# Patient Record
Sex: Female | Born: 1935
Health system: Southern US, Community
[De-identification: ages and names within clinical notes are randomized; demographics above are authoritative.]

## PROBLEM LIST (undated history)

## (undated) DIAGNOSIS — I4821 Permanent atrial fibrillation: Secondary | ICD-10-CM

## (undated) DIAGNOSIS — H269 Unspecified cataract: Secondary | ICD-10-CM

## (undated) DIAGNOSIS — I4891 Unspecified atrial fibrillation: Secondary | ICD-10-CM

## (undated) DIAGNOSIS — E039 Hypothyroidism, unspecified: Secondary | ICD-10-CM

## (undated) DIAGNOSIS — M1990 Unspecified osteoarthritis, unspecified site: Secondary | ICD-10-CM

## (undated) DIAGNOSIS — E079 Disorder of thyroid, unspecified: Secondary | ICD-10-CM

## (undated) HISTORY — DX: Hypothyroidism, unspecified: E03.9

## (undated) HISTORY — DX: Unspecified atrial fibrillation: I48.91

## (undated) HISTORY — DX: Unspecified cataract: H26.9

## (undated) HISTORY — DX: Unspecified osteoarthritis, unspecified site: M19.90

## (undated) HISTORY — DX: Disorder of thyroid, unspecified: E07.9

## (undated) HISTORY — DX: Permanent atrial fibrillation: I48.21

## (undated) HISTORY — PX: JOINT REPLACEMENT: SHX530

## (undated) HISTORY — PX: OTHER SURGICAL HISTORY: SHX169

---

## 1998-06-11 ENCOUNTER — Encounter: Payer: Self-pay | Admitting: Internal Medicine

## 1999-05-27 ENCOUNTER — Encounter: Admission: RE | Admit: 1999-05-27 | Discharge: 1999-05-27 | Payer: Self-pay | Admitting: Family Medicine

## 1999-05-27 ENCOUNTER — Encounter: Payer: Self-pay | Admitting: Family Medicine

## 2000-04-04 ENCOUNTER — Encounter: Payer: Self-pay | Admitting: Orthopedic Surgery

## 2000-04-04 ENCOUNTER — Encounter: Admission: RE | Admit: 2000-04-04 | Discharge: 2000-04-04 | Payer: Self-pay | Admitting: Orthopedic Surgery

## 2000-04-06 ENCOUNTER — Ambulatory Visit (HOSPITAL_BASED_OUTPATIENT_CLINIC_OR_DEPARTMENT_OTHER): Admission: RE | Admit: 2000-04-06 | Discharge: 2000-04-06 | Payer: Self-pay | Admitting: Orthopedic Surgery

## 2000-05-22 HISTORY — PX: KNEE SURGERY: SHX244

## 2000-06-27 ENCOUNTER — Encounter: Admission: RE | Admit: 2000-06-27 | Discharge: 2000-06-27 | Payer: Self-pay | Admitting: *Deleted

## 2000-07-19 ENCOUNTER — Encounter: Payer: Self-pay | Admitting: Orthopedic Surgery

## 2000-07-20 ENCOUNTER — Encounter: Payer: Self-pay | Admitting: Orthopedic Surgery

## 2000-07-20 ENCOUNTER — Ambulatory Visit (HOSPITAL_COMMUNITY): Admission: RE | Admit: 2000-07-20 | Discharge: 2000-07-20 | Payer: Self-pay | Admitting: Orthopedic Surgery

## 2000-07-23 ENCOUNTER — Inpatient Hospital Stay (HOSPITAL_COMMUNITY): Admission: RE | Admit: 2000-07-23 | Discharge: 2000-07-27 | Payer: Self-pay | Admitting: Orthopedic Surgery

## 2001-05-22 HISTORY — PX: TOTAL SHOULDER REPLACEMENT: SUR1217

## 2001-05-22 LAB — HM COLONOSCOPY

## 2001-06-14 ENCOUNTER — Inpatient Hospital Stay (HOSPITAL_COMMUNITY): Admission: RE | Admit: 2001-06-14 | Discharge: 2001-06-17 | Payer: Self-pay | Admitting: Orthopedic Surgery

## 2001-06-14 ENCOUNTER — Encounter: Payer: Self-pay | Admitting: Orthopedic Surgery

## 2005-05-03 ENCOUNTER — Ambulatory Visit: Payer: Self-pay | Admitting: Gastroenterology

## 2005-05-10 ENCOUNTER — Encounter (INDEPENDENT_AMBULATORY_CARE_PROVIDER_SITE_OTHER): Payer: Self-pay | Admitting: Specialist

## 2005-05-10 ENCOUNTER — Ambulatory Visit: Payer: Self-pay | Admitting: Gastroenterology

## 2005-05-22 HISTORY — PX: TOTAL SHOULDER REPLACEMENT: SUR1217

## 2005-12-04 ENCOUNTER — Inpatient Hospital Stay (HOSPITAL_COMMUNITY): Admission: RE | Admit: 2005-12-04 | Discharge: 2005-12-06 | Payer: Self-pay | Admitting: Orthopedic Surgery

## 2006-05-22 HISTORY — PX: WRIST SURGERY: SHX841

## 2007-03-13 ENCOUNTER — Ambulatory Visit (HOSPITAL_BASED_OUTPATIENT_CLINIC_OR_DEPARTMENT_OTHER): Admission: RE | Admit: 2007-03-13 | Discharge: 2007-03-13 | Payer: Self-pay | Admitting: Orthopedic Surgery

## 2007-05-08 ENCOUNTER — Ambulatory Visit (HOSPITAL_BASED_OUTPATIENT_CLINIC_OR_DEPARTMENT_OTHER): Admission: RE | Admit: 2007-05-08 | Discharge: 2007-05-08 | Payer: Self-pay | Admitting: Orthopedic Surgery

## 2008-05-22 HISTORY — PX: CATARACT EXTRACTION, BILATERAL: SHX1313

## 2009-05-22 HISTORY — PX: TOTAL HIP ARTHROPLASTY: SHX124

## 2009-05-22 HISTORY — PX: OTHER SURGICAL HISTORY: SHX169

## 2009-09-29 ENCOUNTER — Inpatient Hospital Stay (HOSPITAL_COMMUNITY): Admission: RE | Admit: 2009-09-29 | Discharge: 2009-10-02 | Payer: Self-pay | Admitting: Orthopedic Surgery

## 2010-01-05 ENCOUNTER — Inpatient Hospital Stay (HOSPITAL_COMMUNITY): Admission: RE | Admit: 2010-01-05 | Discharge: 2010-01-08 | Payer: Self-pay | Admitting: Orthopedic Surgery

## 2010-08-04 LAB — CBC
HCT: 29.4 % — ABNORMAL LOW (ref 36.0–46.0)
Hemoglobin: 9.3 g/dL — ABNORMAL LOW (ref 12.0–15.0)
Hemoglobin: 9.5 g/dL — ABNORMAL LOW (ref 12.0–15.0)
MCH: 28.2 pg (ref 26.0–34.0)
MCV: 85.1 fL (ref 78.0–100.0)
MCV: 86 fL (ref 78.0–100.0)
MCV: 87.3 fL (ref 78.0–100.0)
Platelets: 267 10*3/uL (ref 150–400)
Platelets: 281 10*3/uL (ref 150–400)
RBC: 3.42 MIL/uL — ABNORMAL LOW (ref 3.87–5.11)
RDW: 14.4 % (ref 11.5–15.5)
WBC: 9.6 10*3/uL (ref 4.0–10.5)
WBC: 9.8 10*3/uL (ref 4.0–10.5)

## 2010-08-04 LAB — BASIC METABOLIC PANEL
CO2: 27 mEq/L (ref 19–32)
Calcium: 8.5 mg/dL (ref 8.4–10.5)
Calcium: 9.1 mg/dL (ref 8.4–10.5)
Creatinine, Ser: 0.87 mg/dL (ref 0.4–1.2)
GFR calc Af Amer: >60 mL/min (ref >60)
Glucose, Bld: 120 mg/dL — ABNORMAL HIGH (ref 70–99)
Glucose, Bld: 125 mg/dL — ABNORMAL HIGH (ref 70–99)
Potassium: 4.2 mEq/L (ref 3.5–5.1)
Sodium: 133 mEq/L — ABNORMAL LOW (ref 135–145)

## 2010-08-04 LAB — PROTIME-INR
INR: 1.14 (ref 0.00–1.49)
INR: 1.38 (ref 0.00–1.49)
Prothrombin Time: 20.4 seconds — ABNORMAL HIGH (ref 11.6–15.2)

## 2010-08-05 LAB — DIFFERENTIAL
Basophils Absolute: 0.1 10*3/uL (ref 0.0–0.1)
Basophils Relative: 1 % (ref 0–1)
Lymphs Abs: 1.6 10*3/uL (ref 0.7–4.0)
Monocytes Relative: 13 % — ABNORMAL HIGH (ref 3–12)
Neutro Abs: 5.1 10*3/uL (ref 1.7–7.7)

## 2010-08-05 LAB — TYPE AND SCREEN: Antibody Screen: NEGATIVE

## 2010-08-05 LAB — COMPREHENSIVE METABOLIC PANEL
AST: 18 U/L (ref 0–37)
Albumin: 3.8 g/dL (ref 3.5–5.2)
Alkaline Phosphatase: 85 U/L (ref 39–117)
BUN: 13 mg/dL (ref 6–23)
Calcium: 9.8 mg/dL (ref 8.4–10.5)
GFR calc Af Amer: >60 mL/min (ref >60)
Glucose, Bld: 95 mg/dL (ref 70–99)
Sodium: 135 mEq/L (ref 135–145)
Total Protein: 6.7 g/dL (ref 6.0–8.3)

## 2010-08-05 LAB — CBC
HCT: 42.6 % (ref 36.0–46.0)
MCH: 28.3 pg (ref 26.0–34.0)
MCHC: 32.4 g/dL (ref 30.0–36.0)

## 2010-08-05 LAB — SURGICAL PCR SCREEN: MRSA, PCR: NEGATIVE

## 2010-08-05 LAB — URINE MICROSCOPIC-ADD ON

## 2010-08-05 LAB — URINALYSIS, ROUTINE W REFLEX MICROSCOPIC

## 2010-08-05 LAB — PROTIME-INR: Prothrombin Time: 12.9 seconds (ref 11.6–15.2)

## 2010-08-09 LAB — BASIC METABOLIC PANEL
BUN: 5 mg/dL — ABNORMAL LOW (ref 6–23)
CO2: 29 mEq/L (ref 19–32)
CO2: 30 mEq/L (ref 19–32)
Calcium: 8.3 mg/dL — ABNORMAL LOW (ref 8.4–10.5)
Calcium: 8.8 mg/dL (ref 8.4–10.5)
Chloride: 100 mEq/L (ref 96–112)
Creatinine, Ser: 0.63 mg/dL (ref 0.4–1.2)
Creatinine, Ser: 0.66 mg/dL (ref 0.4–1.2)
Creatinine, Ser: 0.77 mg/dL (ref 0.4–1.2)
GFR calc Af Amer: >60 mL/min (ref >60)
GFR calc Af Amer: >60 mL/min (ref >60)
GFR calc Af Amer: >60 mL/min (ref >60)
GFR calc non Af Amer: >60 mL/min (ref >60)
GFR calc non Af Amer: >60 mL/min (ref >60)
Glucose, Bld: 134 mg/dL — ABNORMAL HIGH (ref 70–99)
Potassium: 3.8 mEq/L (ref 3.5–5.1)
Sodium: 131 mEq/L — ABNORMAL LOW (ref 135–145)
Sodium: 135 mEq/L (ref 135–145)
Sodium: 135 mEq/L (ref 135–145)

## 2010-08-09 LAB — CBC
HCT: 25.9 % — ABNORMAL LOW (ref 36.0–46.0)
HCT: 28.8 % — ABNORMAL LOW (ref 36.0–46.0)
HCT: 31.4 % — ABNORMAL LOW (ref 36.0–46.0)
HCT: 41.6 % (ref 36.0–46.0)
Hemoglobin: 10.5 g/dL — ABNORMAL LOW (ref 12.0–15.0)
Hemoglobin: 14.2 g/dL (ref 12.0–15.0)
Hemoglobin: 8.8 g/dL — ABNORMAL LOW (ref 12.0–15.0)
Hemoglobin: 9.8 g/dL — ABNORMAL LOW (ref 12.0–15.0)
MCHC: 33.6 g/dL (ref 30.0–36.0)
MCHC: 33.8 g/dL (ref 30.0–36.0)
MCHC: 34.2 g/dL (ref 30.0–36.0)
MCHC: 34.2 g/dL (ref 30.0–36.0)
MCV: 92 fL (ref 78.0–100.0)
MCV: 92.8 fL (ref 78.0–100.0)
MCV: 93 fL (ref 78.0–100.0)
MCV: 93.2 fL (ref 78.0–100.0)
Platelets: 258 10*3/uL (ref 150–400)
Platelets: 267 10*3/uL (ref 150–400)
Platelets: 300 10*3/uL (ref 150–400)
Platelets: 364 10*3/uL (ref 150–400)
RBC: 2.79 MIL/uL — ABNORMAL LOW (ref 3.87–5.11)
RBC: 3.09 MIL/uL — ABNORMAL LOW (ref 3.87–5.11)
RBC: 3.38 MIL/uL — ABNORMAL LOW (ref 3.87–5.11)
RBC: 4.52 MIL/uL (ref 3.87–5.11)
RDW: 13.3 % (ref 11.5–15.5)
RDW: 13.3 % (ref 11.5–15.5)
RDW: 13.5 % (ref 11.5–15.5)
RDW: 13.8 % (ref 11.5–15.5)
WBC: 10.7 10*3/uL — ABNORMAL HIGH (ref 4.0–10.5)
WBC: 8.3 10*3/uL (ref 4.0–10.5)
WBC: 9.2 10*3/uL (ref 4.0–10.5)
WBC: 9.4 10*3/uL (ref 4.0–10.5)

## 2010-08-09 LAB — COMPREHENSIVE METABOLIC PANEL
ALT: 12 U/L (ref 0–35)
AST: 20 U/L (ref 0–37)
Albumin: 4.1 g/dL (ref 3.5–5.2)
Alkaline Phosphatase: 76 U/L (ref 39–117)
BUN: 12 mg/dL (ref 6–23)
CO2: 27 mEq/L (ref 19–32)
Calcium: 9.9 mg/dL (ref 8.4–10.5)
Chloride: 103 mEq/L (ref 96–112)
Creatinine, Ser: 0.8 mg/dL (ref 0.4–1.2)
GFR calc Af Amer: >60 mL/min (ref >60)
GFR calc non Af Amer: >60 mL/min (ref >60)
Glucose, Bld: 87 mg/dL (ref 70–99)
Potassium: 4.4 mEq/L (ref 3.5–5.1)
Sodium: 138 mEq/L (ref 135–145)
Total Bilirubin: 0.5 mg/dL (ref 0.3–1.2)
Total Protein: 6.7 g/dL (ref 6.0–8.3)

## 2010-08-09 LAB — URINALYSIS, ROUTINE W REFLEX MICROSCOPIC
Bilirubin Urine: NEGATIVE
Glucose, UA: NEGATIVE mg/dL
Hgb urine dipstick: NEGATIVE
Ketones, ur: NEGATIVE mg/dL
Nitrite: NEGATIVE
Protein, ur: NEGATIVE mg/dL
Specific Gravity, Urine: 1.012 (ref 1.005–1.030)
Urobilinogen, UA: 0.2 mg/dL (ref 0.0–1.0)
pH: 5.5 (ref 5.0–8.0)

## 2010-08-09 LAB — DIFFERENTIAL
Basophils Absolute: 0.1 10*3/uL (ref 0.0–0.1)
Basophils Relative: 1 % (ref 0–1)
Eosinophils Absolute: 0.3 10*3/uL (ref 0.0–0.7)
Eosinophils Relative: 4 % (ref 0–5)
Lymphocytes Relative: 29 % (ref 12–46)
Lymphs Abs: 2.4 10*3/uL (ref 0.7–4.0)
Monocytes Absolute: 0.7 10*3/uL (ref 0.1–1.0)
Monocytes Relative: 9 % (ref 3–12)
Neutro Abs: 4.8 10*3/uL (ref 1.7–7.7)
Neutrophils Relative %: 58 % (ref 43–77)

## 2010-08-09 LAB — TYPE AND SCREEN
ABO/RH(D): O POS
Antibody Screen: NEGATIVE

## 2010-08-09 LAB — URINE MICROSCOPIC-ADD ON

## 2010-08-09 LAB — PROTIME-INR
INR: 1.12 (ref 0.00–1.49)
Prothrombin Time: 14.3 seconds (ref 11.6–15.2)
Prothrombin Time: 17.2 seconds — ABNORMAL HIGH (ref 11.6–15.2)

## 2010-08-09 LAB — APTT: aPTT: 32 seconds (ref 24–37)

## 2010-08-09 LAB — GLUCOSE, CAPILLARY: Glucose-Capillary: 118 mg/dL — ABNORMAL HIGH (ref 70–99)

## 2010-10-04 NOTE — Op Note (Signed)
NAME:  Katrina Velez, Katrina Velez NO.:  [id number]   MEDICAL RECORD NO.:  [id number]          PATIENT TYPE:  AMB   LOCATION:  DSC                          FACILITY:  [facility]   PHYSICIAN:  Harvie Junior, M.D.   DATE OF BIRTH:  [birth year]   DATE OF PROCEDURE:  DATE OF DISCHARGE:                               OPERATIVE REPORT   PREOPERATIVE DIAGNOSIS:  Severe bilateral carpal tunnel syndrome.   POSTOPERATIVE DIAGNOSIS:  Severe bilateral carpal tunnel syndrome.   PROCEDURE:  Right carpal tunnel release.   SURGEON:  Harvie Junior, M.D.   ASSISTANT:  Marshia Ly, P.A.   ANESTHESIA:  Forearm based IV regional.   BRIEF HISTORY:  Ms. Hensarling is a 77 year old female with a long history  of having had significant and severe bilateral carpal tunnel symptoms.  She ultimately underwent EMG which showed she had severe and profound  compression of the median nerve at the wrist.  Because of her symptoms  and positive EMG, she ultimately felt that carpal tunnel release was the  most appropriate course of action.  She is brought to the operating room  for this procedure.   PROCEDURE:  Patient was brought to the operating room after adequate  anesthesia with a forearm based IV regional.  Patient was placed supine  on the operating room table.  The right arm was prepped and draped in  the usual sterile fashion.  Following this, a small curved incision was  made just ulnar to the midline and wrist crease.  Subcutaneous tissue  was taken down to the level of the volar carpal ligament.  It was  quickly identified and divided in line with the fibers.  A Freer  elevator was placed through this small rent to make sure the nerve was  not adherent to the ligament and the ligament was released distally and  proximally.  A gloved finger could be placed in the wound proximally and  distally.  At this point, the median nerve was identified and the motor  branch of the median nerve was identified.   The wound was copiously and  thoroughly irrigated at this point and suctioned dry.  A small  tenosynovectomy was performed on the ulnar side of the median nerve just  to allow additional freedom for the nerve.  At this point, the wound was  copiously and thoroughly irrigated and suctioned dry.  The wound was  then closed with a combination of interrupted and running sutures.  A  sterile compression dressing was applied, as well as a volar plaster.  The patient was taken to recovery where she was noted to be in  satisfactory condition.  Estimated blood loss for the procedure was  none.      Harvie Junior, M.D.  Electronically Signed    JLG/MEDQ  D:  03/13/2007  T:  03/13/2007  Job:  [id number]

## 2010-10-04 NOTE — Op Note (Signed)
NAMEMAKARIA, POARCH NO.:  [id number]   MEDICAL RECORD NO.:  [id number]          PATIENT TYPE:  AMB   LOCATION:  DSC                          FACILITY:  [facility]   PHYSICIAN:  Harvie Junior, M.D.   DATE OF BIRTH:  02-10-1926   DATE OF PROCEDURE:  05/08/2007  DATE OF DISCHARGE:  05/08/2007                               OPERATIVE REPORT   PREOPERATIVE DIAGNOSIS:  Carpal tunnel syndrome, left.   POSTOPERATIVE DIAGNOSIS:  Carpal tunnel syndrome, left.   OPERATION PERFORMED:  Left carpal tunnel release.   SURGEON:  Harvie Junior, M.D.   ANESTHESIA:  Forearm based IV regional.   INDICATIONS FOR PROCEDURE:  Ms. Rotunno is a 77 year old female with a  long history of having had bilateral carpal tunnel syndrome severe.  We  had treated her conservatively for a period of time and then ultimately  did a right carpal tunnel release.  She had done well with this and  because of continued complaints of pain on the left side, she is brought  to the operating room for left carpal tunnel release.   DESCRIPTION OF PROCEDURE:  The patient was taken to the operating room.  After adequate anesthesia was obtained with forearm based IV regional,  patient placed supine on operating table.  Left arm prepped and draped  in the usual sterile fashion.  Following this, small incision made just  ulnar to the midline wrist through subcutaneous tissue, dissected down  to level of volar carpal ligament, clearly identified.  Small rent was  made.  Freer elevator was used to make sure the nerve was not adherent  on the undersurface and the ligament was divided proximally and  distally. A  gloved finger could be placed in the wound.  Proximal and  distal the nerve was then identified.  The motor branch of the median  nerve identified.  The wound was copiously and thoroughly irrigated,  suctioned dry.  The skin was closed with a combination of interrupted  and running suture 4-0 nylon.  Sterile  compressive dressing was applied  as well as a volar plaster and the patient was taken to recovery where  she was noted to be in satisfactory condition.  10 mL of 0.5% Marcaine  were instilled into the skin for postoperative anesthesia.  The  estimated blood loss for this procedure was none.   COMPLICATIONS:  None.      Harvie Junior, M.D.  Electronically Signed     JLG/MEDQ  D:  05/08/2007  T:  05/09/2007  Job:  [id number]

## 2010-10-07 NOTE — Op Note (Signed)
[facility]  Patient:    Katrina Velez, Katrina Velez Visit Number: [id number] MRN: [id number]          Service Type: SUR Location: [id number] Attending Physician:  Milly Jakob Dictated by:   Harvie Junior, M.D. Proc. Date: 06/14/01 Admit Date:  06/14/2001                             Operative Report  PREOPERATIVE DIAGNOSIS:  Degenerative joint disease of right shoulder.  POSTOPERATIVE DIAGNOSES: 1. Degenerative joint disease of right shoulder. 2. Degenerative biceps tendon.  PRINCIPAL PROCEDURE: 1. Total shoulder replacement, right with 52 three cemented peg glenoid and a    central Press-Fit peg, a size 10 Press-Fit body with a small 52 mm head. 2. Bicipital tenodesis in the bicipital groove soft tissue.  SURGEON:  Harvie Junior, M.D.  ASSISTANT:  Currie Paris. Thedore Mins.  ANESTHESIA:  General.  BRIEF HISTORY:  A 77 year old female with a long history of having degenerative joint disease of the right shoulder.  She ultimately had had pain off and on.  Injection therapy had helped initially, but ultimately because of continued complaints of pain, she was taken to the operating room for total shoulder replacement.  DESCRIPTION OF PROCEDURE:  The patient was taken to the operating room and after adequate anesthesia was obtained a general anesthetic, the patient was on the operating table.  The right shoulder was then prepped and draped in the usual sterile fashion.  She was moved in the beach chair position after being put to sleep, and then the right shoulder was prepped and draped in the usual sterile fashion.  Following this, a linear incision was made from the coracoid down to the insertion of the deltoid.  The subcutaneous tissue was dissected down to the level of the deltopectoral interval which was identified and exploited.  The cephalic vein going with the deltoid.  Underneath, the clavipectoral fascia was divided and a retractor was  put in place between the conjoined tendon and underneath the deltoid.  At this point, the subscapularis was then taken off of the lesser tuberosity for later reattachment at the humeral margin.  Following this, a cut was made in 35 degrees of retroversion of the humeral head, and once this cut was made, the trial bodies were put in place up to a size 10, and 10 reaming distally.  Once the trial bodies were put in place, attention was turned to the glenoid where the biceps tendon was identified and noted to be atretic and diseased.  The biceps tendon was then divided, and care being taken to identify the distal stalk of the biceps for later tenodesis.  At this point, a superior, anterior, and posterior labrectomy was performed, and the glenoid was identified.  The central portion of the glenoid with a 52 sizer which was the most appropriate size was used, and the central peg was drilled.  The three holes were then drilled around with a derotational plug being put in after the first hole was drilled.  Following drilling of this, the trial glenoid was put in place.  The arm was put through a range of motion.  Excellent range of motion was achieved. Attention was then turned to removal of all the components.  The glenoid was then cemented into place with the three peg and the central peg after a bone graft was put around the plastic portion of the  central peg.  During cementing, the cement did set up in the three holes very rapidly, and the component did not seat completely at that point.  The component had to be removed.  This was done before the cement set up.  The remaining cement in the holes was then overdrilled, and the real component was then put in place and seated fine.  Following this, the components were put in with a more liquid state with the cement, and once that was done, the component was able to be seated easily. Once that was achieved, the cement was allowed to harden.   Once the cement had hardened, attention was turned to the ______ size, where the 10 body was put in place.  The small head was then trialed 52 small and this gave excellent range of motion, 30-40 degrees of external rotation, and full overhead elevation could be achieved.  At that point, the subscapularis muscle was then reattached to the anterior leading edge of the humeral head.  The biceps tendon was then tenodesed into the bicipital groove with #2 Ethibond sutures to the soft tissue.  At this point, the wound was copiously irrigated and suctioned dry.  The arm was put through a range of motion, and could get easily 45 degrees of external rotation before there was tension on the subscapularis.  She could get overhead elevation to about 100 degrees.  At this point, the deltopectoral interval was allowed to pull together.  It was closed with a combination of 0 and 2-0 Vicryl, and the skin was closed with skin staples.  A medium Hemovac drain was put in place during the closure.  The estimated blood loss for the procedure was 500 cc. Dictated by:   Harvie Junior, M.D. Attending Physician:  Milly Jakob DD:  06/14/01 TD:  06/16/01 Job: [id number]

## 2010-10-07 NOTE — Op Note (Signed)
NAME:  Katrina Velez, Katrina Velez NO.:  [id number]   MEDICAL RECORD NO.:  [id number]          PATIENT TYPE:  INP   LOCATION:  5029                         FACILITY:  [facility]   PHYSICIAN:  Harvie Junior, M.D.   DATE OF BIRTH:  10-06-1925   DATE OF PROCEDURE:  12/04/2005  DATE OF DISCHARGE:                                 OPERATIVE REPORT   PREOPERATIVE DIAGNOSIS:  End stage degenerative joint disease left shoulder.   POSTOPERATIVE DIAGNOSIS:  End stage degenerative joint disease left  shoulder.   PROCEDURE:  Total shoulder replacement with a global shoulder system.  We  used a 48 mm all polyethylene pegged glenoid component.  We used a size 10  global shoulder body with a 48 mm central plus 15 head.   SURGEON:  Harvie Junior, M.D.   ASSISTANT:  Marshia Ly, P.A.   ANESTHESIA:  General.   INDICATIONS:  The patient is a 77 year old female with a long history of  having had significant bilateral shoulder problems. She ultimately had a  total shoulder done on the right side some years ago and got excellent  motion and mobility and did very well with that.  She continued to complain  of pain.  However, she presented more recently with pain in her left  shoulder.  We treated her conservatively, did a steroid injection, anti-  inflammatory medications, activity modification.  None of this was  successful.  Because of continued complaints of pain, we ultimately took her  to the operating room for hemi versus total shoulder.  She had done  wonderfully with her total shoulder on her left side and it was felt that  certainly it was reasonable to give consideration to doing total shoulder on  her left side if the glenoid component was reasonable and she was brought to  the operating room with this plan in mind.   PROCEDURE IN DETAIL:  The patient was brought to the operating room.  After  adequate anesthesia was obtained with general anesthetic, the patient was  placed on the  operating table and the left arm was prepped and draped in the  usual sterile fashion.  Following this a curved incision was made from the  coracoid down to the deltoid insertion and the subcutaneous tissue cut down  to the deltopectoral interval.  The deltopectoral interval was identified  and divided, care taken to retract the cephalic vein laterally with the  deltoid muscle.  This gave access to the clavipectoral fascia which was  divided on its lateral side and a self-retaining retractor was put in place.  The subscapularis muscle tendon unit was then identified and this was  divided 1 cm from its origin on the lesser tuberosity.  Preoperative  external rotation was 40 degrees.   At this point, the canal was identified and the arm and reamer was placed  down the central part of the arm and a cutting guide was used and 15 mm was  resected from the head with the cutting guide.  Once this had been  accomplished, the rasps were used after  the centering guide.  Once that was  used, the rasps were placed down and the file rasp was used and attention  was turned to the glenoid __________.   A circumferential capsulectomy was performed and once that was completed the  humeral head of the humerus was retracted posterior to the glenoid and the  glenoid was then sized to a 48.  Central hole was drilled.  This was reamed  and once it was completed being reamed, the central hole was drilled,  superior to inferior pegs were drilled.  Cementation was then undertaken in  those holes.  Bone chips were placed in and around the fins of the glenoid  component and the glenoid was then hammered into place and the cemented  system.  Thrombin with Gelfoam was used to keep the centering peg holes dry  prior to the cementation.   Once this was accomplished, the glenoid was hammered into place, held in  place while the cement was allowed to harden.  Once this had been  accomplished, attention was turned back  to the humeral side where the trial  body was removed.  The final 10 body was put in with some cancellous bone up  superiorly.  Excellent fit and fill was achieved.  The 48 mm +15 bowl was  then placed and this was put through a range of motion easy.  Over it  elevation was achieved.  At this point the wound was copiously irrigated and  suctioned dry.  The subscap muscle was repaired to the subscap origin.  This  was done with five figure-of-eight interrupted #1 Ethibond sutures.   At this point, the self-retaining retractor was removed.  Unfortunately, the  cephalic vein appeared to have been injured with the retraction and the  cephalic vein was tied off at this point.  The deltopectoral interval was  allowed to fall together and the subcu was closed with 0 and 2-0 Vicryl and  the skin with skin staples.  A sterile compressive dressing was applied as  well as shoulder sling.  The patient was taken to the recovery room and  noted to be in satisfactory condition.  Estimated blood loss for the  procedure was 250 cc.  Complications for the procedure were none.      Harvie Junior, M.D.  Electronically Signed     JLG/MEDQ  D:  12/04/2005  T:  12/05/2005  Job:  [id number]

## 2010-10-07 NOTE — Op Note (Signed)
. [facility]  Patient:    Katrina Velez, Katrina Velez                         MRN: [id number] Proc. Date: 04/06/00 Adm. Date:  [id number] Disc. Date: [id number] Attending:  Milly Jakob                           Operative Report  PREOPERATIVE DIAGNOSIS:  Lateral meniscal tear with degenerative changes lateral compartment.  POSTOPERATIVE DIAGNOSES: 1. Lateral meniscal tear with degenerative changes lateral compartment. 2. Anterior horn medial meniscal tear with degenerative changes medial    compartment. 3. Degenerative joint disease of the patellofemoral compartment.  PROCEDURES: 1. Debridement of lateral meniscal tear with significant debridement of    osteochondral defect and articular cartilage fraying in the lateral femoral    compartment. 2. Debridement of anterior horn medial meniscal tear with debridement of    medial femoral condyle and tibial plateau. 3. Debridement of patellofemoral joint.  SURGEON:  Harvie Junior, M.D.  Threasa HeadsOrma Flaming.  ANESTHESIA:  Local.  BRIEF HISTORY:  This is a 77 year old female with a long history of having significant joint pain. She had x-rays which showed bone on bone on the lateral side. However, she had had a change in her symptoms with more sharp intermittent type pain, and, after a long discussion about options of joint replacement versus arthroscopy, she felt that arthroscopy would be less invasive and give her possible chance at good success. She was brought to the operating room for this procedure.  DESCRIPTION OF PROCEDURE:  The patient was brought to the operating room and after adequate anesthesia was obtained with a local block the patient was placed on the operating table. The left leg was the prepped and draped in the usual sterile fashion. Following this routine examination of the knee revealed there was some grade II and III changes in the medial femoral condyle. There was an anterior horn  medial meniscal tear, which was debrided with the suction shaver, back to a stable rim.  Attention was then turned to the notch where she was noted to have an intact anterior cruciate ligament. Attention was then turned laterally where she had a large osteochondral defect on the end of the femur. This measured probably 4 cm x 3 cm. She had a large area of exposed bone on the tibial plateau, which, again measured probably 4 x 4 cm. Following debridement of the edges of these defects, attention was turned to the meniscus where there was a complex tear of the posterior horn, which flipped both medially and laterally. The popliteus tendon was exposed in the knee joint. Following this the meniscal tear was debrided. The lateral compartment was completely debrided. Attention was turned up into the patellofemoral compartment. The undersurface of the patella was debrided for grade II and III change, and some significant synovitis was removed in the area of the patellofemoral compartment. Following this the knee was copiously irrigated and suctioned dry. One final check was made for any loose or fragmenting pieces. Following this attention was turned towards putting some Steri-Strips over her wound. A sterile compressive dressing was applied and she was taken to the recovery room where she was noted to be in satisfactory condition. DD:  04/06/00 TD:  04/06/00 Job: [id number]

## 2010-10-07 NOTE — Discharge Summary (Signed)
[place]  Patient:    Katrina Velez, Katrina Velez                         MRN: [id number] Adm. Date:  [id number] Disc. Date: [id number] Attending:  Milly Jakob Dictator:   Currie Paris. Thedore Mins. CC:         Lilia Pro, M.D.   Discharge Summary  ADMISSION DIAGNOSES: 1. End-stage degenerative joint disease left knee. 2. Hypothyroidism. 3. Tobacco abuse.  DISCHARGE DIAGNOSES: 1. End-stage degenerative joint disease left knee. 2. Hypothyroidism. 3. Tobacco abuse.  PROCEDURES:  Left total knee arthroplasty, Milly Jakob, M.D., July 23, 2000.  HISTORY OF PRESENT ILLNESS:  Katrina Velez is a pleasant 77 year old white female who has a long history of left knee pain.  Standing x-rays of the left knee showed bone on bone.  She got no relief with cortisone injection, anti-inflammatories, and a knee arthroscopy with debridement of DJD.  She continued to have night pain and pain with ambulation that did not get relieved with exhaustive conservative management.  Therefore, she was admitted for a left total knee arthroplasty.  LABORATORY DATA:  CT scan of the chest showed no lung or significant bony abnormality demonstrated corresponding to the questioned abnormality seen on previous chest x-ray.  Hemoglobin on admission was 14.1, hematocrit 42.3.  On postoperative day #1 her hemoglobin was ______ , on postoperative day #2 it was 9.2.  On postoperative day #3 hemoglobin was 8.0 with hematocrit of 24.1.  Indices were within normal limits.  Positive for occult blood in the stool on July 24, 2000.  Protime on admission was 12 seconds with an INR of 0.9.  PTT was 27. On the date of discharge her protime was 20.5 seconds with an INR of 2.3. CMET on admission was within normal limits other than an elevated glucose of 136.  BMET on postoperative day #1 was within normal limits other than an elevated glucose of 154.  Urinalysis on admission was  unremarkable.  HOSPITAL COURSE:  The patient underwent left total knee arthroplasty as well described in Dr. Luiz Blare operative note.  She was put on a PCA morphine pump for pain control, put on Ancef 1 g IV q.8h. x 48 hours prophylactically.  On postoperative day #1 she had no complaints.  She had a hemoglobin of 10.8, and her INR was 1.0.  She had some nausea.  Her gastric results were positive, and Pepcid was added.  Symptoms did improve.  Nausea, vomiting, and abdominal discomfort resolved.  On postoperative day #2 her dressing was changed.  Her t-max was 101, her vital signs were stable.  Her hemoglobin was 9.2.  Her left lower extremity had normal neurovascular status.  Her dressing was changed, and her Hemovac drain was pulled.  Her INR was 1.4.  On postoperative day #3 her hemoglobin was 8.0, hematocrit 24.1.  Protime was 17.2 seconds.  She had no complaints.  Her vital signs were stable, she was afebrile.  She was ambulating well with physical therapy.  She had no shortness of breath or chest pain or any abdominal pain at this time.  She continued on Coumadin antithrombolytic therapy without any bleeding or complications noted.  On postoperative day #4 she was discharged home.  She had no complaints.  She was taking fluids and voiding without difficulty.  She was doing well with physical therapy.  Her vital signs were stable, she was afebrile.  The left knee  range of motion was 0 degrees to 90 degrees.  Her calf was soft.  NV was intact distally.  Her INR was 2.3.  She was discharged home in improved condition, was on a regular diet.  She was given a prescription for Tylox p.r.n. for pain; Coumadin 5 mg q.d. #30, 1 q.d. taken as directed unless otherwise directed.  She was instructed to ambulate weightbearing as tolerated on the left with a walker.  She will get home health physical therapy and home health protimes to monitor her Coumadin levels.  She will follow up in two weeks  with Dr. Luiz Blare. DD:  09/12/00 TD:  09/14/00 Job: [id number]

## 2010-10-07 NOTE — Op Note (Signed)
[place]. [facility]  Patient:    Katrina Velez, Katrina Velez                         MRN: [id number] Proc. Date: 07/24/00 Attending:  Harvie Junior, M.D.                           Operative Report  PREOPERATIVE DIAGNOSIS:  Degenerative joint disease, left knee.  POSTOPERATIVE DIAGNOSIS:  Degenerative joint disease, left knee.  OPERATION PERFORMED:  Left total knee replacement.  SURGEON:  Harvie Junior, M.D.  ASSISTANT: 1. Alinda Deem, M.D. 2. Currie Paris. Thedore Mins.  ANESTHESIA:  General.  INDICATIONS FOR PROCEDURE:  The patient is a 77 year old female with a long history of having severe degenerative joint disease, left knee.  She had been followed longterm with multiple steroid injections, anti-inflammatories and activity modification.  Because of failure of all conservative care and x-rays that showed bone-on-bone degenerative arthritis, we brought her to the operating room for total knee replacement.  DESCRIPTION OF PROCEDURE:  The patient was brought to the operating room and after adequate anesthesia was obtained with a general anesthetic, the patient was placed supine on the operating table, the left leg was than prepped and draped in the usual sterile fashion.  Following this, a midline incision was made and subcutaneous tissues dissected down to the level of the extensor mechanism.  The extensor mechanism was ____________ for a median parapatellar arthrotomy and the knee was then flexed.  The medial and lateral meniscus was then removed as well as the patellar fat pad.  The anterior and posterior cruciates were then removed.  Attention at this time was turned towards the long alignment where an intramedullary device was used as well as an extramedullary device and the tibia was then cut perpendicular to the long axis of the tibia.  At this point a centering hole was made into the femur and a guide was used to place the rotational alignment guide  on the femur.  This was pinned in place and the anterior and posterior cuts were made on the femur.  The lollipop guide was then used to ensure that there was 12 mm of space between the flexion gap.  At this point attention was turned towards the long alignment and the extension gap was measured and cut.  A 12.5 mm lollipop cut be fit into the leg with the knee in full extension.  At this point the chamfer guide was put in place and anterior-posterior chamfer cuts were made as well as the box cut.  The trial femur was then put in place.  The tibia was then sized at a size 3 and it was pinned and pins were placed as well as the component and the trial reduction was undertaken.  Excellent alignment was achieved with a 12.5 mm spacing device and with easy full extension, no instability.  At this point the patella was cut.  It only measured 20 mm of patella but it was feared to be cut past 12.  The patient was cut to 13 mm and then a guide was used to make three peg holes and the patella was put in place.  At this point a trial patella was put in place.  The knee was put through a range of motion and excellent stability was achieved.  At this point all components were removed.  The knee was copiously  irrigated and suctioned dry.  The three components were cemented into place and a standard femur, 12.5 mm polyethylene bridging bearing a size #3 tibial component, a 12.5 mm insert, a standard patella, these were all cemented into place.  The medium Hemovac drain was then placed.  The knee was then copiously irrigated and then suctioned dry.  The medial parapatellar arthrotomy was then closed with #1 Vicryl running suture.  The skin was closed with a combination of 2-0 and a 4-0 Monocryl suture.  Benzoin and Steri-Strips were applied.  A sterile compressive dressing was applied.  The patient was taken to the recovery room where she was noted to be in satisfactory condition.  Estimated blood loss  was none. DD:  07/23/00 TD:  07/24/00 Job: [id number]

## 2010-10-07 NOTE — Discharge Summary (Signed)
[place]. [facility]  Patient:    Katrina Velez, Katrina Velez Visit Number: [id number] MRN: [id number]          Service Type: SUR Location: [id number] Attending Physician:  Milly Jakob Dictated by:   Currie Paris Bethune, P.A.-C. Admit Date:  06/14/2001 Discharge Date: 06/17/2001   CC:         [name], M.D.  Harvie Junior, M.D.   Discharge Summary  ADMITTING DIAGNOSES: 1. Degenerative joint disease right shoulder. 2. Hypothyroidism. 3. Status post left total knee replacement.  DISCHARGE DIAGNOSES: 1. Degenerative joint disease right shoulder. 2. Hypothyroidism. 3. Status post left total knee replacement.  PROCEDURES:  Right total shoulder arthroplasty by Dr. Harvie Junior.  HISTORY OF PRESENT ILLNESS:  Katrina Velez is a 77 year old patient well known to [place].  She has a long history of right shoulder pain.  She had progressive pain in her shoulder, stiffness, and limitation of motion.  Plane x-rays of the right shoulder showed severe glenohumeral joint arthritis.  She had pain at night and pain with use of her right arm.  She got no relief with exhaustive conservative treatment and was felt to be a candidate for a right total shoulder arthroplasty based on a radiographic and clinical findings.  She is admitted for this.  LABORATORY DATA:  EKG on admission showed normal sinus rhythm, normal EKG. Preoperative chest x-ray had no active lung disease.  There was an area of increased density on the lateral view overlying the thoracic spine in the region of the pedicle.  This may be bony in origin and could represent metastatic lesion.  Suggestion was made for comparison with a prior chest x-ray or a CT scan to this region to evaluate further.  Postoperative x-rays of the right shoulder showed near anatomic alignment of her right shoulder arthroplasty.  Hemoglobin on admission was 13.6, hematocrit 40.1.  Indices were within normal limits.  Pro time was 13.2  seconds with an INR of 1.0 and a PTT of 30.  A CMET on admission was entirely within normal limits.  On postoperative day one, her hemoglobin was 10.0, hematocrit 29.2. Urinalysis on admission was unremarkable.  HOSPITAL COURSE:  The patient underwent right total shoulder arthroplasty as well described by Dr. Harvie Junior operative note on June 14, 2001.  The patient was placed in a sling and was put on Ancef 1 gm IV q.8h. x 6 doses. She was put on Morphine pump for pain control.  Physical therapy was ordered for passive range of motion including forward flexion and to the right shoulder.  On postoperative day one, the patient had good neurovascular status to the right upper extremity.  The Hemovac drain, which had been placed at the time of surgery, was in place.  PCA Morphine pump was working for pain control.  The patient did have an episode of significant vomiting.  O2 saturations were decreased to 67% on room air and nasal O2 was started.  Phenergan was ordered. Narcan was given.  On postoperative day two, the patient looked great.  She was voiding without difficulty.  She was taking p.o. analgesics for pain control.  She spiked a fever up to 101 and was then found to be afebrile.  Her right shoulder dressing was changed and her Hemovac drain was pulled.  On June 17, 2001, postoperative day three, the patient was doing well.  She was without complaints.  Her vital signs were stable.  She was afebrile.  Her lungs were clear and her right upper extremity had normal neurovascular status.  She had benign surgical wound at that time.  The patient was discharged home in improved condition.  DISCHARGE MEDICATIONS: 1. Given orders for Percocet p.r.n. pain. 2. Keflex 500 mg t.i.d. x 1 week.  DISCHARGE INSTRUCTIONS:  We encouraged shoulder range of motion mostly being forward flexion to 90 degrees as tolerated and suggested that she avoided external rotation.  She will  follow up with Dr. Harvie Junior in 10 days. Call sooner if any problems occur. Dictated by:   Currie Paris Bethune, P.A.-C. Attending Physician:  Milly Jakob DD:  07/05/01 TD:  07/05/01 Job: [id number]

## 2011-03-01 LAB — POCT HEMOGLOBIN-HEMACUE: Hemoglobin: 13.1

## 2011-05-25 ENCOUNTER — Ambulatory Visit (INDEPENDENT_AMBULATORY_CARE_PROVIDER_SITE_OTHER): Payer: Medicare Other

## 2011-05-25 DIAGNOSIS — J9801 Acute bronchospasm: Secondary | ICD-10-CM

## 2011-05-25 DIAGNOSIS — F17200 Nicotine dependence, unspecified, uncomplicated: Secondary | ICD-10-CM | POA: Diagnosis not present

## 2011-05-25 DIAGNOSIS — J111 Influenza due to unidentified influenza virus with other respiratory manifestations: Secondary | ICD-10-CM | POA: Diagnosis not present

## 2011-05-25 DIAGNOSIS — J09X2 Influenza due to identified novel influenza A virus with other respiratory manifestations: Secondary | ICD-10-CM | POA: Diagnosis not present

## 2011-05-25 DIAGNOSIS — J189 Pneumonia, unspecified organism: Secondary | ICD-10-CM | POA: Diagnosis not present

## 2011-05-25 DIAGNOSIS — J159 Unspecified bacterial pneumonia: Secondary | ICD-10-CM

## 2011-05-26 ENCOUNTER — Ambulatory Visit (INDEPENDENT_AMBULATORY_CARE_PROVIDER_SITE_OTHER): Payer: Medicare Other

## 2011-05-26 DIAGNOSIS — J159 Unspecified bacterial pneumonia: Secondary | ICD-10-CM | POA: Diagnosis not present

## 2011-05-26 DIAGNOSIS — J111 Influenza due to unidentified influenza virus with other respiratory manifestations: Secondary | ICD-10-CM | POA: Diagnosis not present

## 2011-05-26 DIAGNOSIS — J189 Pneumonia, unspecified organism: Secondary | ICD-10-CM

## 2011-05-26 DIAGNOSIS — J09X2 Influenza due to identified novel influenza A virus with other respiratory manifestations: Secondary | ICD-10-CM | POA: Diagnosis not present

## 2011-05-26 DIAGNOSIS — F17200 Nicotine dependence, unspecified, uncomplicated: Secondary | ICD-10-CM | POA: Diagnosis not present

## 2011-05-26 DIAGNOSIS — J9801 Acute bronchospasm: Secondary | ICD-10-CM | POA: Diagnosis not present

## 2011-05-28 ENCOUNTER — Ambulatory Visit (INDEPENDENT_AMBULATORY_CARE_PROVIDER_SITE_OTHER): Payer: Medicare Other

## 2011-05-28 DIAGNOSIS — F17200 Nicotine dependence, unspecified, uncomplicated: Secondary | ICD-10-CM | POA: Diagnosis not present

## 2011-05-28 DIAGNOSIS — J159 Unspecified bacterial pneumonia: Secondary | ICD-10-CM | POA: Diagnosis not present

## 2011-05-28 DIAGNOSIS — J9801 Acute bronchospasm: Secondary | ICD-10-CM | POA: Diagnosis not present

## 2011-05-28 DIAGNOSIS — J111 Influenza due to unidentified influenza virus with other respiratory manifestations: Secondary | ICD-10-CM | POA: Diagnosis not present

## 2011-05-28 DIAGNOSIS — J189 Pneumonia, unspecified organism: Secondary | ICD-10-CM | POA: Diagnosis not present

## 2011-05-28 DIAGNOSIS — J09X2 Influenza due to identified novel influenza A virus with other respiratory manifestations: Secondary | ICD-10-CM | POA: Diagnosis not present

## 2011-05-30 ENCOUNTER — Ambulatory Visit (INDEPENDENT_AMBULATORY_CARE_PROVIDER_SITE_OTHER): Payer: Medicare Other

## 2011-05-30 DIAGNOSIS — J111 Influenza due to unidentified influenza virus with other respiratory manifestations: Secondary | ICD-10-CM | POA: Diagnosis not present

## 2011-05-30 DIAGNOSIS — J189 Pneumonia, unspecified organism: Secondary | ICD-10-CM

## 2011-05-30 DIAGNOSIS — J9801 Acute bronchospasm: Secondary | ICD-10-CM

## 2011-06-06 ENCOUNTER — Ambulatory Visit (INDEPENDENT_AMBULATORY_CARE_PROVIDER_SITE_OTHER): Payer: Medicare Other

## 2011-06-06 DIAGNOSIS — J189 Pneumonia, unspecified organism: Secondary | ICD-10-CM | POA: Diagnosis not present

## 2011-06-06 DIAGNOSIS — R05 Cough: Secondary | ICD-10-CM | POA: Diagnosis not present

## 2011-07-05 DIAGNOSIS — H31009 Unspecified chorioretinal scars, unspecified eye: Secondary | ICD-10-CM | POA: Diagnosis not present

## 2011-07-05 DIAGNOSIS — Z961 Presence of intraocular lens: Secondary | ICD-10-CM | POA: Diagnosis not present

## 2011-07-05 DIAGNOSIS — H0230 Blepharochalasis unspecified eye, unspecified eyelid: Secondary | ICD-10-CM | POA: Diagnosis not present

## 2011-07-05 DIAGNOSIS — H01139 Eczematous dermatitis of unspecified eye, unspecified eyelid: Secondary | ICD-10-CM | POA: Diagnosis not present

## 2011-09-12 ENCOUNTER — Ambulatory Visit: Payer: Self-pay | Admitting: Emergency Medicine

## 2011-12-12 DIAGNOSIS — H04129 Dry eye syndrome of unspecified lacrimal gland: Secondary | ICD-10-CM | POA: Diagnosis not present

## 2011-12-12 DIAGNOSIS — Z961 Presence of intraocular lens: Secondary | ICD-10-CM | POA: Diagnosis not present

## 2011-12-12 DIAGNOSIS — H10409 Unspecified chronic conjunctivitis, unspecified eye: Secondary | ICD-10-CM | POA: Diagnosis not present

## 2011-12-12 DIAGNOSIS — H4320 Crystalline deposits in vitreous body, unspecified eye: Secondary | ICD-10-CM | POA: Diagnosis not present

## 2012-01-31 ENCOUNTER — Ambulatory Visit (INDEPENDENT_AMBULATORY_CARE_PROVIDER_SITE_OTHER): Payer: Medicare Other | Admitting: Family Medicine

## 2012-01-31 VITALS — BP 146/70 | HR 65 | Temp 97.6°F | Resp 16 | Ht 64.0 in | Wt 160.0 lb

## 2012-01-31 DIAGNOSIS — L538 Other specified erythematous conditions: Secondary | ICD-10-CM | POA: Diagnosis not present

## 2012-01-31 DIAGNOSIS — R21 Rash and other nonspecific skin eruption: Secondary | ICD-10-CM | POA: Diagnosis not present

## 2012-01-31 DIAGNOSIS — F17200 Nicotine dependence, unspecified, uncomplicated: Secondary | ICD-10-CM | POA: Insufficient documentation

## 2012-01-31 DIAGNOSIS — E039 Hypothyroidism, unspecified: Secondary | ICD-10-CM | POA: Insufficient documentation

## 2012-01-31 DIAGNOSIS — M1990 Unspecified osteoarthritis, unspecified site: Secondary | ICD-10-CM | POA: Insufficient documentation

## 2012-01-31 NOTE — Progress Notes (Signed)
[facility] [address] [place] [address] [phone]  Date:  01/31/2012   Name:  Katrina Velez   DOB:  1925-08-09   MRN:  [id number]  PCP:  No primary provider on file.    Chief Complaint: Rash   History of Present Illness:  Katrina Velez is a 78 y.o. very pleasant female patient who presents with the following:  She has noted a rash on the dorsal side of her foreams for about one month.  It has not itched or hurt.  She has not tried any medications as of yet.  Otherwise she feels fine.  She has not noted the rash anywhere else. The rash seems to be spreading so she thought she should be seen.  Otherwise she has been doing well and feeling ok recently.    There is no problem list on file for this patient.   No past medical history on file.  No past surgical history on file.  History  Substance Use Topics  . Smoking status: Former Games developer  . Smokeless tobacco: Not on file  . Alcohol Use: Not on file    No family history on file.  Allergies  Allergen Reactions  . Penicillins Hives    Medication list has been reviewed and updated.  Current Outpatient Prescriptions on File Prior to Visit  Medication Sig Dispense Refill  . levothyroxine (SYNTHROID, LEVOTHROID) 175 MCG tablet Take 175 mcg by mouth daily.        Review of Systems:  As per HPI- otherwise negative.   Physical Examination: Filed Vitals:   01/31/12 1017  BP: 146/70  Pulse: 65  Temp: 97.6 F (36.4 C)  Resp: 16   Filed Vitals:   01/31/12 1017  Height: 5\' 4"  (1.626 m)  Weight: 160 lb (72.576 kg)   Body mass index is 27.46 kg/(m^2). Ideal Body Weight: Weight in (lb) to have BMI = 25: 145.3   GEN: WDWN, NAD, Non-toxic, A & O x 3 HEENT: Atraumatic, Normocephalic. Neck supple. No masses, No LAD.  No oral lesions Ears and Nose: No external deformity. CV: RRR, No M/G/R. No JVD. No thrill. No extra heart sounds. PULM: CTA B, no wheezes, crackles, rhonchi. No retractions. No  resp. distress. No accessory muscle use. EXTR: No c/c/e NEURO Normal gait.  PSYCH: Normally interactive. Conversant. Not depressed or anxious appearing.  Calm demeanor.  On the backs of both forearms and elbow there is a nodular rash- it seems to be under the skin and not in the epidermis itself.  It is not tender or inflamed.  Discrete, round, nodular pattern.    Assessment and Plan: 1. Inflammatory arthritis    2. Skin eruption  Dermatology pathology   Skin eruption on dependent surfaces of both forearms. Took sample via punch biopsy today and will await this result.  Any changes or problems in the meantime- please call  Abbe Amsterdam, MD

## 2012-01-31 NOTE — Progress Notes (Signed)
   Patient ID: Katrina Velez MRN: [id number], DOB: [birth year], 78 y.o. Date of Encounter: 01/31/2012, 11:44 AM   PROCEDURE NOTE: Verbal consent obtained. Sterile technique employed. Numbing: Local anesthesia obtained with 1.5cc of 1% lidocaine with epinephrine.  Betadine prep per usual protocol.  3 mm punch biopsy taken from left elbow.  Biopsy placed in pathology transport medium, labeled, and form completed. Hemostasis obtained. Wound cleansed and dressed. Wound care instructions including precautions covered with patient.   Signed, Eula Listen, PA-C 01/31/2012 11:44 AM

## 2012-02-14 ENCOUNTER — Encounter: Payer: Self-pay | Admitting: Family Medicine

## 2012-03-25 ENCOUNTER — Ambulatory Visit (INDEPENDENT_AMBULATORY_CARE_PROVIDER_SITE_OTHER): Payer: Medicare Other | Admitting: Family Medicine

## 2012-03-25 VITALS — BP 132/72 | HR 68 | Temp 97.6°F | Resp 16 | Ht 64.0 in | Wt 159.0 lb

## 2012-03-25 DIAGNOSIS — E039 Hypothyroidism, unspecified: Secondary | ICD-10-CM

## 2012-03-25 DIAGNOSIS — Z23 Encounter for immunization: Secondary | ICD-10-CM | POA: Diagnosis not present

## 2012-03-25 DIAGNOSIS — R7309 Other abnormal glucose: Secondary | ICD-10-CM

## 2012-03-25 DIAGNOSIS — R5381 Other malaise: Secondary | ICD-10-CM | POA: Diagnosis not present

## 2012-03-25 DIAGNOSIS — R5383 Other fatigue: Secondary | ICD-10-CM

## 2012-03-25 DIAGNOSIS — M792 Neuralgia and neuritis, unspecified: Secondary | ICD-10-CM

## 2012-03-25 LAB — POCT CBC
Granulocyte percent: 59.5 %G (ref 37–80)
HCT, POC: 44.5 % (ref 37.7–47.9)
Hemoglobin: 13.6 g/dL (ref 12.2–16.2)
Lymph, poc: 2.5 (ref 0.6–3.4)
MCH, POC: 28 pg (ref 27–31.2)
MCHC: 30.6 g/dL — AB (ref 31.8–35.4)
MCV: 91.8 fL (ref 80–97)
MID (cbc): 0.6 (ref 0–0.9)
MPV: 9 fL (ref 0–99.8)
POC Granulocyte: 4.6 (ref 2–6.9)
POC LYMPH PERCENT: 32.4 %L (ref 10–50)
POC MID %: 8.1 %M (ref 0–12)
Platelet Count, POC: 436 10*3/uL — AB (ref 142–424)
RBC: 4.85 M/uL (ref 4.04–5.48)
RDW, POC: 14.6 %
WBC: 7.8 10*3/uL (ref 4.6–10.2)

## 2012-03-25 MED ORDER — LEVOTHYROXINE SODIUM 175 MCG PO TABS: 175.0000 ug | ORAL_TABLET | Freq: Every day | ORAL | Status: DC

## 2012-03-25 MED ORDER — AMITRIPTYLINE HCL 25 MG PO TABS: 25.0000 mg | ORAL_TABLET | Freq: Every day | ORAL | Status: DC

## 2012-03-25 NOTE — Patient Instructions (Signed)
Consider trying a melatonin at night for a supplement instead of benadryl.

## 2012-03-25 NOTE — Progress Notes (Signed)
Subjective:    Patient ID: Katrina Velez, female    DOB: 09-26-1925, 78 y.o.   MRN: [id number]  HPI  Needs synthroid refill. Has been on the same dose for many years. No trouble with compliance or side effects. No endocrine changes or complaints other than fatigue and occ trouble sleeping for which she takes prn benadryl.  Fatigue has been present for sev wks.  She wakes from sleep tired. No PND, orthopnea, or reported apnea. Lives alone so unsure if snoring but doubts. Occ trouble falling asleep, ok staying asleep. No nocturia. Occ naps during the day, is just tired all the time.  Has constant nerve pain on outer right mandible/gum area - on the inside of her chin. neuralgia was diagnosed sev yrs ago by a dentist when they did a lot of testing to find a cause of this pain and couldn't find anything - teeth and gum were normal so told it must be a chronic nerve problem - was intermittent but now getting worse and is constant since she had some dental work done sev wks ago. Daughter thinks that this is due to dental work causing flares.  Tried topical lidocaine and amnisol with partial relief.  No past medical history on file.  Review of Systems  Constitutional: Positive for fatigue. Negative for fever, chills, diaphoresis, activity change, appetite change and unexpected weight change.  HENT: Negative for trouble swallowing.   Respiratory: Negative for apnea.   Cardiovascular: Negative for palpitations.  Gastrointestinal: Negative for diarrhea and constipation.  Genitourinary: Negative for frequency and decreased urine volume.  Skin: Negative for color change, pallor and rash.  Neurological: Negative for tremors, syncope, weakness and numbness.  Psychiatric/Behavioral: Positive for sleep disturbance. Negative for decreased concentration. The patient is not nervous/anxious.       BP 132/72  Pulse 68  Temp 97.6 F (36.4 C)  Resp 16  Ht 5\' 4"  (1.626 m)  Wt 159 lb (72.122 kg)  BMI 27.29 kg/m2   SpO2 97% Objective:   Physical Exam  Constitutional: She is oriented to person, place, and time. She appears well-developed and well-nourished. No distress.  HENT:  Head: Normocephalic and atraumatic.  Right Ear: External ear normal.  Left Ear: External ear normal.  Eyes: Conjunctivae normal are normal. No scleral icterus.  Neck: Normal range of motion. Neck supple. No thyromegaly present.  Cardiovascular: Normal rate, regular rhythm, normal heart sounds and intact distal pulses.   Pulmonary/Chest: Effort normal and breath sounds normal. No respiratory distress.  Musculoskeletal: She exhibits no edema.  Lymphadenopathy:    She has no cervical adenopathy.  Neurological: She is alert and oriented to person, place, and time.  Skin: Skin is warm and dry. She is not diaphoretic. No erythema.  Psychiatric: She has a normal mood and affect. Her behavior is normal.          Results for orders placed in visit on 03/25/12  POCT CBC      Component Value Range   WBC 7.8  4.6 - 10.2 K/uL   Lymph, poc 2.5  0.6 - 3.4   POC LYMPH PERCENT 32.4  10 - 50 %L   MID (cbc) 0.6  0 - 0.9   POC MID % 8.1  0 - 12 %M   POC Granulocyte 4.6  2 - 6.9   Granulocyte percent 59.5  37 - 80 %G   RBC 4.85  4.04 - 5.48 M/uL   Hemoglobin 13.6  12.2 - 16.2 g/dL   HCT,  POC 44.5  37.7 - 47.9 %   MCV 91.8  80 - 97 fL   MCH, POC 28.0  27 - 31.2 pg   MCHC 30.6 (*) 31.8 - 35.4 g/dL   RDW, POC 14.7     Platelet Count, POC 436 (*) 142 - 424 K/uL   MPV 9.0  0 - 99.8 fL    Assessment & Plan:   1. Fatigue  POCT CBC, Comprehensive metabolic panel. Try stopping prn benadryl.   2. Hypothyroid  TSH, levothyroxine (SYNTHROID, LEVOTHROID) 175 MCG tablet. Refilled synthroid for a yr as tsh stable for a prolonged period.  3. Neuralgia in right mandibule amitriptyline (ELAVIL) 25 MG tablet. Trial of TCA to see if will help decrease nerve pain and help sleep. If doing ok, cons doubling in sev wks if needs additional pain  control  4. Immunization due  Flu vaccine greater than or equal to 3yo preservative free IM

## 2012-03-26 ENCOUNTER — Other Ambulatory Visit: Payer: Self-pay | Admitting: Family Medicine

## 2012-03-26 DIAGNOSIS — R7309 Other abnormal glucose: Secondary | ICD-10-CM | POA: Insufficient documentation

## 2012-03-26 DIAGNOSIS — E039 Hypothyroidism, unspecified: Secondary | ICD-10-CM

## 2012-03-26 LAB — COMPREHENSIVE METABOLIC PANEL
AST: 17 U/L (ref 0–37)
Albumin: 4 g/dL (ref 3.5–5.2)
Alkaline Phosphatase: 71 U/L (ref 39–117)
BUN: 19 mg/dL (ref 6–23)
Calcium: 9.9 mg/dL (ref 8.4–10.5)
Chloride: 103 mEq/L (ref 96–112)
Creat: 0.75 mg/dL (ref 0.50–1.10)
Glucose, Bld: 126 mg/dL — ABNORMAL HIGH (ref 70–99)

## 2012-03-26 MED ORDER — LEVOTHYROXINE SODIUM 150 MCG PO TABS: 150.0000 ug | ORAL_TABLET | Freq: Every day | ORAL | Status: DC

## 2012-03-27 ENCOUNTER — Telehealth: Payer: Self-pay | Admitting: Radiology

## 2012-03-27 NOTE — Telephone Encounter (Signed)
Add on lab

## 2012-03-27 NOTE — Addendum Note (Signed)
Addended byCaffie Damme on: 03/27/2012 10:27 AM   Modules accepted: Orders

## 2012-03-27 NOTE — Telephone Encounter (Signed)
Advised Dr Clelia Croft we cannot add on due to blood being thrown away. Please advise to get HGBA1C at next visit.

## 2012-03-27 NOTE — Telephone Encounter (Signed)
Message copied by Caffie Damme on Wed Mar 27, 2012 10:24 AM ------      Message from: [place], EVA      Created: Tue Mar 26, 2012  9:16 PM       Please add on hgba1c due to ICD9 abnormal glucose

## 2012-07-08 DIAGNOSIS — H01009 Unspecified blepharitis unspecified eye, unspecified eyelid: Secondary | ICD-10-CM | POA: Diagnosis not present

## 2012-07-08 DIAGNOSIS — H04129 Dry eye syndrome of unspecified lacrimal gland: Secondary | ICD-10-CM | POA: Diagnosis not present

## 2012-07-08 DIAGNOSIS — H10409 Unspecified chronic conjunctivitis, unspecified eye: Secondary | ICD-10-CM | POA: Diagnosis not present

## 2012-07-08 DIAGNOSIS — H01139 Eczematous dermatitis of unspecified eye, unspecified eyelid: Secondary | ICD-10-CM | POA: Diagnosis not present

## 2012-08-09 ENCOUNTER — Ambulatory Visit (INDEPENDENT_AMBULATORY_CARE_PROVIDER_SITE_OTHER): Payer: Medicare Other | Admitting: Family Medicine

## 2012-08-09 VITALS — BP 140/72 | HR 71 | Temp 97.9°F | Resp 16 | Ht 64.0 in | Wt 161.0 lb

## 2012-08-09 DIAGNOSIS — H01009 Unspecified blepharitis unspecified eye, unspecified eyelid: Secondary | ICD-10-CM | POA: Diagnosis not present

## 2012-08-09 DIAGNOSIS — H01003 Unspecified blepharitis right eye, unspecified eyelid: Secondary | ICD-10-CM

## 2012-08-09 DIAGNOSIS — L259 Unspecified contact dermatitis, unspecified cause: Secondary | ICD-10-CM

## 2012-08-09 DIAGNOSIS — L309 Dermatitis, unspecified: Secondary | ICD-10-CM

## 2012-08-09 MED ORDER — TOBRAMYCIN 0.3 % OP SOLN: 1.0000 [drp] | Freq: Four times a day (QID) | OPHTHALMIC | Status: DC

## 2012-08-09 MED ORDER — TRIAMCINOLONE ACETONIDE 0.1 % EX CREA: TOPICAL_CREAM | Freq: Two times a day (BID) | CUTANEOUS | Status: DC

## 2012-08-09 NOTE — Patient Instructions (Addendum)

## 2012-08-09 NOTE — Progress Notes (Signed)
79 yo woman with irritated right eye for a week.  She had similar problem 3 weeks ago treated by Dr. Dione Booze with drops successfully.  He also removed 4 eyelashes.  No change in vision  S/P cataract surgery in remote past.  Also c/o irritation in back of cervical spine, worse when clothes rub.  Cannot see rash.  Objective:  NAD Right lower lid swollen with exudate and eyelashes are turning in. 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

## 2012-10-15 DIAGNOSIS — H02039 Senile entropion of unspecified eye, unspecified eyelid: Secondary | ICD-10-CM | POA: Diagnosis not present

## 2012-10-15 DIAGNOSIS — Z961 Presence of intraocular lens: Secondary | ICD-10-CM | POA: Diagnosis not present

## 2012-10-25 DIAGNOSIS — H02039 Senile entropion of unspecified eye, unspecified eyelid: Secondary | ICD-10-CM | POA: Diagnosis not present

## 2012-12-25 ENCOUNTER — Other Ambulatory Visit: Payer: Self-pay

## 2013-01-29 DIAGNOSIS — H0019 Chalazion unspecified eye, unspecified eyelid: Secondary | ICD-10-CM | POA: Diagnosis not present

## 2013-02-22 ENCOUNTER — Inpatient Hospital Stay (HOSPITAL_COMMUNITY)
Admission: EM | Admit: 2013-02-22 | Discharge: 2013-02-25 | DRG: 536 | Disposition: A | Discharge status: Home / Self Care | Payer: Medicare Other | Attending: Internal Medicine | Admitting: Internal Medicine

## 2013-02-22 ENCOUNTER — Encounter (HOSPITAL_COMMUNITY): Payer: Self-pay | Admitting: *Deleted

## 2013-02-22 DIAGNOSIS — Z96649 Presence of unspecified artificial hip joint: Secondary | ICD-10-CM

## 2013-02-22 DIAGNOSIS — Y92009 Unspecified place in unspecified non-institutional (private) residence as the place of occurrence of the external cause: Secondary | ICD-10-CM

## 2013-02-22 DIAGNOSIS — M129 Arthropathy, unspecified: Secondary | ICD-10-CM | POA: Diagnosis present

## 2013-02-22 DIAGNOSIS — S79919A Unspecified injury of unspecified hip, initial encounter: Secondary | ICD-10-CM | POA: Diagnosis not present

## 2013-02-22 DIAGNOSIS — S72002A Fracture of unspecified part of neck of left femur, initial encounter for closed fracture: Secondary | ICD-10-CM

## 2013-02-22 DIAGNOSIS — R7309 Other abnormal glucose: Secondary | ICD-10-CM

## 2013-02-22 DIAGNOSIS — S72009A Fracture of unspecified part of neck of unspecified femur, initial encounter for closed fracture: Secondary | ICD-10-CM | POA: Diagnosis not present

## 2013-02-22 DIAGNOSIS — E039 Hypothyroidism, unspecified: Secondary | ICD-10-CM

## 2013-02-22 DIAGNOSIS — J449 Chronic obstructive pulmonary disease, unspecified: Secondary | ICD-10-CM

## 2013-02-22 DIAGNOSIS — S72109A Unspecified trochanteric fracture of unspecified femur, initial encounter for closed fracture: Principal | ICD-10-CM | POA: Diagnosis present

## 2013-02-22 DIAGNOSIS — F17200 Nicotine dependence, unspecified, uncomplicated: Secondary | ICD-10-CM | POA: Diagnosis not present

## 2013-02-22 DIAGNOSIS — Z87891 Personal history of nicotine dependence: Secondary | ICD-10-CM | POA: Diagnosis not present

## 2013-02-22 DIAGNOSIS — M25559 Pain in unspecified hip: Secondary | ICD-10-CM | POA: Diagnosis not present

## 2013-02-22 DIAGNOSIS — W010XXA Fall on same level from slipping, tripping and stumbling without subsequent striking against object, initial encounter: Secondary | ICD-10-CM | POA: Diagnosis present

## 2013-02-22 DIAGNOSIS — M1990 Unspecified osteoarthritis, unspecified site: Secondary | ICD-10-CM

## 2013-02-22 DIAGNOSIS — Z79899 Other long term (current) drug therapy: Secondary | ICD-10-CM | POA: Diagnosis not present

## 2013-02-22 MED ORDER — OXYCODONE-ACETAMINOPHEN 5-325 MG PO TABS
1.0000 | ORAL_TABLET | Freq: Once | ORAL | Status: AC
  Administered 2013-02-22: 1 via ORAL
  Filled 2013-02-22: qty 1

## 2013-02-22 NOTE — ED Notes (Addendum)
Pt states that she fell on Thursday landing on her left hip. Pt states that she was fine for yesterday and today until about 2 hours ago when she started having left hip and pain in her buttocks. Pt states that she is having difficulty walking as well due to pain. Pt uncomfortable to move in chair as well. CNS intact on left lower extremity

## 2013-02-22 NOTE — ED Notes (Signed)
MD at bedside. 

## 2013-02-23 ENCOUNTER — Emergency Department (HOSPITAL_COMMUNITY): Payer: Medicare Other

## 2013-02-23 ENCOUNTER — Encounter (HOSPITAL_COMMUNITY): Payer: Self-pay | Admitting: Emergency Medicine

## 2013-02-23 DIAGNOSIS — S72002A Fracture of unspecified part of neck of left femur, initial encounter for closed fracture: Secondary | ICD-10-CM

## 2013-02-23 DIAGNOSIS — S72009A Fracture of unspecified part of neck of unspecified femur, initial encounter for closed fracture: Secondary | ICD-10-CM

## 2013-02-23 LAB — CBC WITH DIFFERENTIAL/PLATELET
Basophils Absolute: 0 10*3/uL (ref 0.0–0.1)
Basophils Relative: 0 % (ref 0–1)
HCT: 40.2 % (ref 36.0–46.0)
Lymphocytes Relative: 17 % (ref 12–46)
MCH: 30 pg (ref 26.0–34.0)
Monocytes Absolute: 1.1 10*3/uL — ABNORMAL HIGH (ref 0.1–1.0)
Neutro Abs: 7.5 10*3/uL (ref 1.7–7.7)
Neutrophils Relative %: 70 % (ref 43–77)
Platelets: 341 10*3/uL (ref 150–400)
RDW: 13.8 % (ref 11.5–15.5)
WBC: 10.6 10*3/uL — ABNORMAL HIGH (ref 4.0–10.5)

## 2013-02-23 LAB — BASIC METABOLIC PANEL
CO2: 26 mEq/L (ref 19–32)
Chloride: 104 mEq/L (ref 96–112)
Creatinine, Ser: 0.72 mg/dL (ref 0.50–1.10)
GFR calc Af Amer: 88 mL/min — ABNORMAL LOW (ref >90)
Glucose, Bld: 109 mg/dL — ABNORMAL HIGH (ref 70–99)
Potassium: 4.4 mEq/L (ref 3.5–5.1)
Sodium: 141 mEq/L (ref 135–145)

## 2013-02-23 MED ORDER — ASPIRIN EC 325 MG PO TBEC
325.0000 mg | DELAYED_RELEASE_TABLET | Freq: Every day | ORAL | Status: DC
  Administered 2013-02-23 – 2013-02-25 (×3): 325 mg via ORAL
  Filled 2013-02-23 (×3): qty 1

## 2013-02-23 MED ORDER — MORPHINE SULFATE 2 MG/ML IJ SOLN
0.5000 mg | INTRAMUSCULAR | Status: DC | PRN
  Administered 2013-02-23 (×2): 0.5 mg via INTRAVENOUS
  Filled 2013-02-23 (×2): qty 1

## 2013-02-23 MED ORDER — INFLUENZA VAC SPLIT QUAD 0.5 ML IM SUSP
0.5000 mL | INTRAMUSCULAR | Status: AC
  Administered 2013-02-24: 0.5 mL via INTRAMUSCULAR
  Filled 2013-02-23: qty 0.5

## 2013-02-23 MED ORDER — CALCIUM CARBONATE ANTACID 500 MG PO CHEW
2.0000 | CHEWABLE_TABLET | Freq: Three times a day (TID) | ORAL | Status: DC | PRN
  Administered 2013-02-23: 400 mg via ORAL
  Filled 2013-02-23: qty 2

## 2013-02-23 MED ORDER — POLYETHYL GLYCOL-PROPYL GLYCOL 0.4-0.3 % OP SOLN: 2.0000 [drp] | Freq: Two times a day (BID) | OPHTHALMIC | Status: DC | PRN

## 2013-02-23 MED ORDER — MORPHINE SULFATE 2 MG/ML IJ SOLN
1.0000 mg | INTRAMUSCULAR | Status: DC | PRN
  Administered 2013-02-23 (×2): 1 mg via INTRAVENOUS
  Filled 2013-02-23 (×2): qty 1

## 2013-02-23 MED ORDER — MORPHINE SULFATE 4 MG/ML IJ SOLN
4.0000 mg | Freq: Once | INTRAMUSCULAR | Status: AC
  Administered 2013-02-23: 4 mg via INTRAMUSCULAR
  Filled 2013-02-23: qty 1

## 2013-02-23 MED ORDER — LEVOTHYROXINE SODIUM 175 MCG PO TABS
175.0000 ug | ORAL_TABLET | Freq: Every day | ORAL | Status: DC
Start: 2013-02-23 — End: 2013-02-25
  Administered 2013-02-23 – 2013-02-25 (×3): 175 ug via ORAL
  Filled 2013-02-23 (×4): qty 1

## 2013-02-23 MED ORDER — KETOTIFEN FUMARATE 0.025 % OP SOLN
2.0000 [drp] | Freq: Every day | OPHTHALMIC | Status: DC
  Filled 2013-02-23: qty 5

## 2013-02-23 MED ORDER — POLYVINYL ALCOHOL 1.4 % OP SOLN
2.0000 [drp] | Freq: Two times a day (BID) | OPHTHALMIC | Status: DC | PRN
  Filled 2013-02-23: qty 15

## 2013-02-23 MED ORDER — CYANOCOBALAMIN 500 MCG PO TABS
500.0000 ug | ORAL_TABLET | Freq: Every day | ORAL | Status: DC
  Administered 2013-02-25: 11:00:00 via ORAL
  Filled 2013-02-23 (×3): qty 1

## 2013-02-23 MED ORDER — HYDROCODONE-ACETAMINOPHEN 5-325 MG PO TABS
1.0000 | ORAL_TABLET | Freq: Four times a day (QID) | ORAL | Status: DC | PRN
  Administered 2013-02-23 – 2013-02-25 (×7): 2 via ORAL
  Filled 2013-02-23 (×7): qty 2

## 2013-02-23 NOTE — ED Notes (Signed)
Flow manager called and informed pt's admitting dx is wrong, admitting dx states right femur fracture, pt injured her left hip. RN taking pt on 5N informed also.

## 2013-02-23 NOTE — ED Provider Notes (Signed)
CSN: [id number]     Arrival date & time 02/22/13  2243 History   First MD Initiated Contact with Patient 02/22/13 2321     Chief Complaint  Patient presents with  . Fall   (Consider location/radiation/quality/duration/timing/severity/associated sxs/prior Treatment) HPI Patient had a mechanical trip and fall on Thursday landing on her left buttock. She is able to get up and has been ambulatory since then. She states that today she started having slowly worsening left buttock and left lateral hip pain pain got so bad this evening that she was unable to stand and walk on the left hip. She denies any weakness or numbness. She denies any head or neck trauma. Past Medical History  Diagnosis Date  . Arthritis   . Cataract   . Thyroid disease    Past Surgical History  Procedure Laterality Date  . Eye surgery    . Hernia repair    . Knee surgery    . Wrist surgery      both wrists   History reviewed. No pertinent family history. History  Substance Use Topics  . Smoking status: Former Games developer  . Smokeless tobacco: Not on file  . Alcohol Use: Yes   OB History   Grav Para Term Preterm Abortions TAB SAB Ect Mult Living                 Review of Systems  Constitutional: Negative for fever and chills.  HENT: Negative for neck pain.   Respiratory: Negative for cough and shortness of breath.   Cardiovascular: Negative for chest pain and palpitations.  Gastrointestinal: Negative for nausea, vomiting and abdominal pain.  Genitourinary: Negative for dysuria and flank pain.  Musculoskeletal: Positive for myalgias and arthralgias. Negative for back pain.  Skin: Negative for rash and wound.  Neurological: Negative for dizziness, weakness, light-headedness, numbness and headaches.  All other systems reviewed and are negative.    Allergies  Banana and Penicillins  Home Medications   Current Outpatient Rx  Name  Route  Sig  Dispense  Refill  . ascorbic acid (VITAMIN C) 500 MG tablet  Oral   Take 1,000 mg by mouth daily.         Marland Kitchen aspirin 325 MG EC tablet   Oral   Take 925 mg by mouth daily.         . cyanocobalamin 500 MCG tablet   Oral   Take 500 mcg by mouth daily.         . DiphenhydrAMINE HCl, Sleep, (ZZZQUIL) 25 MG CAPS   Oral   Take 25 mg by mouth at bedtime as needed (for sleep).         . hydrocortisone cream 1 %   Topical   Apply 1 application topically daily.         Marland Kitchen ketotifen (ALAWAY) 0.025 % ophthalmic solution   Both Eyes   Place 2 drops into both eyes daily.         Marland Kitchen levothyroxine (SYNTHROID, LEVOTHROID) 175 MCG tablet   Oral   Take 175 mcg by mouth daily before breakfast.         . Multiple Vitamin (MULTIVITAMIN) capsule   Oral   Take 1 capsule by mouth daily.         Marland Kitchen OVER THE COUNTER MEDICATION   Oral   Take 1 application by mouth daily as needed (for tooth pain). "ambesol"         . Polyethyl Glycol-Propyl Glycol (SYSTANE) 0.4-0.3 % SOLN  Ophthalmic   Apply 2 drops to eye 2 (two) times daily as needed (for dry eyes).         . triamcinolone cream (KENALOG) 0.1 %   Topical   Apply 1 application topically daily as needed (for excema).         . vitamin E 400 UNIT capsule   Oral   Take 400 Units by mouth daily.          BP 151/65  Pulse 105  Temp(Src) 97.8 F (36.6 C) (Oral)  Resp 20  SpO2 99% Physical Exam  Nursing note and vitals reviewed. Constitutional: She is oriented to person, place, and time. She appears well-developed and well-nourished. No distress.  HENT:  Head: Normocephalic and atraumatic.  Mouth/Throat: Oropharynx is clear and moist.  Eyes: EOM are normal. Pupils are equal, round, and reactive to light.  Neck: Normal range of motion. Neck supple.  Cardiovascular: Normal rate and regular rhythm.   Pulmonary/Chest: Effort normal and breath sounds normal. No respiratory distress. She has no wheezes. She has no rales. She exhibits no tenderness.  Abdominal: Soft. Bowel sounds are  normal. She exhibits no distension and no mass. There is no tenderness. There is no rebound and no guarding.  Musculoskeletal: Normal range of motion. She exhibits no edema and no tenderness.  Tenderness to palpation over her lateral hip and left buttock. No obvious hematoma. She has pain with range of motion of the L hip. No shortening or external rotation. She has 2+ dorsalis pedis pulses  Neurological: She is alert and oriented to person, place, and time.  Patient is alert and oriented x3 with clear, goal oriented speech. Patient has 5/5 motor in all extremities. Sensation is intact to light touch.   Skin: Skin is warm and dry. No rash noted. No erythema.  Psychiatric: She has a normal mood and affect. Her behavior is normal.    ED Course  Procedures (including critical care time) Labs Review Labs Reviewed - No data to display Imaging Review No results found.  MDM   Patient has not operative left proximal femur fracture. She continues to have pain beginning be unable to weight-bear. Discussed with Dr. Yisroel Ramming who suggested admitting to medicine service and he will consult on in the morning. [facility] to admit.  Loren Racer, MD 02/24/13 [phone]

## 2013-02-23 NOTE — Progress Notes (Signed)
Patient admitted early this AM by Dr. Julian Reil.  Await orthopedic eval.  NPO until see by ortho  Marlin Canary DO

## 2013-02-23 NOTE — Consult Note (Addendum)
Reason for Consult:   Left hip pain Referring Physician:    EDP and medical team  Katrina Velez is an 79 y.o. female  Who is normally a patient of Dr Luiz Blare. Had successful right THR a couple of years ago and has had some opposite left hip pain due to arthritis there as well.  Fell at home yesterday morning on buttocks and had some left hip pain but was able to get up and about.  Denies LOC and CP.  Later at dinner the pain got so bad that she could barely walk and last night was taken to ED by friend.  CT revealed nondisplaced hip fracture involving the greater trochanter.  Normally takes ASA about 325 TID for her arthritis.    Past Medical History  Diagnosis Date  . Arthritis   . Cataract   . Thyroid disease     Past Surgical History  Procedure Laterality Date  . Eye surgery    . Hernia repair    . Knee surgery    . Wrist surgery      both wrists  . Right hip replacement      History reviewed. No pertinent family history.  Social History:  reports that she has quit smoking. She does not have any smokeless tobacco history on file. She reports that  drinks alcohol. She reports that she does not use illicit drugs.  Allergies:  Allergies  Allergen Reactions  . Banana Nausea Only    All banana products also  . Penicillins Hives    Medications: I have reviewed the patient's current medications.  Results for orders placed during the hospital encounter of 02/22/13 (from the past 48 hour(s))  CBC WITH DIFFERENTIAL     Status: Abnormal   Collection Time    02/23/13  3:00 AM      Result Value Range   WBC 10.6 (*) 4.0 - 10.5 K/uL   RBC 4.54  3.87 - 5.11 MIL/uL   Hemoglobin 13.6  12.0 - 15.0 g/dL   HCT 16.1  09.6 - 04.5 %   MCV 88.5  78.0 - 100.0 fL   MCH 30.0  26.0 - 34.0 pg   MCHC 33.8  30.0 - 36.0 g/dL   RDW 40.9  81.1 - 91.4 %   Platelets 341  150 - 400 K/uL   Neutrophils Relative % 70  43 - 77 %   Neutro Abs 7.5  1.7 - 7.7 K/uL   Lymphocytes Relative 17  12 - 46 %   Lymphs Abs 1.8  0.7 - 4.0 K/uL   Monocytes Relative 10  3 - 12 %   Monocytes Absolute 1.1 (*) 0.1 - 1.0 K/uL   Eosinophils Relative 3  0 - 5 %   Eosinophils Absolute 0.3  0.0 - 0.7 K/uL   Basophils Relative 0  0 - 1 %   Basophils Absolute 0.0  0.0 - 0.1 K/uL  BASIC METABOLIC PANEL     Status: Abnormal   Collection Time    02/23/13  3:00 AM      Result Value Range   Sodium 141  135 - 145 mEq/L   Potassium 4.4  3.5 - 5.1 mEq/L   Chloride 104  96 - 112 mEq/L   CO2 26  19 - 32 mEq/L   Glucose, Bld 109 (*) 70 - 99 mg/dL   BUN 23  6 - 23 mg/dL   Creatinine, Ser 7.82  0.50 - 1.10 mg/dL   Calcium 9.2  8.4 -  10.5 mg/dL   GFR calc non Af Amer 76 (*) >90 mL/min   GFR calc Af Amer 88 (*) >90 mL/min   Comment: (NOTE)     The eGFR has been calculated using the CKD EPI equation.     This calculation has not been validated in all clinical situations.     eGFR's persistently <90 mL/min signify possible Chronic Kidney     Disease.    Dg Hip Complete Left  02/23/2013   CLINICAL DATA:  Fall, left hip pain  EXAM: LEFT HIP - COMPLETE 2+ VIEW  COMPARISON:  None.  FINDINGS: Right total hip arthroplasty noted. Calcified presumed fibroid noted over the pelvis. Lumbar spine degenerative change. Normal visualized bowel gas pattern. Mild left hip degenerative change is identified. No fracture or dislocation is identified.  IMPRESSION: No left hip fracture or dislocation.   Electronically Signed   By: Christiana Pellant M.D.   On: 02/23/2013 01:00   Ct Hip Left Wo Contrast  02/23/2013   *RADIOLOGY REPORT*  Clinical Data: Status post fall; landed on left hip.  Left hip pain.  CT OF THE LEFT HIP WITHOUT CONTRAST  Technique:  Multidetector CT imaging was performed according to the standard protocol. Multiplanar CT image reconstructions were also generated.  Comparison: Left hip radiographs performed earlier today at 12:38 a.m.  Findings: There appears to be a nearly nondisplaced fracture involving the tip of the left  greater femoral trochanter.  No additional fractures are seen.  There is significant superior narrowing of the left hip joint, with associated sclerotic change and subcortical cyst formation, particularly prominent at the femoral head.  Associated osteophytes are seen.  Degenerative change is noted along the lower lumbar spine, with associated facet disease and vacuum phenomenon.  Mild soft tissue injury is noted along the left buttock and flank. Calcified fibroids are seen within the uterus.  Scattered diverticulosis is noted along the sigmoid colon, without definite evidence of diverticulitis.  Diffuse calcification is seen along the abdominal aorta and its branches.  IMPRESSION:  1.  Nearly nondisplaced fracture involving the tip of the left femoral trochanter.  No additional fractures seen. 2.  Superior joint space narrowing at the left hip, with associated sclerotic change and subcortical cyst formation.  Degenerative change noted along the lower lumbar spine. 3.  Mild soft tissue injury along the left buttock and left flank. 4.  Calcified fibroids within the uterus. 5.  Scattered diverticulosis along the sigmoid colon, without evidence of diverticulitis. 6.  Diffuse calcification along the abdominal aorta and its branches.   Original Report Authenticated By: Tonia Ghent, M.D.    @ROS @ Blood pressure 129/67, pulse 81, temperature 97.8 F (36.6 C), temperature source Oral, resp. rate 16, height 5\' 4"  (1.626 m), weight 68.04 kg (150 lb), SpO2 98.00%.  PHYSICAL EXAM:   ABD soft Neurovascular intact Sensation intact distally Intact pulses distally Dorsiflexion/Plantar flexion intact Compartment soft  No pain on hip rotation Terrible pain to pressure over greater troch  ASSESSMENT:   Left hip greater troch fracture  PLAN:   This is a stable injury which rarely requires surgery.  Just needs pain control and gradual mobilization with PT.  Would expect she will be in hospital a couple of days.  If  struggles with mobilization might need placement temporarily but doubt that will be necessary.  Will have Dr Luiz Blare come by tomorrow as well.  Will order PT and diet. Don't think much needs to be done in terms of anticoagulation as  she normally takes about three ASA a day for her arthritis.  Would continue on that unless medical team feels strongly about a change.   Derrien Anschutz G 02/23/2013, 11:01 AM

## 2013-02-23 NOTE — H&P (Signed)
[facility] History and Physical  Phallon Haydu RUE:[id number] DOB: May 30, 1925 DOA: 02/22/2013  Referring physician: ED PCP: Lucilla Edin, MD   Chief Complaint: Fall, hip pain  HPI: Katrina Velez is a 79 y.o. female who had a trip and mechanical fall on Thursday, she landed on her L buttock.  She was able to get up, bear weight, and walk around on the L leg after the fall and continued to be able to do so today.  Unfortunately it was severely painful when doing this.  As a result she presented to the ED.  In the ED imaging studies revealed she had a nearly non-displaced greater trochanteric fracture of the L hip.  Orthopaedic surgery has been consulted.  Review of Systems: 12 systems reviewed and otherwise negative.  Past Medical History  Diagnosis Date  . Arthritis   . Cataract   . Thyroid disease    Past Surgical History  Procedure Laterality Date  . Eye surgery    . Hernia repair    . Knee surgery    . Wrist surgery      both wrists  . Right hip replacement     Social History:  reports that she has quit smoking. She does not have any smokeless tobacco history on file. She reports that  drinks alcohol. She reports that she does not use illicit drugs.   Allergies  Allergen Reactions  . Banana Nausea Only    All banana products also  . Penicillins Hives    History reviewed. No pertinent family history.  Prior to Admission medications   Medication Sig Start Date End Date Taking? Authorizing Provider  ascorbic acid (VITAMIN C) 500 MG tablet Take 1,000 mg by mouth daily.   Yes Historical Provider, MD  aspirin 325 MG EC tablet Take 925 mg by mouth daily.   Yes Historical Provider, MD  cyanocobalamin 500 MCG tablet Take 500 mcg by mouth daily.   Yes Historical Provider, MD  DiphenhydrAMINE HCl, Sleep, (ZZZQUIL) 25 MG CAPS Take 25 mg by mouth at bedtime as needed (for sleep).   Yes Historical Provider, MD  hydrocortisone cream 1 % Apply 1 application topically daily.    Yes Historical Provider, MD  ketotifen (ALAWAY) 0.025 % ophthalmic solution Place 2 drops into both eyes daily.   Yes Historical Provider, MD  levothyroxine (SYNTHROID, LEVOTHROID) 175 MCG tablet Take 175 mcg by mouth daily before breakfast.   Yes Historical Provider, MD  Multiple Vitamin (MULTIVITAMIN) capsule Take 1 capsule by mouth daily.   Yes Historical Provider, MD  OVER THE COUNTER MEDICATION Take 1 application by mouth daily as needed (for tooth pain). "ambesol"   Yes Historical Provider, MD  Polyethyl Glycol-Propyl Glycol (SYSTANE) 0.4-0.3 % SOLN Apply 2 drops to eye 2 (two) times daily as needed (for dry eyes).   Yes Historical Provider, MD  triamcinolone cream (KENALOG) 0.1 % Apply 1 application topically daily as needed (for excema).   Yes Historical Provider, MD  vitamin E 400 UNIT capsule Take 400 Units by mouth daily.   Yes Historical Provider, MD   Physical Exam: Filed Vitals:   02/23/13 0315  BP: 151/59  Pulse: 86  Temp:   Resp:     General:  NAD, resting comfortably in bed Eyes: PEERLA EOMI ENT: mucous membranes moist Neck: supple w/o JVD Cardiovascular: RRR w/o MRG Respiratory: CTA B Abdomen: soft, nt, nd, bs+ Skin: no rash nor lesion Musculoskeletal: MAE, full ROM all 4 extremities, pain with ROM of LLE Psychiatric:  normal tone and affect Neurologic: AAOx3, grossly non-focal  Labs on Admission:  Basic Metabolic Panel:  Recent Labs Lab 02/23/13 0300  NA 141  K 4.4  CL 104  CO2 26  GLUCOSE 109*  BUN 23  CREATININE 0.72  CALCIUM 9.2   Liver Function Tests: No results found for this basename: AST, ALT, ALKPHOS, BILITOT, PROT, ALBUMIN,  in the last 168 hours No results found for this basename: LIPASE, AMYLASE,  in the last 168 hours No results found for this basename: AMMONIA,  in the last 168 hours CBC:  Recent Labs Lab 02/23/13 0300  WBC 10.6*  NEUTROABS 7.5  HGB 13.6  HCT 40.2  MCV 88.5  PLT 341   Cardiac Enzymes: No results found for  this basename: CKTOTAL, CKMB, CKMBINDEX, TROPONINI,  in the last 168 hours  BNP (last 3 results) No results found for this basename: PROBNP,  in the last 8760 hours CBG: No results found for this basename: GLUCAP,  in the last 168 hours  Radiological Exams on Admission: Dg Hip Complete Left  02/23/2013   CLINICAL DATA:  Fall, left hip pain  EXAM: LEFT HIP - COMPLETE 2+ VIEW  COMPARISON:  None.  FINDINGS: Right total hip arthroplasty noted. Calcified presumed fibroid noted over the pelvis. Lumbar spine degenerative change. Normal visualized bowel gas pattern. Mild left hip degenerative change is identified. No fracture or dislocation is identified.  IMPRESSION: No left hip fracture or dislocation.   Electronically Signed   By: Christiana Pellant M.D.   On: 02/23/2013 01:00   Ct Hip Left Wo Contrast  02/23/2013   *RADIOLOGY REPORT*  Clinical Data: Status post fall; landed on left hip.  Left hip pain.  CT OF THE LEFT HIP WITHOUT CONTRAST  Technique:  Multidetector CT imaging was performed according to the standard protocol. Multiplanar CT image reconstructions were also generated.  Comparison: Left hip radiographs performed earlier today at 12:38 a.m.  Findings: There appears to be a nearly nondisplaced fracture involving the tip of the left greater femoral trochanter.  No additional fractures are seen.  There is significant superior narrowing of the left hip joint, with associated sclerotic change and subcortical cyst formation, particularly prominent at the femoral head.  Associated osteophytes are seen.  Degenerative change is noted along the lower lumbar spine, with associated facet disease and vacuum phenomenon.  Mild soft tissue injury is noted along the left buttock and flank. Calcified fibroids are seen within the uterus.  Scattered diverticulosis is noted along the sigmoid colon, without definite evidence of diverticulitis.  Diffuse calcification is seen along the abdominal aorta and its branches.   IMPRESSION:  1.  Nearly nondisplaced fracture involving the tip of the left femoral trochanter.  No additional fractures seen. 2.  Superior joint space narrowing at the left hip, with associated sclerotic change and subcortical cyst formation.  Degenerative change noted along the lower lumbar spine. 3.  Mild soft tissue injury along the left buttock and left flank. 4.  Calcified fibroids within the uterus. 5.  Scattered diverticulosis along the sigmoid colon, without evidence of diverticulitis. 6.  Diffuse calcification along the abdominal aorta and its branches.   Original Report Authenticated By: Tonia Ghent, M.D.    EKG: ordered and pending.  Assessment/Plan Principal Problem:   Closed left hip fracture   1. Closed left hip fracture of the greater trochanter - nearly non-displaced at this point, making patient bed rest strictly until ortho can eval in a couple of hours.  On hip fracture protocol, NPO in case they want to do surgery tomorrow.  EKG ordered and pending, previously patient has been in fairly good health and has undergone numerous other othropaedic procedures in the previous years (5 joint replacements, actually her L hip is the one large joint that hasnt been worked on yet).    Code Status: Full Code (must indicate code status--if unknown or must be presumed, indicate so) Family Communication: No family in room (indicate person spoken with, if applicable, with phone number if by telephone) Disposition Plan: Admit to inpatient (indicate anticipated LOS)  Time spent: 70 min  Kayhan Boardley M. [facility] Pager [phone]  If 7PM-7AM, please contact night-coverage [email or web address] Password [email or web address] 02/23/2013, 4:02 AM

## 2013-02-23 NOTE — ED Notes (Signed)
MD informed pt requesting pain medication.  

## 2013-02-23 NOTE — ED Notes (Signed)
Daughter, Lindwood Qua: [phone]; [phone]

## 2013-02-24 MED ORDER — POLYETHYLENE GLYCOL 3350 17 G PO PACK: 17.0000 g | PACK | Freq: Every day | ORAL | Status: DC | PRN

## 2013-02-24 MED ORDER — BUPIVACAINE HCL (PF) 0.5 % IJ SOLN
30.0000 mL | Freq: Once | INTRAMUSCULAR | Status: AC
  Administered 2013-02-24: 30 mL
  Filled 2013-02-24: qty 30

## 2013-02-24 MED ORDER — BUPIVACAINE HCL 0.5 % IJ SOLN
10.0000 mL | Freq: Once | INTRAMUSCULAR | Status: DC
  Filled 2013-02-24: qty 10

## 2013-02-24 MED ORDER — DOCUSATE SODIUM 100 MG PO CAPS
100.0000 mg | ORAL_CAPSULE | Freq: Two times a day (BID) | ORAL | Status: DC
  Administered 2013-02-24 – 2013-02-25 (×3): 100 mg via ORAL
  Filled 2013-02-24 (×4): qty 1

## 2013-02-24 MED ORDER — METHYLPREDNISOLONE ACETATE 40 MG/ML IJ SUSP
80.0000 mg | Freq: Once | INTRAMUSCULAR | Status: DC
  Filled 2013-02-24: qty 2

## 2013-02-24 NOTE — Progress Notes (Signed)
[facility].  Marland Kitchen She has a RW, 3:1 and tub transfer bench at home.   [facility] called with referral for HHPT.

## 2013-02-24 NOTE — Progress Notes (Signed)
[facility] PROGRESS NOTE  Katrina Velez WUJ:[id number] DOB: 02-Feb-1926 DOA: 02/22/2013 PCP: Lucilla Edin, MD  Assessment/Plan: Closed left hip fracture of the greater trochanter - nearly non-displaced at this point, ortho saw, will do hip injection this PM, PT eval- ? Home health PT- await notes, plan to d/c in AM on PO abx  Code Status: full Family Communication: patient/daughter Disposition Plan: d/c in AM   Consultants:  ortho  Procedures:    Antibiotics:    HPI/Subjective: Hip feeling better   Objective: Filed Vitals:   02/24/13 0758  BP:   Pulse:   Temp:   Resp: 16    Intake/Output Summary (Last 24 hours) at 02/24/13 1020 Last data filed at 02/24/13 0743  Gross per 24 hour  Intake    720 ml  Output    975 ml  Net   -255 ml   Filed Weights   02/23/13 0406  Weight: 68.04 kg (150 lb)    Exam:   General:  A+Ox3, NAD  Cardiovascular: rrr  Respiratory: clear anterior  Abdomen: +BS, soft  Musculoskeletal: moves all 4 ext   Data Reviewed: Basic Metabolic Panel:  Recent Labs Lab 02/23/13 0300  NA 141  K 4.4  CL 104  CO2 26  GLUCOSE 109*  BUN 23  CREATININE 0.72  CALCIUM 9.2   Liver Function Tests: No results found for this basename: AST, ALT, ALKPHOS, BILITOT, PROT, ALBUMIN,  in the last 168 hours No results found for this basename: LIPASE, AMYLASE,  in the last 168 hours No results found for this basename: AMMONIA,  in the last 168 hours CBC:  Recent Labs Lab 02/23/13 0300  WBC 10.6*  NEUTROABS 7.5  HGB 13.6  HCT 40.2  MCV 88.5  PLT 341   Cardiac Enzymes: No results found for this basename: CKTOTAL, CKMB, CKMBINDEX, TROPONINI,  in the last 168 hours BNP (last 3 results) No results found for this basename: PROBNP,  in the last 8760 hours CBG: No results found for this basename: GLUCAP,  in the last 168 hours  No results found for this or any previous visit (from the past 240 hour(s)).   Studies: Dg Hip Complete  Left  02/23/2013   CLINICAL DATA:  Fall, left hip pain  EXAM: LEFT HIP - COMPLETE 2+ VIEW  COMPARISON:  None.  FINDINGS: Right total hip arthroplasty noted. Calcified presumed fibroid noted over the pelvis. Lumbar spine degenerative change. Normal visualized bowel gas pattern. Mild left hip degenerative change is identified. No fracture or dislocation is identified.  IMPRESSION: No left hip fracture or dislocation.   Electronically Signed   By: Christiana Pellant M.D.   On: 02/23/2013 01:00   Ct Hip Left Wo Contrast  02/23/2013   *RADIOLOGY REPORT*  Clinical Data: Status post fall; landed on left hip.  Left hip pain.  CT OF THE LEFT HIP WITHOUT CONTRAST  Technique:  Multidetector CT imaging was performed according to the standard protocol. Multiplanar CT image reconstructions were also generated.  Comparison: Left hip radiographs performed earlier today at 12:38 a.m.  Findings: There appears to be a nearly nondisplaced fracture involving the tip of the left greater femoral trochanter.  No additional fractures are seen.  There is significant superior narrowing of the left hip joint, with associated sclerotic change and subcortical cyst formation, particularly prominent at the femoral head.  Associated osteophytes are seen.  Degenerative change is noted along the lower lumbar spine, with associated facet disease and vacuum phenomenon.  Mild  soft tissue injury is noted along the left buttock and flank. Calcified fibroids are seen within the uterus.  Scattered diverticulosis is noted along the sigmoid colon, without definite evidence of diverticulitis.  Diffuse calcification is seen along the abdominal aorta and its branches.  IMPRESSION:  1.  Nearly nondisplaced fracture involving the tip of the left femoral trochanter.  No additional fractures seen. 2.  Superior joint space narrowing at the left hip, with associated sclerotic change and subcortical cyst formation.  Degenerative change noted along the lower lumbar  spine. 3.  Mild soft tissue injury along the left buttock and left flank. 4.  Calcified fibroids within the uterus. 5.  Scattered diverticulosis along the sigmoid colon, without evidence of diverticulitis. 6.  Diffuse calcification along the abdominal aorta and its branches.   Original Report Authenticated By: Tonia Ghent, M.D.    Scheduled Meds: . aspirin EC  325 mg Oral Daily  . bupivacaine  30 mL Infiltration Once  . cyanocobalamin  500 mcg Oral Daily  . docusate sodium  100 mg Oral BID  . influenza vac split quadrivalent PF  0.5 mL Intramuscular Tomorrow-1000  . ketotifen  2 drop Both Eyes Daily  . levothyroxine  175 mcg Oral QAC breakfast  . methylPREDNISolone acetate  80 mg Intramuscular Once   Continuous Infusions:   Principal Problem:   Closed left hip fracture    Time spent: 35    [facility], Yocelyn Brocious  [facility] Pager [phone]. If 7PM-7AM, please contact night-coverage at [email or web address], password [facility] 02/24/2013, 10:20 AM  LOS: 2 days

## 2013-02-24 NOTE — Progress Notes (Signed)
Nutrition Brief Note  Received nutrition consult via the Hip Fracture Protocol  Pt reports no recent weight changes. Pt has good appetite and is eating most of her meals. Pt plans to d/c home tomorrow with home health.    Wt Readings from Last 15 Encounters:  02/23/13 150 lb (68.04 kg)  08/09/12 161 lb (73.029 kg)  03/25/12 159 lb (72.122 kg)  01/31/12 160 lb (72.576 kg)    Body mass index is 25.73 kg/(m^2). Patient meets criteria for Overweight based on current BMI.   Current diet order is Regular, patient is consuming approximately 75% of meals at this time. Labs and medications reviewed.   No nutrition interventions warranted at this time. If nutrition issues arise, please consult RD.   Kendell Bane RD, LDN, CNSC [phone] Pager [phone] After Hours Pager

## 2013-02-24 NOTE — Progress Notes (Signed)
Patient took Tums that family had brought in for her heartburn.  Nurse explained to patient that all home medications have to be reviewed and administered by the pharmacy, but patient was concerned that she would be "overcharged" or charged "$10 per pill" if she did this.  After more explanation, patient agreed to put the Tums away in her purse and to not take anything unless administered by the nurse.

## 2013-02-24 NOTE — Progress Notes (Signed)
Subjective: The patient complains of significant pain in the left side of the low back.  She is having difficulty with ambulation.  She is not hurting over the side dramatically.   Objective: Vital signs in last 24 hours: Temp:  [98 F (36.7 C)-98.8 F (37.1 C)] 98.6 F (37 C) (10/06 2006) Pulse Rate:  [71-78] 78 (10/06 2006) Resp:  [14-18] 18 (10/06 2006) BP: (100-138)/(48-93) 138/93 mmHg (10/06 2006) SpO2:  [97 %-99 %] 98 % (10/06 2006)  Intake/Output from previous day: 10/05 0701 - 10/06 0700 In: 120 [P.O.:120] Out: 750 [Urine:750] Intake/Output this shift:     Recent Labs  02/23/13 0300  HGB 13.6    Recent Labs  02/23/13 0300  WBC 10.6*  RBC 4.54  HCT 40.2  PLT 341    Recent Labs  02/23/13 0300  NA 141  K 4.4  CL 104  CO2 26  BUN 23  CREATININE 0.72  GLUCOSE 109*  CALCIUM 9.2   No results found for this basename: LABPT, INR,  in the last 72 hours  Neurologically intact ABD soft Neurovascular intact Sensation intact distally Intact pulses distally dramatically tender to palpation over the left side of the low back.  Mild tenderness palpation over the lateral aspect of the left hip.  Assessment/Plan: Patient with known nondisplaced greater trochanteric fracture on the left side.  Her pain seems to be greatest in the posterior aspect of the low back, left side.//After a long discussion of treatment options we ultimately elected to inject the left side of her low back with 6 cc Marcaine and 2 cc dexamethasone.The procedure was explained to the patient in detail, and all risks, benefits, and complications were reviewed with the patient.  All questions were answered.  We obtained verbal informed consent to proceed with the  injection.  The patient was positioned and draped appropriately.  Anatomical landmarks were identified.  The skin was prepped with Betadine in the usual fashion.  Using strict sterile technique 22-gauge, 1-1/2 inch needle was inserted and  steroid/ marcaine mixture injection was injected into the area without resistance.  The needle was removed, and a sterile bandage was applied.  Patient was observed in the office for 10 minutes without complications, and tolerated the procedure well.   I am hopeful this will allow the patient to ambulate more freely.  We will follow her in the hospital and ultimately as an outpatient.   Nathanel Tallman L 02/24/2013, 10:03 PM     2

## 2013-02-24 NOTE — Evaluation (Addendum)
Occupational Therapy Evaluation Patient Details Name: Katrina Velez MRN: [id number] DOB: 06/24/1925 Today's Date: 02/24/2013 Time: 4098-1191 OT Time Calculation (min): 26 min  OT Assessment / Plan / Recommendation History of present illness Admitted post fall resulting in greater trochanter fx, left side;   Clinical Impression   Pt admitted with above. Pt currently with functional limitations due to the deficits listed below (see OT Problem List). Pt independent with ADLs, PTA. Pt will benefit from skilled OT to increase their independence and safety to allow discharge to the venue listed below.      OT Assessment  Patient needs continued OT Services    Follow Up Recommendations  Home health OT;Supervision/Assistance - 24 hour    Barriers to Discharge      Equipment Recommendations  None recommended by OT    Recommendations for Other Services    Frequency  Min 2X/week    Precautions / Restrictions Precautions Precautions: Other (comment);Fall Precaution Comments: Pt instructed to avoid active hip abduction Restrictions Weight Bearing Restrictions: Yes LLE Weight Bearing: Weight bearing as tolerated (per nurse)   Pertinent Vitals/Pain Pain in left hip, but not rated. Repositioned.     ADL  Eating/Feeding: Independent Where Assessed - Eating/Feeding: Chair Grooming: Set up Where Assessed - Grooming: Supported sitting Upper Body Bathing: Set up Where Assessed - Upper Body Bathing: Supported sitting Lower Body Bathing: Minimal assistance Where Assessed - Lower Body Bathing: Supported sit to stand Upper Body Dressing: Set up Where Assessed - Upper Body Dressing: Supported sitting Lower Body Dressing: Moderate assistance Where Assessed - Lower Body Dressing: Supported sit to Pharmacist, hospital: Minimal Dentist Method: Sit to Barista: Other (comment) Nurse, children's chair) Toileting - Clothing Manipulation and Hygiene: Min  guard Where Assessed - Toileting Clothing Manipulation and Hygiene: Other (comment) (sit to stand from recliner chair) Tub/Shower Transfer Method: Not assessed Equipment Used: Rolling walker Transfers/Ambulation Related to ADLs: Assist to advance RLE when ambulating-only took a few steps. Min A for transfers. ADL Comments: Pt overall Min/Mod A for LB bathing/dressing without AE, however states she has reacher and sockaid at home and knows how to use them.  Pt limited by fatigue today. OT educated on tub transfer technique and demonstrated. Pt attempted to ambulate towards tub to practice, but was too fatigued. OT educated on safe shoes, use of bag on walker, to have someone pick up her rugs in her house for safety.  Gave pt theraband to increase UE strength.  Educated on dressing technique. Educated pt on chair push ups and pt unable to lift self up.    OT Diagnosis: Acute pain;Generalized weakness  OT Problem List: Decreased strength;Decreased range of motion;Decreased activity tolerance;Impaired balance (sitting and/or standing);Decreased knowledge of use of DME or AE;Decreased knowledge of precautions;Pain;Decreased safety awareness OT Treatment Interventions: Self-care/ADL training;Therapeutic exercise;DME and/or AE instruction;Therapeutic activities;Patient/family education;Balance training   OT Goals(Current goals can be found in the care plan section) Acute Rehab OT Goals Patient Stated Goal: to be able to walk better OT Goal Formulation: With patient Time For Goal Achievement: 03/03/13 Potential to Achieve Goals: Good ADL Goals Pt Will Perform Grooming: with modified independence;standing Pt Will Perform Lower Body Bathing: with set-up;with adaptive equipment;sit to/from stand Pt Will Perform Lower Body Dressing: with set-up;with adaptive equipment;sit to/from stand Pt Will Transfer to Toilet: with modified independence;ambulating (3 in 1 over commode) Pt Will Perform Toileting -  Clothing Manipulation and hygiene: with modified independence;sit to/from stand Pt Will Perform Tub/Shower Transfer: Tub transfer;with  supervision;ambulating;shower seat;rolling walker Additional ADL Goal #1: Pt will independently perform HEP of both UE's to increase strength.   Visit Information  Last OT Received On: 02/24/13 Assistance Needed: +1 History of Present Illness: Admitted post fall resulting in greater trochanter fx, left side;       Prior Functioning     Home Living Family/patient expects to be discharged to:: Private residence Living Arrangements: Alone Available Help at Discharge: Family;Available PRN/intermittently Type of Home: House Home Access: Stairs to enter Entrance Stairs-Number of Steps: 4 Entrance Stairs-Rails: Right;Left;Can reach both (no back steps; rail on R at front door) Home Layout: Two level;Able to live on main level with bedroom/bathroom Home Equipment: Dan Humphreys - 2 wheels;Bedside commode;Cane - single point;Shower seat;Grab bars - tub/shower;Other (comment);Adaptive equipment (son is checking to see if they have a tub transfer bench) Adaptive Equipment: Reacher;Sock aid Prior Function Level of Independence: Independent Communication Communication: No difficulties         Vision/Perception     Cognition  Cognition Arousal/Alertness: Awake/alert Behavior During Therapy: WFL for tasks assessed/performed Overall Cognitive Status: Within Functional Limits for tasks assessed    Extremity/Trunk Assessment Upper Extremity Assessment Upper Extremity Assessment: LUE deficits/detail;RUE deficits/detail RUE Deficits / Details: LUE weaker than RUE-previous shoulder surgeries on both; when testing strength pt stating that it was hurting her left leg?  Unable to perform chair push ups LUE Deficits / Details: LUE weaker than RUE-previous shoulder surgeries on both; when testing strength pt stating that it was hurting her left leg?  Unable to perform  chair push ups Lower Extremity Assessment Lower Extremity Assessment: Defer to PT evaluation     Mobility Bed Mobility Bed Mobility: Not assessed Transfers Transfers: Sit to Stand;Stand to Sit Sit to Stand: 4: Min assist;With upper extremity assist;From chair/3-in-1 Stand to Sit: 4: Min assist;With upper extremity assist;To chair/3-in-1 Details for Transfer Assistance: Min A to rise and sit. cues to control descent to chair. Cues for technique.     Exercise     Balance     End of Session OT - End of Session Equipment Utilized During Treatment: Gait belt;Rolling walker Activity Tolerance: Patient limited by fatigue Patient left: with call bell/phone within reach;in chair  GO     Earlie Raveling OTR/L [phone] 02/24/2013, 3:53 PM

## 2013-02-24 NOTE — Evaluation (Addendum)
Physical Therapy Evaluation Patient Details Name: Katrina Velez MRN: [id number] DOB: 09/08/1925 Today's Date: 02/24/2013 Time: 4132-4401 PT Time Calculation (min): 48 min  PT Assessment / Plan / Recommendation History of Present Illness  Admitted post fall resulting in greater trochanter fx, left side;  Clinical Impression  Pt admitted with above. Pt currently with functional limitations due to the deficits listed below (see PT Problem List).  Pt will benefit from skilled PT to increase their independence and safety with mobility to allow discharge to the venue listed below.       PT Assessment  Patient needs continued PT services    Follow Up Recommendations  Home health PT;Supervision - Intermittent    Does the patient have the potential to tolerate intense rehabilitation      Barriers to Discharge        Equipment Recommendations  Rolling walker with 5" wheels;Other (comment) (daughter checking; she may have one)    Recommendations for Other Services OT consult   Frequency Min 6X/week    Precautions / Restrictions Precautions Precautions: Other (comment) Precaution Comments: Pt instructed to avoid active hip abduction Restrictions Weight Bearing Restrictions: Yes LLE Weight Bearing: (not specifically stated in orders; will need clarification; discussed with RN)   Pertinent Vitals/Pain 6/10 L hip with amb; patient repositioned for comfort       Mobility  Bed Mobility Bed Mobility: Supine to Sit;Sitting - Scoot to Edge of Bed Supine to Sit: 4: Min guard;With rails Sitting - Scoot to Edge of Bed: 4: Min guard;With rail Details for Bed Mobility Assistance: Cues to keep feet and knees together to avoid L hip abduction; Overall smooth motion Transfers Transfers: Sit to Stand;Stand to Sit Sit to Stand: 4: Min guard;From bed;With upper extremity assist Stand to Sit: 4: Min guard;To chair/3-in-1;With upper extremity assist Details for Transfer Assistance: Cues for  safety, hand placement, technique, and to minimizeWBing LLE; very good rise from bed and good control of descent to chair Ambulation/Gait Ambulation/Gait Assistance: 4: Min guard Ambulation Distance (Feet): 20 Feet Assistive device: Rolling walker Ambulation/Gait Assistance Details: Cues for gait sequence, and to unweigh painful LLE by bearing weight into RW through arms; Very slow, with short steps, but quite steady Gait Pattern: Step-to pattern Stairs:  (Showed daughter technique and sequence for steps)    Exercises     PT Diagnosis: Difficulty walking;Acute pain  PT Problem List: Decreased strength;Decreased range of motion;Decreased activity tolerance;Decreased knowledge of use of DME;Pain PT Treatment Interventions: DME instruction;Gait training;Stair training;Functional mobility training;Therapeutic activities;Therapeutic exercise;Patient/family education     PT Goals(Current goals can be found in the care plan section) Acute Rehab PT Goals Patient Stated Goal: home PT Goal Formulation: With patient Time For Goal Achievement: 03/03/13 Potential to Achieve Goals: Good  Visit Information  Last PT Received On: 02/24/13 Assistance Needed: +1 History of Present Illness: Admitted post fall resulting in greater trochanter fx, left side;       Prior Functioning  Home Living Family/patient expects to be discharged to:: Private residence Living Arrangements: Alone Available Help at Discharge: Family;Available PRN/intermittently Type of Home: House Home Access: Stairs to enter Entrance Stairs-Number of Steps: 4 Entrance Stairs-Rails: Right;Left;Can reach both (on back steps; rail on R at front door) Home Layout: Two level;Able to live on main level with bedroom/bathroom Home Equipment: Dan Humphreys - 2 wheels;Bedside commode;Cane - single point;Shower seat;Grab bars - tub/shower;Other (comment) (son is checking to se if they have a tub transfer bench) Prior Function Level of  Independence: Independent Communication  Communication: No difficulties (slightly HOH)    Cognition  Cognition Arousal/Alertness: Awake/alert Behavior During Therapy: WFL for tasks assessed/performed Overall Cognitive Status: Within Functional Limits for tasks assessed    Extremity/Trunk Assessment Upper Extremity Assessment Upper Extremity Assessment: Overall WFL for tasks assessed Lower Extremity Assessment Lower Extremity Assessment: LLE deficits/detail LLE Deficits / Details: decr AROM, limited by pain   Balance    End of Session PT - End of Session Activity Tolerance: Patient tolerated treatment well Patient left: in chair;with call bell/phone within reach;with family/visitor present Nurse Communication: Mobility status;Other (comment) (need to clarify WBing status)  GP     Van Clines [facility] [place], [place] [phone]  02/24/2013, 10:39 AM

## 2013-02-25 DIAGNOSIS — F17200 Nicotine dependence, unspecified, uncomplicated: Secondary | ICD-10-CM

## 2013-02-25 DIAGNOSIS — E039 Hypothyroidism, unspecified: Secondary | ICD-10-CM

## 2013-02-25 MED ORDER — DSS 100 MG PO CAPS: 100.0000 mg | ORAL_CAPSULE | Freq: Two times a day (BID) | ORAL | Status: DC

## 2013-02-25 MED ORDER — HYDROCODONE-ACETAMINOPHEN 5-325 MG PO TABS: 1.0000 | ORAL_TABLET | Freq: Four times a day (QID) | ORAL | Status: DC | PRN

## 2013-02-25 NOTE — Progress Notes (Signed)
Physical Therapy Treatment Patient Details Name: Katrina Velez MRN: [id number] DOB: Aug 07, 1925 Today's Date: 02/25/2013 Time: 6213-0865 PT Time Calculation (min): 23 min  PT Assessment / Plan / Recommendation  History of Present Illness Admitted post fall resulting in greater trochanter fx, left side;   PT Comments   Pt expresses concern about returning home today.  States she does not feel ready to go.  Overall pt steady on feet but is having significant difficulty WBing through LLE to allow sufficient step with RLE.  Pt ambulated ~50' today & she states she feels like that is adequate distance to ambulate around her house from room to room.    Follow Up Recommendations  Home health PT;Supervision - Intermittent     Does the patient have the potential to tolerate intense rehabilitation     Barriers to Discharge        Equipment Recommendations  Rolling walker with 5" wheels;Other (comment)    Recommendations for Other Services OT consult  Frequency Min 6X/week   Progress towards PT Goals Progress towards PT goals: Progressing toward goals  Plan Current plan remains appropriate    Precautions / Restrictions Precautions Precautions: Fall Restrictions LLE Weight Bearing: Weight bearing as tolerated (per RN)   Pertinent Vitals/Pain C/o Lt hip pain.  Did not rate but she did state it feels better today than it did yesterday.      Mobility  Bed Mobility Bed Mobility: Not assessed Transfers Transfers: Sit to Stand;Stand to Sit Sit to Stand: 4: Min guard;With upper extremity assist;From bed Stand to Sit: 4: Min guard;With upper extremity assist;With armrests;To chair/3-in-1 Details for Transfer Assistance: Pt able to demonstrate safe hand placement Ambulation/Gait Ambulation/Gait Assistance: 4: Min guard Ambulation Distance (Feet): 50 Feet Assistive device: Rolling walker Ambulation/Gait Assistance Details: Pt with difficulty tolerating weight shifting to Lt sufficiently enough  to allow floor clearance with Rt foot.   Gait Pattern: Step-to pattern;Decreased weight shift to left;Decreased hip/knee flexion - right;Antalgic Gait velocity: decreased General Gait Details: difficulty clearing floor with Rt foot but overall steady Stairs: Yes Stairs Assistance: 4: Min assist Stairs Assistance Details (indicate cue type and reason): (A) to step up to next step due to pt with difficulty tolerating weight through LLE.  Stair Management Technique: One rail Right;Step to pattern;Forwards Number of Stairs: 3 Wheelchair Mobility Wheelchair Mobility: No        PT Goals (current goals can now be found in the care plan section) Acute Rehab PT Goals PT Goal Formulation: With patient Time For Goal Achievement: 03/03/13 Potential to Achieve Goals: Good  Visit Information  Last PT Received On: 02/25/13 Assistance Needed: +1 History of Present Illness: Admitted post fall resulting in greater trochanter fx, left side;    Subjective Data      Cognition  Cognition Arousal/Alertness: Awake/alert Behavior During Therapy: WFL for tasks assessed/performed;Agitated Overall Cognitive Status: Within Functional Limits for tasks assessed    Balance     End of Session PT - End of Session Equipment Utilized During Treatment: Gait belt Activity Tolerance: Patient tolerated treatment well;Patient limited by pain Patient left: in chair;with call bell/phone within reach Nurse Communication: Mobility status   GP     Lara Mulch 02/25/2013, 1:06 PM   Verdell Face, PTA [phone] 02/25/2013

## 2013-02-25 NOTE — Progress Notes (Signed)
Occupational Therapy Treatment Patient Details Name: Katrina Velez MRN: [id number] DOB: [birth year] Today's Date: 02/25/2013 Time: 9528-4132 OT Time Calculation (min): 20 min  OT Assessment / Plan / Recommendation       OT comments  Pt. Awake upon arrival.  Agreeable to oob for better positioning for breakfast.  Bed mobility s, sim. toileting with min guard.  Pt. Doing better with sit/stand than previously doc. States she is still nervous about ambulation and being able to make it the distances required to get around in her house.    Follow Up Recommendations  Home health OT;Supervision/Assistance - 24 hour                      Frequency Min 2X/week   Progress towards OT Goals Progress towards OT goals: Progressing toward goals  Plan Discharge plan remains appropriate    Precautions / Restrictions Precautions Precautions: Fall Restrictions LLE Weight Bearing: Weight bearing as tolerated   Pertinent Vitals/Pain 7/10, repositioned and encouraged her to discuss with r.n. For pain meds    ADL  Lower Body Bathing: Other (comment) (declined practice with a/e has from previous sx ) Lower Body Dressing: Other (comment) (declined review of a/e states has from previous sx) Toilet Transfer: Min Musician Method: Stand pivot Transfers/Ambulation Related to ADLs: cues for hand placement on bed before trying to transition from sit/stand, did better with sit/stand today then in previous doc. ADL Comments: declined review of a/e stating she has kit from previous sx. and knows how to use.  min guard for sim. bsc transfer, reports that dtr. lives across the st. but works so could provide int. assistance but not 24/7       OT Goals(current goals can now be found in the care plan section)    Visit Information  Last OT Received On: 02/25/13    Subjective Data   "im so tired of everyone coming in and asking me the same questions over and over gee wiz", explained pt. To  be pt. With the process as she is agitated but then also verbalizes that she is nervous about going home too soon.          Cognition  Cognition Arousal/Alertness: Awake/alert Behavior During Therapy: WFL for tasks assessed/performed;Agitated Overall Cognitive Status: Within Functional Limits for tasks assessed    Mobility  Bed Mobility Details for Bed Mobility Assistance: hob flat, no rails, pt. able to transition from supine to sit and bring hips to eob with s Transfers Transfers: Sit to Stand;Stand to Sit Sit to Stand: 4: Min guard;From bed Stand to Sit: 4: Min guard;With armrests;To chair/3-in-1 Details for Transfer Assistance: cues for hand placment during sit/stand and sliding LLE forward prior to sitting down with controlled descent to chair.                 End of Session OT - End of Session Equipment Utilized During Treatment: Rolling walker Activity Tolerance: Patient tolerated treatment well Patient left: in chair;with call bell/phone within reach       Robet Leu, COTA/L 02/25/2013, 7:47 AM

## 2013-02-25 NOTE — Discharge Summary (Signed)
Physician Discharge Summary  Katrina Velez JXB:[id number] DOB: 1925/06/30 DOA: 02/22/2013  PCP: Lucilla Edin, MD  Admit date: 02/22/2013 Discharge date: 02/25/2013  Time spent: 35 minutes  Recommendations for Outpatient Follow-up:  1. Follow up with ortho as needed  Discharge Diagnoses:  Principal Problem:   Closed left hip fracture   Discharge Condition: improved  Diet recommendation: cardiac  Filed Weights   02/23/13 0406  Weight: 68.04 kg (150 lb)    History of present illness:  Katrina Velez is a 79 y.o. female who had a trip and mechanical fall on Thursday, she landed on her L buttock. She was able to get up, bear weight, and walk around on the L leg after the fall and continued to be able to do so today. Unfortunately it was severely painful when doing this. As a result she presented to the ED.  In the ED imaging studies revealed she had a nearly non-displaced greater trochanteric fracture of the L hip. Orthopaedic surgery has been consulted.   Hospital Course:  Closed left hip fracture of the greater trochanter - nearly non-displaced at this point, ortho saw and did hip injection, PT eval: Home health PT- taking only PO pain meds  Procedures:  Hip injection  Consultations:  ortho  Discharge Exam: Filed Vitals:   02/25/13 0449  BP: 138/50  Pulse:   Temp: 98.2 F (36.8 C)  Resp: 18    General: A+OX3, NAD Cardiovascular: rrr Respiratory: clear anterior  Discharge Instructions      Discharge Orders   Future Orders Complete By Expires   Diet general  As directed    Discharge instructions  As directed    Comments:     Home health PT -intermittant supervision   Increase activity slowly  As directed        Medication List         ALAWAY 0.025 % ophthalmic solution  Generic drug:  ketotifen  Place 2 drops into both eyes daily.     ascorbic acid 500 MG tablet  Commonly known as:  VITAMIN C  Take 1,000 mg by mouth daily.     aspirin 325 MG EC  tablet  Take 925 mg by mouth daily.     cyanocobalamin 500 MCG tablet  Take 500 mcg by mouth daily.     DSS 100 MG Caps  Take 100 mg by mouth 2 (two) times daily.     HYDROcodone-acetaminophen 5-325 MG per tablet  Commonly known as:  NORCO/VICODIN  Take 1-2 tablets by mouth every 6 (six) hours as needed.     hydrocortisone cream 1 %  Apply 1 application topically daily.     levothyroxine 175 MCG tablet  Commonly known as:  SYNTHROID, LEVOTHROID  Take 175 mcg by mouth daily before breakfast.     multivitamin capsule  Take 1 capsule by mouth daily.     OVER THE COUNTER MEDICATION  Take 1 application by mouth daily as needed (for tooth pain). "ambesol"     SYSTANE 0.4-0.3 % Soln  Generic drug:  Polyethyl Glycol-Propyl Glycol  Apply 2 drops to eye 2 (two) times daily as needed (for dry eyes).     triamcinolone cream 0.1 %  Commonly known as:  KENALOG  Apply 1 application topically daily as needed (for excema).     vitamin E 400 UNIT capsule  Take 400 Units by mouth daily.     ZZZQUIL 25 MG Caps  Generic drug:  DiphenhydrAMINE HCl (Sleep)  Take 25  mg by mouth at bedtime as needed (for sleep).       Allergies  Allergen Reactions  . Banana Nausea Only    All banana products also  . Penicillins Hives   Follow-up Information   Follow up with GRAVES,JOHN L, MD. Schedule an appointment as soon as possible for a visit in 2 weeks.   Specialty:  Orthopedic Surgery   Contact information:   [address] [place] [address] [phone]       Follow up with DAUB, STEVE A, MD In 1 week.   Specialty:  Family Medicine   Contact information:   [address] [place] [address] [phone]        The results of significant diagnostics from this hospitalization (including imaging, microbiology, ancillary and laboratory) are listed below for reference.    Significant Diagnostic Studies: Dg Hip Complete Left  02/23/2013   CLINICAL DATA:  Fall, left hip pain   EXAM: LEFT HIP - COMPLETE 2+ VIEW  COMPARISON:  None.  FINDINGS: Right total hip arthroplasty noted. Calcified presumed fibroid noted over the pelvis. Lumbar spine degenerative change. Normal visualized bowel gas pattern. Mild left hip degenerative change is identified. No fracture or dislocation is identified.  IMPRESSION: No left hip fracture or dislocation.   Electronically Signed   By: Christiana Pellant M.D.   On: 02/23/2013 01:00   Ct Hip Left Wo Contrast  02/23/2013   *RADIOLOGY REPORT*  Clinical Data: Status post fall; landed on left hip.  Left hip pain.  CT OF THE LEFT HIP WITHOUT CONTRAST  Technique:  Multidetector CT imaging was performed according to the standard protocol. Multiplanar CT image reconstructions were also generated.  Comparison: Left hip radiographs performed earlier today at 12:38 a.m.  Findings: There appears to be a nearly nondisplaced fracture involving the tip of the left greater femoral trochanter.  No additional fractures are seen.  There is significant superior narrowing of the left hip joint, with associated sclerotic change and subcortical cyst formation, particularly prominent at the femoral head.  Associated osteophytes are seen.  Degenerative change is noted along the lower lumbar spine, with associated facet disease and vacuum phenomenon.  Mild soft tissue injury is noted along the left buttock and flank. Calcified fibroids are seen within the uterus.  Scattered diverticulosis is noted along the sigmoid colon, without definite evidence of diverticulitis.  Diffuse calcification is seen along the abdominal aorta and its branches.  IMPRESSION:  1.  Nearly nondisplaced fracture involving the tip of the left femoral trochanter.  No additional fractures seen. 2.  Superior joint space narrowing at the left hip, with associated sclerotic change and subcortical cyst formation.  Degenerative change noted along the lower lumbar spine. 3.  Mild soft tissue injury along the left buttock  and left flank. 4.  Calcified fibroids within the uterus. 5.  Scattered diverticulosis along the sigmoid colon, without evidence of diverticulitis. 6.  Diffuse calcification along the abdominal aorta and its branches.   Original Report Authenticated By: Tonia Ghent, M.D.    Microbiology: No results found for this or any previous visit (from the past 240 hour(s)).   Labs: Basic Metabolic Panel:  Recent Labs Lab 02/23/13 0300  NA 141  K 4.4  CL 104  CO2 26  GLUCOSE 109*  BUN 23  CREATININE 0.72  CALCIUM 9.2   Liver Function Tests: No results found for this basename: AST, ALT, ALKPHOS, BILITOT, PROT, ALBUMIN,  in the last 168 hours No results found for  this basename: LIPASE, AMYLASE,  in the last 168 hours No results found for this basename: AMMONIA,  in the last 168 hours CBC:  Recent Labs Lab 02/23/13 0300  WBC 10.6*  NEUTROABS 7.5  HGB 13.6  HCT 40.2  MCV 88.5  PLT 341   Cardiac Enzymes: No results found for this basename: CKTOTAL, CKMB, CKMBINDEX, TROPONINI,  in the last 168 hours BNP: BNP (last 3 results) No results found for this basename: PROBNP,  in the last 8760 hours CBG: No results found for this basename: GLUCAP,  in the last 168 hours     Signed:  Marlin Canary  [facility] 02/25/2013, 2:14 PM

## 2013-02-26 DIAGNOSIS — Z602 Problems related to living alone: Secondary | ICD-10-CM | POA: Diagnosis not present

## 2013-02-26 DIAGNOSIS — S72009D Fracture of unspecified part of neck of unspecified femur, subsequent encounter for closed fracture with routine healing: Secondary | ICD-10-CM | POA: Diagnosis not present

## 2013-02-26 DIAGNOSIS — M25559 Pain in unspecified hip: Secondary | ICD-10-CM | POA: Diagnosis not present

## 2013-02-26 DIAGNOSIS — M1990 Unspecified osteoarthritis, unspecified site: Secondary | ICD-10-CM | POA: Diagnosis not present

## 2013-02-26 DIAGNOSIS — IMO0001 Reserved for inherently not codable concepts without codable children: Secondary | ICD-10-CM | POA: Diagnosis not present

## 2013-02-26 DIAGNOSIS — Z9181 History of falling: Secondary | ICD-10-CM | POA: Diagnosis not present

## 2013-03-04 DIAGNOSIS — S72009D Fracture of unspecified part of neck of unspecified femur, subsequent encounter for closed fracture with routine healing: Secondary | ICD-10-CM | POA: Diagnosis not present

## 2013-03-04 DIAGNOSIS — M1990 Unspecified osteoarthritis, unspecified site: Secondary | ICD-10-CM | POA: Diagnosis not present

## 2013-03-04 DIAGNOSIS — IMO0001 Reserved for inherently not codable concepts without codable children: Secondary | ICD-10-CM | POA: Diagnosis not present

## 2013-03-04 DIAGNOSIS — Z9181 History of falling: Secondary | ICD-10-CM | POA: Diagnosis not present

## 2013-03-04 DIAGNOSIS — Z602 Problems related to living alone: Secondary | ICD-10-CM | POA: Diagnosis not present

## 2013-03-04 DIAGNOSIS — M25559 Pain in unspecified hip: Secondary | ICD-10-CM | POA: Diagnosis not present

## 2013-03-06 DIAGNOSIS — Z9181 History of falling: Secondary | ICD-10-CM | POA: Diagnosis not present

## 2013-03-06 DIAGNOSIS — IMO0001 Reserved for inherently not codable concepts without codable children: Secondary | ICD-10-CM | POA: Diagnosis not present

## 2013-03-06 DIAGNOSIS — M25559 Pain in unspecified hip: Secondary | ICD-10-CM | POA: Diagnosis not present

## 2013-03-06 DIAGNOSIS — Z602 Problems related to living alone: Secondary | ICD-10-CM | POA: Diagnosis not present

## 2013-03-06 DIAGNOSIS — S72009D Fracture of unspecified part of neck of unspecified femur, subsequent encounter for closed fracture with routine healing: Secondary | ICD-10-CM | POA: Diagnosis not present

## 2013-03-06 DIAGNOSIS — M1990 Unspecified osteoarthritis, unspecified site: Secondary | ICD-10-CM | POA: Diagnosis not present

## 2013-03-10 DIAGNOSIS — Z602 Problems related to living alone: Secondary | ICD-10-CM | POA: Diagnosis not present

## 2013-03-10 DIAGNOSIS — IMO0001 Reserved for inherently not codable concepts without codable children: Secondary | ICD-10-CM | POA: Diagnosis not present

## 2013-03-10 DIAGNOSIS — Z9181 History of falling: Secondary | ICD-10-CM | POA: Diagnosis not present

## 2013-03-10 DIAGNOSIS — M25559 Pain in unspecified hip: Secondary | ICD-10-CM | POA: Diagnosis not present

## 2013-03-10 DIAGNOSIS — M1990 Unspecified osteoarthritis, unspecified site: Secondary | ICD-10-CM | POA: Diagnosis not present

## 2013-03-10 DIAGNOSIS — S72009D Fracture of unspecified part of neck of unspecified femur, subsequent encounter for closed fracture with routine healing: Secondary | ICD-10-CM | POA: Diagnosis not present

## 2013-03-11 DIAGNOSIS — M25559 Pain in unspecified hip: Secondary | ICD-10-CM | POA: Diagnosis not present

## 2013-03-12 DIAGNOSIS — Z9181 History of falling: Secondary | ICD-10-CM | POA: Diagnosis not present

## 2013-03-12 DIAGNOSIS — M25559 Pain in unspecified hip: Secondary | ICD-10-CM | POA: Diagnosis not present

## 2013-03-12 DIAGNOSIS — Z602 Problems related to living alone: Secondary | ICD-10-CM | POA: Diagnosis not present

## 2013-03-12 DIAGNOSIS — IMO0001 Reserved for inherently not codable concepts without codable children: Secondary | ICD-10-CM | POA: Diagnosis not present

## 2013-03-12 DIAGNOSIS — S72009D Fracture of unspecified part of neck of unspecified femur, subsequent encounter for closed fracture with routine healing: Secondary | ICD-10-CM | POA: Diagnosis not present

## 2013-03-12 DIAGNOSIS — M1990 Unspecified osteoarthritis, unspecified site: Secondary | ICD-10-CM | POA: Diagnosis not present

## 2013-03-18 DIAGNOSIS — IMO0001 Reserved for inherently not codable concepts without codable children: Secondary | ICD-10-CM | POA: Diagnosis not present

## 2013-03-18 DIAGNOSIS — Z9181 History of falling: Secondary | ICD-10-CM | POA: Diagnosis not present

## 2013-03-18 DIAGNOSIS — S72009D Fracture of unspecified part of neck of unspecified femur, subsequent encounter for closed fracture with routine healing: Secondary | ICD-10-CM | POA: Diagnosis not present

## 2013-03-18 DIAGNOSIS — Z602 Problems related to living alone: Secondary | ICD-10-CM | POA: Diagnosis not present

## 2013-03-18 DIAGNOSIS — M1990 Unspecified osteoarthritis, unspecified site: Secondary | ICD-10-CM | POA: Diagnosis not present

## 2013-03-18 DIAGNOSIS — M25559 Pain in unspecified hip: Secondary | ICD-10-CM | POA: Diagnosis not present

## 2013-03-20 DIAGNOSIS — M1990 Unspecified osteoarthritis, unspecified site: Secondary | ICD-10-CM | POA: Diagnosis not present

## 2013-03-20 DIAGNOSIS — M25559 Pain in unspecified hip: Secondary | ICD-10-CM | POA: Diagnosis not present

## 2013-03-20 DIAGNOSIS — S72009D Fracture of unspecified part of neck of unspecified femur, subsequent encounter for closed fracture with routine healing: Secondary | ICD-10-CM | POA: Diagnosis not present

## 2013-03-20 DIAGNOSIS — IMO0001 Reserved for inherently not codable concepts without codable children: Secondary | ICD-10-CM | POA: Diagnosis not present

## 2013-03-20 DIAGNOSIS — Z9181 History of falling: Secondary | ICD-10-CM | POA: Diagnosis not present

## 2013-03-20 DIAGNOSIS — Z602 Problems related to living alone: Secondary | ICD-10-CM | POA: Diagnosis not present

## 2013-03-25 DIAGNOSIS — M25559 Pain in unspecified hip: Secondary | ICD-10-CM | POA: Diagnosis not present

## 2013-03-25 DIAGNOSIS — S72009D Fracture of unspecified part of neck of unspecified femur, subsequent encounter for closed fracture with routine healing: Secondary | ICD-10-CM | POA: Diagnosis not present

## 2013-03-25 DIAGNOSIS — Z602 Problems related to living alone: Secondary | ICD-10-CM | POA: Diagnosis not present

## 2013-03-25 DIAGNOSIS — M1990 Unspecified osteoarthritis, unspecified site: Secondary | ICD-10-CM | POA: Diagnosis not present

## 2013-03-25 DIAGNOSIS — Z9181 History of falling: Secondary | ICD-10-CM | POA: Diagnosis not present

## 2013-03-25 DIAGNOSIS — IMO0001 Reserved for inherently not codable concepts without codable children: Secondary | ICD-10-CM | POA: Diagnosis not present

## 2013-03-28 ENCOUNTER — Ambulatory Visit (INDEPENDENT_AMBULATORY_CARE_PROVIDER_SITE_OTHER): Payer: Medicare Other | Admitting: Emergency Medicine

## 2013-03-28 ENCOUNTER — Telehealth: Payer: Self-pay

## 2013-03-28 VITALS — BP 124/76 | HR 111 | Temp 97.5°F | Resp 16 | Ht 63.25 in | Wt 152.0 lb

## 2013-03-28 DIAGNOSIS — IMO0001 Reserved for inherently not codable concepts without codable children: Secondary | ICD-10-CM | POA: Diagnosis not present

## 2013-03-28 DIAGNOSIS — M1990 Unspecified osteoarthritis, unspecified site: Secondary | ICD-10-CM | POA: Diagnosis not present

## 2013-03-28 DIAGNOSIS — M25559 Pain in unspecified hip: Secondary | ICD-10-CM | POA: Diagnosis not present

## 2013-03-28 DIAGNOSIS — Z9181 History of falling: Secondary | ICD-10-CM | POA: Diagnosis not present

## 2013-03-28 DIAGNOSIS — S72009D Fracture of unspecified part of neck of unspecified femur, subsequent encounter for closed fracture with routine healing: Secondary | ICD-10-CM | POA: Diagnosis not present

## 2013-03-28 DIAGNOSIS — I499 Cardiac arrhythmia, unspecified: Secondary | ICD-10-CM

## 2013-03-28 DIAGNOSIS — Z602 Problems related to living alone: Secondary | ICD-10-CM | POA: Diagnosis not present

## 2013-03-28 NOTE — Telephone Encounter (Signed)
Francisco Capuchin is calling bc she works with [facility] with [facility] and she wanted to report that Katrina Velez had a irregular heart rate today and its not normal for her to have one.  If anyone has questions they can call her at [phone]

## 2013-03-28 NOTE — Telephone Encounter (Signed)
Can she come in today? Called her, and she is on her way.

## 2013-03-28 NOTE — Patient Instructions (Signed)
Palpitations   A palpitation is the feeling that your heartbeat is irregular or is faster than normal. It may feel like your heart is fluttering or skipping a beat. Palpitations are usually not a serious problem. However, in some cases, you may need further medical evaluation.  CAUSES   Palpitations can be caused by:   Smoking.   Caffeine or other stimulants, such as diet pills or energy drinks.   Alcohol.   Stress and anxiety.   Strenuous physical activity.   Fatigue.   Certain medicines.   Heart disease, especially if you have a history of arrhythmias. This includes atrial fibrillation, atrial flutter, or supraventricular tachycardia.   An improperly working pacemaker or defibrillator.  DIAGNOSIS   To find the cause of your palpitations, your caregiver will take your history and perform a physical exam. Tests may also be done, including:   Electrocardiography (ECG). This test records the heart's electrical activity.   Cardiac monitoring. This allows your caregiver to monitor your heart rate and rhythm in real time.   Holter monitor. This is a portable device that records your heartbeat and can help diagnose heart arrhythmias. It allows your caregiver to track your heart activity for several days, if needed.   Stress tests by exercise or by giving medicine that makes the heart beat faster.  TREATMENT   Treatment of palpitations depends on the cause of your symptoms and can vary greatly. Most cases of palpitations do not require any treatment other than time, relaxation, and monitoring your symptoms. Other causes, such as atrial fibrillation, atrial flutter, or supraventricular tachycardia, usually require further treatment.  HOME CARE INSTRUCTIONS    Avoid:   Caffeinated coffee, tea, soft drinks, diet pills, and energy drinks.   Chocolate.   Alcohol.   Stop smoking if you smoke.   Reduce your stress and anxiety. Things that can help you relax include:   A method that measures bodily functions so  you can learn to control them (biofeedback).   Yoga.   Meditation.   Physical activity such as swimming, jogging, or walking.   Get plenty of rest and sleep.  SEEK MEDICAL CARE IF:    You continue to have a fast or irregular heartbeat beyond 24 hours.   Your palpitations occur more often.  SEEK IMMEDIATE MEDICAL CARE IF:   You develop chest pain or shortness of breath.   You have a severe headache.   You feel dizzy, or you faint.  MAKE SURE YOU:   Understand these instructions.   Will watch your condition.   Will get help right away if you are not doing well or get worse.  Document Released: 05/05/2000 Document Revised: 09/02/2012 Document Reviewed: 07/07/2011  ExitCare Patient Information 2014 [employer].

## 2013-03-28 NOTE — Progress Notes (Addendum)
Subjective:    Patient ID: Katrina Velez, female    DOB: 06/03/1925, 80 y.o.   MRN: [id number]  HPI This chart was scribed for Lesle Chris, MD by Ladona Ridgel Konya Fauble, Scribe. This patient was seen in room 9 and the patient's care was started at 4:22 PM.  HPI Comments: Katrina Velez is a 79 y.o. female who presents to the [facility] complaining of irregular heart rhythm per home nurse who has been checking on her since she is recovering from a femoral fx (no surgical intervention). She denies any associated palpitations, SOB, CP, dizziness and no weakness. She states that she feels fine. She has no hear hx and no hx of CVA.   She reports recovering well with her physical therapy and using a cane for assistance with ambulation.  Past Medical History  Diagnosis Date  . Arthritis   . Cataract   . Thyroid disease     Past Surgical History  Procedure Laterality Date  . Eye surgery    . Hernia repair    . Knee surgery    . Wrist surgery      both wrists  . Right hip replacement      History reviewed. No pertinent family history.  History   Social History  . Marital Status: Widowed    Spouse Name: N/A    Number of Children: N/A  . Years of Education: N/A   Occupational History  . Not on file.   Social History Main Topics  . Smoking status: Former Games developer  . Smokeless tobacco: Not on file  . Alcohol Use: Yes  . Drug Use: No  . Sexual Activity: Not on file   Other Topics Concern  . Not on file   Social History Narrative  . No narrative on file    Allergies  Allergen Reactions  . Banana Nausea Only    All banana products also  . Penicillins Hives    Patient Active Problem List   Diagnosis Date Noted  . Closed left hip fracture 02/23/2013  . Elevated glucose 03/26/2012  . Hypothyroidism 01/31/2012  . COPD (chronic obstructive pulmonary disease) 01/31/2012  . Nicotine addiction 01/31/2012  . Inflammatory arthritis 01/31/2012    Results for orders  placed during the hospital encounter of 02/22/13  CBC WITH DIFFERENTIAL      Result Value Range   WBC 10.6 (*) 4.0 - 10.5 K/uL   RBC 4.54  3.87 - 5.11 MIL/uL   Hemoglobin 13.6  12.0 - 15.0 g/dL   HCT 29.5  28.4 - 13.2 %   MCV 88.5  78.0 - 100.0 fL   MCH 30.0  26.0 - 34.0 pg   MCHC 33.8  30.0 - 36.0 g/dL   RDW 44.0  10.2 - 72.5 %   Platelets 341  150 - 400 K/uL   Neutrophils Relative % 70  43 - 77 %   Neutro Abs 7.5  1.7 - 7.7 K/uL   Lymphocytes Relative 17  12 - 46 %   Lymphs Abs 1.8  0.7 - 4.0 K/uL   Monocytes Relative 10  3 - 12 %   Monocytes Absolute 1.1 (*) 0.1 - 1.0 K/uL   Eosinophils Relative 3  0 - 5 %   Eosinophils Absolute 0.3  0.0 - 0.7 K/uL   Basophils Relative 0  0 - 1 %   Basophils Absolute 0.0  0.0 - 0.1 K/uL  BASIC METABOLIC PANEL      Result Value Range  Sodium 141  135 - 145 mEq/L   Potassium 4.4  3.5 - 5.1 mEq/L   Chloride 104  96 - 112 mEq/L   CO2 26  19 - 32 mEq/L   Glucose, Bld 109 (*) 70 - 99 mg/dL   BUN 23  6 - 23 mg/dL   Creatinine, Ser 1.91  0.50 - 1.10 mg/dL   Calcium 9.2  8.4 - 47.8 mg/dL   GFR calc non Af Amer 76 (*) >90 mL/min   GFR calc Af Amer 88 (*) >90 mL/min    No diagnosis found.  No orders of the defined types were placed in this encounter.     Review of Systems  Constitutional: Negative for fever.  Respiratory: Negative for shortness of breath.   Cardiovascular: Negative for chest pain.  Neurological: Negative for dizziness.   Triage Vitals: BP 124/76  Pulse 111  Temp(Src) 97.5 F (36.4 C) (Oral)  Resp 16  Ht 5' 3.25" (1.607 m)  Wt 152 lb (68.947 kg)  BMI 26.70 kg/m2  SpO2 98%     Objective:   Physical Exam Physical Exam  Nursing note and vitals reviewed. Constitutional: Patient is oriented to person, place, and time. Patient appears well-developed and well-nourished. No distress.  HENT:  Head: Normocephalic and atraumatic.  Neck: Neck supple. No tracheal deviation present.  Cardiovascular: Normal rate, regular  rhythm and normal heart sounds.   No murmur heard. Pulmonary/Chest: Effort normal and breath sounds normal. No respiratory distress. Patient has no wheezes. Patient has no rales.  Musculoskeletal: Normal range of motion.  Neurological: Patient is alert and oriented to person, place, and time.  Skin: Skin is warm and dry.  Psychiatric: Patient has a normal mood and affect. Patient's behavior is normal.   EKG no acute changes one PAC noted to have a rapid rate of 96       Assessment & Plan:  No treatment necessary she had one PAC noted on her EKG.

## 2013-04-02 DIAGNOSIS — M25559 Pain in unspecified hip: Secondary | ICD-10-CM | POA: Diagnosis not present

## 2013-04-02 DIAGNOSIS — Z602 Problems related to living alone: Secondary | ICD-10-CM | POA: Diagnosis not present

## 2013-04-02 DIAGNOSIS — IMO0001 Reserved for inherently not codable concepts without codable children: Secondary | ICD-10-CM | POA: Diagnosis not present

## 2013-04-02 DIAGNOSIS — Z9181 History of falling: Secondary | ICD-10-CM | POA: Diagnosis not present

## 2013-04-02 DIAGNOSIS — S72009D Fracture of unspecified part of neck of unspecified femur, subsequent encounter for closed fracture with routine healing: Secondary | ICD-10-CM | POA: Diagnosis not present

## 2013-04-02 DIAGNOSIS — M1990 Unspecified osteoarthritis, unspecified site: Secondary | ICD-10-CM | POA: Diagnosis not present

## 2013-04-04 DIAGNOSIS — M25559 Pain in unspecified hip: Secondary | ICD-10-CM | POA: Diagnosis not present

## 2013-04-04 DIAGNOSIS — IMO0001 Reserved for inherently not codable concepts without codable children: Secondary | ICD-10-CM | POA: Diagnosis not present

## 2013-04-04 DIAGNOSIS — S72009D Fracture of unspecified part of neck of unspecified femur, subsequent encounter for closed fracture with routine healing: Secondary | ICD-10-CM | POA: Diagnosis not present

## 2013-04-04 DIAGNOSIS — Z9181 History of falling: Secondary | ICD-10-CM | POA: Diagnosis not present

## 2013-04-04 DIAGNOSIS — M1990 Unspecified osteoarthritis, unspecified site: Secondary | ICD-10-CM | POA: Diagnosis not present

## 2013-04-04 DIAGNOSIS — Z602 Problems related to living alone: Secondary | ICD-10-CM | POA: Diagnosis not present

## 2013-04-15 DIAGNOSIS — M25559 Pain in unspecified hip: Secondary | ICD-10-CM | POA: Diagnosis not present

## 2013-05-03 ENCOUNTER — Telehealth: Payer: Self-pay

## 2013-05-03 DIAGNOSIS — E039 Hypothyroidism, unspecified: Secondary | ICD-10-CM

## 2013-05-03 NOTE — Telephone Encounter (Signed)
Pt would to have a refill on Synthroid. Best# [phone]

## 2013-05-04 ENCOUNTER — Ambulatory Visit (INDEPENDENT_AMBULATORY_CARE_PROVIDER_SITE_OTHER): Payer: Medicare Other | Admitting: Emergency Medicine

## 2013-05-04 VITALS — BP 120/66 | HR 72 | Temp 97.2°F | Resp 16 | Ht 64.0 in | Wt 151.0 lb

## 2013-05-04 DIAGNOSIS — E039 Hypothyroidism, unspecified: Secondary | ICD-10-CM

## 2013-05-04 DIAGNOSIS — I491 Atrial premature depolarization: Secondary | ICD-10-CM

## 2013-05-04 LAB — POCT CBC
Granulocyte percent: 70.2 %G (ref 37–80)
HCT, POC: 45.2 % (ref 37.7–47.9)
MCHC: 31.2 g/dL — AB (ref 31.8–35.4)
MCV: 93.8 fL (ref 80–97)
POC LYMPH PERCENT: 22.5 %L (ref 10–50)
RBC: 4.82 M/uL (ref 4.04–5.48)
RDW, POC: 14.4 %

## 2013-05-04 LAB — COMPREHENSIVE METABOLIC PANEL
Albumin: 4 g/dL (ref 3.5–5.2)
Alkaline Phosphatase: 69 U/L (ref 39–117)
BUN: 13 mg/dL (ref 6–23)
Calcium: 9.6 mg/dL (ref 8.4–10.5)
Chloride: 104 mEq/L (ref 96–112)
Glucose, Bld: 96 mg/dL (ref 70–99)
Potassium: 4.4 mEq/L (ref 3.5–5.3)

## 2013-05-04 LAB — T4, FREE: Free T4: 1.51 ng/dL (ref 0.80–1.80)

## 2013-05-04 MED ORDER — LEVOTHYROXINE SODIUM 175 MCG PO TABS: 175.0000 ug | ORAL_TABLET | Freq: Every day | ORAL | Status: DC

## 2013-05-04 NOTE — Progress Notes (Addendum)
Subjective:   This chart was scribed for Lesle Chris, MD at [facility] by Yevette Edwards, Scribe. This pt's care was started at 9:03 AM.   Patient ID: Katrina Velez, female    DOB: 14-Mar-1926, 79 y.o.   MRN: [id number]  Chief Complaint  Patient presents with  . medication refills    Synthroid      HPI  Katrina Velez is a 79 y.o. female, with a h/o hypothyroidism, who presents to [facility] for a refill of Synthroid medication.   She reports that, overall, she is feeling "good." She reports her left leg is slightly weak, but that it is otherwise fine.  The pt had an influenza vaccination this year. She also had a pneumonia vaccination. However, she reports an episode of pnenumonia two years ago.   The pt has not smoked tobacco for two years; she currently smokes e-cigarettes.   Past Medical History  Diagnosis Date  . Arthritis   . Cataract   . Thyroid disease    Current Outpatient Prescriptions on File Prior to Visit  Medication Sig Dispense Refill  . ascorbic acid (VITAMIN C) 500 MG tablet Take 1,000 mg by mouth daily.      Marland Kitchen aspirin 325 MG EC tablet Take 925 mg by mouth daily.      . DiphenhydrAMINE HCl, Sleep, (ZZZQUIL) 25 MG CAPS Take 25 mg by mouth at bedtime as needed (for sleep).      . hydrocortisone cream 1 % Apply 1 application topically daily.      Marland Kitchen ketotifen (ALAWAY) 0.025 % ophthalmic solution Place 2 drops into both eyes daily.      Marland Kitchen levothyroxine (SYNTHROID, LEVOTHROID) 175 MCG tablet Take 175 mcg by mouth daily before breakfast.      . Multiple Vitamin (MULTIVITAMIN) capsule Take 1 capsule by mouth daily.      Bertram Gala Glycol-Propyl Glycol (SYSTANE) 0.4-0.3 % SOLN Apply 2 drops to eye 2 (two) times daily as needed (for dry eyes).      . triamcinolone cream (KENALOG) 0.1 % Apply 1 application topically daily as needed (for excema).      . vitamin E 400 UNIT capsule Take 400 Units by mouth daily.      . cyanocobalamin 500 MCG tablet Take 500 mcg  by mouth daily.      Marland Kitchen docusate sodium 100 MG CAPS Take 100 mg by mouth 2 (two) times daily.  10 capsule  0  . HYDROcodone-acetaminophen (NORCO/VICODIN) 5-325 MG per tablet Take 1-2 tablets by mouth every 6 (six) hours as needed.  30 tablet  0  . OVER THE COUNTER MEDICATION Take 1 application by mouth daily as needed (for tooth pain). "ambesol"       No current facility-administered medications on file prior to visit.   Review of Systems  Cardiovascular:       The pt was seen here several months ago after having an irregular heart rhythm, but EKG at that time showed only one PAC.   Neurological: Positive for weakness (Left leg).       Objective:   Physical Exam   Physical Exam  Nursing note and vitals reviewed. Constitutional: She is oriented to person, place, and time. She appears well-developed and well-nourished. No distress.  HENT:  Head: Normocephalic and atraumatic.  Eyes: EOM are normal.  Neck: Neck supple. No tracheal deviation present.  Cardiovascular: Normal rate.   Pulmonary/Chest: Effort normal. No respiratory distress.  Musculoskeletal: Normal range of motion.  Neurological:  She is alert and oriented to person, place, and time.  Skin: Skin is warm and dry.  Psychiatric: She has a normal mood and affect. Her behavior is normal.   BP 120/66  Pulse 72  Temp(Src) 97.2 F (36.2 C) (Oral)  Resp 16  Ht 5\' 4"  (1.626 m)  Wt 151 lb (68.493 kg)  BMI 25.91 kg/m2  SpO2 97% Results for orders placed in visit on 05/04/13  POCT CBC      Result Value Range   WBC 6.9  4.6 - 10.2 K/uL   Lymph, poc 1.6  0.6 - 3.4   POC LYMPH PERCENT 22.5  10 - 50 %L   MID (cbc) 0.5  0 - 0.9   POC MID % 7.3  0 - 12 %M   POC Granulocyte 4.8  2 - 6.9   Granulocyte percent 70.2  37 - 80 %G   RBC 4.82  4.04 - 5.48 M/uL   Hemoglobin 14.1  12.2 - 16.2 g/dL   HCT, POC 78.4  69.6 - 47.9 %   MCV 93.8  80 - 97 fL   MCH, POC 29.3  27 - 31.2 pg   MCHC 31.2 (*) 31.8 - 35.4 g/dL   RDW, POC 29.5      Platelet Count, POC 379  142 - 424 K/uL   MPV 8.9  0 - 99.8 fL      Assessment & Plan:  Meds were refilled. Thyroid studies were done . She is currently not smoking. She is up-to-date on her colonoscopy. She would not let me order a mammogram. 9:10 AM- Discussed treatment plan with patient, and the patient agreed to the plan.

## 2013-05-05 NOTE — Telephone Encounter (Signed)
Patient was seen on 05-04-2013 after this phone call for her refill.

## 2013-05-06 MED ORDER — LEVOTHYROXINE SODIUM 175 MCG PO TABS: 175.0000 ug | ORAL_TABLET | Freq: Every day | ORAL | Status: DC

## 2013-05-06 NOTE — Telephone Encounter (Signed)
Rx changed for her. Called to advise.

## 2013-05-06 NOTE — Telephone Encounter (Signed)
Spoke with patient about labs today.  States her synthroid was for 30 days and she needs a 90 day instead.

## 2013-07-29 ENCOUNTER — Telehealth: Payer: Self-pay | Admitting: Radiology

## 2013-07-29 NOTE — Telephone Encounter (Signed)
Letter sent per patient request to [facility] in regards to her Synthroid.

## 2013-07-30 NOTE — Telephone Encounter (Signed)
Dr Cleta Albertsaub and Amy, I received denial of lower tier co-pay for pt's Synthroid. Letter stated that pt is paying lowest co-pay allowed for pt's plan and does not have anything to do with medical necessity. I have put letter in Dr Ellis Parentsaub's box for review.

## 2013-07-30 NOTE — Telephone Encounter (Signed)
Noted, thanks!

## 2014-01-13 DIAGNOSIS — H4320 Crystalline deposits in vitreous body, unspecified eye: Secondary | ICD-10-CM | POA: Diagnosis not present

## 2014-01-13 DIAGNOSIS — H02839 Dermatochalasis of unspecified eye, unspecified eyelid: Secondary | ICD-10-CM | POA: Diagnosis not present

## 2014-01-13 DIAGNOSIS — H04129 Dry eye syndrome of unspecified lacrimal gland: Secondary | ICD-10-CM | POA: Diagnosis not present

## 2014-01-13 DIAGNOSIS — H1045 Other chronic allergic conjunctivitis: Secondary | ICD-10-CM | POA: Diagnosis not present

## 2014-01-13 DIAGNOSIS — H43819 Vitreous degeneration, unspecified eye: Secondary | ICD-10-CM | POA: Diagnosis not present

## 2014-01-13 DIAGNOSIS — Z961 Presence of intraocular lens: Secondary | ICD-10-CM | POA: Diagnosis not present

## 2014-05-01 DIAGNOSIS — Z23 Encounter for immunization: Secondary | ICD-10-CM | POA: Diagnosis not present

## 2014-05-27 ENCOUNTER — Ambulatory Visit (INDEPENDENT_AMBULATORY_CARE_PROVIDER_SITE_OTHER): Payer: Medicare Other

## 2014-05-27 ENCOUNTER — Ambulatory Visit (INDEPENDENT_AMBULATORY_CARE_PROVIDER_SITE_OTHER): Payer: Medicare Other | Admitting: Emergency Medicine

## 2014-05-27 VITALS — BP 128/80 | HR 75 | Temp 98.0°F | Resp 17 | Ht 64.5 in | Wt 155.0 lb

## 2014-05-27 DIAGNOSIS — J441 Chronic obstructive pulmonary disease with (acute) exacerbation: Secondary | ICD-10-CM

## 2014-05-27 DIAGNOSIS — E038 Other specified hypothyroidism: Secondary | ICD-10-CM

## 2014-05-27 DIAGNOSIS — Z23 Encounter for immunization: Secondary | ICD-10-CM

## 2014-05-27 DIAGNOSIS — E039 Hypothyroidism, unspecified: Secondary | ICD-10-CM | POA: Diagnosis not present

## 2014-05-27 DIAGNOSIS — Z720 Tobacco use: Secondary | ICD-10-CM

## 2014-05-27 DIAGNOSIS — R7309 Other abnormal glucose: Secondary | ICD-10-CM

## 2014-05-27 DIAGNOSIS — F17200 Nicotine dependence, unspecified, uncomplicated: Secondary | ICD-10-CM

## 2014-05-27 DIAGNOSIS — R9431 Abnormal electrocardiogram [ECG] [EKG]: Secondary | ICD-10-CM | POA: Diagnosis not present

## 2014-05-27 DIAGNOSIS — Z13220 Encounter for screening for lipoid disorders: Secondary | ICD-10-CM

## 2014-05-27 LAB — POCT CBC
GRANULOCYTE PERCENT: 69.3 % (ref 37–80)
HCT, POC: 41.1 % (ref 37.7–47.9)
Hemoglobin: 13.4 g/dL (ref 12.2–16.2)
Lymph, poc: 1.7 (ref 0.6–3.4)
MCH: 29.4 pg (ref 27–31.2)
MCHC: 32.6 g/dL (ref 31.8–35.4)
MCV: 90.3 fL (ref 80–97)
MID (cbc): 0.7 (ref 0–0.9)
MPV: 7.5 fL (ref 0–99.8)
PLATELET COUNT, POC: 355 10*3/uL (ref 142–424)
POC Granulocyte: 5.5 (ref 2–6.9)
POC LYMPH %: 21.3 % (ref 10–50)
POC MID %: 9.4 % (ref 0–12)
RBC: 4.55 M/uL (ref 4.04–5.48)
RDW, POC: 14.2 %
WBC: 7.9 10*3/uL (ref 4.6–10.2)

## 2014-05-27 LAB — COMPREHENSIVE METABOLIC PANEL
ALK PHOS: 81 U/L (ref 39–117)
ALT: 9 U/L (ref 0–35)
AST: 15 U/L (ref 0–37)
Albumin: 3.9 g/dL (ref 3.5–5.2)
BILIRUBIN TOTAL: 0.4 mg/dL (ref 0.2–1.2)
BUN: 12 mg/dL (ref 6–23)
CALCIUM: 10.1 mg/dL (ref 8.4–10.5)
CO2: 27 meq/L (ref 19–32)
Chloride: 104 mEq/L (ref 96–112)
Creat: 0.64 mg/dL (ref 0.50–1.10)
GLUCOSE: 93 mg/dL (ref 70–99)
POTASSIUM: 5 meq/L (ref 3.5–5.3)
Sodium: 140 mEq/L (ref 135–145)
Total Protein: 6.5 g/dL (ref 6.0–8.3)

## 2014-05-27 LAB — POCT UA - MICROSCOPIC ONLY
Bacteria, U Microscopic: NEGATIVE
CASTS, UR, LPF, POC: NEGATIVE
CRYSTALS, UR, HPF, POC: NEGATIVE
Mucus, UA: NEGATIVE
Yeast, UA: NEGATIVE

## 2014-05-27 LAB — POCT URINALYSIS DIPSTICK
BILIRUBIN UA: NEGATIVE
Blood, UA: NEGATIVE
Glucose, UA: NEGATIVE
Ketones, UA: NEGATIVE
NITRITE UA: NEGATIVE
PH UA: 6.5
Protein, UA: NEGATIVE
Spec Grav, UA: 1.01
UROBILINOGEN UA: 0.2

## 2014-05-27 LAB — LIPID PANEL
CHOL/HDL RATIO: 2.9 ratio
CHOLESTEROL: 193 mg/dL (ref 0–200)
HDL: 66 mg/dL (ref >39)
LDL CALC: 106 mg/dL — AB (ref 0–99)
Triglycerides: 107 mg/dL (ref <150)
VLDL: 21 mg/dL (ref 0–40)

## 2014-05-27 LAB — TSH: TSH: 0.014 u[IU]/mL — ABNORMAL LOW (ref 0.350–4.500)

## 2014-05-27 LAB — T4, FREE: FREE T4: 1.48 ng/dL (ref 0.80–1.80)

## 2014-05-27 MED ORDER — LEVOTHYROXINE SODIUM 175 MCG PO TABS: 175.0000 ug | ORAL_TABLET | Freq: Every day | ORAL | Status: DC

## 2014-05-27 MED ORDER — GABAPENTIN 100 MG PO CAPS: 100.0000 mg | ORAL_CAPSULE | Freq: Three times a day (TID) | ORAL | Status: DC

## 2014-05-27 NOTE — Progress Notes (Addendum)
This chart was scribed for Collene GobbleSteven A Daub, MD by Tonye RoyaltyJoshua Chen, ED Scribe. This patient was seen in room 2 and the patient's care was started at 9:54 AM.   Subjective:    Patient ID: Katrina Velez, female    DOB: Feb 26, 1926, 81 y.o.   MRN: [id number]  HPI  HPI Comments: Katrina Velez is a 81 y.o. female with history of hypothyroidism and smoking who presents to the Emergency Department for Synthroid refill. She also complains of constant pain to her jaw with onset 3-4 years ago. She states she initially woke with a sharp pain and then was evaluated by the dentist who told her she has a damaged nerve. She states pain was initially intermittent and sharp, but is now aching and nearly constant. She states she has stopped smoking cigarettes, but uses e-cigarettes. She declines mammograms and states her GI told her she does not need to return for further screening. She states she has had shingles and pneumonia vaccines.  Review of Systems  Constitutional: Negative for fever.  HENT: Negative for dental problem.        Jaw pain       Objective:   Physical Exam  CONSTITUTIONAL: Well developed/well nourished HEAD: Normocephalic/atraumatic EYES: EOMI/PERRL ENMT: Mucous membranes moist NECK: supple no meningeal signs SPINE/BACK:entire spine nontender CV: S1/S2 noted, no murmurs/rubs/gallops noted LUNGS: Lungs are clear to auscultation bilaterally, no apparent distress there is an increased AP diameter with decreased breath sounds in the bases. ABDOMEN: soft, nontender, no rebound or guarding, bowel sounds noted throughout abdomen GU:no cva tenderness NEURO: Pt is awake/alert/appropriate, moves all extremitiesx4.  No facial droop.   EXTREMITIES: pulses normal/equal, full ROM SKIN: warm, color normal PSYCH: no abnormalities of mood noted, alert and oriented to situation  EKG there are some nonspecific ST-T changes inferior leads.  Results for orders placed or performed in visit on 05/27/14    POCT CBC  Result Value Ref Range   WBC 7.9 4.6 - 10.2 K/uL   Lymph, poc 1.7 0.6 - 3.4   POC LYMPH PERCENT 21.3 10 - 50 %L   MID (cbc) 0.7 0 - 0.9   POC MID % 9.4 0 - 12 %M   POC Granulocyte 5.5 2 - 6.9   Granulocyte percent 69.3 37 - 80 %G   RBC 4.55 4.04 - 5.48 M/uL   Hemoglobin 13.4 12.2 - 16.2 g/dL   HCT, POC 40.941.1 81.137.7 - 47.9 %   MCV 90.3 80 - 97 fL   MCH, POC 29.4 27 - 31.2 pg   MCHC 32.6 31.8 - 35.4 g/dL   RDW, POC 91.414.2 %   Platelet Count, POC 355 142 - 424 K/uL   MPV 7.5 0 - 99.8 fL   [facility] reading (PRIMARY) by  Dr. Cleta Albertsaub she has had bilateral shoulder replacements. There is sclerosis around the aorta. There is a patchy area of increased density right base please comment. There are no definite nodular lesions. Results for orders placed or performed in visit on 05/27/14  POCT CBC  Result Value Ref Range   WBC 7.9 4.6 - 10.2 K/uL   Lymph, poc 1.7 0.6 - 3.4   POC LYMPH PERCENT 21.3 10 - 50 %L   MID (cbc) 0.7 0 - 0.9   POC MID % 9.4 0 - 12 %M   POC Granulocyte 5.5 2 - 6.9   Granulocyte percent 69.3 37 - 80 %G   RBC 4.55 4.04 - 5.48 M/uL   Hemoglobin 13.4 12.2 -  16.2 g/dL   HCT, POC 16.1 09.6 - 47.9 %   MCV 90.3 80 - 97 fL   MCH, POC 29.4 27 - 31.2 pg   MCHC 32.6 31.8 - 35.4 g/dL   RDW, POC 04.5 %   Platelet Count, POC 355 142 - 424 K/uL   MPV 7.5 0 - 99.8 fL  POCT UA - Microscopic Only  Result Value Ref Range   WBC, Ur, HPF, POC 3-7    RBC, urine, microscopic 0-1    Bacteria, U Microscopic neg    Mucus, UA neg    Epithelial cells, urine per micros 2-6    Crystals, Ur, HPF, POC neg    Casts, Ur, LPF, POC neg    Yeast, UA neg   POCT urinalysis dipstick  Result Value Ref Range   Color, UA yellow    Clarity, UA clear    Glucose, UA neg    Bilirubin, UA neg    Ketones, UA neg    Spec Grav, UA 1.010    Blood, UA neg    pH, UA 6.5    Protein, UA neg    Urobilinogen, UA 0.2    Nitrite, UA neg    Leukocytes, UA small (1+)        Assessment & Plan:   She  will be given a Prevnar vaccine. Her thyroid medications were refilled. She stopped smoking 3 years ago. Referral made to cardiology for evaluation of her abnormal EKG. Patient also has a lot of jaw pain which has been felt to be secondary to nerve injury following a dental procedure. We'll give her a trial of Neurontin 100 mg 3 times a dayI personally performed the services described in this documentation, which was scribed in my presence. The recorded information has been reviewed and is accurate.

## 2014-06-01 ENCOUNTER — Telehealth: Payer: Self-pay

## 2014-06-01 NOTE — Telephone Encounter (Signed)
Pt would like to know if she can increase her NEURONTIN 100mg s and take more a day for her pain Please call [phone]

## 2014-06-02 NOTE — Telephone Encounter (Signed)
Pt has been advised.  She will let us know if she needs a refill.

## 2014-06-02 NOTE — Telephone Encounter (Signed)
ES she can increase her Neurontin and take 2 capsules 3 times a day and see if it helps

## 2014-06-05 NOTE — Telephone Encounter (Signed)
Pt states that she has been having strange unusual dreams and upset stomach.  She states that she is still having pain- they are not relieving pain even since increase dose. Pt advised to stop taking medication due to SI she has been experiencing.

## 2014-06-05 NOTE — Telephone Encounter (Signed)
Pt called. States neurontin is not agreeing with her and wants to stop taking it. Please advise. CB # [phone]

## 2014-06-05 NOTE — Telephone Encounter (Signed)
It sounds like she is not going to tolerate the medication. She just needs to stop it

## 2014-06-06 NOTE — Telephone Encounter (Signed)
LMOM to stop medication.

## 2014-06-21 NOTE — Progress Notes (Signed)
Patient ID: Katrina Velez, female   DOB: Nov 28, 1925, 81 y.o.   MRN: [id number]    Cardiology Office Note   Date:  06/21/2014   ID:  Katrina Velez, DOB Nov 28, 1925, MRN [id number]  PCP:  Lucilla EdinAUB, STEVE A, MD  Cardiologist:   Charlton HawsPeter India Jolin, MD   No chief complaint on file.     History of Present Illness: Katrina Velez is a 81 y.o. female who presents for evaluation of abnormal ECG  Seen by Dr Cleta Albertsaub 05/27/14 for jaw pain.  Chronic from previous dental procedure  Started on neurontin.  Previous smoker quit 3 years ago  CXR 05/27/14 NAD Labs reviewed and TSH suppressed .014 with normal range freee T4  LDL 103  Synthroid dose not decreased.      Past Medical History  Diagnosis Date  . Arthritis   . Cataract   . Thyroid disease     Past Surgical History  Procedure Laterality Date  . Eye surgery    . Hernia repair    . Knee surgery    . Wrist surgery      both wrists  . Right hip replacement       Current Outpatient Prescriptions  Medication Sig Dispense Refill  . ascorbic acid (VITAMIN C) 500 MG tablet Take 1,000 mg by mouth daily.    Marland Kitchen. aspirin 325 MG EC tablet Take 925 mg by mouth daily.    . cyanocobalamin 500 MCG tablet Take 500 mcg by mouth daily.    . DiphenhydrAMINE HCl, Sleep, (ZZZQUIL) 25 MG CAPS Take 25 mg by mouth at bedtime as needed (for sleep).    . docusate sodium 100 MG CAPS Take 100 mg by mouth 2 (two) times daily. (Patient not taking: Reported on 05/27/2014) 10 capsule 0  . gabapentin (NEURONTIN) 100 MG capsule Take 1 capsule (100 mg total) by mouth 3 (three) times daily. 90 capsule 11  . HYDROcodone-acetaminophen (NORCO/VICODIN) 5-325 MG per tablet Take 1-2 tablets by mouth every 6 (six) hours as needed. (Patient not taking: Reported on 05/27/2014) 30 tablet 0  . hydrocortisone cream 1 % Apply 1 application topically daily.    Marland Kitchen. ketotifen (ALAWAY) 0.025 % ophthalmic solution Place 2 drops into both eyes daily.    Marland Kitchen. levothyroxine (SYNTHROID, LEVOTHROID) 175 MCG tablet Take 1  tablet (175 mcg total) by mouth daily before breakfast. 90 tablet 3  . Multiple Vitamin (MULTIVITAMIN) capsule Take 1 capsule by mouth daily.    Marland Kitchen. OVER THE COUNTER MEDICATION Take 1 application by mouth daily as needed (for tooth pain). "ambesol"    . Polyethyl Glycol-Propyl Glycol (SYSTANE) 0.4-0.3 % SOLN Apply 2 drops to eye 2 (two) times daily as needed (for dry eyes).    . triamcinolone cream (KENALOG) 0.1 % Apply 1 application topically daily as needed (for excema).    . vitamin E 400 UNIT capsule Take 400 Units by mouth daily.     No current facility-administered medications for this visit.    Allergies:   Banana and Penicillins    Social History:  The patient  reports that she quit smoking about 3 years ago. Her smoking use included E-cigarettes. She does not have any smokeless tobacco history on file. She reports that she drinks alcohol. She reports that she does not use illicit drugs.   Family History:  The patient's family history is not on file.   She indicates no premature CAD    ROS:  Please see the history of present illness.   Otherwise, review  of systems are positive for none.   All other systems are reviewed and negative.    PHYSICAL EXAM: VS:  There were no vitals taken for this visit. , BMI There is no weight on file to calculate BMI. GEN: Well nourished, well developed, in no acute distress HEENT: normal Neck: no JVD, carotid bruits, or masses Cardiac: RRR; no murmurs, rubs, or gallops,no edema  Respiratory:  clear to auscultation bilaterally, normal work of breathing GI: soft, nontender, nondistended, + BS MS: no deformity or atrophy Skin: warm and dry, no rash Neuro:  Strength and sensation are intact Psych: euthymic mood, full affect Multiple previous orhto surgeries both sholders/ right hip and both knees    EKG:  05/27/14  SB 59 nonspecific ST/T wave changes   03/28/13  SR rate 96 normal  02/23/13  SR rate 76 nonspecific ST changes similar to  05/27/14   Recent Labs: 05/27/2014: ALT 9; BUN 12; Creatinine 0.64; Hemoglobin 13.4; Potassium 5.0; Sodium 140; TSH 0.014*    Lipid Panel    Component Value Date/Time   CHOL 193 05/27/2014 1043   TRIG 107 05/27/2014 1043   HDL 66 05/27/2014 1043   CHOLHDL 2.9 05/27/2014 1043   VLDL 21 05/27/2014 1043   LDLCALC 106* 05/27/2014 1043      Wt Readings from Last 3 Encounters:  05/27/14 70.308 kg (155 lb)  05/04/13 68.493 kg (151 lb)  03/28/13 68.947 kg (152 lb)      Other studies Reviewed: Additional studies/ records that were reviewed today include:  EPIC notes from primary .    ASSESSMENT AND PLAN:  1.  Abnormal ECG:  Nonspecific ST changes No real difference from 2014  She is asymptomatic with normal cardiac exam and no documented history of cardiac issues No further w/u is indicated  2. Jaw Pain: major quality of life issue  Gave her Denese Killings name as 2nd opinion on RX  She stopped neurontin on her own as it did not help pain and only made her dizzy 3. Thyroid  Consider decreasing dose with surpressed TSH  F/u Dr Cleta Alberts   Current medicines are reviewed at length with the patient today.  The patient does not have concerns regarding medicines.  The following changes have been made:  no change  Labs/ tests ordered today include: None   No orders of the defined types were placed in this encounter.     Disposition:   FU with me PRN     Signed, Charlton Haws, MD  06/21/2014 3:58 PM    [facility] [address] [place], [place], [place]  [address] Phone: [phone]; Fax: [phone]

## 2014-06-22 ENCOUNTER — Ambulatory Visit (INDEPENDENT_AMBULATORY_CARE_PROVIDER_SITE_OTHER): Payer: Medicare Other | Admitting: Cardiovascular Disease

## 2014-06-22 ENCOUNTER — Encounter: Payer: Self-pay | Admitting: Cardiovascular Disease

## 2014-06-22 VITALS — BP 138/78 | HR 67 | Ht 64.5 in | Wt 158.8 lb

## 2014-06-22 DIAGNOSIS — R9431 Abnormal electrocardiogram [ECG] [EKG]: Secondary | ICD-10-CM

## 2014-06-22 NOTE — Patient Instructions (Signed)
Your physician recommends that you schedule a follow-up appointment in: AS NEEDED  Your physician recommends that you continue on your current medications as directed. Please refer to the Current Medication list given to you today.  

## 2015-02-23 ENCOUNTER — Encounter: Payer: Self-pay | Admitting: Emergency Medicine

## 2015-03-11 ENCOUNTER — Telehealth: Payer: Self-pay | Admitting: Family Medicine

## 2015-03-11 NOTE — Telephone Encounter (Signed)
PATIENT RETURNED CALL AND SHE REFUSES TO COME IN UNTIL HER PRESCRIPTIONS RUN OUT.  SHE STATES THAT SHE ONLY COMES IN ONCE A YEAR.;

## 2015-03-11 NOTE — Telephone Encounter (Signed)
LEFT A MESSAGE FOR PATIENT TO CALL AND SET UP APPOINTMENT FALLS, TOBACCO USE, AND SCREENING FOR CLINICAL DEPRESSION AND FOLLOW UP PL[facility]

## 2015-03-15 ENCOUNTER — Ambulatory Visit (INDEPENDENT_AMBULATORY_CARE_PROVIDER_SITE_OTHER): Payer: Medicare Other | Admitting: Family Medicine

## 2015-03-15 VITALS — BP 128/76 | HR 74 | Temp 98.4°F | Resp 16 | Ht 63.0 in | Wt 157.0 lb

## 2015-03-15 DIAGNOSIS — Z23 Encounter for immunization: Secondary | ICD-10-CM

## 2015-03-15 DIAGNOSIS — J209 Acute bronchitis, unspecified: Secondary | ICD-10-CM | POA: Diagnosis not present

## 2015-03-15 MED ORDER — HYDROCODONE-HOMATROPINE 5-1.5 MG/5ML PO SYRP: 5.0000 mL | ORAL_SOLUTION | Freq: Three times a day (TID) | ORAL | Status: DC | PRN

## 2015-03-15 MED ORDER — LIDOCAINE VISCOUS 2 % MT SOLN: 20.0000 mL | OROMUCOSAL | Status: DC | PRN

## 2015-03-15 NOTE — Progress Notes (Signed)
° °  Subjective:    Patient ID: Katrina Velez, female    DOB: [birth year], 81 y.o.   MRN: [id number] This chart was scribed for Elvina SidleKurt Lauenstein, MD by Littie Deedsichard Sun, Medical Scribe. This patient was seen in Room 4 and the patient's care was started at 3:54 PM.   HPI HPI Comments: Katrina Peachlice Elison is a 81 y.o. female who presents to the [facility] complaining of gradual onset, dry cough that started a few weeks ago. She had some tea with honey and lemon last night. Patient also reports having back pain, fatigue, SOB, and chest congestion. She notes having some SOB at home, but not when she is out of the house. Patient denies fever. She would also like a flu shot.  [place] She has been in [place] for the past 32 years.  Review of Systems  Constitutional: Positive for fatigue. Negative for fever.  HENT: Positive for congestion.   Respiratory: Positive for cough and shortness of breath.   Musculoskeletal: Positive for back pain.       Objective:   Physical Exam CONSTITUTIONAL: Well developed/well nourished HEAD: Normocephalic/atraumatic EYES: EOM/PERRL ENMT: Mucous membranes moist NECK: supple no meningeal signs SPINE: entire spine nontender CV: 1-2 systolic ejection murmur LUNGS: Few rales in the anterior chest bilaterally with deep breathing ABDOMEN: soft, nontender, no rebound or guarding GU: no cva tenderness NEURO: Pt is awake/alert, moves all extremitiesx4 EXTREMITIES: pulses normal, full ROM SKIN: warm, color normal PSYCH: no abnormalities of mood noted     Assessment & Plan:   By signing my name below, I, Littie Deedsichard Sun, attest that this documentation has been prepared under the direction and in the presence of Elvina SidleKurt Lauenstein, MD.  Electronically Signed: Littie Deedsichard Sun, Medical Scribe. 03/15/2015. 3:54 PM. This chart was scribed in my presence and reviewed by me personally.    ICD-9-CM ICD-10-CM   1. Acute bronchitis, unspecified organism 466.0  J20.9 HYDROcodone-homatropine (HYCODAN) 5-1.5 MG/5ML syrup  2. Need for prophylactic vaccination and inoculation against influenza V04.81 Z23 Flu Vaccine QUAD 36+ mos IM     Signed, Elvina SidleKurt Lauenstein, MD

## 2015-03-15 NOTE — Patient Instructions (Signed)

## 2015-04-08 DIAGNOSIS — Z961 Presence of intraocular lens: Secondary | ICD-10-CM | POA: Diagnosis not present

## 2015-04-08 DIAGNOSIS — H02831 Dermatochalasis of right upper eyelid: Secondary | ICD-10-CM | POA: Diagnosis not present

## 2015-04-08 DIAGNOSIS — H10413 Chronic giant papillary conjunctivitis, bilateral: Secondary | ICD-10-CM | POA: Diagnosis not present

## 2015-04-08 DIAGNOSIS — H04123 Dry eye syndrome of bilateral lacrimal glands: Secondary | ICD-10-CM | POA: Diagnosis not present

## 2015-04-08 DIAGNOSIS — H02834 Dermatochalasis of left upper eyelid: Secondary | ICD-10-CM | POA: Diagnosis not present

## 2015-05-06 ENCOUNTER — Other Ambulatory Visit: Payer: Self-pay | Admitting: Emergency Medicine

## 2015-05-10 ENCOUNTER — Telehealth: Payer: Self-pay | Admitting: Family Medicine

## 2015-05-10 NOTE — Telephone Encounter (Signed)
Spoke with patient and she has not fallen in the last year

## 2015-05-23 DIAGNOSIS — I4891 Unspecified atrial fibrillation: Secondary | ICD-10-CM

## 2015-05-23 HISTORY — DX: Unspecified atrial fibrillation: I48.91

## 2015-06-05 ENCOUNTER — Ambulatory Visit (INDEPENDENT_AMBULATORY_CARE_PROVIDER_SITE_OTHER): Payer: Medicare Other | Admitting: Physician Assistant

## 2015-06-05 ENCOUNTER — Emergency Department (HOSPITAL_COMMUNITY): Payer: Medicare Other

## 2015-06-05 ENCOUNTER — Inpatient Hospital Stay (HOSPITAL_COMMUNITY)
Admission: EM | Admit: 2015-06-05 | Discharge: 2015-06-08 | DRG: 308 | Disposition: A | Discharge status: Home / Self Care | Payer: Medicare Other | Attending: Internal Medicine | Admitting: Internal Medicine

## 2015-06-05 ENCOUNTER — Encounter (HOSPITAL_COMMUNITY): Payer: Self-pay | Admitting: Emergency Medicine

## 2015-06-05 VITALS — BP 120/82 | HR 151 | Temp 97.4°F | Resp 18 | Ht 63.5 in | Wt 161.2 lb

## 2015-06-05 DIAGNOSIS — E039 Hypothyroidism, unspecified: Secondary | ICD-10-CM | POA: Diagnosis present

## 2015-06-05 DIAGNOSIS — R000 Tachycardia, unspecified: Secondary | ICD-10-CM | POA: Diagnosis not present

## 2015-06-05 DIAGNOSIS — Z87891 Personal history of nicotine dependence: Secondary | ICD-10-CM | POA: Diagnosis not present

## 2015-06-05 DIAGNOSIS — Z91018 Allergy to other foods: Secondary | ICD-10-CM

## 2015-06-05 DIAGNOSIS — R002 Palpitations: Secondary | ICD-10-CM | POA: Diagnosis not present

## 2015-06-05 DIAGNOSIS — I4891 Unspecified atrial fibrillation: Secondary | ICD-10-CM | POA: Diagnosis present

## 2015-06-05 DIAGNOSIS — J449 Chronic obstructive pulmonary disease, unspecified: Secondary | ICD-10-CM | POA: Diagnosis not present

## 2015-06-05 DIAGNOSIS — I509 Heart failure, unspecified: Secondary | ICD-10-CM | POA: Diagnosis not present

## 2015-06-05 DIAGNOSIS — I110 Hypertensive heart disease with heart failure: Secondary | ICD-10-CM | POA: Diagnosis not present

## 2015-06-05 DIAGNOSIS — J810 Acute pulmonary edema: Secondary | ICD-10-CM | POA: Diagnosis not present

## 2015-06-05 DIAGNOSIS — I5041 Acute combined systolic (congestive) and diastolic (congestive) heart failure: Secondary | ICD-10-CM | POA: Diagnosis not present

## 2015-06-05 DIAGNOSIS — R05 Cough: Secondary | ICD-10-CM

## 2015-06-05 DIAGNOSIS — R059 Cough, unspecified: Secondary | ICD-10-CM

## 2015-06-05 DIAGNOSIS — Z7982 Long term (current) use of aspirin: Secondary | ICD-10-CM

## 2015-06-05 DIAGNOSIS — E079 Disorder of thyroid, unspecified: Secondary | ICD-10-CM | POA: Diagnosis present

## 2015-06-05 DIAGNOSIS — I480 Paroxysmal atrial fibrillation: Principal | ICD-10-CM | POA: Diagnosis present

## 2015-06-05 DIAGNOSIS — Z79899 Other long term (current) drug therapy: Secondary | ICD-10-CM

## 2015-06-05 DIAGNOSIS — M1990 Unspecified osteoarthritis, unspecified site: Secondary | ICD-10-CM | POA: Diagnosis present

## 2015-06-05 DIAGNOSIS — Z880 Allergy status to penicillin: Secondary | ICD-10-CM

## 2015-06-05 DIAGNOSIS — Z96641 Presence of right artificial hip joint: Secondary | ICD-10-CM | POA: Diagnosis present

## 2015-06-05 LAB — I-STAT TROPONIN, ED: TROPONIN I, POC: 0.01 ng/mL (ref 0.00–0.08)

## 2015-06-05 LAB — BASIC METABOLIC PANEL
ANION GAP: 8 (ref 5–15)
BUN: 13 mg/dL (ref 6–20)
CALCIUM: 9.4 mg/dL (ref 8.9–10.3)
CO2: 25 mmol/L (ref 22–32)
Chloride: 107 mmol/L (ref 101–111)
Creatinine, Ser: 0.78 mg/dL (ref 0.44–1.00)
GFR calc Af Amer: >60 mL/min (ref >60)
Glucose, Bld: 103 mg/dL — ABNORMAL HIGH (ref 65–99)
POTASSIUM: 4.7 mmol/L (ref 3.5–5.1)
SODIUM: 140 mmol/L (ref 135–145)

## 2015-06-05 LAB — MAGNESIUM: Magnesium: 2.1 mg/dL (ref 1.7–2.4)

## 2015-06-05 LAB — CBC WITH DIFFERENTIAL/PLATELET
BASOS ABS: 0 10*3/uL (ref 0.0–0.1)
BASOS PCT: 1 %
EOS PCT: 4 %
Eosinophils Absolute: 0.3 10*3/uL (ref 0.0–0.7)
HCT: 39.9 % (ref 36.0–46.0)
Hemoglobin: 12.8 g/dL (ref 12.0–15.0)
LYMPHS PCT: 24 %
Lymphs Abs: 1.8 10*3/uL (ref 0.7–4.0)
MCH: 29.7 pg (ref 26.0–34.0)
MCHC: 32.1 g/dL (ref 30.0–36.0)
MCV: 92.6 fL (ref 78.0–100.0)
Monocytes Absolute: 0.8 10*3/uL (ref 0.1–1.0)
Monocytes Relative: 11 %
Neutro Abs: 4.6 10*3/uL (ref 1.7–7.7)
Neutrophils Relative %: 60 %
PLATELETS: 334 10*3/uL (ref 150–400)
RBC: 4.31 MIL/uL (ref 3.87–5.11)
RDW: 14.2 % (ref 11.5–15.5)
WBC: 7.6 10*3/uL (ref 4.0–10.5)

## 2015-06-05 LAB — APTT: APTT: 32 s (ref 24–37)

## 2015-06-05 LAB — PROTIME-INR
INR: 1.14 (ref 0.00–1.49)
PROTHROMBIN TIME: 14.8 s (ref 11.6–15.2)

## 2015-06-05 LAB — BRAIN NATRIURETIC PEPTIDE: B NATRIURETIC PEPTIDE 5: 195.1 pg/mL — AB (ref 0.0–100.0)

## 2015-06-05 LAB — TSH: TSH: 0.016 u[IU]/mL — ABNORMAL LOW (ref 0.350–4.500)

## 2015-06-05 MED ORDER — LEVOTHYROXINE SODIUM 100 MCG PO TABS
175.0000 ug | ORAL_TABLET | Freq: Every day | ORAL | Status: DC
  Administered 2015-06-06 – 2015-06-08 (×3): 175 ug via ORAL
  Filled 2015-06-05 (×6): qty 1

## 2015-06-05 MED ORDER — ONDANSETRON HCL 4 MG/2ML IJ SOLN: 4.0000 mg | Freq: Four times a day (QID) | INTRAMUSCULAR | Status: DC | PRN

## 2015-06-05 MED ORDER — HYDROCODONE-ACETAMINOPHEN 5-325 MG PO TABS
1.0000 | ORAL_TABLET | ORAL | Status: DC | PRN
Start: 2015-06-05 — End: 2015-06-08

## 2015-06-05 MED ORDER — ONDANSETRON HCL 4 MG PO TABS: 4.0000 mg | ORAL_TABLET | Freq: Four times a day (QID) | ORAL | Status: DC | PRN

## 2015-06-05 MED ORDER — HEPARIN BOLUS VIA INFUSION
4000.0000 [IU] | Freq: Once | INTRAVENOUS | Status: AC
  Administered 2015-06-05: 4000 [IU] via INTRAVENOUS
  Filled 2015-06-05: qty 4000

## 2015-06-05 MED ORDER — DILTIAZEM HCL 100 MG IV SOLR
5.0000 mg/h | INTRAVENOUS | Status: DC
  Administered 2015-06-05: 5 mg/h via INTRAVENOUS
  Administered 2015-06-06 (×2): 12.5 mg/h via INTRAVENOUS
  Filled 2015-06-05 (×4): qty 100

## 2015-06-05 MED ORDER — DILTIAZEM LOAD VIA INFUSION
20.0000 mg | Freq: Once | INTRAVENOUS | Status: AC
  Administered 2015-06-05: 20 mg via INTRAVENOUS
  Filled 2015-06-05: qty 20

## 2015-06-05 MED ORDER — HEPARIN (PORCINE) IN NACL 100-0.45 UNIT/ML-% IJ SOLN
1000.0000 [IU]/h | INTRAMUSCULAR | Status: DC
  Administered 2015-06-05: 1000 [IU]/h via INTRAVENOUS
  Filled 2015-06-05 (×2): qty 250

## 2015-06-05 MED ORDER — POLYETHYLENE GLYCOL 3350 17 G PO PACK
17.0000 g | PACK | Freq: Every day | ORAL | Status: DC | PRN
  Filled 2015-06-05: qty 1

## 2015-06-05 NOTE — ED Notes (Signed)
Per EMS- pt went to UC thinking she had bronchitis. Pt noted to be in new onset afib with RVR. Pt given 10mg  Cardizem by EMS. Hr now betweens 90-140s.

## 2015-06-05 NOTE — Progress Notes (Signed)
Patient ID: Katrina Velez, female    DOB: 08-Apr-1926, 82 y.o.   MRN: [id number]  PCP: Lucilla EdinAUB, STEVE A, MD  Subjective:   Chief Complaint  Patient presents with  . Shortness of Breath    HPI Presents for evaluation of SOB and a ongoing dry cough with a hx of COPD and Hypothyroidism. She is accompanied today by her daughter.   She was seen on 03/15/15, with a gradual onset of a dry cough for several weeks. She was dx with Acute Bronchitis and was given Hycodan. She reports the Hycodan did not make her feel well so she stopped taking it. Her current cough is non-productive and she feels like she needs to get something up but never can. She notes relief with an OTC cough syrup. She cannot nothing that aggrevates her cough. Her cough only occurs on occasion and lasts for less than 5 seconds.   She feels labored for breathing when gets up in the morning after going to the bathroom and returning to her bed. She says as the day goes on it becomes less evident. She denies DOE, orthopnea.   Wake up with a dry mouth. During one of her operations she was given albuterol, which is now expired. She wants to know if a new rx could help.   She is wearing a brace on her right hand for carpal tunnel.   Fell on her back 1 week ago. She feels like it is resolving  Lines on nails, keep breaking...    Review of Systems Constitutional: Positive for fatigue. Negative for fever.  HENT: Positive for rhinorrhea, sneezing and tinnitus (chronic. Managed with hearing aids). Negative for congestion.  Eyes: Negative for pain.  Respiratory: Positive for cough, chest tightness, shortness of breath and wheezing (whistling when breathing). Negative for choking.  Cardiovascular: Negative for palpitations and leg swelling.  Gastrointestinal: Negative for abdominal pain.  Musculoskeletal: Back pain: fell on week prior and resolving.  Hematological: Does not bruise/bleed easily.     Patient Active Problem List   Diagnosis Date Noted  . Nonspecific abnormal electrocardiogram (ECG) (EKG) 05/27/2014  . Closed left hip fracture (HCC) 02/23/2013  . Elevated glucose 03/26/2012  . Hypothyroidism 01/31/2012  . COPD (chronic obstructive pulmonary disease) (HCC) 01/31/2012  . Nicotine addiction 01/31/2012  . Inflammatory arthritis (HCC) 01/31/2012     Prior to Admission medications   Medication Sig Start Date End Date Taking? Authorizing Provider  ascorbic acid (VITAMIN C) 500 MG tablet Take 1,000 mg by mouth daily.   Yes Historical Provider, MD  aspirin 325 MG EC tablet Take 925 mg by mouth daily.   Yes Historical Provider, MD  levothyroxine (SYNTHROID) 175 MCG tablet TAKE 1 TABLET (175 MCG TOTAL) BY MOUTH DAILY  BEFORE BREAKFAST.  "OFFICE VISIT NEEDED FOR REFILLS" 05/07/15  Yes Collene GobbleSteven A Daub, MD  lidocaine (XYLOCAINE) 2 % solution Use as directed 20 mLs in the mouth or throat as needed for mouth pain. 03/15/15  Yes Elvina SidleKurt Lauenstein, MD  Multiple Vitamin (MULTIVITAMIN) capsule Take 1 capsule by mouth daily.   Yes Historical Provider, MD  vitamin E 400 UNIT capsule Take 400 Units by mouth daily.   Yes Historical Provider, MD  HYDROcodone-homatropine (HYCODAN) 5-1.5 MG/5ML syrup Take 5 mLs by mouth every 8 (eight) hours as needed for cough. Patient not taking: Reported on 06/05/2015 03/15/15   Elvina SidleKurt Lauenstein, MD     Allergies  Allergen Reactions  . Banana Nausea Only    All banana products also  .  Penicillins Hives       Objective:  Physical Exam  Constitutional: She is oriented to person, place, and time. Vital signs are normal. She appears well-developed and well-nourished. She is active and cooperative. No distress.  BP 120/82 mmHg  Pulse 151  Temp(Src) 97.4 F (36.3 C) (Oral)  Resp 18  Ht 5' 3.5" (1.613 m)  Wt 161 lb 3.2 oz (73.12 kg)  BMI 28.10 kg/m2  SpO2 98%  HENT:  Head: Normocephalic and atraumatic.  Right Ear: Hearing normal.  Left Ear: Hearing normal.  Eyes: Conjunctivae are  normal. No scleral icterus.  Neck: Normal range of motion. Neck supple. No thyromegaly present.  Cardiovascular: Normal heart sounds and intact distal pulses.  An irregular rhythm present.  No extrasystoles are present. Tachycardia present.   Pulses:      Radial pulses are 2+ on the right side, and 2+ on the left side.  Pulmonary/Chest: Effort normal and breath sounds normal.  Lymphadenopathy:       Head (right side): No tonsillar, no preauricular, no posterior auricular and no occipital adenopathy present.       Head (left side): No tonsillar, no preauricular, no posterior auricular and no occipital adenopathy present.    She has no cervical adenopathy.       Right: No supraclavicular adenopathy present.       Left: No supraclavicular adenopathy present.  Neurological: She is alert and oriented to person, place, and time. No sensory deficit.  Skin: Skin is warm, dry and intact. No rash noted. No cyanosis or erythema. Nails show no clubbing.  Psychiatric: She has a normal mood and affect. Her speech is normal and behavior is normal.   EKG reviewed with Dr. Clelia Croft. Irregularly irregular rhythm with rate in the 160's.        Assessment & Plan:   1. Cough Unclear etiology. Will need additional evaluation.  2. Tachycardia - EKG 12-Lead  3. Atrial fibrillation, unspecified type (HCC) New diagnosis. IV started KVO. Transport to the ED by EMS. Press photographer at [employer].   Fernande Bras, PA-C Physician Assistant-Certified [facility]

## 2015-06-05 NOTE — H&P (Signed)
CARDIOLOGY INPATIENT HISTORY AND PHYSICAL EXAMINATION NOTE  Patient ID: Katrina Velez MRN: [id number], DOB/AGE: Jan 01, 1926   Admit date: 06/05/2015   Primary Physician: Lucilla Edin, MD Primary Cardiologist: Charlton Haws MD  Reason for admission: Atrial fibrillation with RVR  HPI: This is a 82 y.o.white female with no history of CAD and but has history of carpel tunnel syndrome, OA, HTN, COPD.  Patient has been having the feeling that she has a cough but she could not cough. She was given a cough syrup in 02/2015.  This continued on and she continued to have tired and fatigued. She went to the urgent care and was found to be tachycardiac. She has been taking delsyn recently. She has been feeling SOB as well with these symptoms. She denied PND, orthopnea or leg swelling. She was sent from urgent care. EMS gave 10 mg of IV cardiazem. CXR showed that there might be some intersitital edema and cardiomegaly.    Problem List: Past Medical History  Diagnosis Date  . Arthritis   . Cataract   . Thyroid disease   . Murmur   . COPD (chronic obstructive pulmonary disease) [facility])     Past Surgical History  Procedure Laterality Date  . Eye surgery    . Hernia repair    . Knee surgery    . Wrist surgery      both wrists  . Right hip replacement       Allergies:  Allergies  Allergen Reactions  . Banana Nausea Only    All banana products also  . Penicillins Hives    Has patient had a PCN reaction causing immediate rash, facial/tongue/throat swelling, SOB or lightheadedness with hypotension: No Has patient had a PCN reaction causing severe rash involving mucus membranes or skin necrosis: No Has patient had a PCN reaction that required hospitalization No Has patient had a PCN reaction occurring within the last 10 years: No If all of the above answers are "NO", then may proceed with Cephalosporin use.      Home Medications Current Facility-Administered Medications  Medication Dose  Route Frequency Provider Last Rate Last Dose  . diltiazem (CARDIZEM) 100 mg in dextrose 5 % 100 mL (1 mg/mL) infusion  5-15 mg/hr Intravenous Continuous Linwood Dibbles, MD 5 mL/hr at 06/05/15 1908 5 mg/hr at 06/05/15 1908   Current Outpatient Prescriptions  Medication Sig Dispense Refill  . ascorbic acid (VITAMIN C) 500 MG tablet Take 1,000 mg by mouth daily.    Marland Kitchen aspirin 325 MG EC tablet Take 925 mg by mouth 2 (two) times daily as needed for pain.     . Diphenhydramine-PE-APAP (DELSYM COUGH/COLD NIGHT TIME PO) Take 10 mLs by mouth 2 (two) times daily as needed (for cough).    Marland Kitchen levothyroxine (SYNTHROID) 175 MCG tablet TAKE 1 TABLET (175 MCG TOTAL) BY MOUTH DAILY  BEFORE BREAKFAST.  "OFFICE VISIT NEEDED FOR REFILLS" 30 tablet 0  . lidocaine (XYLOCAINE) 2 % solution Use as directed 20 mLs in the mouth or throat as needed for mouth pain. (Patient taking differently: Use as directed 20 mLs in the mouth or throat as needed (for hand pain). ) 100 mL 0  . Multiple Vitamin (MULTIVITAMIN) capsule Take 1 capsule by mouth daily.    . vitamin E 400 UNIT capsule Take 400 Units by mouth daily.    Marland Kitchen HYDROcodone-homatropine (HYCODAN) 5-1.5 MG/5ML syrup Take 5 mLs by mouth every 8 (eight) hours as needed for cough. (Patient not taking: Reported on 06/05/2015) 120 mL  0     No family history on file.   Social History   Social History  . Marital Status: Widowed    Spouse Name: N/A  . Number of Children: N/A  . Years of Education: N/A   Occupational History  . Not on file.   Social History Main Topics  . Smoking status: Former Smoker    Types: E-cigarettes    Quit date: 05/28/2011  . Smokeless tobacco: Never Used  . Alcohol Use: 0.0 oz/week    0 Standard drinks or equivalent per week  . Drug Use: No  . Sexual Activity: Not on file   Other Topics Concern  . Not on file   Social History Narrative     Review of Systems: General: negative for chills, fever, night sweats or weight changes.    Cardiovascular: chest pain, dyspnea negative for dyspnea on exertion, edema, orthopnea, palpitations, paroxysmal nocturnal dyspnea  Dermatological: negative for rash Respiratory: positive for cough or wheezing Urologic: negative for hematuria Abdominal: negative for nausea, vomiting, diarrhea, bright red blood per rectum, melena, or hematemesis Neurologic: negative for visual changes, syncope, or dizziness Endocrine: no diabetes, no hypothyroidism Immunological: no lymph adenopathy Psych: non homicidal/suicidal  Physical Exam: Vitals: BP 140/90 mmHg  Pulse 148  Temp(Src) 98 F (36.7 C)  Resp 28  SpO2 96% General: not in acute distress Neck: JVP flat, neck supple Heart: irregular rate and rhythm, S1, S2, no murmurs  Lungs: wheezing present, no crackles GI: non tender, non distended, bowel sounds present Extremities: no edema Neuro: AAO x 3  Psych: normal affect, no anxiety   Labs:   Results for orders placed or performed during the hospital encounter of 06/05/15 (from the past 24 hour(s))  Basic metabolic panel     Status: Abnormal   Collection Time: 06/05/15  6:00 PM  Result Value Ref Range   Sodium 140 135 - 145 mmol/L   Potassium 4.7 3.5 - 5.1 mmol/L   Chloride 107 101 - 111 mmol/L   CO2 25 22 - 32 mmol/L   Glucose, Bld 103 (H) 65 - 99 mg/dL   BUN 13 6 - 20 mg/dL   Creatinine, Ser 4.090.78 0.44 - 1.00 mg/dL   Calcium 9.4 8.9 - 81.110.3 mg/dL   GFR calc non Af Amer >60 >60 mL/min   GFR calc Af Amer >60 >60 mL/min   Anion gap 8 5 - 15  CBC with Differential     Status: None   Collection Time: 06/05/15  6:00 PM  Result Value Ref Range   WBC 7.6 4.0 - 10.5 K/uL   RBC 4.31 3.87 - 5.11 MIL/uL   Hemoglobin 12.8 12.0 - 15.0 g/dL   HCT 91.439.9 78.236.0 - 95.646.0 %   MCV 92.6 78.0 - 100.0 fL   MCH 29.7 26.0 - 34.0 pg   MCHC 32.1 30.0 - 36.0 g/dL   RDW 21.314.2 08.611.5 - 57.815.5 %   Platelets 334 150 - 400 K/uL   Neutrophils Relative % 60 %   Neutro Abs 4.6 1.7 - 7.7 K/uL   Lymphocytes  Relative 24 %   Lymphs Abs 1.8 0.7 - 4.0 K/uL   Monocytes Relative 11 %   Monocytes Absolute 0.8 0.1 - 1.0 K/uL   Eosinophils Relative 4 %   Eosinophils Absolute 0.3 0.0 - 0.7 K/uL   Basophils Relative 1 %   Basophils Absolute 0.0 0.0 - 0.1 K/uL  Protime-INR (if pt is taking coumadin)     Status: None  Collection Time: 06/05/15  6:00 PM  Result Value Ref Range   Prothrombin Time 14.8 11.6 - 15.2 seconds   INR 1.14 0.00 - 1.49  APTT  (IF patient is taking Pradaxa)     Status: None   Collection Time: 06/05/15  6:00 PM  Result Value Ref Range   aPTT 32 24 - 37 seconds  Magnesium     Status: None   Collection Time: 06/05/15  6:00 PM  Result Value Ref Range   Magnesium 2.1 1.7 - 2.4 mg/dL  Brain natriuretic peptide     Status: Abnormal   Collection Time: 06/05/15  6:00 PM  Result Value Ref Range   B Natriuretic Peptide 195.1 (H) 0.0 - 100.0 pg/mL  I-stat troponin, ED (not at [facility])     Status: None   Collection Time: 06/05/15  6:08 PM  Result Value Ref Range   Troponin i, poc 0.01 0.00 - 0.08 ng/mL   Comment 3             Radiology/Studies: Dg Chest Port 1 View  06/05/2015  CLINICAL DATA:  Atrial fibrillation.  COPD.  Former smoker. EXAM: PORTABLE CHEST 1 VIEW COMPARISON:  05/27/2014 FINDINGS: Mild cardiomegaly noted. Increased diffuse interstitial infiltrates are suspicious for mild interstitial edema. No evidence of pulmonary consolidation or pleural effusion. Bilateral shoulder prostheses again noted. IMPRESSION: Mild cardiomegaly and increased diffuse interstitial infiltrates, suspicious for mild edema. Electronically Signed   By: Myles Rosenthal M.D.   On: 06/05/2015 18:00    EKG: atrial fibrillation with RVR  Echo: pending  Cardiac cath: none  Medical decision making:  Discussed care with the patient, daughter and family  Discussed care with the ED physician on the phone Reviewed labs and imaging personally Reviewed prior records  ASSESSMENT AND PLAN:  This is a 82  y.o. female with white female with no history of CAD and but has history of carpel tunnel syndrome, OA, HTN, COPD who presented with fatigue and SOB secondary to new onset atrial fibrillation with RVR and mild CHF. Ongoing symptoms since 02/2015.    Principal Problem:   Paroxysmal atrial fibrillation with RVR (HCC) Active Problems:   Hypothyroidism   COPD (chronic obstructive pulmonary disease) (HCC)   Acute CHF (congestive heart failure) (HCC)  Acute on chronic CHF - NYHA class III Secondary atrial fibrillation Pulmonary edema on CXR, elevated BNP Diltiazem drip to control rate Consider rhythm control as outpatient TSH ordered Consider decreasing dose of levothyroxine Keep potassium >4, calcium >9, magnesium >2 Echocardiogram ordered Strict I/Os, daily weights, low sodium diet  Paroxysmal new onset atrial fibrillation with RVR, likely non valvular atrial fibrillation, rate controlled, not rhythm controlled likely trigger is mild heart failure and levothyroxine No Prior history of CHF, hypertension, age >25, and vascular disease (CHADS2VASC = 4, CHADS2 = 3) Maintained on IV heparin for anticoagulation, LA size is not available Rate control started by using diltiazem drip for now  Checking TSH, electrolytes Keep potassium >4, calcium >9, magnesium >2 Echocardiogram to evaluate for LVEF/tachycardia induced cardiomyoapthy Discussed with her NOAC vs. Warfarin. She will prefer warfarin but is still deciding about it.   Mild wheezing/COPD Nebs prn   Hypothyroidsm Checking TSH if levothyroxine needed to be changed  Acute pulmonary edema IV diuresis, echo in AM  Signed, Joellyn Rued, MD MS 06/05/2015, 7:15 PM

## 2015-06-05 NOTE — Progress Notes (Signed)
Subjective:    Patient ID: Katrina Velez, female    DOB: Jun 03, 1925, 82 y.o.   MRN: [id number]  Chief Complaint  Patient presents with  . Shortness of Breath   HPI Patient presents today with SOB and a ongoing dry cough with a hx of COPD and Hypothyroidism. She was seen on 03/15/15, with a gradual onset of a dry cough for several weeks. She was dx with Acute Bronchitis and was given Hycodan. She reports the Hycodan did not make her feel well so she stopped taking it. Her current cough is non-productive and she feels like she needs to get something up but never can. She notes relief with an OTC cough syrup. She cannot nothing that aggrevates her cough. Her cough only occurs on occasion and lasts for less than 5 seconds. She admits to rhinorrhea, sneezing, chest tightness, wheezing and SOB.  Her SOB is most noticeable when gets up in the morning after going to the bathroom and returning to her bed. She says as the day goes on it becomes less evident. She admits to fatigue. She denies DOE, orthopnea.   Patient reports she fell last week on her back. Is starting to feel better but back is still sore.   Review of Systems  Constitutional: Positive for fatigue. Negative for fever.  HENT: Positive for rhinorrhea, sneezing and tinnitus (chronic. Managed with hearing aids). Negative for congestion.   Eyes: Negative for pain.  Respiratory: Positive for cough, chest tightness, shortness of breath and wheezing (whistling when breathing). Negative for choking.   Cardiovascular: Negative for palpitations and leg swelling.  Gastrointestinal: Negative for abdominal pain.  Musculoskeletal: Back pain: fell on week prior and resolving.  Hematological: Does not bruise/bleed easily.   Allergies  Allergen Reactions  . Banana Nausea Only    All banana products also  . Penicillins Hives   Prior to Admission medications   Medication Sig Start Date End Date Taking? Authorizing Provider  ascorbic acid (VITAMIN  C) 500 MG tablet Take 1,000 mg by mouth daily.   Yes Historical Provider, MD  aspirin 325 MG EC tablet Take 925 mg by mouth daily.   Yes Historical Provider, MD  levothyroxine (SYNTHROID) 175 MCG tablet TAKE 1 TABLET (175 MCG TOTAL) BY MOUTH DAILY  BEFORE BREAKFAST.  "OFFICE VISIT NEEDED FOR REFILLS" 05/07/15  Yes Collene GobbleSteven A Daub, MD  lidocaine (XYLOCAINE) 2 % solution Use as directed 20 mLs in the mouth or throat as needed for mouth pain. 03/15/15  Yes Elvina SidleKurt Lauenstein, MD  Multiple Vitamin (MULTIVITAMIN) capsule Take 1 capsule by mouth daily.   Yes Historical Provider, MD  vitamin E 400 UNIT capsule Take 400 Units by mouth daily.   Yes Historical Provider, MD  HYDROcodone-homatropine (HYCODAN) 5-1.5 MG/5ML syrup Take 5 mLs by mouth every 8 (eight) hours as needed for cough. Patient not taking: Reported on 06/05/2015 03/15/15   Elvina SidleKurt Lauenstein, MD   Patient Active Problem List   Diagnosis Date Noted  . Nonspecific abnormal electrocardiogram (ECG) (EKG) 05/27/2014  . Closed left hip fracture (HCC) 02/23/2013  . Elevated glucose 03/26/2012  . Hypothyroidism 01/31/2012  . COPD (chronic obstructive pulmonary disease) (HCC) 01/31/2012  . Nicotine addiction 01/31/2012  . Inflammatory arthritis (HCC) 01/31/2012       Objective:   Physical Exam  Constitutional: Vital signs are normal. She appears well-developed and well-nourished. No distress.  BP 120/82 mmHg  Pulse 151  Temp(Src) 97.4 F (36.3 C) (Oral)  Resp 18  Ht 5' 3.5" (1.613  m)  Wt 161 lb 3.2 oz (73.12 kg)  BMI 28.10 kg/m2  SpO2 98%  HENT:  Head: Normocephalic and atraumatic.  Right Ear: External ear normal.  Left Ear: Hearing and external ear normal.  Mouth/Throat: Uvula is midline, oropharynx is clear and moist and mucous membranes are normal.  Eyes: Conjunctivae are normal. Pupils are equal, round, and reactive to light.  Neck: Trachea normal. Neck supple.  Cardiovascular: Tachycardia present.  Exam reveals no gallop and no  friction rub.   No murmur heard. Pulmonary/Chest: Effort normal and breath sounds normal. No respiratory distress. She exhibits no tenderness.  Lymphadenopathy:       Head (right side): No submental, no submandibular, no tonsillar, no preauricular, no posterior auricular and no occipital adenopathy present.       Head (left side): No submental, no submandibular, no tonsillar, no preauricular, no posterior auricular and no occipital adenopathy present.    She has no cervical adenopathy.  Neurological: She is alert.  Skin: Skin is warm and dry. No rash noted.  Psychiatric: She has a normal mood and affect. Her behavior is normal.    EKG reviewed with Dr. Clelia Croft.  Irregularly irregular rhythm with rate in the 160's.     Assessment & Plan:  1. Cough 2. Tachycardia - EKG 12-Lead 3. Atrial fibrillation, unspecified type (HCC) -Transport to [facility] ED via EMS.

## 2015-06-05 NOTE — ED Provider Notes (Signed)
CSN: [id number]     Arrival date & time 06/05/15  1715 History   First MD Initiated Contact with Patient 06/05/15 1730     Chief Complaint  Patient presents with  . Atrial Fibrillation    HPI Patient presents to the emergency room for evaluation of atrial fibrillation. She has been having trouble with cough and congestion for the last few weeks. Patient has felt intermittently short of breath. She has not noticed any issues with her heart racing and has not had any trouble with chest pain. She has tried some over-the-counter medications but they have not helped. She went into an urgent care today to be evaluated for this congestion. They noted at that facility that she had a very rapid irregular heart rhythm. Patient was then sent to the emergency room for further evaluation. This noted that she was in A. fib and they gave her a dose of Cardizem during transport. Patient continues to not have any difficulty with any chest pain. She's not having any difficulty with her breathing. She never has had atrial fibrillation in the past. She has no known heart disease. She denies any issues with stroke, or bleeding issues in the past. Past Medical History  Diagnosis Date  . Arthritis   . Cataract   . Thyroid disease   . Murmur   . COPD (chronic obstructive pulmonary disease) [facility](HCC)    Past Surgical History  Procedure Laterality Date  . Eye surgery    . Hernia repair    . Knee surgery    . Wrist surgery      both wrists  . Right hip replacement     No family history on file. Social History  Substance Use Topics  . Smoking status: Former Smoker    Types: E-cigarettes    Quit date: 05/28/2011  . Smokeless tobacco: Never Used  . Alcohol Use: 0.0 oz/week    0 Standard drinks or equivalent per week   OB History    No data available     Review of Systems  All other systems reviewed and are negative.     Allergies  Banana and Penicillins  Home Medications   Prior to Admission  medications   Medication Sig Start Date End Date Taking? Authorizing Provider  ascorbic acid (VITAMIN C) 500 MG tablet Take 1,000 mg by mouth daily.   Yes Historical Provider, MD  aspirin 325 MG EC tablet Take 925 mg by mouth 2 (two) times daily as needed for pain.    Yes Historical Provider, MD  Diphenhydramine-PE-APAP (DELSYM COUGH/COLD NIGHT TIME PO) Take 10 mLs by mouth 2 (two) times daily as needed (for cough).   Yes Historical Provider, MD  levothyroxine (SYNTHROID) 175 MCG tablet TAKE 1 TABLET (175 MCG TOTAL) BY MOUTH DAILY  BEFORE BREAKFAST.  "OFFICE VISIT NEEDED FOR REFILLS" 05/07/15  Yes Collene GobbleSteven A Daub, MD  lidocaine (XYLOCAINE) 2 % solution Use as directed 20 mLs in the mouth or throat as needed for mouth pain. Patient taking differently: Use as directed 20 mLs in the mouth or throat as needed (for hand pain).  03/15/15  Yes Elvina SidleKurt Lauenstein, MD  Multiple Vitamin (MULTIVITAMIN) capsule Take 1 capsule by mouth daily.   Yes Historical Provider, MD  vitamin E 400 UNIT capsule Take 400 Units by mouth daily.   Yes Historical Provider, MD  HYDROcodone-homatropine (HYCODAN) 5-1.5 MG/5ML syrup Take 5 mLs by mouth every 8 (eight) hours as needed for cough. Patient not taking: Reported on 06/05/2015 03/15/15  Elvina Sidle, MD   BP 140/90 mmHg  Pulse 148  Temp(Src) 98 F (36.7 C)  Resp 28  SpO2 96% Physical Exam  Constitutional: She appears well-developed and well-nourished. No distress.  HENT:  Head: Normocephalic and atraumatic.  Right Ear: External ear normal.  Left Ear: External ear normal.  Eyes: Conjunctivae are normal. Right eye exhibits no discharge. Left eye exhibits no discharge. No scleral icterus.  Neck: Neck supple. No tracheal deviation present.  Cardiovascular: Intact distal pulses.  An irregularly irregular rhythm present. Tachycardia present.   Pulmonary/Chest: Effort normal and breath sounds normal. No stridor. No respiratory distress. She has no wheezes. She has no  rales.  Abdominal: Soft. Bowel sounds are normal. She exhibits no distension. There is no tenderness. There is no rebound and no guarding.  Musculoskeletal: She exhibits no edema or tenderness.  Neurological: She is alert. She has normal strength. No cranial nerve deficit (no facial droop, extraocular movements intact, no slurred speech) or sensory deficit. She exhibits normal muscle tone. She displays no seizure activity. Coordination normal.  Skin: Skin is warm and dry. No rash noted.  Psychiatric: She has a normal mood and affect.  Nursing note and vitals reviewed.   ED Course  Procedures (including critical care time) Labs Review Labs Reviewed  BASIC METABOLIC PANEL - Abnormal; Notable for the following:    Glucose, Bld 103 (*)    All other components within normal limits  BRAIN NATRIURETIC PEPTIDE - Abnormal; Notable for the following:    B Natriuretic Peptide 195.1 (*)    All other components within normal limits  CBC WITH DIFFERENTIAL/PLATELET  PROTIME-INR  APTT  MAGNESIUM  I-STAT TROPOININ, ED    Imaging Review Dg Chest Port 1 View  06/05/2015  CLINICAL DATA:  Atrial fibrillation.  COPD.  Former smoker. EXAM: PORTABLE CHEST 1 VIEW COMPARISON:  05/27/2014 FINDINGS: Mild cardiomegaly noted. Increased diffuse interstitial infiltrates are suspicious for mild interstitial edema. No evidence of pulmonary consolidation or pleural effusion. Bilateral shoulder prostheses again noted. IMPRESSION: Mild cardiomegaly and increased diffuse interstitial infiltrates, suspicious for mild edema. Electronically Signed   By: Myles Rosenthal M.D.   On: 06/05/2015 18:00   I have personally reviewed and evaluated these images and lab results as part of my medical decision-making.   EKG Interpretation   Date/Time:  Saturday June 05 2015 17:27:17 EST Ventricular Rate:  154 PR Interval:    QRS Duration: 84 QT Interval:  317 QTC Calculation: 507 R Axis:   67 Text Interpretation:  Atrial  fibrillation with rapid V-rate Low voltage,  extremity and precordial leads Confirmed by Mairim Bade  MD-J, Talullah Abate ([id number]) on  06/05/2015 5:53:45 PM     Medications  diltiazem (CARDIZEM) 1 mg/mL load via infusion 20 mg (not administered)    And  diltiazem (CARDIZEM) 100 mg in dextrose 5 % 100 mL (1 mg/mL) infusion (not administered)    MDM   Final diagnoses:  Atrial fibrillation with RVR (HCC)    Pt presents with new onset a fib.  When this actually started is unknown.  Pt has been asymptomatic with the exception of coughing and some dyspnea that she attributed to URI sx. Chads2score is 1.  Cha2ds2vasc score is 3.  Cardizem infusion was ordered after initial evaluation.  Pt has remained in a fib.  Will consult with cardiology    Linwood Dibbles, MD 06/05/15 [phone]

## 2015-06-05 NOTE — Progress Notes (Signed)
ANTICOAGULATION CONSULT NOTE - Initial Consult  Pharmacy Consult for Heparin Indication: atrial fibrillation  Patient Measurements:   Heparin Dosing Weight: 69 kg  Vital Signs: Temp: 98 F (36.7 C) (01/14 1729) Temp Source: Oral (01/14 1454) BP: 123/84 mmHg (01/14 2015) Pulse Rate: 102 (01/14 1945)  Labs:  Recent Labs  06/05/15 1800  HGB 12.8  HCT 39.9  PLT 334  APTT 32  LABPROT 14.8  INR 1.14  CREATININE 0.78    Estimated Creatinine Clearance: 48 mL/min (by C-G formula based on Cr of 0.78).   Medical History: Past Medical History  Diagnosis Date  . Arthritis   . Cataract   . Thyroid disease   . Murmur   . COPD (chronic obstructive pulmonary disease) (HCC)     Medications:   (Not in a hospital admission) Scheduled:  . [START ON 06/06/2015] levothyroxine  175 mcg Oral QAC breakfast   Infusions:  . diltiazem (CARDIZEM) infusion 10 mg/hr (06/05/15 2000)    Assessment: 82yo female with history of COPD and thyroid dz presents with atrial fibrillation. Pharmacy is consulted to dose heparin for Afib. CBC wnl, Trop neg, sCr 0.78.  Goal of Therapy:  Heparin level 0.3-0.7 units/ml Monitor platelets by anticoagulation protocol: Yes   Plan:  Give 4000 units bolus x 1 Start heparin infusion at 1000 units/hr Check anti-Xa level in 8 hours and daily while on heparin Continue to monitor H&H and platelets  Arlean Hoppingorey M. Newman PiesBall, PharmD, BCPS Clinical Pharmacist Pager [phone] 06/05/2015,8:36 PM

## 2015-06-05 NOTE — ED Notes (Signed)
Lab aware of BNP add on. Have not received tubes from the tube station yet.

## 2015-06-06 ENCOUNTER — Inpatient Hospital Stay (HOSPITAL_COMMUNITY): Payer: Medicare Other

## 2015-06-06 ENCOUNTER — Encounter (HOSPITAL_COMMUNITY): Payer: Self-pay | Admitting: *Deleted

## 2015-06-06 DIAGNOSIS — I509 Heart failure, unspecified: Secondary | ICD-10-CM | POA: Diagnosis not present

## 2015-06-06 DIAGNOSIS — I4891 Unspecified atrial fibrillation: Secondary | ICD-10-CM

## 2015-06-06 DIAGNOSIS — I480 Paroxysmal atrial fibrillation: Secondary | ICD-10-CM | POA: Diagnosis not present

## 2015-06-06 DIAGNOSIS — J438 Other emphysema: Secondary | ICD-10-CM | POA: Diagnosis not present

## 2015-06-06 DIAGNOSIS — I110 Hypertensive heart disease with heart failure: Secondary | ICD-10-CM | POA: Diagnosis present

## 2015-06-06 DIAGNOSIS — I340 Nonrheumatic mitral (valve) insufficiency: Secondary | ICD-10-CM | POA: Diagnosis not present

## 2015-06-06 DIAGNOSIS — Z880 Allergy status to penicillin: Secondary | ICD-10-CM | POA: Diagnosis not present

## 2015-06-06 DIAGNOSIS — Z87891 Personal history of nicotine dependence: Secondary | ICD-10-CM | POA: Diagnosis not present

## 2015-06-06 DIAGNOSIS — Z91018 Allergy to other foods: Secondary | ICD-10-CM | POA: Diagnosis not present

## 2015-06-06 DIAGNOSIS — I5041 Acute combined systolic (congestive) and diastolic (congestive) heart failure: Secondary | ICD-10-CM | POA: Diagnosis not present

## 2015-06-06 DIAGNOSIS — M1990 Unspecified osteoarthritis, unspecified site: Secondary | ICD-10-CM | POA: Diagnosis present

## 2015-06-06 DIAGNOSIS — Z96641 Presence of right artificial hip joint: Secondary | ICD-10-CM | POA: Diagnosis present

## 2015-06-06 DIAGNOSIS — Z7982 Long term (current) use of aspirin: Secondary | ICD-10-CM | POA: Diagnosis not present

## 2015-06-06 DIAGNOSIS — Z79899 Other long term (current) drug therapy: Secondary | ICD-10-CM | POA: Diagnosis not present

## 2015-06-06 DIAGNOSIS — J810 Acute pulmonary edema: Secondary | ICD-10-CM | POA: Diagnosis not present

## 2015-06-06 DIAGNOSIS — E039 Hypothyroidism, unspecified: Secondary | ICD-10-CM | POA: Diagnosis present

## 2015-06-06 DIAGNOSIS — J449 Chronic obstructive pulmonary disease, unspecified: Secondary | ICD-10-CM | POA: Diagnosis present

## 2015-06-06 DIAGNOSIS — E079 Disorder of thyroid, unspecified: Secondary | ICD-10-CM | POA: Diagnosis present

## 2015-06-06 LAB — CBC WITH DIFFERENTIAL/PLATELET
BASOS PCT: 1 %
Basophils Absolute: 0.1 10*3/uL (ref 0.0–0.1)
Eosinophils Absolute: 0.4 10*3/uL (ref 0.0–0.7)
Eosinophils Relative: 5 %
HEMATOCRIT: 35.2 % — AB (ref 36.0–46.0)
HEMOGLOBIN: 11.4 g/dL — AB (ref 12.0–15.0)
LYMPHS PCT: 22 %
Lymphs Abs: 1.7 10*3/uL (ref 0.7–4.0)
MCH: 29.8 pg (ref 26.0–34.0)
MCHC: 32.4 g/dL (ref 30.0–36.0)
MCV: 92.1 fL (ref 78.0–100.0)
MONO ABS: 0.9 10*3/uL (ref 0.1–1.0)
Monocytes Relative: 12 %
NEUTROS ABS: 4.7 10*3/uL (ref 1.7–7.7)
Neutrophils Relative %: 60 %
Platelets: 300 10*3/uL (ref 150–400)
RBC: 3.82 MIL/uL — AB (ref 3.87–5.11)
RDW: 14.2 % (ref 11.5–15.5)
WBC: 7.8 10*3/uL (ref 4.0–10.5)

## 2015-06-06 LAB — BASIC METABOLIC PANEL
ANION GAP: 9 (ref 5–15)
BUN: 15 mg/dL (ref 6–20)
CALCIUM: 9 mg/dL (ref 8.9–10.3)
CO2: 24 mmol/L (ref 22–32)
Chloride: 107 mmol/L (ref 101–111)
Creatinine, Ser: 0.8 mg/dL (ref 0.44–1.00)
GLUCOSE: 103 mg/dL — AB (ref 65–99)
POTASSIUM: 4 mmol/L (ref 3.5–5.1)
SODIUM: 140 mmol/L (ref 135–145)

## 2015-06-06 LAB — HEPARIN LEVEL (UNFRACTIONATED): HEPARIN UNFRACTIONATED: 0.53 [IU]/mL (ref 0.30–0.70)

## 2015-06-06 MED ORDER — DILTIAZEM HCL ER COATED BEADS 120 MG PO TB24
120.0000 mg | ORAL_TABLET | Freq: Every day | ORAL | Status: DC
  Administered 2015-06-06: 120 mg via ORAL
  Filled 2015-06-06 (×3): qty 1

## 2015-06-06 MED ORDER — FUROSEMIDE 10 MG/ML IJ SOLN
40.0000 mg | Freq: Four times a day (QID) | INTRAMUSCULAR | Status: AC
  Administered 2015-06-06 (×2): 40 mg via INTRAVENOUS
  Filled 2015-06-06 (×2): qty 4

## 2015-06-06 MED ORDER — APIXABAN 5 MG PO TABS
5.0000 mg | ORAL_TABLET | Freq: Two times a day (BID) | ORAL | Status: DC
  Administered 2015-06-06 – 2015-06-08 (×5): 5 mg via ORAL
  Filled 2015-06-06 (×6): qty 1

## 2015-06-06 MED ORDER — DILTIAZEM HCL 100 MG IV SOLR: 5.0000 mg/h | INTRAVENOUS | Status: DC

## 2015-06-06 NOTE — Progress Notes (Signed)
ANTICOAGULATION CONSULT NOTE - Follow Up Consult  Pharmacy Consult for heparin Indication: atrial fibrillation   Labs:  Recent Labs  06/05/15 1800 06/06/15 0347 06/06/15 0348  HGB 12.8 11.4*  --   HCT 39.9 35.2*  --   PLT 334 300  --   APTT 32  --   --   LABPROT 14.8  --   --   INR 1.14  --   --   HEPARINUNFRC  --   --  0.53  CREATININE 0.78  --   --      Assessment/Plan:  82yo female therapeutic on heparin with initial dosing for Afib. Will continue gtt at current rate and confirm stable with additional level.   Vernard GamblesVeronda Elford Evilsizer, PharmD, BCPS  06/06/2015,5:34 AM

## 2015-06-06 NOTE — Progress Notes (Signed)
  Echocardiogram 2D Echocardiogram has been performed.  Katrina Velez, Katrina Velez 06/06/2015, 5:46 PM

## 2015-06-06 NOTE — Progress Notes (Addendum)
DAILY PROGRESS NOTE  Subjective:  No events overnight. Feels better today. HR is controlled on diltiazem gtts. She is on IV heparin. BNP mildly elevated at 195.  Objective:  Temp:  [97.4 F (36.3 C)-98 F (36.7 C)] 97.9 F (36.6 C) (01/15 1001) Pulse Rate:  [55-157] 72 (01/15 1001) Resp:  [16-28] 20 (01/15 1001) BP: (93-143)/(41-98) 124/84 mmHg (01/15 1001) SpO2:  [91 %-98 %] 96 % (01/15 1001) Weight:  [159 lb 11.2 oz (72.439 kg)-161 lb 3.2 oz (73.12 kg)] 159 lb 11.2 oz (72.439 kg) (01/15 1001) Weight change:   Intake/Output from previous day:    Intake/Output from this shift:    Medications: Current Facility-Administered Medications  Medication Dose Route Frequency Provider Last Rate Last Dose  . diltiazem (CARDIZEM) 100 mg in dextrose 5 % 100 mL (1 mg/mL) infusion  5-20 mg/hr Intravenous Titrated Flossie Dibble, MD   0 mg/hr at 06/06/15 1007  . furosemide (LASIX) injection 40 mg  40 mg Intravenous Q6H Flossie Dibble, MD      . heparin ADULT infusion 100 units/mL (25000 units/250 mL)  1,000 Units/hr Intravenous Continuous Rebecka Apley, RPH 10 mL/hr at 06/05/15 2121 1,000 Units/hr at 06/05/15 2121  . HYDROcodone-acetaminophen (NORCO/VICODIN) 5-325 MG per tablet 1-2 tablet  1-2 tablet Oral Q4H PRN Flossie Dibble, MD      . levothyroxine (SYNTHROID, LEVOTHROID) tablet 175 mcg  175 mcg Oral QAC breakfast Flossie Dibble, MD   175 mcg at 06/06/15 0843  . ondansetron (ZOFRAN) tablet 4 mg  4 mg Oral Q6H PRN Flossie Dibble, MD       Or  . ondansetron (ZOFRAN) injection 4 mg  4 mg Intravenous Q6H PRN Flossie Dibble, MD      . polyethylene glycol (MIRALAX / GLYCOLAX) packet 17 g  17 g Oral Daily PRN Flossie Dibble, MD        Physical Exam: General appearance: alert and no distress Neck: JVD - 2 cm above sternal notch and no carotid bruit Lungs: rales bibasilar Heart: irregularly irregular rhythm Abdomen: soft, non-tender; bowel sounds normal; no masses,  no  organomegaly Extremities: extremities normal, atraumatic, no cyanosis or edema Pulses: 2+ and symmetric Skin: Skin color, texture, turgor normal. No rashes or lesions Neurologic: Grossly normal Psych: Pleasant  Lab Results: Results for orders placed or performed during the hospital encounter of 06/05/15 (from the past 48 hour(s))  Basic metabolic panel     Status: Abnormal   Collection Time: 06/05/15  6:00 PM  Result Value Ref Range   Sodium 140 135 - 145 mmol/L   Potassium 4.7 3.5 - 5.1 mmol/L   Chloride 107 101 - 111 mmol/L   CO2 25 22 - 32 mmol/L   Glucose, Bld 103 (H) 65 - 99 mg/dL   BUN 13 6 - 20 mg/dL   Creatinine, Ser 0.78 0.44 - 1.00 mg/dL   Calcium 9.4 8.9 - 10.3 mg/dL   GFR calc non Af Amer >60 >60 mL/min   GFR calc Af Amer >60 >60 mL/min    Comment: (NOTE) The eGFR has been calculated using the CKD EPI equation. This calculation has not been validated in all clinical situations. eGFR's persistently <60 mL/min signify possible Chronic Kidney Disease.    Anion gap 8 5 - 15  CBC with Differential     Status: None   Collection Time: 06/05/15  6:00 PM  Result Value Ref Range   WBC 7.6 4.0 - 10.5 K/uL  RBC 4.31 3.87 - 5.11 MIL/uL   Hemoglobin 12.8 12.0 - 15.0 g/dL   HCT 39.9 36.0 - 46.0 %   MCV 92.6 78.0 - 100.0 fL   MCH 29.7 26.0 - 34.0 pg   MCHC 32.1 30.0 - 36.0 g/dL   RDW 14.2 11.5 - 15.5 %   Platelets 334 150 - 400 K/uL   Neutrophils Relative % 60 %   Neutro Abs 4.6 1.7 - 7.7 K/uL   Lymphocytes Relative 24 %   Lymphs Abs 1.8 0.7 - 4.0 K/uL   Monocytes Relative 11 %   Monocytes Absolute 0.8 0.1 - 1.0 K/uL   Eosinophils Relative 4 %   Eosinophils Absolute 0.3 0.0 - 0.7 K/uL   Basophils Relative 1 %   Basophils Absolute 0.0 0.0 - 0.1 K/uL  Protime-INR (if pt is taking coumadin)     Status: None   Collection Time: 06/05/15  6:00 PM  Result Value Ref Range   Prothrombin Time 14.8 11.6 - 15.2 seconds   INR 1.14 0.00 - 1.49  APTT  (IF patient is taking  Pradaxa)     Status: None   Collection Time: 06/05/15  6:00 PM  Result Value Ref Range   aPTT 32 24 - 37 seconds  Magnesium     Status: None   Collection Time: 06/05/15  6:00 PM  Result Value Ref Range   Magnesium 2.1 1.7 - 2.4 mg/dL  Brain natriuretic peptide     Status: Abnormal   Collection Time: 06/05/15  6:00 PM  Result Value Ref Range   B Natriuretic Peptide 195.1 (H) 0.0 - 100.0 pg/mL  I-stat troponin, ED (not at [facility], [facility])     Status: None   Collection Time: 06/05/15  6:08 PM  Result Value Ref Range   Troponin i, poc 0.01 0.00 - 0.08 ng/mL   Comment 3            Comment: Due to the release kinetics of cTnI, a negative result within the first hours of the onset of symptoms does not rule out myocardial infarction with certainty. If myocardial infarction is still suspected, repeat the test at appropriate intervals.   TSH     Status: Abnormal   Collection Time: 06/05/15  7:46 PM  Result Value Ref Range   TSH 0.016 (L) 0.350 - 4.500 uIU/mL  Basic metabolic panel     Status: Abnormal   Collection Time: 06/06/15  3:47 AM  Result Value Ref Range   Sodium 140 135 - 145 mmol/L   Potassium 4.0 3.5 - 5.1 mmol/L   Chloride 107 101 - 111 mmol/L   CO2 24 22 - 32 mmol/L   Glucose, Bld 103 (H) 65 - 99 mg/dL   BUN 15 6 - 20 mg/dL   Creatinine, Ser 0.80 0.44 - 1.00 mg/dL   Calcium 9.0 8.9 - 10.3 mg/dL   GFR calc non Af Amer >60 >60 mL/min   GFR calc Af Amer >60 >60 mL/min    Comment: (NOTE) The eGFR has been calculated using the CKD EPI equation. This calculation has not been validated in all clinical situations. eGFR's persistently <60 mL/min signify possible Chronic Kidney Disease.    Anion gap 9 5 - 15  CBC WITH DIFFERENTIAL     Status: Abnormal   Collection Time: 06/06/15  3:47 AM  Result Value Ref Range   WBC 7.8 4.0 - 10.5 K/uL   RBC 3.82 (L) 3.87 - 5.11 MIL/uL   Hemoglobin 11.4 (L) 12.0 -  15.0 g/dL   HCT 35.2 (L) 36.0 - 46.0 %   MCV 92.1 78.0 - 100.0 fL   MCH 29.8  26.0 - 34.0 pg   MCHC 32.4 30.0 - 36.0 g/dL   RDW 14.2 11.5 - 15.5 %   Platelets 300 150 - 400 K/uL   Neutrophils Relative % 60 %   Neutro Abs 4.7 1.7 - 7.7 K/uL   Lymphocytes Relative 22 %   Lymphs Abs 1.7 0.7 - 4.0 K/uL   Monocytes Relative 12 %   Monocytes Absolute 0.9 0.1 - 1.0 K/uL   Eosinophils Relative 5 %   Eosinophils Absolute 0.4 0.0 - 0.7 K/uL   Basophils Relative 1 %   Basophils Absolute 0.1 0.0 - 0.1 K/uL  Heparin level (unfractionated)     Status: None   Collection Time: 06/06/15  3:48 AM  Result Value Ref Range   Heparin Unfractionated 0.53 0.30 - 0.70 IU/mL    Comment:        IF HEPARIN RESULTS ARE BELOW EXPECTED VALUES, AND PATIENT DOSAGE HAS BEEN CONFIRMED, SUGGEST FOLLOW UP TESTING OF ANTITHROMBIN III LEVELS.      Imaging: Dg Chest Port 1 View  06/05/2015  CLINICAL DATA:  Atrial fibrillation.  COPD.  Former smoker. EXAM: PORTABLE CHEST 1 VIEW COMPARISON:  05/27/2014 FINDINGS: Mild cardiomegaly noted. Increased diffuse interstitial infiltrates are suspicious for mild interstitial edema. No evidence of pulmonary consolidation or pleural effusion. Bilateral shoulder prostheses again noted. IMPRESSION: Mild cardiomegaly and increased diffuse interstitial infiltrates, suspicious for mild edema. Electronically Signed   By: Earle Gell M.D.   On: 06/05/2015 18:00    Assessment:  1. Principal Problem: 2.   Paroxysmal atrial fibrillation with RVR ([place]) 3. Active Problems: 4.   Hypothyroidism 5.   COPD (chronic obstructive pulmonary disease) ([place]) 6.   Acute CHF (congestive heart failure) ([place]) 7.   Acute pulmonary edema ([facility]) 8.   Plan:  1. Remains in a-fib - exam demonstrates mild CHF. Lasix not yet administered. Now rate-controlled. Discussed anticoagulation with her given CHADSVASC score of 4 - I'm recommending Eliquis. D/c heparin and start Eliquis. D/c cardizem 1 hour after starting oral cardizem. 2D echo pending today. Given more than 3 weeks of symptoms,  likely to recommend TEE/Cardioversion on Tuesday after she reaches steady-state on Eliquis and is better diuresed.  Time Spent Directly with Patient:  15 minutes  Length of Stay:    Pixie Casino, MD, [facility] Attending Cardiologist [place] 06/06/2015, 12:13 PM

## 2015-06-06 NOTE — Discharge Instructions (Addendum)
Information on my medicine - ELIQUIS (apixaban)  This medication education was reviewed with me or my healthcare representative as part of my discharge preparation.  The pharmacist that spoke with me during my hospital stay was:  Benny Lennert, [facility]  Why was Eliquis prescribed for you? Eliquis was prescribed for you to reduce the risk of a blood clot forming that can cause a stroke if you have a medical condition called atrial fibrillation (a type of irregular heartbeat).  What do You need to know about Eliquis ? Take your Eliquis TWICE DAILY - one tablet in the morning and one tablet in the evening with or without food. If you have difficulty swallowing the tablet whole please discuss with your pharmacist how to take the medication safely.  Take Eliquis exactly as prescribed by your doctor and DO NOT stop taking Eliquis without talking to the doctor who prescribed the medication.  Stopping may increase your risk of developing a stroke.  Refill your prescription before you run out.  After discharge, you should have regular check-up appointments with your healthcare provider that is prescribing your Eliquis.  In the future your dose may need to be changed if your kidney function or weight changes by a significant amount or as you get older.  What do you do if you miss a dose? If you miss a dose, take it as soon as you remember on the same day and resume taking twice daily.  Do not take more than one dose of ELIQUIS at the same time to make up a missed dose.  Important Safety Information A possible side effect of Eliquis is bleeding. You should call your healthcare provider right away if you experience any of the following: ? Bleeding from an injury or your nose that does not stop. ? Unusual colored urine (red or dark brown) or unusual colored stools (red or black). ? Unusual bruising for unknown reasons. ? A serious fall or if you hit your head (even if there is no  bleeding).  Some medicines may interact with Eliquis and might increase your risk of bleeding or clotting while on Eliquis. To help avoid this, consult your healthcare provider or pharmacist prior to using any new prescription or non-prescription medications, including herbals, vitamins, non-steroidal anti-inflammatory drugs (NSAIDs) and supplements.  This website has more information on Eliquis (apixaban): http://www.eliquis.com/eliquis/home  Atrial Fibrillation Atrial fibrillation is a type of irregular or rapid heartbeat (arrhythmia). In atrial fibrillation, the heart quivers continuously in a chaotic pattern. This occurs when parts of the heart receive disorganized signals that make the heart unable to pump blood normally. This can increase the risk for stroke, heart failure, and other heart-related conditions. There are different types of atrial fibrillation, including:  Paroxysmal atrial fibrillation. This type starts suddenly, and it usually stops on its own shortly after it starts.  Persistent atrial fibrillation. This type often lasts longer than a week. It may stop on its own or with treatment.  Long-lasting persistent atrial fibrillation. This type lasts longer than 12 months.  Permanent atrial fibrillation. This type does not go away. Talk with your health care provider to learn about the type of atrial fibrillation that you have. CAUSES This condition is caused by some heart-related conditions or procedures, including:  A heart attack.  Coronary artery disease.  Heart failure.  Heart valve conditions.  High blood pressure.  Inflammation of the sac that surrounds the heart (pericarditis).  Heart surgery.  Certain heart rhythm disorders, such as Wolf-Parkinson-White  syndrome. Other causes include:  Pneumonia.  Obstructive sleep apnea.  Blockage of an artery in the lungs (pulmonary embolism, or PE).  Lung cancer.  Chronic lung disease.  Thyroid problems,  especially if the thyroid is overactive (hyperthyroidism).  Caffeine.  Excessive alcohol use or illegal drug use.  Use of some medicines, including certain decongestants and diet pills. Sometimes, the cause cannot be found. RISK FACTORS This condition is more likely to develop in:  People who are older in age.  People who smoke.  People who have diabetes mellitus.  People who are overweight (obese).  Athletes who exercise vigorously. SYMPTOMS Symptoms of this condition include:  A feeling that your heart is beating rapidly or irregularly.  A feeling of discomfort or pain in your chest.  Shortness of breath.  Sudden light-headedness or weakness.  Getting tired easily during exercise. In some cases, there are no symptoms. DIAGNOSIS Your health care provider may be able to detect atrial fibrillation when taking your pulse. If detected, this condition may be diagnosed with:  An electrocardiogram (ECG).  A Holter monitor test that records your heartbeat patterns over a 24-hour period.  Transthoracic echocardiogram (TTE) to evaluate how blood flows through your heart.  Transesophageal echocardiogram (TEE) to view more detailed images of your heart.  A stress test.  Imaging tests, such as a CT scan or chest X-ray.  Blood tests. TREATMENT The main goals of treatment are to prevent blood clots from forming and to keep your heart beating at a normal rate and rhythm. The type of treatment that you receive depends on many factors, such as your underlying medical conditions and how you feel when you are experiencing atrial fibrillation. This condition may be treated with:  Medicine to slow down the heart rate, bring the heart's rhythm back to normal, or prevent clots from forming.  Electrical cardioversion. This is a procedure that resets your heart's rhythm by delivering a controlled, low-energy shock to the heart through your skin.  Different types of ablation, such as  catheter ablation, catheter ablation with pacemaker, or surgical ablation. These procedures destroy the heart tissues that send abnormal signals. When the pacemaker is used, it is placed under your skin to help your heart beat in a regular rhythm. HOME CARE INSTRUCTIONS  Take over-the counter and prescription medicines only as told by your health care provider.  If your health care provider prescribed a blood-thinning medicine (anticoagulant), take it exactly as told. Taking too much blood-thinning medicine can cause bleeding. If you do not take enough blood-thinning medicine, you will not have the protection that you need against stroke and other problems.  Do not use tobacco products, including cigarettes, chewing tobacco, and e-cigarettes. If you need help quitting, ask your health care provider.  If you have obstructive sleep apnea, manage your condition as told by your health care provider.  Do not drink alcohol.  Do not drink beverages that contain caffeine, such as coffee, soda, and tea.  Maintain a healthy weight. Do not use diet pills unless your health care provider approves. Diet pills may make heart problems worse.  Follow diet instructions as told by your health care provider.  Exercise regularly as told by your health care provider.  Keep all follow-up visits as told by your health care provider. This is important. PREVENTION  Avoid drinking beverages that contain caffeine or alcohol.  Avoid certain medicines, especially medicines that are used for breathing problems.  Avoid certain herbs and herbal medicines, such as those  that contain ephedra or ginseng.  Do not use illegal drugs, such as cocaine and amphetamines.  Do not smoke.  Manage your high blood pressure. SEEK MEDICAL CARE IF:  You notice a change in the rate, rhythm, or strength of your heartbeat.  You are taking an anticoagulant and you notice increased bruising.  You tire more easily when you  exercise or exert yourself. SEEK IMMEDIATE MEDICAL CARE IF:  You have chest pain, abdominal pain, sweating, or weakness.  You feel nauseous.  You notice blood in your vomit, bowel movement, or urine.  You have shortness of breath.  You suddenly have swollen feet and ankles.  You feel dizzy.  You have sudden weakness or numbness of the face, arm, or leg, especially on one side of the body.  You have trouble speaking, trouble understanding, or both (aphasia).  Your face or your eyelid droops on one side. These symptoms may represent a serious problem that is an emergency. Do not wait to see if the symptoms will go away. Get medical help right away. Call your local emergency services (911 in the [place] Do not drive yourself to the hospital.   This information is not intended to replace advice given to you by your health care provider. Make sure you discuss any questions you have with your health care provider.   Document Released: 05/08/2005 Document Revised: 01/27/2015 Document Reviewed: 09/02/2014 Elsevier Interactive Patient Education [employer]

## 2015-06-06 NOTE — ED Notes (Signed)
Lab tech at the bedside at this time.

## 2015-06-07 DIAGNOSIS — I5041 Acute combined systolic (congestive) and diastolic (congestive) heart failure: Secondary | ICD-10-CM

## 2015-06-07 LAB — CBC WITH DIFFERENTIAL/PLATELET
BASOS ABS: 0.1 10*3/uL (ref 0.0–0.1)
BASOS PCT: 1 %
Eosinophils Absolute: 0.3 10*3/uL (ref 0.0–0.7)
Eosinophils Relative: 5 %
HEMATOCRIT: 37.1 % (ref 36.0–46.0)
Hemoglobin: 12.2 g/dL (ref 12.0–15.0)
Lymphocytes Relative: 20 %
Lymphs Abs: 1.1 10*3/uL (ref 0.7–4.0)
MCH: 29.8 pg (ref 26.0–34.0)
MCHC: 32.9 g/dL (ref 30.0–36.0)
MCV: 90.5 fL (ref 78.0–100.0)
MONO ABS: 0.9 10*3/uL (ref 0.1–1.0)
Monocytes Relative: 16 %
NEUTROS ABS: 3.4 10*3/uL (ref 1.7–7.7)
NEUTROS PCT: 58 %
Platelets: 304 10*3/uL (ref 150–400)
RBC: 4.1 MIL/uL (ref 3.87–5.11)
RDW: 13.8 % (ref 11.5–15.5)
WBC: 5.7 10*3/uL (ref 4.0–10.5)

## 2015-06-07 LAB — BASIC METABOLIC PANEL
ANION GAP: 8 (ref 5–15)
BUN: 12 mg/dL (ref 6–20)
CALCIUM: 9 mg/dL (ref 8.9–10.3)
CO2: 26 mmol/L (ref 22–32)
Chloride: 105 mmol/L (ref 101–111)
Creatinine, Ser: 0.83 mg/dL (ref 0.44–1.00)
GLUCOSE: 105 mg/dL — AB (ref 65–99)
Potassium: 3.7 mmol/L (ref 3.5–5.1)
SODIUM: 139 mmol/L (ref 135–145)

## 2015-06-07 MED ORDER — SODIUM CHLORIDE 0.9 % IJ SOLN
3.0000 mL | INTRAMUSCULAR | Status: DC | PRN
Start: 2015-06-07 — End: 2015-06-08

## 2015-06-07 MED ORDER — METOPROLOL SUCCINATE ER 25 MG PO TB24
12.5000 mg | ORAL_TABLET | Freq: Every day | ORAL | Status: DC
  Administered 2015-06-07 – 2015-06-08 (×2): 12.5 mg via ORAL
  Filled 2015-06-07 (×2): qty 1

## 2015-06-07 MED ORDER — SODIUM CHLORIDE 0.9 % IJ SOLN
3.0000 mL | Freq: Two times a day (BID) | INTRAMUSCULAR | Status: DC
  Administered 2015-06-08: 3 mL via INTRAVENOUS

## 2015-06-07 MED ORDER — OFF THE BEAT BOOK
Freq: Once | Status: AC
  Administered 2015-06-07: 10:00:00
  Filled 2015-06-07: qty 1

## 2015-06-07 MED ORDER — DILTIAZEM HCL ER COATED BEADS 120 MG PO TB24
240.0000 mg | ORAL_TABLET | Freq: Every day | ORAL | Status: DC
  Administered 2015-06-07 – 2015-06-08 (×2): 240 mg via ORAL
  Filled 2015-06-07 (×5): qty 2

## 2015-06-07 NOTE — Progress Notes (Signed)
  DAILY PROGRESS NOTE  Subjective:  HR elevated overnight- diltiazem increased to 240 mg daily. Remains in a-fib. Echo yesterday shows EF 50-55%, there is a trivial pericardial effusion. I's/O's not recorded overnight. Labs normal today. Creatinine is stable.   Objective:  Temp:  [97.8 F (36.6 C)] 97.8 F (36.6 C) (01/16 0503) Pulse Rate:  [95-109] 109 (01/16 0503) Resp:  [18-20] 18 (01/16 0503) BP: (103-129)/(62-86) 129/86 mmHg (01/16 0503) SpO2:  [96 %-97 %] 96 % (01/16 0503) Weight:  [164 lb 7.4 oz (74.6 kg)] 164 lb 7.4 oz (74.6 kg) (01/16 0503) Weight change:   Intake/Output from previous day:    Intake/Output from this shift: Total I/O In: 476 [P.O.:476] Out: -   Medications: Current Facility-Administered Medications  Medication Dose Route Frequency Provider Last Rate Last Dose  . apixaban (ELIQUIS) tablet 5 mg  5 mg Oral BID Rani Idler C Kearsten Ginther, MD   5 mg at 06/07/15 1033  . diltiazem (CARDIZEM LA) 24 hr tablet 240 mg  240 mg Oral Daily Hao Meng, PA   240 mg at 06/07/15 0718  . HYDROcodone-acetaminophen (NORCO/VICODIN) 5-325 MG per tablet 1-2 tablet  1-2 tablet Oral Q4H PRN Waqas T Qureshi, MD      . levothyroxine (SYNTHROID, LEVOTHROID) tablet 175 mcg  175 mcg Oral QAC breakfast Waqas T Qureshi, MD   175 mcg at 06/07/15 0549  . ondansetron (ZOFRAN) tablet 4 mg  4 mg Oral Q6H PRN Waqas T Qureshi, MD       Or  . ondansetron (ZOFRAN) injection 4 mg  4 mg Intravenous Q6H PRN Waqas T Qureshi, MD      . polyethylene glycol (MIRALAX / GLYCOLAX) packet 17 g  17 g Oral Daily PRN Waqas T Qureshi, MD        Physical Exam: General appearance: alert and no distress Neck: no carotid bruit and no JVD Lungs: clear to auscultation bilaterally Heart: irregularly irregular rhythm Abdomen: soft, non-tender; bowel sounds normal; no masses,  no organomegaly Extremities: extremities normal, atraumatic, no cyanosis or edema Pulses: 2+ and symmetric Skin: Skin color, texture, turgor  normal. No rashes or lesions Neurologic: Grossly normal Psych: Pleasant  Lab Results: Results for orders placed or performed during the hospital encounter of 06/05/15 (from the past 48 hour(s))  Basic metabolic panel     Status: Abnormal   Collection Time: 06/05/15  6:00 PM  Result Value Ref Range   Sodium 140 135 - 145 mmol/L   Potassium 4.7 3.5 - 5.1 mmol/L   Chloride 107 101 - 111 mmol/L   CO2 25 22 - 32 mmol/L   Glucose, Bld 103 (H) 65 - 99 mg/dL   BUN 13 6 - 20 mg/dL   Creatinine, Ser 0.78 0.44 - 1.00 mg/dL   Calcium 9.4 8.9 - 10.3 mg/dL   GFR calc non Af Amer >60 >60 mL/min   GFR calc Af Amer >60 >60 mL/min    Comment: (NOTE) The eGFR has been calculated using the CKD EPI equation. This calculation has not been validated in all clinical situations. eGFR's persistently <60 mL/min signify possible Chronic Kidney Disease.    Anion gap 8 5 - 15  CBC with Differential     Status: None   Collection Time: 06/05/15  6:00 PM  Result Value Ref Range   WBC 7.6 4.0 - 10.5 K/uL   RBC 4.31 3.87 - 5.11 MIL/uL   Hemoglobin 12.8 12.0 - 15.0 g/dL   HCT 39.9 36.0 - 46.0 %     MCV 92.6 78.0 - 100.0 fL   MCH 29.7 26.0 - 34.0 pg   MCHC 32.1 30.0 - 36.0 g/dL   RDW 14.2 11.5 - 15.5 %   Platelets 334 150 - 400 K/uL   Neutrophils Relative % 60 %   Neutro Abs 4.6 1.7 - 7.7 K/uL   Lymphocytes Relative 24 %   Lymphs Abs 1.8 0.7 - 4.0 K/uL   Monocytes Relative 11 %   Monocytes Absolute 0.8 0.1 - 1.0 K/uL   Eosinophils Relative 4 %   Eosinophils Absolute 0.3 0.0 - 0.7 K/uL   Basophils Relative 1 %   Basophils Absolute 0.0 0.0 - 0.1 K/uL  Protime-INR (if pt is taking coumadin)     Status: None   Collection Time: 06/05/15  6:00 PM  Result Value Ref Range   Prothrombin Time 14.8 11.6 - 15.2 seconds   INR 1.14 0.00 - 1.49  APTT  (IF patient is taking Pradaxa)     Status: None   Collection Time: 06/05/15  6:00 PM  Result Value Ref Range   aPTT 32 24 - 37 seconds  Magnesium     Status: None     Collection Time: 06/05/15  6:00 PM  Result Value Ref Range   Magnesium 2.1 1.7 - 2.4 mg/dL  Brain natriuretic peptide     Status: Abnormal   Collection Time: 06/05/15  6:00 PM  Result Value Ref Range   B Natriuretic Peptide 195.1 (H) 0.0 - 100.0 pg/mL  I-stat troponin, ED (not at [facility], [facility])     Status: None   Collection Time: 06/05/15  6:08 PM  Result Value Ref Range   Troponin i, poc 0.01 0.00 - 0.08 ng/mL   Comment 3            Comment: Due to the release kinetics of cTnI, a negative result within the first hours of the onset of symptoms does not rule out myocardial infarction with certainty. If myocardial infarction is still suspected, repeat the test at appropriate intervals.   TSH     Status: Abnormal   Collection Time: 06/05/15  7:46 PM  Result Value Ref Range   TSH 0.016 (L) 0.350 - 4.500 uIU/mL  Basic metabolic panel     Status: Abnormal   Collection Time: 06/06/15  3:47 AM  Result Value Ref Range   Sodium 140 135 - 145 mmol/L   Potassium 4.0 3.5 - 5.1 mmol/L   Chloride 107 101 - 111 mmol/L   CO2 24 22 - 32 mmol/L   Glucose, Bld 103 (H) 65 - 99 mg/dL   BUN 15 6 - 20 mg/dL   Creatinine, Ser 0.80 0.44 - 1.00 mg/dL   Calcium 9.0 8.9 - 10.3 mg/dL   GFR calc non Af Amer >60 >60 mL/min   GFR calc Af Amer >60 >60 mL/min    Comment: (NOTE) The eGFR has been calculated using the CKD EPI equation. This calculation has not been validated in all clinical situations. eGFR's persistently <60 mL/min signify possible Chronic Kidney Disease.    Anion gap 9 5 - 15  CBC WITH DIFFERENTIAL     Status: Abnormal   Collection Time: 06/06/15  3:47 AM  Result Value Ref Range   WBC 7.8 4.0 - 10.5 K/uL   RBC 3.82 (L) 3.87 - 5.11 MIL/uL   Hemoglobin 11.4 (L) 12.0 - 15.0 g/dL   HCT 35.2 (L) 36.0 - 46.0 %   MCV 92.1 78.0 - 100.0 fL   MCH   29.8 26.0 - 34.0 pg   MCHC 32.4 30.0 - 36.0 g/dL   RDW 14.2 11.5 - 15.5 %   Platelets 300 150 - 400 K/uL   Neutrophils Relative % 60 %   Neutro  Abs 4.7 1.7 - 7.7 K/uL   Lymphocytes Relative 22 %   Lymphs Abs 1.7 0.7 - 4.0 K/uL   Monocytes Relative 12 %   Monocytes Absolute 0.9 0.1 - 1.0 K/uL   Eosinophils Relative 5 %   Eosinophils Absolute 0.4 0.0 - 0.7 K/uL   Basophils Relative 1 %   Basophils Absolute 0.1 0.0 - 0.1 K/uL  Heparin level (unfractionated)     Status: None   Collection Time: 06/06/15  3:48 AM  Result Value Ref Range   Heparin Unfractionated 0.53 0.30 - 0.70 IU/mL    Comment:        IF HEPARIN RESULTS ARE BELOW EXPECTED VALUES, AND PATIENT DOSAGE HAS BEEN CONFIRMED, SUGGEST FOLLOW UP TESTING OF ANTITHROMBIN III LEVELS.   Basic metabolic panel     Status: Abnormal   Collection Time: 06/07/15  5:26 AM  Result Value Ref Range   Sodium 139 135 - 145 mmol/L   Potassium 3.7 3.5 - 5.1 mmol/L   Chloride 105 101 - 111 mmol/L   CO2 26 22 - 32 mmol/L   Glucose, Bld 105 (H) 65 - 99 mg/dL   BUN 12 6 - 20 mg/dL   Creatinine, Ser 0.83 0.44 - 1.00 mg/dL   Calcium 9.0 8.9 - 10.3 mg/dL   GFR calc non Af Amer >60 >60 mL/min   GFR calc Af Amer >60 >60 mL/min    Comment: (NOTE) The eGFR has been calculated using the CKD EPI equation. This calculation has not been validated in all clinical situations. eGFR's persistently <60 mL/min signify possible Chronic Kidney Disease.    Anion gap 8 5 - 15  CBC WITH DIFFERENTIAL     Status: None   Collection Time: 06/07/15  5:26 AM  Result Value Ref Range   WBC 5.7 4.0 - 10.5 K/uL   RBC 4.10 3.87 - 5.11 MIL/uL   Hemoglobin 12.2 12.0 - 15.0 g/dL   HCT 37.1 36.0 - 46.0 %   MCV 90.5 78.0 - 100.0 fL   MCH 29.8 26.0 - 34.0 pg   MCHC 32.9 30.0 - 36.0 g/dL   RDW 13.8 11.5 - 15.5 %   Platelets 304 150 - 400 K/uL   Neutrophils Relative % 58 %   Neutro Abs 3.4 1.7 - 7.7 K/uL   Lymphocytes Relative 20 %   Lymphs Abs 1.1 0.7 - 4.0 K/uL   Monocytes Relative 16 %   Monocytes Absolute 0.9 0.1 - 1.0 K/uL   Eosinophils Relative 5 %   Eosinophils Absolute 0.3 0.0 - 0.7 K/uL   Basophils  Relative 1 %   Basophils Absolute 0.1 0.0 - 0.1 K/uL     Imaging: Dg Chest Port 1 View  06/05/2015  CLINICAL DATA:  Atrial fibrillation.  COPD.  Former smoker. EXAM: PORTABLE CHEST 1 VIEW COMPARISON:  05/27/2014 FINDINGS: Mild cardiomegaly noted. Increased diffuse interstitial infiltrates are suspicious for mild interstitial edema. No evidence of pulmonary consolidation or pleural effusion. Bilateral shoulder prostheses again noted. IMPRESSION: Mild cardiomegaly and increased diffuse interstitial infiltrates, suspicious for mild edema. Electronically Signed   By: John  Stahl M.D.   On: 06/05/2015 18:00    Assessment:  Principal Problem:   Paroxysmal atrial fibrillation with RVR (HCC) Active Problems:   Hypothyroidism     COPD (chronic obstructive pulmonary disease) (HCC)   Acute CHF (congestive heart failure) (HCC)   Acute pulmonary edema (HCC)   Plan:  1. Feels much better. Echo shows low normal EF. She is euvolemic on exam. Rate still going up with exertion, despite increase in cardizem dose. Add low dose b-blocker for additional rate control. Plan for TEE/Cardioversion tomorrow at 2pm.  Time Spent Directly with Patient:  15 minutes  Length of Stay:  LOS: 1 day   Anelise Staron C. Shima Compere, MD, FACC Attending Cardiologist [facility]  Shun Pletz C Rosha Cocker 06/07/2015, 12:07 PM     

## 2015-06-07 NOTE — Progress Notes (Addendum)
HR uncontrolled with HR 130s with Diltiazem CD 120mg , SBP 120s, will order diltiazem CD 240mg  given now.  Ramond DialSigned, Camari Wisham PA Pager: [phone]

## 2015-06-07 NOTE — Clinical Documentation Improvement (Signed)
Cardiology  Can the diagnosis of Acute CHF be further specified by type if appropriate? Thank you    Diastoloic  Systolic  Other  Clinically Undetermined     Please exercise your independent, professional judgment when responding. A specific answer is not anticipated or expected.   Thank You,  Lavonda JumboLawanda J Jayln [facility] [employer] [facility] [place] [phone]

## 2015-06-07 NOTE — Progress Notes (Addendum)
With ambulation pt HR reaches 160s-170s pt states that she feels tired. RN instructed pt to sit and rest. RN will reassess after pt has rested to ensure HR will come down. Throughout the evening HR still afib in the 110s-120s. Lisabeth Devoid, PA notified.    Will continue to monitor.   Edgardo Roys

## 2015-06-08 ENCOUNTER — Encounter (HOSPITAL_COMMUNITY): Payer: Self-pay

## 2015-06-08 ENCOUNTER — Encounter (HOSPITAL_COMMUNITY)
Admission: EM | Disposition: A | Discharge status: Home / Self Care | Payer: Self-pay | Source: Home / Self Care | Attending: Internal Medicine

## 2015-06-08 ENCOUNTER — Inpatient Hospital Stay (HOSPITAL_COMMUNITY): Payer: Medicare Other | Admitting: Anesthesiology

## 2015-06-08 ENCOUNTER — Inpatient Hospital Stay (HOSPITAL_COMMUNITY): Payer: Medicare Other

## 2015-06-08 DIAGNOSIS — I340 Nonrheumatic mitral (valve) insufficiency: Secondary | ICD-10-CM

## 2015-06-08 DIAGNOSIS — I4891 Unspecified atrial fibrillation: Secondary | ICD-10-CM | POA: Insufficient documentation

## 2015-06-08 DIAGNOSIS — I5041 Acute combined systolic (congestive) and diastolic (congestive) heart failure: Secondary | ICD-10-CM | POA: Insufficient documentation

## 2015-06-08 HISTORY — PX: TEE WITHOUT CARDIOVERSION: SHX5443

## 2015-06-08 HISTORY — PX: CARDIOVERSION: SHX1299

## 2015-06-08 LAB — BASIC METABOLIC PANEL
ANION GAP: 8 (ref 5–15)
BUN: 11 mg/dL (ref 6–20)
CALCIUM: 9.5 mg/dL (ref 8.9–10.3)
CHLORIDE: 103 mmol/L (ref 101–111)
CO2: 30 mmol/L (ref 22–32)
CREATININE: 0.78 mg/dL (ref 0.44–1.00)
GFR calc Af Amer: >60 mL/min (ref >60)
GFR calc non Af Amer: >60 mL/min (ref >60)
GLUCOSE: 96 mg/dL (ref 65–99)
Potassium: 3.9 mmol/L (ref 3.5–5.1)
Sodium: 141 mmol/L (ref 135–145)

## 2015-06-08 LAB — CBC WITH DIFFERENTIAL/PLATELET
BASOS PCT: 0 %
Basophils Absolute: 0 10*3/uL (ref 0.0–0.1)
EOS ABS: 0.4 10*3/uL (ref 0.0–0.7)
EOS PCT: 5 %
HEMATOCRIT: 37.4 % (ref 36.0–46.0)
HEMOGLOBIN: 12.1 g/dL (ref 12.0–15.0)
LYMPHS PCT: 22 %
Lymphs Abs: 1.6 10*3/uL (ref 0.7–4.0)
MCH: 29.6 pg (ref 26.0–34.0)
MCHC: 32.4 g/dL (ref 30.0–36.0)
MCV: 91.4 fL (ref 78.0–100.0)
Monocytes Absolute: 0.9 10*3/uL (ref 0.1–1.0)
Monocytes Relative: 13 %
NEUTROS ABS: 4.4 10*3/uL (ref 1.7–7.7)
NEUTROS PCT: 60 %
Platelets: 329 10*3/uL (ref 150–400)
RBC: 4.09 MIL/uL (ref 3.87–5.11)
RDW: 13.8 % (ref 11.5–15.5)
WBC: 7.3 10*3/uL (ref 4.0–10.5)

## 2015-06-08 SURGERY — ECHOCARDIOGRAM, TRANSESOPHAGEAL
Anesthesia: General

## 2015-06-08 MED ORDER — BUTAMBEN-TETRACAINE-BENZOCAINE 2-2-14 % EX AERO
INHALATION_SPRAY | CUTANEOUS | Status: DC | PRN
  Administered 2015-06-08: 2 via TOPICAL

## 2015-06-08 MED ORDER — PROPOFOL 500 MG/50ML IV EMUL
INTRAVENOUS | Status: DC | PRN
  Administered 2015-06-08: 100 ug/kg/min via INTRAVENOUS

## 2015-06-08 MED ORDER — LEVOTHYROXINE SODIUM 150 MCG PO TABS: ORAL_TABLET | ORAL | Status: DC

## 2015-06-08 MED ORDER — SODIUM CHLORIDE 0.9 % IV SOLN
INTRAVENOUS | Status: DC | PRN
  Administered 2015-06-08: 14:00:00 via INTRAVENOUS

## 2015-06-08 MED ORDER — LEVOTHYROXINE SODIUM 150 MCG PO TABS: 150.0000 ug | ORAL_TABLET | Freq: Every day | ORAL | Status: DC

## 2015-06-08 MED ORDER — DILTIAZEM HCL ER COATED BEADS 120 MG PO TB24: 240.0000 mg | ORAL_TABLET | Freq: Every day | ORAL | Status: DC

## 2015-06-08 MED ORDER — APIXABAN 5 MG PO TABS: 5.0000 mg | ORAL_TABLET | Freq: Two times a day (BID) | ORAL | Status: DC

## 2015-06-08 MED ORDER — SODIUM CHLORIDE 0.9 % IV SOLN: 250.0000 mL | INTRAVENOUS | Status: DC | PRN

## 2015-06-08 MED ORDER — METOPROLOL SUCCINATE ER 25 MG PO TB24: 12.5000 mg | ORAL_TABLET | Freq: Every day | ORAL | Status: DC

## 2015-06-08 MED ORDER — LIDOCAINE HCL (CARDIAC) 20 MG/ML IV SOLN
INTRAVENOUS | Status: DC | PRN
  Administered 2015-06-08: 80 mg via INTRAVENOUS

## 2015-06-08 MED ORDER — ONDANSETRON HCL 4 MG/2ML IJ SOLN: 4.0000 mg | Freq: Once | INTRAMUSCULAR | Status: DC | PRN

## 2015-06-08 MED ORDER — PROPOFOL 10 MG/ML IV BOLUS
INTRAVENOUS | Status: DC | PRN
  Administered 2015-06-08: 20 mg via INTRAVENOUS
  Administered 2015-06-08: 10 mg via INTRAVENOUS

## 2015-06-08 NOTE — Discharge Summary (Signed)
Discharge Summary    Patient ID: Katrina Velez,  MRN: [id number], DOB/AGE: 1926/04/14 82 y.o.  Admit date: 06/05/2015 Discharge date: 06/08/2015  Primary Care Provider: Earl Lites A Primary Cardiologist: Charlton Haws MD  Discharge Diagnoses    Principal Problem:   Paroxysmal atrial fibrillation with RVR (HCC) Active Problems:   Hypothyroidism   COPD (chronic obstructive pulmonary disease) (HCC)   Acute CHF (congestive heart failure) (HCC)   Acute pulmonary edema (HCC)   Atrial fibrillation with RVR (HCC)   Acute combined systolic and diastolic congestive heart failure, NYHA class 2 (HCC)   Allergies Banana and its products - neusea Penicillins - Hives   Diagnostic Studies/Procedures    Transthoracic Echocardiography 06/06/2015 LV EF: 50% -  55%  ------------------------------------------------------------------- Indications:   Atrial fibrillation - 427.31.  ------------------------------------------------------------------- History:  PMH:  Congestive heart failure. Chronic obstructive pulmonary disease. Risk factors: Pulmonary edema.  ------------------------------------------------------------------- Study Conclusions  - Left ventricle: The cavity size was normal. Wall thickness was normal. Systolic function was normal. The estimated ejection fraction was in the range of 50% to 55%. Wall motion was normal; there were no regional wall motion abnormalities. - Aortic valve: There was trivial regurgitation. - Mitral valve: Calcified annulus. There was mild regurgitation. - Left atrium: The atrium was mildly dilated. - Right atrium: The atrium was mildly dilated. - Tricuspid valve: There was moderate regurgitation. - Pericardium, extracardiac: A trivial pericardial effusion was identified.  Transesophageal Echocardiography 06/08/15  LV EF: 55% -  60%  ------------------------------------------------------------------- Indications:   Atrial  fibrillation - 427.31.  ------------------------------------------------------------------- Study Conclusions  - Left ventricle: The cavity size was normal. Wall thickness was normal. Systolic function was normal. The estimated ejection fraction was in the range of 55% to 60%. - Aortic valve: There was trivial regurgitation. - Mitral valve: There was mild regurgitation. - Left atrium: No evidence of thrombus in the atrial cavity or appendage. No evidence of thrombus in the appendage. - Right atrium: No evidence of thrombus in the atrial cavity or appendage. - Tricuspid valve: There was moderate regurgitation.  Electrical Cardioversion 06/08/15  The patient was NPO after midnight. Anesthesia was administered at the beside by Dr. Jean Rosenthal with propofol. Cardioversion was performed with synchronized biphasic defibrillation via AP pads with 120 joules. 1 attempt(s) were performed. The patient converted to normal sinus rhythm. The patient tolerated the procedure well   IMPRESSION:  Successful cardioversion of atrial fibrillation.  TEE with normal EF. No thrombus.  History of Present Illness     This is a 82 y.o.white female with no history of CAD and but has history of carpel tunnel syndrome, OA, HTN, COPD.  Patient has been having the feeling that she has a cough but she could not cough. She was given a cough syrup in 02/2015. This continued on and she continued to have tired and fatigued. She went to the urgent care and was found to be afib RVR c and sent to ED 06/05/15 for further evaluation. She has been taking delsyn recently. She has been feeling SOB as well with these symptoms. She denied PND, orthopnea or leg swelling  EMS gave 10 mg of IV cardiazem. CXR showed that there might be some intersitital edema and cardiomegaly.   Hospital Course     Consultants: None  The patient started on IV heparin initially and then transferred to Eliquis. CHADSVASC score of 4. Rate  was controlled on cardizem gtt, however was difficult to control after starting po cardizem CD. Echo  yesterday shows EF 50-55%, no WM abnormality, trivial pericardial effusion, mild dilated LA and RA.  Rate was still going up with exertion despite increased cardizem dose. Added BB, however no improvement of rate. TEE with normal EF, no thrombus. S/p successful DCCV at 120J x 1. Maintained sinus rhythm.   BNP mildly elevated at 195. With pulmonary edema given IV lasix  X 2 with significant improvement.   The patient has been seen by Dr.Hilty  today and deemed ready for discharge home. All follow-up appointments have been scheduled. Discharge medications are listed below.   The patient has hypothyrodism and takes synthroid . TSH level was low at 0.016. Pharmacist Silvana Newness) recommended reducing dose. Will decrease dose to 150 mvq and f/u with PCP in 4-6 weeks.   LDL of 106 - 05/27/2014. Will need repeat lipid panel during post hospital appointment and possible statin initiation .   Discharge Vitals Blood pressure 92/58, pulse 64, temperature 98.2 F (36.8 C), temperature source Oral, resp. rate 20, height  (1.6 m), weight 156 lb 1.4 oz (70.8 kg), SpO2 95 %.  Filed Weights   06/06/15 1001 06/07/15 0503 06/08/15 0351  Weight: 159 lb 11.2 oz (72.439 kg) 164 lb 7.4 oz (74.6 kg) 156 lb 1.4 oz (70.8 kg)    Labs & Radiologic Studies     CBC  Recent Labs  06/07/15 0526 06/08/15 0317  WBC 5.7 7.3  NEUTROABS 3.4 4.4  HGB 12.2 12.1  HCT 37.1 37.4  MCV 90.5 91.4  PLT 304 329   Basic Metabolic Panel  Recent Labs  06/05/15 1800  06/07/15 0526 06/08/15 0317  NA 140  < > 139 141  K 4.7  < > 3.7 3.9  CL 107  < > 105 103  CO2 25  < > 26 30  GLUCOSE 103*  < > 105* 96  BUN 13  < > 12 11  CREATININE 0.78  < > 0.83 0.78  CALCIUM 9.4  < > 9.0 9.5  MG 2.1  --   --   --   < > = values in this interval not displayed.   Recent Labs  06/05/15 1946  TSH 0.016*    Dg  Chest Port 1 View  06/05/2015  CLINICAL DATA:  Atrial fibrillation.  COPD.  Former smoker. EXAM: PORTABLE CHEST 1 VIEW COMPARISON:  05/27/2014 FINDINGS: Mild cardiomegaly noted. Increased diffuse interstitial infiltrates are suspicious for mild interstitial edema. No evidence of pulmonary consolidation or pleural effusion. Bilateral shoulder prostheses again noted. IMPRESSION: Mild cardiomegaly and increased diffuse interstitial infiltrates, suspicious for mild edema. Electronically Signed   By: Myles Rosenthal M.D.   On: 06/05/2015 18:00    Disposition   Pt is being discharged home today in good condition.  Follow-up Plans & Appointments    Follow-up Information    Follow up with Jacolyn Reedy, PA-C. Go on 06/15/2015.   Specialty:  Cardiology   Why:  :45 for post hospital    Contact information:   [address] [place] [address] [phone]       Schedule an appointment as soon as possible for a visit with DAUB, Stan Head, MD.   Specialty:  Family Medicine   Why:  4 weeks for hypothyroidism f/u. TSH was low on admission - adjused medication on discharge per pharmacy recommendation.    Contact information:   [address] [place] [address] [phone]      Discharge Instructions    Amb  referral to [facility]    Complete by:  As directed      Diet - low sodium heart healthy    Complete by:  As directed      Discharge instructions    Complete by:  As directed   Follow up with PCP in 4 weeks for hypothyroidism.     Increase activity slowly    Complete by:  As directed            Discharge Medications   Current Discharge Medication List    START taking these medications   Details  apixaban (ELIQUIS) 5 MG TABS tablet Take 1 tablet (5 mg total) by mouth 2 (two) times daily. Qty: 60 tablet, Refills: 6    diltiazem (CARDIZEM LA) 120 MG 24 hr tablet Take 2 tablets (240 mg total) by mouth daily. Qty: 30 tablet, Refills: 6    metoprolol succinate  (TOPROL-XL) 25 MG 24 hr tablet Take 0.5 tablets (12.5 mg total) by mouth daily. Qty: 30 tablet, Refills: 3      CONTINUE these medications which have CHANGED   Details  levothyroxine (SYNTHROID, LEVOTHROID) 150 MCG tablet Take 1 tablet (150 mcg total) by mouth daily before breakfast. "OFFICE VISIT NEEDED FOR REFILLS" Qty: 30 tablet, Refills: 1      CONTINUE these medications which have NOT CHANGED   Details  ascorbic acid (VITAMIN C) 500 MG tablet Take 1,000 mg by mouth daily.    Diphenhydramine-PE-APAP (DELSYM COUGH/COLD NIGHT TIME PO) Take 10 mLs by mouth 2 (two) times daily as needed (for cough).    lidocaine (XYLOCAINE) 2 % solution Use as directed 20 mLs in the mouth or throat as needed for mouth pain. Qty: 100 mL, Refills: 0    Multiple Vitamin (MULTIVITAMIN) capsule Take 1 capsule by mouth daily.    vitamin E 400 UNIT capsule Take 400 Units by mouth daily.    HYDROcodone-homatropine (HYCODAN) 5-1.5 MG/5ML syrup Take 5 mLs by mouth every 8 (eight) hours as needed for cough. Qty: 120 mL, Refills: 0   Associated Diagnoses: Acute bronchitis, unspecified organism      STOP taking these medications     aspirin 325 MG EC tablet            Outstanding Labs/Studies  None  Duration of Discharge Encounter   Greater than 30 minutes including physician time.  Signed, Xianna Siverling PA-C 06/08/2015, 4:46 PM

## 2015-06-08 NOTE — Progress Notes (Signed)
Echocardiogram Echocardiogram Transesophageal has been performed.  Katrina Velez 06/08/2015, 3:01 PM

## 2015-06-08 NOTE — H&P (View-Only) (Signed)
DAILY PROGRESS NOTE  Subjective:  HR elevated overnight- diltiazem increased to 240 mg daily. Remains in a-fib. Echo yesterday shows EF 50-55%, there is a trivial pericardial effusion. I's/O's not recorded overnight. Labs normal today. Creatinine is stable.   Objective:  Temp:  [97.8 F (36.6 C)] 97.8 F (36.6 C) (01/16 0503) Pulse Rate:  [95-109] 109 (01/16 0503) Resp:  [18-20] 18 (01/16 0503) BP: (103-129)/(62-86) 129/86 mmHg (01/16 0503) SpO2:  [96 %-97 %] 96 % (01/16 0503) Weight:  [164 lb 7.4 oz (74.6 kg)] 164 lb 7.4 oz (74.6 kg) (01/16 0503) Weight change:   Intake/Output from previous day:    Intake/Output from this shift: Total I/O In: 476 [P.O.:476] Out: -   Medications: Current Facility-Administered Medications  Medication Dose Route Frequency Provider Last Rate Last Dose  . apixaban (ELIQUIS) tablet 5 mg  5 mg Oral BID Pixie Casino, MD   5 mg at 06/07/15 1033  . diltiazem (CARDIZEM LA) 24 hr tablet 240 mg  240 mg Oral Daily Almyra Deforest, [place]   240 mg at 06/07/15 0718  . HYDROcodone-acetaminophen (NORCO/VICODIN) 5-325 MG per tablet 1-2 tablet  1-2 tablet Oral Q4H PRN Flossie Dibble, MD      . levothyroxine (SYNTHROID, LEVOTHROID) tablet 175 mcg  175 mcg Oral QAC breakfast Flossie Dibble, MD   175 mcg at 06/07/15 0549  . ondansetron (ZOFRAN) tablet 4 mg  4 mg Oral Q6H PRN Flossie Dibble, MD       Or  . ondansetron (ZOFRAN) injection 4 mg  4 mg Intravenous Q6H PRN Flossie Dibble, MD      . polyethylene glycol (MIRALAX / GLYCOLAX) packet 17 g  17 g Oral Daily PRN Flossie Dibble, MD        Physical Exam: General appearance: alert and no distress Neck: no carotid bruit and no JVD Lungs: clear to auscultation bilaterally Heart: irregularly irregular rhythm Abdomen: soft, non-tender; bowel sounds normal; no masses,  no organomegaly Extremities: extremities normal, atraumatic, no cyanosis or edema Pulses: 2+ and symmetric Skin: Skin color, texture, turgor  normal. No rashes or lesions Neurologic: Grossly normal Psych: Pleasant  Lab Results: Results for orders placed or performed during the hospital encounter of 06/05/15 (from the past 48 hour(s))  Basic metabolic panel     Status: Abnormal   Collection Time: 06/05/15  6:00 PM  Result Value Ref Range   Sodium 140 135 - 145 mmol/L   Potassium 4.7 3.5 - 5.1 mmol/L   Chloride 107 101 - 111 mmol/L   CO2 25 22 - 32 mmol/L   Glucose, Bld 103 (H) 65 - 99 mg/dL   BUN 13 6 - 20 mg/dL   Creatinine, Ser 0.78 0.44 - 1.00 mg/dL   Calcium 9.4 8.9 - 10.3 mg/dL   GFR calc non Af Amer >60 >60 mL/min   GFR calc Af Amer >60 >60 mL/min    Comment: (NOTE) The eGFR has been calculated using the CKD EPI equation. This calculation has not been validated in all clinical situations. eGFR's persistently <60 mL/min signify possible Chronic Kidney Disease.    Anion gap 8 5 - 15  CBC with Differential     Status: None   Collection Time: 06/05/15  6:00 PM  Result Value Ref Range   WBC 7.6 4.0 - 10.5 K/uL   RBC 4.31 3.87 - 5.11 MIL/uL   Hemoglobin 12.8 12.0 - 15.0 g/dL   HCT 39.9 36.0 - 46.0 %  MCV 92.6 78.0 - 100.0 fL   MCH 29.7 26.0 - 34.0 pg   MCHC 32.1 30.0 - 36.0 g/dL   RDW 14.2 11.5 - 15.5 %   Platelets 334 150 - 400 K/uL   Neutrophils Relative % 60 %   Neutro Abs 4.6 1.7 - 7.7 K/uL   Lymphocytes Relative 24 %   Lymphs Abs 1.8 0.7 - 4.0 K/uL   Monocytes Relative 11 %   Monocytes Absolute 0.8 0.1 - 1.0 K/uL   Eosinophils Relative 4 %   Eosinophils Absolute 0.3 0.0 - 0.7 K/uL   Basophils Relative 1 %   Basophils Absolute 0.0 0.0 - 0.1 K/uL  Protime-INR (if pt is taking coumadin)     Status: None   Collection Time: 06/05/15  6:00 PM  Result Value Ref Range   Prothrombin Time 14.8 11.6 - 15.2 seconds   INR 1.14 0.00 - 1.49  APTT  (IF patient is taking Pradaxa)     Status: None   Collection Time: 06/05/15  6:00 PM  Result Value Ref Range   aPTT 32 24 - 37 seconds  Magnesium     Status: None     Collection Time: 06/05/15  6:00 PM  Result Value Ref Range   Magnesium 2.1 1.7 - 2.4 mg/dL  Brain natriuretic peptide     Status: Abnormal   Collection Time: 06/05/15  6:00 PM  Result Value Ref Range   B Natriuretic Peptide 195.1 (H) 0.0 - 100.0 pg/mL  I-stat troponin, ED (not at [facility], [facility])     Status: None   Collection Time: 06/05/15  6:08 PM  Result Value Ref Range   Troponin i, poc 0.01 0.00 - 0.08 ng/mL   Comment 3            Comment: Due to the release kinetics of cTnI, a negative result within the first hours of the onset of symptoms does not rule out myocardial infarction with certainty. If myocardial infarction is still suspected, repeat the test at appropriate intervals.   TSH     Status: Abnormal   Collection Time: 06/05/15  7:46 PM  Result Value Ref Range   TSH 0.016 (L) 0.350 - 4.500 uIU/mL  Basic metabolic panel     Status: Abnormal   Collection Time: 06/06/15  3:47 AM  Result Value Ref Range   Sodium 140 135 - 145 mmol/L   Potassium 4.0 3.5 - 5.1 mmol/L   Chloride 107 101 - 111 mmol/L   CO2 24 22 - 32 mmol/L   Glucose, Bld 103 (H) 65 - 99 mg/dL   BUN 15 6 - 20 mg/dL   Creatinine, Ser 0.80 0.44 - 1.00 mg/dL   Calcium 9.0 8.9 - 10.3 mg/dL   GFR calc non Af Amer >60 >60 mL/min   GFR calc Af Amer >60 >60 mL/min    Comment: (NOTE) The eGFR has been calculated using the CKD EPI equation. This calculation has not been validated in all clinical situations. eGFR's persistently <60 mL/min signify possible Chronic Kidney Disease.    Anion gap 9 5 - 15  CBC WITH DIFFERENTIAL     Status: Abnormal   Collection Time: 06/06/15  3:47 AM  Result Value Ref Range   WBC 7.8 4.0 - 10.5 K/uL   RBC 3.82 (L) 3.87 - 5.11 MIL/uL   Hemoglobin 11.4 (L) 12.0 - 15.0 g/dL   HCT 35.2 (L) 36.0 - 46.0 %   MCV 92.1 78.0 - 100.0 fL   MCH  29.8 26.0 - 34.0 pg   MCHC 32.4 30.0 - 36.0 g/dL   RDW 14.2 11.5 - 15.5 %   Platelets 300 150 - 400 K/uL   Neutrophils Relative % 60 %   Neutro  Abs 4.7 1.7 - 7.7 K/uL   Lymphocytes Relative 22 %   Lymphs Abs 1.7 0.7 - 4.0 K/uL   Monocytes Relative 12 %   Monocytes Absolute 0.9 0.1 - 1.0 K/uL   Eosinophils Relative 5 %   Eosinophils Absolute 0.4 0.0 - 0.7 K/uL   Basophils Relative 1 %   Basophils Absolute 0.1 0.0 - 0.1 K/uL  Heparin level (unfractionated)     Status: None   Collection Time: 06/06/15  3:48 AM  Result Value Ref Range   Heparin Unfractionated 0.53 0.30 - 0.70 IU/mL    Comment:        IF HEPARIN RESULTS ARE BELOW EXPECTED VALUES, AND PATIENT DOSAGE HAS BEEN CONFIRMED, SUGGEST FOLLOW UP TESTING OF ANTITHROMBIN III LEVELS.   Basic metabolic panel     Status: Abnormal   Collection Time: 06/07/15  5:26 AM  Result Value Ref Range   Sodium 139 135 - 145 mmol/L   Potassium 3.7 3.5 - 5.1 mmol/L   Chloride 105 101 - 111 mmol/L   CO2 26 22 - 32 mmol/L   Glucose, Bld 105 (H) 65 - 99 mg/dL   BUN 12 6 - 20 mg/dL   Creatinine, Ser 0.83 0.44 - 1.00 mg/dL   Calcium 9.0 8.9 - 10.3 mg/dL   GFR calc non Af Amer >60 >60 mL/min   GFR calc Af Amer >60 >60 mL/min    Comment: (NOTE) The eGFR has been calculated using the CKD EPI equation. This calculation has not been validated in all clinical situations. eGFR's persistently <60 mL/min signify possible Chronic Kidney Disease.    Anion gap 8 5 - 15  CBC WITH DIFFERENTIAL     Status: None   Collection Time: 06/07/15  5:26 AM  Result Value Ref Range   WBC 5.7 4.0 - 10.5 K/uL   RBC 4.10 3.87 - 5.11 MIL/uL   Hemoglobin 12.2 12.0 - 15.0 g/dL   HCT 37.1 36.0 - 46.0 %   MCV 90.5 78.0 - 100.0 fL   MCH 29.8 26.0 - 34.0 pg   MCHC 32.9 30.0 - 36.0 g/dL   RDW 13.8 11.5 - 15.5 %   Platelets 304 150 - 400 K/uL   Neutrophils Relative % 58 %   Neutro Abs 3.4 1.7 - 7.7 K/uL   Lymphocytes Relative 20 %   Lymphs Abs 1.1 0.7 - 4.0 K/uL   Monocytes Relative 16 %   Monocytes Absolute 0.9 0.1 - 1.0 K/uL   Eosinophils Relative 5 %   Eosinophils Absolute 0.3 0.0 - 0.7 K/uL   Basophils  Relative 1 %   Basophils Absolute 0.1 0.0 - 0.1 K/uL     Imaging: Dg Chest Port 1 View  06/05/2015  CLINICAL DATA:  Atrial fibrillation.  COPD.  Former smoker. EXAM: PORTABLE CHEST 1 VIEW COMPARISON:  05/27/2014 FINDINGS: Mild cardiomegaly noted. Increased diffuse interstitial infiltrates are suspicious for mild interstitial edema. No evidence of pulmonary consolidation or pleural effusion. Bilateral shoulder prostheses again noted. IMPRESSION: Mild cardiomegaly and increased diffuse interstitial infiltrates, suspicious for mild edema. Electronically Signed   By: Earle Gell M.D.   On: 06/05/2015 18:00    Assessment:  Principal Problem:   Paroxysmal atrial fibrillation with RVR (HCC) Active Problems:   Hypothyroidism  COPD (chronic obstructive pulmonary disease) (HCC)   Acute CHF (congestive heart failure) (HCC)   Acute pulmonary edema (HCC)   Plan:  1. Feels much better. Echo shows low normal EF. She is euvolemic on exam. Rate still going up with exertion, despite increase in cardizem dose. Add low dose b-blocker for additional rate control. Plan for TEE/Cardioversion tomorrow at 2pm.  Time Spent Directly with Patient:  15 minutes  Length of Stay:  LOS: 1 day   Pixie Casino, MD, [facility] Attending Cardiologist [place] 06/07/2015, 12:07 PM

## 2015-06-08 NOTE — Interval H&P Note (Signed)
History and Physical Interval Note:  06/08/2015 2:03 PM  Katrina Velez  has presented today for surgery, with the diagnosis of AFIB  The various methods of treatment have been discussed with the patient and family. After consideration of risks, benefits and other options for treatment, the patient has consented to  Procedure(s): TRANSESOPHAGEAL ECHOCARDIOGRAM (TEE) (N/A) CARDIOVERSION (N/A) as a surgical intervention .  The patient's history has been reviewed, patient examined, no change in status, stable for surgery.  I have reviewed the patient's chart and labs.  Questions were answered to the patient's satisfaction.     Eithel Ryall

## 2015-06-08 NOTE — Progress Notes (Signed)
Patient Name: Katrina Velez Date of Encounter: 06/08/2015   SUBJECTIVE  Rate is elevated to 130-150s. Asymptomatic. No chest pain, sob or palpations.   CURRENT MEDS . apixaban  5 mg Oral BID  . diltiazem  240 mg Oral Daily  . levothyroxine  175 mcg Oral QAC breakfast  . metoprolol succinate  12.5 mg Oral Daily  . sodium chloride  3 mL Intravenous Q12H    OBJECTIVE  Filed Vitals:   06/07/15 2032 06/08/15 0351 06/08/15 0544 06/08/15 0938  BP: 119/62  111/79 130/85  Pulse: 79  140 72  Temp: 98 F (36.7 C)  97.9 F (36.6 C)   TempSrc: Oral  Oral   Resp: 18  18   Height:      Weight:  156 lb 1.4 oz (70.8 kg)    SpO2: 96%  98%     Intake/Output Summary (Last 24 hours) at 06/08/15 1048 Last data filed at 06/07/15 2223  Gross per 24 hour  Intake    480 ml  Output      0 ml  Net    480 ml   Filed Weights   06/06/15 1001 06/07/15 0503 06/08/15 0351  Weight: 159 lb 11.2 oz (72.439 kg) 164 lb 7.4 oz (74.6 kg) 156 lb 1.4 oz (70.8 kg)    PHYSICAL EXAM  General: Pleasant, NAD. Neuro: Alert and oriented X 3. Moves all extremities spontaneously. Psych: Normal affect. HEENT:  Normal  Neck: Supple without bruits or JVD. Lungs:  Resp regular and unlabored, CTA. Heart: Ir Ir with tachycardia no s3, s4, or murmurs. Abdomen: Soft, non-tender, non-distended, BS + x 4.  Extremities: No clubbing, cyanosis or edema. DP/PT/Radials 2+ and equal bilaterally.  Accessory Clinical Findings  CBC  Recent Labs  06/07/15 0526 06/08/15 0317  WBC 5.7 7.3  NEUTROABS 3.4 4.4  HGB 12.2 12.1  HCT 37.1 37.4  MCV 90.5 91.4  PLT 304 329   Basic Metabolic Panel  Recent Labs  06/05/15 1800  06/07/15 0526 06/08/15 0317  NA 140  < > 139 141  K 4.7  < > 3.7 3.9  CL 107  < > 105 103  CO2 25  < > 26 30  GLUCOSE 103*  < > 105* 96  BUN 13  < > 12 11  CREATININE 0.78  < > 0.83 0.78  CALCIUM 9.4  < > 9.0 9.5  MG 2.1  --   --   --   < > = values in this interval not displayed. Thyroid  Function Tests  Recent Labs  06/05/15 1946  TSH 0.016*    TELE  afib at rate of 130-150s  Radiology/Studies  Dg Chest [facility]  06/05/2015  CLINICAL DATA:  Atrial fibrillation.  COPD.  Former smoker. EXAM: PORTABLE CHEST 1 VIEW COMPARISON:  05/27/2014 FINDINGS: Mild cardiomegaly noted. Increased diffuse interstitial infiltrates are suspicious for mild interstitial edema. No evidence of pulmonary consolidation or pleural effusion. Bilateral shoulder prostheses again noted. IMPRESSION: Mild cardiomegaly and increased diffuse interstitial infiltrates, suspicious for mild edema. Electronically Signed   By: Myles Rosenthal M.D.   On: 06/05/2015 18:00    ASSESSMENT AND PLAN   1. Paroxysmal atrial fibrillation with RVR (HCC) - Rate elevated to 130-150s. Continue cardizem CD  and Toprol XL 12.5mg . BP has been stable. Consider one dose of IV metoprolol. For TEE/DCCV today at 2pm. Eliquis  BID for anticoagulation.    2.  Acute CHF (congestive heart failure) (HCC), unspecified - Likely  related to afib. CXR on admisson showed that there might be some intersitital edema and cardiomegaly s/p Iv lasix  x 2.  - Echo 06/06/15 showed LV LF of 50-55%, no WM abnormality, trivial pericardial effusion, mild dilated LA and RA.   3. Hypothyroidism - TSH now. Continue supplement. F/u with PCP.     4. COPD (chronic obstructive pulmonary disease) (HCC) - Not wheezing   5. Acute pulmonary edema (HCC) - Likely due to afib with RVR. Lungs clear.   Lorelei Pont PA-C Pager [phone]

## 2015-06-08 NOTE — Anesthesia Preprocedure Evaluation (Addendum)
Anesthesia Evaluation  Patient identified by MRN, date of birth, ID band Patient awake    Reviewed: Allergy & Precautions, H&P , NPO status , Patient's Chart, lab work & pertinent test results  History of Anesthesia Complications Negative for: history of anesthetic complications  Airway Mallampati: II  TM Distance: >3 FB Neck ROM: full    Dental  (+) Missing, Poor Dentition   Pulmonary COPD, former smoker,    + rhonchi        Cardiovascular +CHF  + dysrhythmias Atrial Fibrillation  Rhythm:Irregular Rate:Tachycardia  Echo 06/06/15 showed LV LF of 50-55%, no WM abnormality, trivial pericardial effusion, mild dilated LA and RA.     Neuro/Psych negative neurological ROS     GI/Hepatic negative GI ROS, Neg liver ROS,   Endo/Other  Hypothyroidism   Renal/GU negative Renal ROS     Musculoskeletal  (+) Arthritis ,   Abdominal   Peds  Hematology negative hematology ROS (+)   Anesthesia Other Findings   Reproductive/Obstetrics negative OB ROS                           Anesthesia Physical Anesthesia Plan  ASA: IV  Anesthesia Plan: MAC   Post-op Pain Management:    Induction: Intravenous  Airway Management Planned: Nasal Cannula  Additional Equipment:   Intra-op Plan:   Post-operative Plan:   Informed Consent: I have reviewed the patients History and Physical, chart, labs and discussed the procedure including the risks, benefits and alternatives for the proposed anesthesia with the patient or authorized representative who has indicated his/her understanding and acceptance.   Dental Advisory Given  Plan Discussed with: Anesthesiologist and CRNA  Anesthesia Plan Comments: (Patient currently in afib with RVR, recent heart failure due to decompensated arrythmia, needing TEE and cardioversion)      Anesthesia Quick Evaluation

## 2015-06-08 NOTE — Transfer of Care (Signed)
Immediate Anesthesia Transfer of Care Note  Patient: Katrina Velez  Procedure(s) Performed: Procedure(s): TRANSESOPHAGEAL ECHOCARDIOGRAM (TEE) (N/A) CARDIOVERSION (N/A)  Patient Location: Endoscopy Unit  Anesthesia Type:MAC  Level of Consciousness: awake, alert , oriented and patient cooperative  Airway & Oxygen Therapy: Patient Spontanous Breathing and Patient connected to nasal cannula oxygen  Post-op Assessment: Report given to RN, Post -op Vital signs reviewed and stable and Patient moving all extremities  Post vital signs: Reviewed and stable  Last Vitals:  Filed Vitals:   06/08/15 1250 06/08/15 1321  BP: 115/75 114/94  Pulse: 83 117  Temp: 36.4 C 36.8 C  Resp: 20 18    Complications: No apparent anesthesia complications

## 2015-06-08 NOTE — Anesthesia Postprocedure Evaluation (Signed)
Anesthesia Post Note  Patient: Katrina Velez  Procedure(s) Performed: Procedure(s) (LRB): TRANSESOPHAGEAL ECHOCARDIOGRAM (TEE) (N/A) CARDIOVERSION (N/A)  Patient location during evaluation: Endoscopy Anesthesia Type: MAC Level of consciousness: awake, awake and alert and patient cooperative Pain management: pain level controlled Vital Signs Assessment: post-procedure vital signs reviewed and stable Respiratory status: spontaneous breathing and patient connected to nasal cannula oxygen Cardiovascular status: stable Anesthetic complications: no    Last Vitals:  Filed Vitals:   06/08/15 1250 06/08/15 1321  BP: 115/75 114/94  Pulse: 83 117  Temp: 36.4 C 36.8 C  Resp: 20 18    Last Pain: There were no vitals filed for this visit.               Lunden Mcleish S.

## 2015-06-08 NOTE — CV Procedure (Signed)
    Electrical Cardioversion Procedure Note Katrina Velez [id number] Dec 31, 1925  Procedure: Electrical Cardioversion Indications:  Atrial Fibrillation  Time Out: Verified patient identification, verified procedure,medications/allergies/relevent history reviewed, required imaging and test results available.  Performed  Procedure Details  The patient was NPO after midnight. Anesthesia was administered at the beside  by Dr. Jean Rosenthal with propofol.  Cardioversion was performed with synchronized biphasic defibrillation via AP pads with 120 joules.  1 attempt(s) were performed.  The patient converted to normal sinus rhythm. The patient tolerated the procedure well   IMPRESSION:  Successful cardioversion of atrial fibrillation.  TEE with normal EF. No thrombus.    Katrina Velez 06/08/2015, 2:39 PM

## 2015-06-09 ENCOUNTER — Encounter (HOSPITAL_COMMUNITY): Payer: Self-pay | Admitting: Cardiology

## 2015-06-09 ENCOUNTER — Telehealth: Payer: Self-pay | Admitting: Internal Medicine

## 2015-06-09 NOTE — Telephone Encounter (Signed)
Patient contacted regarding discharge from [facility] on 06/08/2015.  Patient understands to follow up with provider Rudi Coco at the [facility] on 06/15/15 at 11:30am at [facility]. Patient understands discharge instructions? yes Patient understands medications and regiment? yes Patient understands to bring all medications to this visit? yes  Patient states she also has an appt 06/15/15 at 12:45pm at the [facility] location.  She was not aware of the appt at the [facility].  Gave phone # [phone] for the exact clinic location and to find out if she needs both appts same day.  She is not happy with seeing two providers with the same group same day.

## 2015-06-09 NOTE — Telephone Encounter (Signed)
D/C phone call . appt is on 06/15/15 at 11:30am w/ Rudi Coco at the [facility]..    Thanks

## 2015-06-15 ENCOUNTER — Encounter: Payer: Medicare Other | Admitting: Physician Assistant

## 2015-06-15 ENCOUNTER — Ambulatory Visit (HOSPITAL_COMMUNITY)
Admit: 2015-06-15 | Discharge: 2015-06-15 | Disposition: A | Discharge status: Home / Self Care | Payer: Medicare Other | Source: Ambulatory Visit | Attending: Nurse Practitioner | Admitting: Nurse Practitioner

## 2015-06-15 VITALS — BP 136/88 | HR 137 | Ht 63.0 in | Wt 157.8 lb

## 2015-06-15 DIAGNOSIS — Z7902 Long term (current) use of antithrombotics/antiplatelets: Secondary | ICD-10-CM | POA: Diagnosis not present

## 2015-06-15 DIAGNOSIS — M1990 Unspecified osteoarthritis, unspecified site: Secondary | ICD-10-CM | POA: Diagnosis not present

## 2015-06-15 DIAGNOSIS — J449 Chronic obstructive pulmonary disease, unspecified: Secondary | ICD-10-CM | POA: Diagnosis not present

## 2015-06-15 DIAGNOSIS — Z880 Allergy status to penicillin: Secondary | ICD-10-CM | POA: Diagnosis not present

## 2015-06-15 DIAGNOSIS — Z79899 Other long term (current) drug therapy: Secondary | ICD-10-CM | POA: Insufficient documentation

## 2015-06-15 DIAGNOSIS — I10 Essential (primary) hypertension: Secondary | ICD-10-CM | POA: Insufficient documentation

## 2015-06-15 DIAGNOSIS — I4891 Unspecified atrial fibrillation: Secondary | ICD-10-CM | POA: Diagnosis not present

## 2015-06-15 DIAGNOSIS — Z87891 Personal history of nicotine dependence: Secondary | ICD-10-CM | POA: Diagnosis not present

## 2015-06-15 DIAGNOSIS — E079 Disorder of thyroid, unspecified: Secondary | ICD-10-CM | POA: Insufficient documentation

## 2015-06-15 MED ORDER — METOPROLOL SUCCINATE ER 25 MG PO TB24: ORAL_TABLET | ORAL | Status: DC

## 2015-06-15 NOTE — Patient Instructions (Signed)
Your physician has recommended you make the following change in your medication:  1)Metoprolol -- take 1 tablet ( ) in the morning and a 1/2 tablet (12.5mg ) in the evening

## 2015-06-16 ENCOUNTER — Encounter (HOSPITAL_COMMUNITY): Payer: Self-pay | Admitting: Nurse Practitioner

## 2015-06-16 NOTE — Progress Notes (Signed)
Patient ID: Katrina Velez, female   DOB: [birth year], 82 y.o.   MRN: [id number]     Primary Care Physician: Lucilla Edin, MD Referring Physician: [facility] f/u   Katrina Velez is a 82 y.o. female with  no history of CAD and but has history of carpel tunnel syndrome, OA, HTN, COPD. Patient has been having the feeling that she has a cough but she could not cough. She was given a cough syrup in 02/2015. This continued on and she continued to have tired and fatigued. She went to the urgent care and was found to be afib RVR c and sent to ED 06/05/15 for further evaluation. She has been taking delsyn recently. She has been feeling SOB as well with these symptoms. She denied PND, orthopnea or leg swelling EMS gave 10 mg of IV cardiazem. CXR showed that there might be some intersitital edema and cardiomegaly.   The patient started on IV heparin initially and then transferred to Eliquis. CHADSVASC score of 4. Rate was controlled on cardizem gtt, however was difficult to control after starting po cardizem CD. Echo yesterday shows EF 50-55%, no WM abnormality, trivial pericardial effusion, mild dilated LA and RA. Rate was still going up with exertion despite increased cardizem dose. Added BB, however no improvement of rate. TEE with normal EF, no thrombus. S/p successful DCCV at 120J x 1. Maintained sinus rhythm. The patient has hypothyrodism and takes synthroid . TSH level was low at 0.016. Pharmacist Silvana Newness) recommended reducing dose. Will decrease dose to 150 mvq and f/u with PCP in 4-6 weeks.   She returns today in afib with rvr. She was unaware, possibly more short winded over last few days. Despite being in afib, she feels much improved than prior to hospitalization. No issues with blood thinner. Does not snore, minimal caffeine, no alcohol use. Has used a lot of asa, for arthralgias, prior to starting blood thinner and was told that that would be contraindicated with DOAC use. She uses a cane but is  steady on her feet.  Today, she denies symptoms of palpitations, chest pain, shortness of breath, orthopnea, PND, lower extremity edema, dizziness, presyncope, syncope, or neurologic sequela. The patient is tolerating medications without difficulties and is otherwise without complaint today.   Past Medical History  Diagnosis Date  . Arthritis   . Cataract   . Thyroid disease   . Murmur   . COPD (chronic obstructive pulmonary disease) [facility])    Past Surgical History  Procedure Laterality Date  . Eye surgery    . Knee surgery    . Wrist surgery      both wrists  . Right hip replacement    . Joint replacement    . Tee without cardioversion N/A 06/08/2015    Procedure: TRANSESOPHAGEAL ECHOCARDIOGRAM (TEE);  Surgeon: Jake Bathe, MD;  Location: [facility] ENDOSCOPY;  Service: Cardiovascular;  Laterality: N/A;  . Cardioversion N/A 06/08/2015    Procedure: CARDIOVERSION;  Surgeon: Jake Bathe, MD;  Location: [facility] ENDOSCOPY;  Service: Cardiovascular;  Laterality: N/A;    Current Outpatient Prescriptions  Medication Sig Dispense Refill  . apixaban (ELIQUIS) 5 MG TABS tablet Take 1 tablet (5 mg total) by mouth 2 (two) times daily. 60 tablet 6  . ascorbic acid (VITAMIN C) 500 MG tablet Take 1,000 mg by mouth daily.    Marland Kitchen diltiazem (CARDIZEM LA) 120 MG 24 hr tablet Take 2 tablets (240 mg total) by mouth daily. 30 tablet 6  . Diphenhydramine-PE-APAP (DELSYM  COUGH/COLD NIGHT TIME PO) Take 10 mLs by mouth 2 (two) times daily as needed (for cough).    Marland Kitchen HYDROcodone-homatropine (HYCODAN) 5-1.5 MG/5ML syrup Take 5 mLs by mouth every 8 (eight) hours as needed for cough. 120 mL 0  . lidocaine (XYLOCAINE) 2 % solution Use as directed 20 mLs in the mouth or throat as needed for mouth pain. (Patient taking differently: Use as directed 20 mLs in the mouth or throat as needed (for hand pain). ) 100 mL 0  . metoprolol succinate (TOPROL-XL) 25 MG 24 hr tablet Take 1 tablet by mouth ( ) in the morning and take 1/2  tablet (12.5mg ) in the evening 30 tablet 3  . Multiple Vitamin (MULTIVITAMIN) capsule Take 1 capsule by mouth daily.    . vitamin E 400 UNIT capsule Take 400 Units by mouth daily.    Marland Kitchen levothyroxine (SYNTHROID, LEVOTHROID) 150 MCG tablet Take 1 tablet (150 mcg total) by mouth daily before breakfast. "OFFICE VISIT NEEDED FOR REFILLS" (Patient not taking: Reported on 06/15/2015) 30 tablet 1   No current facility-administered medications for this encounter.    Allergies  Allergen Reactions  . Banana Nausea Only    All banana products also  . Penicillins Hives    Social History   Social History  . Marital Status: Widowed    Spouse Name: N/A  . Number of Children: N/A  . Years of Education: N/A   Occupational History  . Not on file.   Social History Main Topics  . Smoking status: Former Smoker    Types: E-cigarettes    Quit date: 05/28/2011  . Smokeless tobacco: Never Used  . Alcohol Use: 0.0 oz/week    0 Standard drinks or equivalent per week  . Drug Use: No  . Sexual Activity: Not on file   Other Topics Concern  . Not on file   Social History Narrative    No family history on file.  ROS- All systems are reviewed and negative except as per the HPI above  Physical Exam: Filed Vitals:   06/15/15 1141  BP: 136/88  Pulse: 137  Height:  (1.6 m)  Weight: 157 lb 12.8 oz (71.578 kg)    GEN- The patient is well appearing, alert and oriented x 3 today.   Head- normocephalic, atraumatic Eyes-  Sclera clear, conjunctiva pink Ears- hearing intact Oropharynx- clear Neck- supple, no JVP Lymph- no cervical lymphadenopathy Lungs- Clear to ausculation bilaterally, normal work of breathing Heart- Rapid,irregular rate and rhythm, no murmurs, rubs or gallops, PMI not laterally displaced GI- soft, NT, ND, + BS Extremities- no clubbing, cyanosis, or edema MS- no significant deformity or atrophy Skin- no rash or lesion Psych- euthymic mood, full affect Neuro- strength and  sensation are intact  EKG- Afib with rvr at 137 bpm, qrs int 74 ms, qtc 449 ms  Epic records reviewed  Echo- Left ventricle: The cavity size was normal. Wall thickness was normal. Systolic function was normal. The estimated ejection fraction was in the range of 50% to 55%. Wall motion was normal; there were no regional wall motion abnormalities. - Aortic valve: There was trivial regurgitation. - Mitral valve: Calcified annulus. There was mild regurgitation. - Left atrium: The atrium was mildly dilated. - Right atrium: The atrium was mildly dilated. - Tricuspid valve: There was moderate regurgitation. - Pericardium, extracardiac: A trivial pericardial effusion was identified.  Labs reviewed: CBC wnl, bmet unremarkable with creatinine at 0.78  Assessment and Plan:  1. New onset afib with  rvr with ERAF after cardioversion Pt is minimally symptomatic and did not feel significantly better in SR Discussed rate vrs rhyme control   Because of lack of symptoms and advanced age, will attempt rate control She also is not in favor of drugs with potential side effects to return SR Will increase metoprolol to 25 mg am and 1/2 tab pm Continue cardizem at 240 mg a day Continue eliquis, bleeding precautions discussed  F/u Friday for the results of increase in metoprolol  Lupita Leash C. [facility] [address] [place], [place] [address] [phone]

## 2015-06-18 ENCOUNTER — Encounter (HOSPITAL_COMMUNITY): Payer: Self-pay | Admitting: Nurse Practitioner

## 2015-06-18 ENCOUNTER — Ambulatory Visit (HOSPITAL_COMMUNITY)
Admission: RE | Admit: 2015-06-18 | Discharge: 2015-06-18 | Disposition: A | Discharge status: Home / Self Care | Payer: Medicare Other | Source: Ambulatory Visit | Attending: Nurse Practitioner | Admitting: Nurse Practitioner

## 2015-06-18 VITALS — BP 130/72 | HR 124 | Ht 63.0 in | Wt 160.0 lb

## 2015-06-18 DIAGNOSIS — Z7902 Long term (current) use of antithrombotics/antiplatelets: Secondary | ICD-10-CM | POA: Insufficient documentation

## 2015-06-18 DIAGNOSIS — Z880 Allergy status to penicillin: Secondary | ICD-10-CM | POA: Insufficient documentation

## 2015-06-18 DIAGNOSIS — E079 Disorder of thyroid, unspecified: Secondary | ICD-10-CM | POA: Insufficient documentation

## 2015-06-18 DIAGNOSIS — J449 Chronic obstructive pulmonary disease, unspecified: Secondary | ICD-10-CM | POA: Diagnosis not present

## 2015-06-18 DIAGNOSIS — I10 Essential (primary) hypertension: Secondary | ICD-10-CM | POA: Diagnosis not present

## 2015-06-18 DIAGNOSIS — Z79899 Other long term (current) drug therapy: Secondary | ICD-10-CM | POA: Diagnosis not present

## 2015-06-18 DIAGNOSIS — R5383 Other fatigue: Secondary | ICD-10-CM | POA: Diagnosis not present

## 2015-06-18 DIAGNOSIS — I4891 Unspecified atrial fibrillation: Secondary | ICD-10-CM | POA: Diagnosis not present

## 2015-06-18 DIAGNOSIS — Z6828 Body mass index (BMI) 28.0-28.9, adult: Secondary | ICD-10-CM | POA: Insufficient documentation

## 2015-06-18 DIAGNOSIS — Z87891 Personal history of nicotine dependence: Secondary | ICD-10-CM | POA: Insufficient documentation

## 2015-06-18 DIAGNOSIS — R635 Abnormal weight gain: Secondary | ICD-10-CM | POA: Diagnosis not present

## 2015-06-18 DIAGNOSIS — M1990 Unspecified osteoarthritis, unspecified site: Secondary | ICD-10-CM | POA: Diagnosis not present

## 2015-06-18 MED ORDER — FUROSEMIDE 20 MG PO TABS: ORAL_TABLET | ORAL | Status: DC

## 2015-06-18 MED ORDER — DILTIAZEM HCL ER COATED BEADS 120 MG PO TB24: ORAL_TABLET | ORAL | Status: DC

## 2015-06-18 MED ORDER — METOPROLOL SUCCINATE ER 25 MG PO TB24: ORAL_TABLET | ORAL | Status: DC

## 2015-06-18 NOTE — Progress Notes (Signed)
Patient ID: Katrina Velez, female   DOB: 1925-07-12, 82 y.o.   MRN: [id number]         Primary Care Physician: Katrina Edin, MD Referring Physician: [facility] f/u   [name] is a 82 y.o. female with  no history of CAD and but has history of carpel tunnel syndrome, OA, HTN, COPD. Patient has been having the feeling that she has a cough but she could not cough. She was given a cough syrup in 02/2015. This continued on and she continued to have tired and fatigued. She went to the urgent care and was found to be afib RVR c and sent to ED 06/05/15 for further evaluation. She has been taking delsyn recently. She has been feeling SOB as well with these symptoms. She denied PND, orthopnea or leg swelling EMS gave 10 mg of IV cardiazem. CXR showed that there might be some intersitital edema and cardiomegaly.   The patient started on IV heparin initially and then transferred to Eliquis. CHADSVASC score of 4. Rate was controlled on cardizem gtt, however was difficult to control after starting po cardizem CD. Echo yesterday shows EF 50-55%, no WM abnormality, trivial pericardial effusion, mild dilated LA and RA. Rate was still going up with exertion despite increased cardizem dose. Added BB, however no improvement of rate. TEE with normal EF, no thrombus. S/p successful DCCV at 120J x 1. Maintained sinus rhythm. The patient has hypothyrodism and takes synthroid . TSH level was low at 0.016. Pharmacist Silvana Newness) recommended reducing dose. Will decrease dose to 150 mvq and f/u with PCP in 4-6 weeks.   She presented to the afib 1/24  in afib with rvr. She was unaware, possibly more short winded over last few days. Despite being in afib, she feels much improved than prior to hospitalization. No issues with blood thinner. Does not snore, minimal caffeine, no alcohol use. Has used a lot of asa, for arthralgias, prior to starting blood thinner and was told that that would be contraindicated with DOAC use.  She uses a cane but is steady on her feet.  She returns to the afib clinic today after rate control med adjustment for RVR . Her heart rate is improved but still too fast. She continues to c/o fatigue. Her weight is up 3 lbs form first of the week. She did show some fluid overload on recent hospitalization and was diuresed.  Today, she denies symptoms of palpitations, chest pain, shortness of breath, orthopnea, PND, lower extremity edema, dizziness, presyncope, syncope, or neurologic sequela. The patient is tolerating medications without difficulties and is otherwise without complaint today.   Past Medical History  Diagnosis Date  . Arthritis   . Cataract   . Thyroid disease   . Murmur   . COPD (chronic obstructive pulmonary disease) [facility])    Past Surgical History  Procedure Laterality Date  . Eye surgery    . Knee surgery    . Wrist surgery      both wrists  . Right hip replacement    . Joint replacement    . Tee without cardioversion N/A 06/08/2015    Procedure: TRANSESOPHAGEAL ECHOCARDIOGRAM (TEE);  Surgeon: Jake Bathe, MD;  Location: [facility] ENDOSCOPY;  Service: Cardiovascular;  Laterality: N/A;  . Cardioversion N/A 06/08/2015    Procedure: CARDIOVERSION;  Surgeon: Jake Bathe, MD;  Location: [facility] ENDOSCOPY;  Service: Cardiovascular;  Laterality: N/A;    Current Outpatient Prescriptions  Medication Sig Dispense Refill  . apixaban (ELIQUIS) 5  MG TABS tablet Take 1 tablet (5 mg total) by mouth 2 (two) times daily. 60 tablet 6  . ascorbic acid (VITAMIN C) 500 MG tablet Take 1,000 mg by mouth daily.    Marland Kitchen diltiazem (CARDIZEM LA) 120 MG 24 hr tablet Take 2 tablets (240 mg total) by mouth daily. 30 tablet 6  . Diphenhydramine-PE-APAP (DELSYM COUGH/COLD NIGHT TIME PO) Take 10 mLs by mouth 2 (two) times daily as needed (for cough).    Marland Kitchen HYDROcodone-homatropine (HYCODAN) 5-1.5 MG/5ML syrup Take 5 mLs by mouth every 8 (eight) hours as needed for cough. 120 mL 0  . lidocaine (XYLOCAINE) 2 %  solution Use as directed 20 mLs in the mouth or throat as needed for mouth pain. (Patient taking differently: Use as directed 20 mLs in the mouth or throat as needed (for hand pain). ) 100 mL 0  . metoprolol succinate (TOPROL-XL) 25 MG 24 hr tablet Take 1 tablet by mouth ( ) in the morning and take 1/2 tablet (12.5mg ) in the evening 30 tablet 3  . Multiple Vitamin (MULTIVITAMIN) capsule Take 1 capsule by mouth daily.    . vitamin E 400 UNIT capsule Take 400 Units by mouth daily.    Marland Kitchen levothyroxine (SYNTHROID, LEVOTHROID) 150 MCG tablet Take 1 tablet (150 mcg total) by mouth daily before breakfast. "OFFICE VISIT NEEDED FOR REFILLS" (Patient not taking: Reported on 06/15/2015) 30 tablet 1   No current facility-administered medications for this encounter.    Allergies  Allergen Reactions  . Banana Nausea Only    All banana products also  . Penicillins Hives    Social History   Social History  . Marital Status: Widowed    Spouse Name: N/A  . Number of Children: N/A  . Years of Education: N/A   Occupational History  . Not on file.   Social History Main Topics  . Smoking status: Former Smoker    Types: E-cigarettes    Quit date: 05/28/2011  . Smokeless tobacco: Never Used  . Alcohol Use: 0.0 oz/week    0 Standard drinks or equivalent per week  . Drug Use: No  . Sexual Activity: Not on file   Other Topics Concern  . Not on file   Social History Narrative    No family history on file.  ROS- All systems are reviewed and negative except as per the HPI above  Physical Exam: Filed Vitals:   06/15/15 1141  BP: 136/88  Pulse: 137  Height:  (1.6 m)  Weight: 157 lb 12.8 oz (71.578 kg)    GEN- The patient is well appearing, alert and oriented x 3 today.   Head- normocephalic, atraumatic Eyes-  Sclera clear, conjunctiva pink Ears- hearing intact Oropharynx- clear Neck- supple, no JVP Lymph- no cervical lymphadenopathy Lungs- Clear to ausculation bilaterally, normal  work of breathing Heart- Rapid,irregular rate and rhythm, no murmurs, rubs or gallops, PMI not laterally displaced GI- soft, NT, ND, + BS Extremities- no clubbing, cyanosis, or edema MS- no significant deformity or atrophy Skin- no rash or lesion Psych- euthymic mood, full affect Neuro- strength and sensation are intact  EKG- Afib with rvr at 124 bpm, qrs int 76 ms, qtc 448 ms  Epic records reviewed  Echo- Left ventricle: The cavity size was normal. Wall thickness was normal. Systolic function was normal. The estimated ejection fraction was in the range of 50% to 55%. Wall motion was normal; there were no regional wall motion abnormalities. - Aortic valve: There was trivial regurgitation. -  Mitral valve: Calcified annulus. There was mild regurgitation. - Left atrium: The atrium was mildly dilated. - Right atrium: The atrium was mildly dilated. - Tricuspid valve: There was moderate regurgitation. - Pericardium, extracardiac: A trivial pericardial effusion was identified.  Labs reviewed: CBC wnl, bmet unremarkable with creatinine at 0.78  Assessment and Plan:  1. New onset afib with rvr with ERAF after cardioversion Pt is  symptomatic with fatigue and did not feel significantly better in SR, but for in for a short period of time Discussed rate vrs rhythm control   Because of advanced age, will attempt rate control for now, however may need rhythm control if I can not improve symptoms with getting her heart rate controlled  She also is not in favor of drugs with potential side effects to return SR Continue metoprolol at 25 mg am and 12.5 mg pm Increase cardizem to 240 mg am, add 120 mg pm  2. Weight gain of 3 lbs since Monday Will order lasix 20 mg to use one tablet sat/sun am and after that one tab x 2-3 days for weight gain as needed for increase of  3-5 lbs in a few days. She will start extra dose of cardizem on Monday pm after she takes diuretic this week end. Afraid  to do both as same time as I might cause hypotension. Continue eliquis, bleeding precautions discussed  F/u middle of next week for above adjustment of meds  Lupita Leash C. [facility] [address] [place], [place] [address] [phone]

## 2015-06-18 NOTE — Patient Instructions (Signed)
Your physician has recommended you make the following change in your medication:  1)Lasix  -- take 1 tablet Saturday and Sunday morning then as needed for more than 3lbs weight gain. 2)Increase cardizem on Monday to  in the morning and  in the evening.

## 2015-06-24 ENCOUNTER — Ambulatory Visit (HOSPITAL_COMMUNITY)
Admission: RE | Admit: 2015-06-24 | Discharge: 2015-06-24 | Disposition: A | Discharge status: Home / Self Care | Payer: Medicare Other | Source: Ambulatory Visit | Attending: Nurse Practitioner | Admitting: Nurse Practitioner

## 2015-06-24 ENCOUNTER — Encounter (HOSPITAL_COMMUNITY): Payer: Self-pay | Admitting: Nurse Practitioner

## 2015-06-24 VITALS — BP 120/68 | HR 101 | Ht 63.0 in | Wt 158.8 lb

## 2015-06-24 DIAGNOSIS — E079 Disorder of thyroid, unspecified: Secondary | ICD-10-CM | POA: Diagnosis not present

## 2015-06-24 DIAGNOSIS — I4891 Unspecified atrial fibrillation: Secondary | ICD-10-CM | POA: Insufficient documentation

## 2015-06-24 DIAGNOSIS — Z7902 Long term (current) use of antithrombotics/antiplatelets: Secondary | ICD-10-CM | POA: Diagnosis not present

## 2015-06-24 DIAGNOSIS — R635 Abnormal weight gain: Secondary | ICD-10-CM | POA: Diagnosis not present

## 2015-06-24 DIAGNOSIS — J449 Chronic obstructive pulmonary disease, unspecified: Secondary | ICD-10-CM | POA: Diagnosis not present

## 2015-06-24 DIAGNOSIS — Z87891 Personal history of nicotine dependence: Secondary | ICD-10-CM | POA: Diagnosis not present

## 2015-06-24 DIAGNOSIS — R5383 Other fatigue: Secondary | ICD-10-CM | POA: Insufficient documentation

## 2015-06-24 DIAGNOSIS — Z79899 Other long term (current) drug therapy: Secondary | ICD-10-CM | POA: Diagnosis not present

## 2015-06-24 DIAGNOSIS — Z880 Allergy status to penicillin: Secondary | ICD-10-CM | POA: Insufficient documentation

## 2015-06-24 DIAGNOSIS — Z6828 Body mass index (BMI) 28.0-28.9, adult: Secondary | ICD-10-CM | POA: Insufficient documentation

## 2015-06-24 NOTE — Progress Notes (Signed)
Patient ID: Katrina Velez, female   DOB: [birth year], 82 y.o.   MRN: [id number]         Primary Care Physician: Lucilla Edin, MD Referring Physician: [facility] f/u   Katrina Velez is a 82 y.o. female with  no history of CAD and but has history of carpel tunnel syndrome, OA, HTN, COPD. Patient has been having the feeling that she has a cough but she could not cough. She was given a cough syrup in 02/2015. This continued on and she continued to be tired and fatigued. She went to the urgent care and was found to be afib RVR c and sent to ED 06/05/15 for further evaluation. She has been taking delsyn recently. She has been feeling SOB as well with these symptoms. She denied PND, orthopnea or leg swelling EMS gave 10 mg of IV cardiazem. CXR showed that there might be some intersitital edema and cardiomegaly.   The patient started on IV heparin initially and then transferred to Eliquis. CHADSVASC score of 4. Rate was controlled on cardizem gtt, however was difficult to control after starting po cardizem CD. Echo yesterday shows EF 50-55%, no WM abnormality, trivial pericardial effusion, mild dilated LA and RA. Rate was still going up with exertion despite increased cardizem dose. Added BB, however no improvement of rate. TEE with normal EF, no thrombus. S/p successful DCCV at 120J x 1. Maintained sinus rhythm. The patient has hypothyrodism and takes synthroid . TSH level was low at 0.016. Pharmacist Silvana Newness) recommended reducing dose. Will decrease dose to 150 mvq and f/u with PCP in 4-6 weeks.   She presented to the afib 1/24  in afib with rvr. She was unaware, possibly more short winded over last few days. Despite being in afib, she feels much improved than prior to hospitalization. No issues with blood thinner. Does not snore, minimal caffeine, no alcohol use. Has used a lot of asa, for arthralgias, prior to starting blood thinner and was told that that would be contraindicated with DOAC use. She  uses a cane but is steady on her feet.  She returned to the afib clinic 1/27,  after rate control med adjustment for RVR . Her heart rate is improved but still too fast. She continues to c/o fatigue. Her weight is up 3 lbs from first of the week. She did show some fluid overload on recent hospitalization and was given lasix to use as needed.  She returns today after increase in cardizem dose, heart rate is better controlled around 100. She has used two lasix tablets and weight is down two lbs. She feels ok but does not feel great. Still with fatigue. Long discussion if she wanted to try rhythm control, I think amiodarone would be best option. She wants to think about this for awhile.  Today, she denies symptoms of palpitations, chest pain, shortness of breath, orthopnea, PND, lower extremity edema, dizziness, presyncope, syncope, or neurologic sequela. The patient is tolerating medications without difficulties and is otherwise without complaint today.   Past Medical History  Diagnosis Date  . Arthritis   . Cataract   . Thyroid disease   . Murmur   . COPD (chronic obstructive pulmonary disease) [facility])    Past Surgical History  Procedure Laterality Date  . Eye surgery    . Knee surgery    . Wrist surgery      both wrists  . Right hip replacement    . Joint replacement    . Rhae Hammock  without cardioversion N/A 06/08/2015    Procedure: TRANSESOPHAGEAL ECHOCARDIOGRAM (TEE);  Surgeon: Jake Bathe, MD;  Location: [facility] ENDOSCOPY;  Service: Cardiovascular;  Laterality: N/A;  . Cardioversion N/A 06/08/2015    Procedure: CARDIOVERSION;  Surgeon: Jake Bathe, MD;  Location: [facility] ENDOSCOPY;  Service: Cardiovascular;  Laterality: N/A;    Current Outpatient Prescriptions  Medication Sig Dispense Refill  . apixaban (ELIQUIS) 5 MG TABS tablet Take 1 tablet (5 mg total) by mouth 2 (two) times daily. 60 tablet 6  . ascorbic acid (VITAMIN C) 500 MG tablet Take 1,000 mg by mouth daily.    Marland Kitchen diltiazem (CARDIZEM  LA) 120 MG 24 hr tablet Take 2 tablets (240 mg total) by mouth daily. 30 tablet 6  . Diphenhydramine-PE-APAP (DELSYM COUGH/COLD NIGHT TIME PO) Take 10 mLs by mouth 2 (two) times daily as needed (for cough).    Marland Kitchen HYDROcodone-homatropine (HYCODAN) 5-1.5 MG/5ML syrup Take 5 mLs by mouth every 8 (eight) hours as needed for cough. 120 mL 0  . lidocaine (XYLOCAINE) 2 % solution Use as directed 20 mLs in the mouth or throat as needed for mouth pain. (Patient taking differently: Use as directed 20 mLs in the mouth or throat as needed (for hand pain). ) 100 mL 0  . metoprolol succinate (TOPROL-XL) 25 MG 24 hr tablet Take 1 tablet by mouth ( ) in the morning and take 1/2 tablet (12.5mg ) in the evening 30 tablet 3  . Multiple Vitamin (MULTIVITAMIN) capsule Take 1 capsule by mouth daily.    . vitamin E 400 UNIT capsule Take 400 Units by mouth daily.    Marland Kitchen levothyroxine (SYNTHROID, LEVOTHROID) 150 MCG tablet Take 1 tablet (150 mcg total) by mouth daily before breakfast. "OFFICE VISIT NEEDED FOR REFILLS" (Patient not taking: Reported on 06/15/2015) 30 tablet 1   No current facility-administered medications for this encounter.    Allergies  Allergen Reactions  . Banana Nausea Only    All banana products also  . Penicillins Hives    Social History   Social History  . Marital Status: Widowed    Spouse Name: N/A  . Number of Children: N/A  . Years of Education: N/A   Occupational History  . Not on file.   Social History Main Topics  . Smoking status: Former Smoker    Types: E-cigarettes    Quit date: 05/28/2011  . Smokeless tobacco: Never Used  . Alcohol Use: 0.0 oz/week    0 Standard drinks or equivalent per week  . Drug Use: No  . Sexual Activity: Not on file   Other Topics Concern  . Not on file   Social History Narrative    No family history on file.  ROS- All systems are reviewed and negative except as per the HPI above  Physical Exam: Filed Vitals:   06/15/15 1141  BP:  136/88  Pulse: 137  Height:  (1.6 m)  Weight: 157 lb 12.8 oz (71.578 kg)    GEN- The patient is well appearing, alert and oriented x 3 today.   Head- normocephalic, atraumatic Eyes-  Sclera clear, conjunctiva pink Ears- hearing intact Oropharynx- clear Neck- supple, no JVP Lymph- no cervical lymphadenopathy Lungs- Clear to ausculation bilaterally, normal work of breathing Heart- Rapid,irregular rate and rhythm, no murmurs, rubs or gallops, PMI not laterally displaced GI- soft, NT, ND, + BS Extremities- no clubbing, cyanosis, or edema MS- no significant deformity or atrophy Skin- no rash or lesion Psych- euthymic mood, full affect Neuro- strength and sensation  are intact  EKG- Afib with rvr at 124 bpm, qrs int 76 ms, qtc 448 ms  Epic records reviewed  Echo- Left ventricle: The cavity size was normal. Wall thickness was normal. Systolic function was normal. The estimated ejection fraction was in the range of 50% to 55%. Wall motion was normal; there were no regional wall motion abnormalities. - Aortic valve: There was trivial regurgitation. - Mitral valve: Calcified annulus. There was mild regurgitation. - Left atrium: The atrium was mildly dilated. - Right atrium: The atrium was mildly dilated. - Tricuspid valve: There was moderate regurgitation. - Pericardium, extracardiac: A trivial pericardial effusion was identified.  Labs reviewed: CBC wnl, bmet unremarkable with creatinine at 0.78  Assessment and Plan:  1. New onset afib with rvr with ERAF after cardioversion Pt is  symptomatic with fatigue and did not feel significantly better in SR, but for in for a short period of time Discussed rate vrs rhythm control   Because of advanced age, will attempt rate control for now, however may need rhythm control if I can not improve symptoms with getting her heart rate controlled  She also is not in favor of drugs with potential side effects to return SR Discussed at  length today amiodarone use, she wants to think about it. Continue metoprolol at 25 mg am and 12.5 mg pm Continue cardizem  240 mg am,  120 mg pm Continue apixaban  2. Weight gain  Weight down 2 lbs with lasix 20 mg x 2 pills Continue to watch weight and lasix 20 mg as needed Continue eliquis, bleeding precautions discussed  F/u  two weeks to further discuss rate vrs rhythm control Sooner if needed  Lupita Leash C. [facility] [address] [place], [place] [address] [phone]

## 2015-07-09 ENCOUNTER — Encounter (HOSPITAL_COMMUNITY): Payer: Self-pay | Admitting: Nurse Practitioner

## 2015-07-09 ENCOUNTER — Ambulatory Visit (HOSPITAL_COMMUNITY)
Admission: RE | Admit: 2015-07-09 | Discharge: 2015-07-09 | Disposition: A | Discharge status: Home / Self Care | Payer: Medicare Other | Source: Ambulatory Visit | Attending: Nurse Practitioner | Admitting: Nurse Practitioner

## 2015-07-09 VITALS — BP 132/86 | HR 85 | Ht 63.0 in | Wt 157.4 lb

## 2015-07-09 DIAGNOSIS — Z96641 Presence of right artificial hip joint: Secondary | ICD-10-CM | POA: Insufficient documentation

## 2015-07-09 DIAGNOSIS — I4891 Unspecified atrial fibrillation: Secondary | ICD-10-CM | POA: Insufficient documentation

## 2015-07-09 DIAGNOSIS — Z880 Allergy status to penicillin: Secondary | ICD-10-CM | POA: Diagnosis not present

## 2015-07-09 DIAGNOSIS — I4819 Other persistent atrial fibrillation: Secondary | ICD-10-CM

## 2015-07-09 DIAGNOSIS — I481 Persistent atrial fibrillation: Secondary | ICD-10-CM | POA: Diagnosis not present

## 2015-07-09 DIAGNOSIS — Z6827 Body mass index (BMI) 27.0-27.9, adult: Secondary | ICD-10-CM | POA: Insufficient documentation

## 2015-07-09 DIAGNOSIS — Z79899 Other long term (current) drug therapy: Secondary | ICD-10-CM | POA: Diagnosis not present

## 2015-07-09 DIAGNOSIS — I10 Essential (primary) hypertension: Secondary | ICD-10-CM | POA: Diagnosis not present

## 2015-07-09 DIAGNOSIS — M1990 Unspecified osteoarthritis, unspecified site: Secondary | ICD-10-CM | POA: Diagnosis not present

## 2015-07-09 DIAGNOSIS — R5383 Other fatigue: Secondary | ICD-10-CM | POA: Diagnosis not present

## 2015-07-09 DIAGNOSIS — J449 Chronic obstructive pulmonary disease, unspecified: Secondary | ICD-10-CM | POA: Diagnosis not present

## 2015-07-09 DIAGNOSIS — Z87891 Personal history of nicotine dependence: Secondary | ICD-10-CM | POA: Diagnosis not present

## 2015-07-09 DIAGNOSIS — Z7902 Long term (current) use of antithrombotics/antiplatelets: Secondary | ICD-10-CM | POA: Diagnosis not present

## 2015-07-09 DIAGNOSIS — E079 Disorder of thyroid, unspecified: Secondary | ICD-10-CM | POA: Insufficient documentation

## 2015-07-09 DIAGNOSIS — R635 Abnormal weight gain: Secondary | ICD-10-CM | POA: Diagnosis not present

## 2015-07-09 NOTE — Progress Notes (Signed)
Patient ID: Katrina Velez, female   DOB: 09-25-1925, 82 y.o.   MRN: [id number]         Primary Care Physician: Lucilla Edin, MD Referring Physician: [facility] f/u   [name] is a 82 y.o. female with  no history of CAD and but has history of carpel tunnel syndrome, OA, HTN, COPD. Patient has been having the feeling that she has a cough but she could not cough. She was given a cough syrup in 02/2015. This continued on and she continued to be tired and fatigued. She went to the urgent care and was found to be afib RVR c and sent to ED 06/05/15 for further evaluation. She has been taking delsyn recently. She has been feeling SOB as well with these symptoms. She denied PND, orthopnea or leg swelling EMS gave 10 mg of IV cardiazem. CXR showed that there might be some intersitital edema and cardiomegaly.   The patient started on IV heparin initially and then transferred to Eliquis. CHADSVASC score of 4. Rate was controlled on cardizem gtt, however was difficult to control after starting po cardizem CD. Echo yesterday shows EF 50-55%, no WM abnormality, trivial pericardial effusion, mild dilated LA and RA. Rate was still going up with exertion despite increased cardizem dose. Added BB, however no improvement of rate. TEE with normal EF, no thrombus. S/p successful DCCV at 120J x 1. Maintained sinus rhythm. The patient has hypothyrodism and takes synthroid . TSH level was low at 0.016. Pharmacist Silvana Newness) recommended reducing dose. Will decrease dose to 150 mvq and f/u with PCP in 4-6 weeks.   She presented to the afib 1/24  in afib with rvr. She was unaware, possibly more short winded over last few days. Despite being in afib, she feels much improved than prior to hospitalization. No issues with blood thinner. Does not snore, minimal caffeine, no alcohol use. Has used a lot of asa, for arthralgias, prior to starting blood thinner and was told that that would be contraindicated with DOAC use. She  uses a cane but is steady on her feet.  She returned to the afib clinic 1/27,  after rate control med adjustment for RVR . Her heart rate is improved but still too fast. She continues to c/o fatigue. Her weight is up 3 lbs from first of the week. She did show some fluid overload on recent hospitalization and was given lasix to use as needed.  She returned 2/2 after increase in cardizem dose, heart rate is better controlled around 100. She has used two lasix tablets and weight is down two lbs. She feels ok but does not feel great. Still with fatigue. Long discussion if she wanted to try rhythm control, I think amiodarone would be best option. She wants to think about this for awhile.  Returns today, 2/17. Heart rate is better controlled in the 80's now. Fluid status is stable, but still feels tired. Discussed with pt, daughter and son, options, staying  with rate contol or moving on to  rhythm control. Pt still is undecided if she wants to take additional meds with risks to stay in rhythm.  Today, she denies symptoms of palpitations, chest pain, shortness of breath, orthopnea, PND, lower extremity edema, dizziness, presyncope, syncope, or neurologic sequela. The patient is tolerating medications without difficulties and is otherwise without complaint today.   Past Medical History  Diagnosis Date  . Arthritis   . Cataract   . Thyroid disease   . Murmur   .  COPD (chronic obstructive pulmonary disease) [facility])    Past Surgical History  Procedure Laterality Date  . Eye surgery    . Knee surgery    . Wrist surgery      both wrists  . Right hip replacement    . Joint replacement    . Tee without cardioversion N/A 06/08/2015    Procedure: TRANSESOPHAGEAL ECHOCARDIOGRAM (TEE);  Surgeon: Jake Bathe, MD;  Location: [facility] ENDOSCOPY;  Service: Cardiovascular;  Laterality: N/A;  . Cardioversion N/A 06/08/2015    Procedure: CARDIOVERSION;  Surgeon: Jake Bathe, MD;  Location: [facility] ENDOSCOPY;  Service:  Cardiovascular;  Laterality: N/A;    Current Outpatient Prescriptions  Medication Sig Dispense Refill  . apixaban (ELIQUIS) 5 MG TABS tablet Take 1 tablet (5 mg total) by mouth 2 (two) times daily. 60 tablet 6  . ascorbic acid (VITAMIN C) 500 MG tablet Take 1,000 mg by mouth daily.    Marland Kitchen diltiazem (CARDIZEM LA) 120 MG 24 hr tablet Take 2 tablets (240 mg total) by mouth daily. 30 tablet 6  . Diphenhydramine-PE-APAP (DELSYM COUGH/COLD NIGHT TIME PO) Take 10 mLs by mouth 2 (two) times daily as needed (for cough).    Marland Kitchen HYDROcodone-homatropine (HYCODAN) 5-1.5 MG/5ML syrup Take 5 mLs by mouth every 8 (eight) hours as needed for cough. 120 mL 0  . lidocaine (XYLOCAINE) 2 % solution Use as directed 20 mLs in the mouth or throat as needed for mouth pain. (Patient taking differently: Use as directed 20 mLs in the mouth or throat as needed (for hand pain). ) 100 mL 0  . metoprolol succinate (TOPROL-XL) 25 MG 24 hr tablet Take 1 tablet by mouth ( ) in the morning and take 1/2 tablet (12.5mg ) in the evening 30 tablet 3  . Multiple Vitamin (MULTIVITAMIN) capsule Take 1 capsule by mouth daily.    . vitamin E 400 UNIT capsule Take 400 Units by mouth daily.    Marland Kitchen levothyroxine (SYNTHROID, LEVOTHROID) 150 MCG tablet Take 1 tablet (150 mcg total) by mouth daily before breakfast. "OFFICE VISIT NEEDED FOR REFILLS" (Patient not taking: Reported on 06/15/2015) 30 tablet 1   No current facility-administered medications for this encounter.    Allergies  Allergen Reactions  . Banana Nausea Only    All banana products also  . Penicillins Hives    Social History   Social History  . Marital Status: Widowed    Spouse Name: N/A  . Number of Children: N/A  . Years of Education: N/A   Occupational History  . Not on file.   Social History Main Topics  . Smoking status: Former Smoker    Types: E-cigarettes    Quit date: 05/28/2011  . Smokeless tobacco: Never Used  . Alcohol Use: 0.0 oz/week    0 Standard  drinks or equivalent per week  . Drug Use: No  . Sexual Activity: Not on file   Other Topics Concern  . Not on file   Social History Narrative    No family history on file.  ROS- All systems are reviewed and negative except as per the HPI above  Physical Exam: Filed Vitals:   06/15/15 1141  BP: 136/88  Pulse: 137  Height:  (1.6 m)  Weight: 157 lb 12.8 oz (71.578 kg)    GEN- The patient is well appearing, alert and oriented x 3 today.   Head- normocephalic, atraumatic Eyes-  Sclera clear, conjunctiva pink Ears- hearing intact Oropharynx- clear Neck- supple, no JVP Lymph- no cervical lymphadenopathy  Lungs- Clear to ausculation bilaterally, normal work of breathing Heart -irregular rate and rhythm, no murmurs, rubs or gallops, PMI not laterally displaced GI- soft, NT, ND, + BS Extremities- no clubbing, cyanosis, or edema MS- no significant deformity or atrophy Skin- no rash or lesion Psych- euthymic mood, full affect Neuro- strength and sensation are intact  EKG- Afib with rvr at 124 bpm, qrs int 76 ms, qtc 448 ms  Epic records reviewed  Echo- Left ventricle: The cavity size was normal. Wall thickness was normal. Systolic function was normal. The estimated ejection fraction was in the range of 50% to 55%. Wall motion was normal; there were no regional wall motion abnormalities. - Aortic valve: There was trivial regurgitation. - Mitral valve: Calcified annulus. There was mild regurgitation. - Left atrium: The atrium was mildly dilated. - Right atrium: The atrium was mildly dilated. - Tricuspid valve: There was moderate regurgitation. - Pericardium, extracardiac: A trivial pericardial effusion was identified.  Labs reviewed: CBC wnl, bmet unremarkable with creatinine at 0.78  Assessment and Plan:  1. New onset afib with rvr with ERAF after cardioversion Now with good rate control Pt is symptomatic with fatigue and did not feel significantly better  in SR, but was in for  a short period of time Discussed rate vrs rhythm control   She also is not in favor of drugs with potential side effects to return SR Discussed at length today amiodarone use, or thiksoyn use, she wants to think about it. Continue metoprolol at 25 mg am and 12.5 mg pm Continue cardizem  240 mg am,  120 mg pm Continue apixaban  2. Weight gain  Stable Has prn lasix if needed for 3-5 lb weight gain  Will f/u as to pt's decision to use rhythm control/continue rate control  Senai Kingsley C. [facility] [address] [place], [place] [address] [phone]

## 2015-07-15 ENCOUNTER — Telehealth (HOSPITAL_COMMUNITY): Payer: Self-pay | Admitting: Nurse Practitioner

## 2015-07-29 ENCOUNTER — Other Ambulatory Visit: Payer: Self-pay

## 2015-07-29 MED ORDER — LEVOTHYROXINE SODIUM 150 MCG PO TABS: 150.0000 ug | ORAL_TABLET | Freq: Every day | ORAL | Status: DC

## 2015-07-30 ENCOUNTER — Other Ambulatory Visit (HOSPITAL_COMMUNITY): Payer: Self-pay | Admitting: *Deleted

## 2015-07-30 MED ORDER — AMIODARONE HCL 200 MG PO TABS
200.0000 mg | ORAL_TABLET | Freq: Two times a day (BID) | ORAL | Status: DC
Start: 2015-07-30 — End: 2015-08-30

## 2015-07-30 MED ORDER — METOPROLOL SUCCINATE ER 25 MG PO TB24: 12.5000 mg | ORAL_TABLET | Freq: Two times a day (BID) | ORAL | Status: DC

## 2015-07-30 NOTE — Telephone Encounter (Signed)
I have been discussing with pt starting amiodarone to try to get her back in rhythm, and I have been staff messaging Dr. Cleta Albertsaub as to his thoughts re her thyroid disease and amiodarone dose. He feels that this will be ok. He says that pt has been on a large dose of synthroid over the years and she has been reluctant to change dose. He said if pt was willing to reduce thyroid dose, he thought amiodarone was reasonable. The pt states that she is  willing to do this. She has been instructed to make an appointment next week with him for reduction in dose.  I will call in amiodarone 200 mg bid and after one month  will then attempt cardioversion. She is to start drug on Monday and I will see her Friday at 10 am. She will reduce metoprolol to 1/2 tab am and pm with the start of amiodarone.

## 2015-07-31 ENCOUNTER — Other Ambulatory Visit (HOSPITAL_COMMUNITY): Payer: Self-pay | Admitting: Nurse Practitioner

## 2015-08-03 ENCOUNTER — Ambulatory Visit (INDEPENDENT_AMBULATORY_CARE_PROVIDER_SITE_OTHER): Payer: Medicare Other | Admitting: Emergency Medicine

## 2015-08-03 VITALS — BP 134/79 | HR 93 | Temp 97.4°F | Resp 16 | Ht 63.0 in | Wt 153.0 lb

## 2015-08-03 DIAGNOSIS — I4891 Unspecified atrial fibrillation: Secondary | ICD-10-CM

## 2015-08-03 DIAGNOSIS — E039 Hypothyroidism, unspecified: Secondary | ICD-10-CM

## 2015-08-03 LAB — THYROID PANEL WITH TSH
Free Thyroxine Index: 3.2 (ref 1.4–3.8)
T3 Uptake: 34 % (ref 22–35)
T4 TOTAL: 9.4 ug/dL (ref 4.5–12.0)
TSH: 0.03 m[IU]/L — AB

## 2015-08-03 NOTE — Progress Notes (Signed)
Patient ID: Katrina Velez, female   DOB: 1926-03-08, 82 y.o.   MRN: [id number]    By signing my name below, I, [name], attest that this documentation has been prepared under the direction and in the presence of Collene Gobble, MD Electronically Signed: Charline Bills, ED Scribe 08/20/2015 at 9:26 AM.  Chief Complaint:  Chief Complaint  Patient presents with  . Follow-up  . Tachycardia   HPI: Katrina Velez is a 82 y.o. female, with a h/o hypothyroidism and a-fib, who reports to [facility] today for a follow-up. Pt reports recent fatigue, SOB on exertion, difficulty sleeping. She is currently taking 175 mg Synthroid and started amiodarone yesterday for a-fib. Pt has seen an endocrinologist a while ago but states that she has not seen one since.   Past Medical History  Diagnosis Date  . Arthritis   . Cataract   . Thyroid disease   . Murmur   . COPD (chronic obstructive pulmonary disease) [facility])    Past Surgical History  Procedure Laterality Date  . Eye surgery    . Knee surgery    . Wrist surgery      both wrists  . Right hip replacement    . Joint replacement    . Tee without cardioversion N/A 06/08/2015    Procedure: TRANSESOPHAGEAL ECHOCARDIOGRAM (TEE);  Surgeon: Jake Bathe, MD;  Location: [facility] ENDOSCOPY;  Service: Cardiovascular;  Laterality: N/A;  . Cardioversion N/A 06/08/2015    Procedure: CARDIOVERSION;  Surgeon: Jake Bathe, MD;  Location: [facility] ENDOSCOPY;  Service: Cardiovascular;  Laterality: N/A;   Social History   Social History  . Marital Status: Widowed    Spouse Name: N/A  . Number of Children: N/A  . Years of Education: N/A   Social History Main Topics  . Smoking status: Former Smoker    Types: E-cigarettes    Quit date: 05/28/2011  . Smokeless tobacco: Never Used  . Alcohol Use: 0.0 oz/week    0 Standard drinks or equivalent per week  . Drug Use: No  . Sexual Activity: Not Asked   Other Topics Concern  . None   Social History Narrative   No family  history on file. Allergies  Allergen Reactions  . Banana Nausea Only    All banana products also  . Penicillins Hives   Prior to Admission medications   Medication Sig Start Date End Date Taking? Authorizing Provider  amiodarone (PACERONE) 200 MG tablet Take 1 tablet (200 mg total) by mouth 2 (two) times daily. 07/30/15  Yes Newman Nip, NP  apixaban (ELIQUIS) 5 MG TABS tablet Take 1 tablet (5 mg total) by mouth 2 (two) times daily. 06/08/15  Yes Bhavinkumar Bhagat, PA  ascorbic acid (VITAMIN C) 500 MG tablet Take 1,000 mg by mouth daily.   Yes Historical Provider, MD  diltiazem (CARDIZEM LA) 120 MG 24 hr tablet Take  by mouth in the morning and  in the evening. 06/18/15  Yes Newman Nip, NP  furosemide (LASIX) 20 MG tablet TAKE 1 TABLET DAILY AS NEEDED FOR WEIGHT GAIN >3LBS. 08/02/15  Yes Newman Nip, NP  levothyroxine (SYNTHROID, LEVOTHROID) 150 MCG tablet Take 1 tablet (150 mcg total) by mouth daily before breakfast. 07/29/15  Yes Collene Gobble, MD  metoprolol succinate (TOPROL-XL) 25 MG 24 hr tablet Take 0.5 tablets (12.5 mg total) by mouth 2 (two) times daily. 07/30/15  Yes Newman Nip, NP  Multiple Vitamin (MULTIVITAMIN) capsule Take 1 capsule by mouth daily.  Yes Historical Provider, MD  vitamin E 400 UNIT capsule Take 400 Units by mouth daily.   Yes Historical Provider, MD   ROS: The patient denies fevers, chills, night sweats, unintentional weight loss, chest pain, palpitations, wheezing, nausea, vomiting, abdominal pain, dysuria, hematuria, melena, numbness, weakness, or tingling. +fatigue, +SOB  All other systems have been reviewed and were otherwise negative with the exception of those mentioned in the HPI and as above.    PHYSICAL EXAM: Filed Vitals:   08/03/15 0912 08/03/15 0913  BP:    Pulse: 91 93  Temp:    Resp:     Body mass index is 27.11 kg/(m^2).  General: Alert, no acute distress HEENT:  Normocephalic, atraumatic, oropharynx patent. [facility] Cardiovascular: Irregular rhythm consistent with a-fib. Respiratory: Decreased breath sounds in the bases consistent with diagnosis of COPD.  Abdominal: No organomegaly, abdomen is soft and non-tender, positive bowel sounds. No masses. Musculoskeletal: Gait intact. No edema, tenderness Skin: No rashes. Neurologic: Facial musculature symmetric. Psychiatric: Patient acts appropriately throughout our interaction. Lymphatic: No cervical or submandibular lymphadenopathy  LABS:  EKG/XRAY:   [facility]  ASSESSMENT/PLAN: This is a complicated situation. Patient has atrial fibrillation and has been on a high dose of Synthroid. There are concerns about making this worse with the addition of amiodarone. Thyroid studies were done today and referral made to Dr. Elvera LennoxGherghe  for her input.I personally performed the services described in this documentation, which was scribed in my presence. The recorded information has been reviewed and is accurate.    Gross sideeffects, risk and benefits, and alternatives of medications d/w patient. Patient is aware that all medications have potential sideeffects and we are unable to predict every sideeffect or drug-drug interaction that may occur.  Lesle ChrisSteven Daleyssa Loiselle MD 08/20/2015 8:08 AM

## 2015-08-03 NOTE — Patient Instructions (Signed)
     IF you received an x-ray today, you will receive an invoice from [facility]. Please contact [facility] at [phone] with questions or concerns regarding your invoice.   IF you received labwork today, you will receive an invoice from [facility]. Please contact [facility] at [phone] with questions or concerns regarding your invoice.   Our billing staff will not be able to assist you with questions regarding bills from these companies.  You will be contacted with the lab results as soon as they are available. The fastest way to get your results is to activate your My Chart account. Instructions are located on the last page of this paperwork. If you have not heard from us regarding the results in 2 weeks, please contact this office.      

## 2015-08-04 ENCOUNTER — Other Ambulatory Visit: Payer: Self-pay | Admitting: Emergency Medicine

## 2015-08-04 DIAGNOSIS — E038 Other specified hypothyroidism: Secondary | ICD-10-CM

## 2015-08-04 MED ORDER — LEVOTHYROXINE SODIUM 125 MCG PO TABS: 125.0000 ug | ORAL_TABLET | Freq: Every day | ORAL | Status: DC

## 2015-08-06 ENCOUNTER — Encounter (HOSPITAL_COMMUNITY): Payer: Self-pay | Admitting: Nurse Practitioner

## 2015-08-06 ENCOUNTER — Ambulatory Visit (HOSPITAL_COMMUNITY)
Admission: RE | Admit: 2015-08-06 | Discharge: 2015-08-06 | Disposition: A | Discharge status: Home / Self Care | Payer: Medicare Other | Source: Ambulatory Visit | Attending: Nurse Practitioner | Admitting: Nurse Practitioner

## 2015-08-06 VITALS — BP 116/78 | HR 104 | Ht 63.0 in | Wt 155.6 lb

## 2015-08-06 DIAGNOSIS — Z7901 Long term (current) use of anticoagulants: Secondary | ICD-10-CM | POA: Insufficient documentation

## 2015-08-06 DIAGNOSIS — I481 Persistent atrial fibrillation: Secondary | ICD-10-CM | POA: Insufficient documentation

## 2015-08-06 DIAGNOSIS — E079 Disorder of thyroid, unspecified: Secondary | ICD-10-CM | POA: Diagnosis not present

## 2015-08-06 DIAGNOSIS — Z87891 Personal history of nicotine dependence: Secondary | ICD-10-CM | POA: Diagnosis not present

## 2015-08-06 DIAGNOSIS — Z880 Allergy status to penicillin: Secondary | ICD-10-CM | POA: Diagnosis not present

## 2015-08-06 DIAGNOSIS — I4891 Unspecified atrial fibrillation: Secondary | ICD-10-CM | POA: Diagnosis present

## 2015-08-06 DIAGNOSIS — I4819 Other persistent atrial fibrillation: Secondary | ICD-10-CM

## 2015-08-06 DIAGNOSIS — J449 Chronic obstructive pulmonary disease, unspecified: Secondary | ICD-10-CM | POA: Insufficient documentation

## 2015-08-06 DIAGNOSIS — Z79899 Other long term (current) drug therapy: Secondary | ICD-10-CM | POA: Insufficient documentation

## 2015-08-06 NOTE — Progress Notes (Signed)
Patient ID: Katrina Velez, female   DOB: 12/13/1925, 82 y.o.   MRN: [id number]     Primary Care Physician: Lucilla Edin, MD Referring Physician: [facility] f/u   Katrina Velez is a 82 y.o. female with a h/o persistent afib. On last visit, we discussed trying to restore SR. She was interested in using amiodarone but she was on high replacement synthroid. I discussed with her PCP, which pt saw on f/u this week and her thyroid dose was reduced and she was referred to endocrinology since her pcp is retiring. She has been on amiodarone load x 5 days now and she is tolerating. Plan is to have cardioverted in one month after sufficient load. She is taking DOAC without missed doses and was reminded not to miss any doses.  Today, she denies symptoms of palpitations, chest pain, shortness of breath, orthopnea, PND, lower extremity edema, dizziness, presyncope, syncope, or neurologic sequela. The patient is tolerating medications without difficulties and is otherwise without complaint today.   Past Medical History  Diagnosis Date  . Arthritis   . Cataract   . Thyroid disease   . Murmur   . COPD (chronic obstructive pulmonary disease) [facility])    Past Surgical History  Procedure Laterality Date  . Eye surgery    . Knee surgery    . Wrist surgery      both wrists  . Right hip replacement    . Joint replacement    . Tee without cardioversion N/A 06/08/2015    Procedure: TRANSESOPHAGEAL ECHOCARDIOGRAM (TEE);  Surgeon: Jake Bathe, MD;  Location: [facility] ENDOSCOPY;  Service: Cardiovascular;  Laterality: N/A;  . Cardioversion N/A 06/08/2015    Procedure: CARDIOVERSION;  Surgeon: Jake Bathe, MD;  Location: [facility] ENDOSCOPY;  Service: Cardiovascular;  Laterality: N/A;    Current Outpatient Prescriptions  Medication Sig Dispense Refill  . amiodarone (PACERONE) 200 MG tablet Take 1 tablet (200 mg total) by mouth 2 (two) times daily. 60 tablet 0  . apixaban (ELIQUIS) 5 MG TABS tablet Take 1 tablet (5 mg total) by mouth  2 (two) times daily. 60 tablet 6  . ascorbic acid (VITAMIN C) 500 MG tablet Take 1,000 mg by mouth daily.    Marland Kitchen diltiazem (CARDIZEM LA) 120 MG 24 hr tablet Take  by mouth in the morning and  in the evening. 90 tablet 6  . furosemide (LASIX) 20 MG tablet TAKE 1 TABLET DAILY AS NEEDED FOR WEIGHT GAIN >3LBS. 30 tablet 3  . levothyroxine (SYNTHROID, LEVOTHROID) 125 MCG tablet Take 1 tablet (125 mcg total) by mouth daily. 30 tablet 5  . metoprolol succinate (TOPROL-XL) 25 MG 24 hr tablet Take 0.5 tablets (12.5 mg total) by mouth 2 (two) times daily. 45 tablet 3  . Multiple Vitamin (MULTIVITAMIN) capsule Take 1 capsule by mouth daily.    . vitamin E 400 UNIT capsule Take 400 Units by mouth daily.     No current facility-administered medications for this encounter.    Allergies  Allergen Reactions  . Banana Nausea Only    All banana products also  . Penicillins Hives    Social History   Social History  . Marital Status: Widowed    Spouse Name: N/A  . Number of Children: N/A  . Years of Education: N/A   Occupational History  . Not on file.   Social History Main Topics  . Smoking status: Former Smoker    Types: E-cigarettes    Quit date: 05/28/2011  . Smokeless tobacco: Never  Used  . Alcohol Use: 0.0 oz/week    0 Standard drinks or equivalent per week  . Drug Use: No  . Sexual Activity: Not on file   Other Topics Concern  . Not on file   Social History Narrative    No family history on file.  ROS- All systems are reviewed and negative except as per the HPI above  Physical Exam: Filed Vitals:   08/06/15 1016  BP: 116/78  Pulse: 104  Height: 5\' 3"  (1.6 m)  Weight: 155 lb 9.6 oz (70.58 kg)    GEN- The patient is well appearing, alert and oriented x 3 today.   Head- normocephalic, atraumatic Eyes-  Sclera clear, conjunctiva pink Ears- hearing intact Oropharynx- clear Neck- supple, no JVP Lymph- no cervical lymphadenopathy Lungs- Clear to ausculation  bilaterally, normal work of breathing Heart- irregular rate and rhythm, no murmurs, rubs or gallops, PMI not laterally displaced GI- soft, NT, ND, + BS Extremities- no clubbing, cyanosis, or edema MS- no significant deformity or atrophy Skin- no rash or lesion Psych- euthymic mood, full affect Neuro- strength and sensation are intact  EKG- Afib with rvr at 104 bpm, qrs int 84 ms, qtc 441 ms Epic records reviewed  Assessment and Plan: 1. Persistent afib Started on amiodarone 200 mg bid load Plan on DCCV after one month Metoprolol reduced at initiation of amiodarone 25 mg 1/2 tab bid Continue cardizem Continue DOAC  reminded not to  miss any doses   F/u in 2 1/2 weeks  Lupita LeashDonna C. Matthew Folksarroll, ANP-C Afib Clinic [facility] [place] [address] [place], [place] [address] [phone]

## 2015-08-17 ENCOUNTER — Telehealth: Payer: Self-pay | Admitting: Internal Medicine

## 2015-08-24 NOTE — Telephone Encounter (Signed)
error 

## 2015-08-25 ENCOUNTER — Encounter (HOSPITAL_COMMUNITY): Payer: Self-pay | Admitting: Nurse Practitioner

## 2015-08-25 ENCOUNTER — Ambulatory Visit (HOSPITAL_COMMUNITY)
Admission: RE | Admit: 2015-08-25 | Discharge: 2015-08-25 | Disposition: A | Discharge status: Home / Self Care | Payer: Medicare Other | Source: Ambulatory Visit | Attending: Nurse Practitioner | Admitting: Nurse Practitioner

## 2015-08-25 VITALS — BP 148/84 | HR 105 | Ht 63.0 in | Wt 153.2 lb

## 2015-08-25 DIAGNOSIS — J449 Chronic obstructive pulmonary disease, unspecified: Secondary | ICD-10-CM | POA: Insufficient documentation

## 2015-08-25 DIAGNOSIS — I4819 Other persistent atrial fibrillation: Secondary | ICD-10-CM

## 2015-08-25 DIAGNOSIS — I481 Persistent atrial fibrillation: Secondary | ICD-10-CM | POA: Diagnosis not present

## 2015-08-25 DIAGNOSIS — Z880 Allergy status to penicillin: Secondary | ICD-10-CM | POA: Insufficient documentation

## 2015-08-25 DIAGNOSIS — Z79899 Other long term (current) drug therapy: Secondary | ICD-10-CM | POA: Insufficient documentation

## 2015-08-25 DIAGNOSIS — Z7901 Long term (current) use of anticoagulants: Secondary | ICD-10-CM | POA: Insufficient documentation

## 2015-08-25 DIAGNOSIS — Z87891 Personal history of nicotine dependence: Secondary | ICD-10-CM | POA: Diagnosis not present

## 2015-08-25 DIAGNOSIS — E079 Disorder of thyroid, unspecified: Secondary | ICD-10-CM | POA: Insufficient documentation

## 2015-08-25 LAB — BASIC METABOLIC PANEL
ANION GAP: 9 (ref 5–15)
BUN: 14 mg/dL (ref 6–20)
CALCIUM: 9.7 mg/dL (ref 8.9–10.3)
CO2: 29 mmol/L (ref 22–32)
CREATININE: 0.99 mg/dL (ref 0.44–1.00)
Chloride: 102 mmol/L (ref 101–111)
GFR calc Af Amer: 57 mL/min — ABNORMAL LOW (ref >60)
GFR, EST NON AFRICAN AMERICAN: 49 mL/min — AB (ref >60)
GLUCOSE: 97 mg/dL (ref 65–99)
Potassium: 4.2 mmol/L (ref 3.5–5.1)
Sodium: 140 mmol/L (ref 135–145)

## 2015-08-25 LAB — CBC
HCT: 44.8 % (ref 36.0–46.0)
Hemoglobin: 14.5 g/dL (ref 12.0–15.0)
MCH: 28.8 pg (ref 26.0–34.0)
MCHC: 32.4 g/dL (ref 30.0–36.0)
MCV: 89.1 fL (ref 78.0–100.0)
PLATELETS: 353 10*3/uL (ref 150–400)
RBC: 5.03 MIL/uL (ref 3.87–5.11)
RDW: 14.8 % (ref 11.5–15.5)
WBC: 6.3 10*3/uL (ref 4.0–10.5)

## 2015-08-25 NOTE — Patient Instructions (Signed)
Cardioversion scheduled for Thursday, April 13th  - Arrive at the [facility]trance and go to admitting at 12PM  -Do not eat or drink anything after midnight the night prior to your procedure.  - Take all your medication with a sip of water prior to arrival.  - You will not be able to drive home after your procedure.  Decrease Amiodarone to 200mg  once a day after the cardioversion.

## 2015-08-25 NOTE — Progress Notes (Signed)
Patient ID: Katrina Velez, female   DOB: 08/10/1925, 82 y.o.   MRN: [id number]     Primary Care Physician: DAUB, STEVE A, MD Referring Physician: [facility]    Katrina Velez is a 82 y.o. female with a h/o persistent afib that has undergone amiodarone loading x the last 3 weeks in preparation for cardioversion. She reports that she feels unchanged on amiodarone, tolerating well. Will get set up for cardioversion. Has not missed any doses of eliquis.  Today, she denies symptoms of palpitations, chest pain, shortness of breath, orthopnea, PND, mild swelling of rt lower extremity edema, dizziness, presyncope, syncope, or neurologic sequela. The patient is tolerating medications without difficulties and is otherwise without complaint today.   Past Medical History  Diagnosis Date  . Arthritis   . Cataract   . Thyroid disease   . Murmur   . COPD (chronic obstructive pulmonary disease) (HCC)    Past Surgical History  Procedure Laterality Date  . Eye surgery    . Knee surgery    . Wrist surgery      both wrists  . Right hip replacement    . Joint replacement    . Tee without cardioversion N/A 06/08/2015    Procedure: TRANSESOPHAGEAL ECHOCARDIOGRAM (TEE);  Surgeon: Mark C Skains, MD;  Location: [facility] ENDOSCOPY;  Service: Cardiovascular;  Laterality: N/A;  . Cardioversion N/A 06/08/2015    Procedure: CARDIOVERSION;  Surgeon: Mark C Skains, MD;  Location: [facility] ENDOSCOPY;  Service: Cardiovascular;  Laterality: N/A;    Current Outpatient Prescriptions  Medication Sig Dispense Refill  . amiodarone (PACERONE) 200 MG tablet Take 1 tablet (200 mg total) by mouth 2 (two) times daily. 60 tablet 0  . apixaban (ELIQUIS) 5 MG TABS tablet Take 1 tablet (5 mg total) by mouth 2 (two) times daily. 60 tablet 6  . ascorbic acid (VITAMIN C) 500 MG tablet Take 1,000 mg by mouth daily.    . diltiazem (CARDIZEM LA) 120 MG 24 hr tablet Take 240mg by mouth in the morning and 120mg in the evening. 90 tablet 6  . furosemide  (LASIX) 20 MG tablet TAKE 1 TABLET DAILY AS NEEDED FOR WEIGHT GAIN >3LBS. 30 tablet 3  . levothyroxine (SYNTHROID, LEVOTHROID) 125 MCG tablet Take 1 tablet (125 mcg total) by mouth daily. 30 tablet 5  . metoprolol succinate (TOPROL-XL) 25 MG 24 hr tablet Take 0.5 tablets (12.5 mg total) by mouth 2 (two) times daily. 45 tablet 3  . Multiple Vitamin (MULTIVITAMIN) capsule Take 1 capsule by mouth daily.    . vitamin E 400 UNIT capsule Take 400 Units by mouth daily.     No current facility-administered medications for this encounter.    Allergies  Allergen Reactions  . Banana Nausea Only    All banana products also  . Penicillins Hives    Social History   Social History  . Marital Status: Widowed    Spouse Name: N/A  . Number of Children: N/A  . Years of Education: N/A   Occupational History  . Not on file.   Social History Main Topics  . Smoking status: Former Smoker    Types: E-cigarettes    Quit date: 05/28/2011  . Smokeless tobacco: Never Used  . Alcohol Use: 0.0 oz/week    0 Standard drinks or equivalent per week  . Drug Use: No  . Sexual Activity: Not on file   Other Topics Concern  . Not on file   Social History Narrative    No family history   on file.  ROS- All systems are reviewed and negative except as per the HPI above  Physical Exam: Filed Vitals:   08/25/15 1055  BP: 148/84  Pulse: 105  Height: 5' 3" (1.6 m)  Weight: 153 lb 3.2 oz (69.491 kg)    GEN- The patient is well appearing, alert and oriented x 3 today.   Head- normocephalic, atraumatic Eyes-  Sclera clear, conjunctiva pink Ears- hearing intact Oropharynx- clear Neck- supple, no JVP Lymph- no cervical lymphadenopathy Lungs- Clear to ausculation bilaterally, normal work of breathing Heart- Irregular rate and rhythm, no murmurs, rubs or gallops, PMI not laterally displaced GI- soft, NT, ND, + BS Extremities- no clubbing, cyanosis, or edema MS- no significant deformity or atrophy Skin-  no rash or lesion Psych- euthymic mood, full affect Neuro- strength and sensation are intact  EKG- afib with v rate of 105 bpm, qrs int 86, qtc 393 ms Epic records reviewed  Assessment and Plan: 1.Persistent afib Has been loading on amiodarone x 3 weeks and will set up for next week to complete one month of loading.  After cardioversion will decrease amiodarone to 200 mg a day. Continue metoprolol and cardizem but after DCCV, depending on heart rate, may need adjustment of meds if bradycardic. States no missed doses of apixaban, stressed not to miss any  doses.   F/u one week after cardioversion  Katrina Velez C. Katrina Velez, ANP-C Afib Clinic [facility] [address] [place], [place] [address] [phone]  

## 2015-08-30 ENCOUNTER — Other Ambulatory Visit (HOSPITAL_COMMUNITY): Payer: Self-pay | Admitting: Nurse Practitioner

## 2015-09-01 NOTE — Anesthesia Preprocedure Evaluation (Addendum)
Anesthesia Evaluation  Patient identified by MRN, date of birth, ID band Patient awake    Reviewed: Allergy & Precautions, H&P , NPO status , Patient's Chart, lab work & pertinent test results  History of Anesthesia Complications Negative for: history of anesthetic complications  Airway Mallampati: II  TM Distance: >3 FB Neck ROM: full    Dental  (+) Missing, Edentulous Upper, Edentulous Lower   Pulmonary COPD, former smoker (quit 2013),    breath sounds clear to auscultation+ rhonchi        Cardiovascular +CHF  + dysrhythmias Atrial Fibrillation  Rhythm:Irregular Rate:Tachycardia  Echo 06/06/15 showed LV LF of 50-55%, no WM abnormality, trivial pericardial effusion, mild dilated LA and RA.     Neuro/Psych negative neurological ROS     GI/Hepatic negative GI ROS, Neg liver ROS,   Endo/Other  Hypothyroidism   Renal/GU negative Renal ROS     Musculoskeletal  (+) Arthritis ,   Abdominal   Peds  Hematology negative hematology ROS (+)   Anesthesia Other Findings   Reproductive/Obstetrics negative OB ROS                            Anesthesia Physical Anesthesia Plan  ASA: III  Anesthesia Plan: MAC   Post-op Pain Management:    Induction: Intravenous  Airway Management Planned: Mask and Nasal Cannula  Additional Equipment:   Intra-op Plan:   Post-operative Plan:   Informed Consent: I have reviewed the patients History and Physical, chart, labs and discussed the procedure including the risks, benefits and alternatives for the proposed anesthesia with the patient or authorized representative who has indicated his/her understanding and acceptance.     Plan Discussed with:   Anesthesia Plan Comments:         Anesthesia Quick Evaluation

## 2015-09-02 ENCOUNTER — Ambulatory Visit (HOSPITAL_COMMUNITY)
Admission: RE | Admit: 2015-09-02 | Discharge: 2015-09-02 | Disposition: A | Discharge status: Home / Self Care | Payer: Medicare Other | Source: Ambulatory Visit | Attending: Cardiovascular Disease | Admitting: Cardiovascular Disease

## 2015-09-02 ENCOUNTER — Ambulatory Visit (HOSPITAL_COMMUNITY): Payer: Medicare Other | Admitting: Anesthesiology

## 2015-09-02 ENCOUNTER — Encounter (HOSPITAL_COMMUNITY): Payer: Self-pay

## 2015-09-02 ENCOUNTER — Encounter (HOSPITAL_COMMUNITY)
Admission: RE | Disposition: A | Discharge status: Home / Self Care | Payer: Self-pay | Source: Ambulatory Visit | Attending: Cardiovascular Disease

## 2015-09-02 DIAGNOSIS — E039 Hypothyroidism, unspecified: Secondary | ICD-10-CM | POA: Insufficient documentation

## 2015-09-02 DIAGNOSIS — I509 Heart failure, unspecified: Secondary | ICD-10-CM | POA: Insufficient documentation

## 2015-09-02 DIAGNOSIS — I4891 Unspecified atrial fibrillation: Secondary | ICD-10-CM | POA: Diagnosis present

## 2015-09-02 DIAGNOSIS — Z96641 Presence of right artificial hip joint: Secondary | ICD-10-CM | POA: Insufficient documentation

## 2015-09-02 DIAGNOSIS — I481 Persistent atrial fibrillation: Secondary | ICD-10-CM | POA: Diagnosis not present

## 2015-09-02 DIAGNOSIS — Z7901 Long term (current) use of anticoagulants: Secondary | ICD-10-CM | POA: Diagnosis not present

## 2015-09-02 DIAGNOSIS — Z87891 Personal history of nicotine dependence: Secondary | ICD-10-CM | POA: Diagnosis not present

## 2015-09-02 DIAGNOSIS — J449 Chronic obstructive pulmonary disease, unspecified: Secondary | ICD-10-CM | POA: Diagnosis not present

## 2015-09-02 DIAGNOSIS — Z79899 Other long term (current) drug therapy: Secondary | ICD-10-CM | POA: Insufficient documentation

## 2015-09-02 HISTORY — PX: CARDIOVERSION: SHX1299

## 2015-09-02 SURGERY — CARDIOVERSION
Anesthesia: Monitor Anesthesia Care

## 2015-09-02 MED ORDER — SODIUM CHLORIDE 0.9 % IV SOLN
INTRAVENOUS | Status: DC
  Administered 2015-09-02: 13:00:00 via INTRAVENOUS

## 2015-09-02 MED ORDER — PROPOFOL 10 MG/ML IV BOLUS
INTRAVENOUS | Status: DC | PRN
  Administered 2015-09-02: 40 mg via INTRAVENOUS
  Administered 2015-09-02: 25 mg via INTRAVENOUS

## 2015-09-02 MED ORDER — LIDOCAINE HCL (CARDIAC) 20 MG/ML IV SOLN
INTRAVENOUS | Status: DC | PRN
  Administered 2015-09-02: 40 mg via INTRATRACHEAL

## 2015-09-02 MED ORDER — DILTIAZEM HCL ER COATED BEADS 120 MG PO TB24: ORAL_TABLET | ORAL | Status: DC

## 2015-09-02 MED ORDER — MEPERIDINE HCL 100 MG/ML IJ SOLN: 6.2500 mg | INTRAMUSCULAR | Status: DC | PRN

## 2015-09-02 NOTE — Interval H&P Note (Signed)
History and Physical Interval Note: No interval changes. Will proceed with DCCV as planned.  09/02/2015 10:37 AM  Katrina Velez  has presented today for surgery, with the diagnosis of AFIB  The various methods of treatment have been discussed with the patient and family. After consideration of risks, benefits and other options for treatment, the patient has consented to  Procedure(s): CARDIOVERSION (N/A) as a surgical intervention .  The patient's history has been reviewed, patient examined, no change in status, stable for surgery.  I have reviewed the patient's chart and labs.  Questions were answered to the patient's satisfaction.     Katrina Velez, [name]

## 2015-09-02 NOTE — Anesthesia Postprocedure Evaluation (Signed)
Anesthesia Post Note  Patient: Glorious PeachAlice Rahimi  Procedure(s) Performed: Procedure(s) (LRB): CARDIOVERSION (N/A)  Patient location during evaluation: Endoscopy Anesthesia Type: MAC Level of consciousness: awake and alert Pain management: pain level controlled Vital Signs Assessment: post-procedure vital signs reviewed and stable Respiratory status: spontaneous breathing, nonlabored ventilation, respiratory function stable and patient connected to nasal cannula oxygen Cardiovascular status: stable and blood pressure returned to baseline Anesthetic complications: no    Last Vitals:  Filed Vitals:   09/02/15 1320 09/02/15 1330  BP: 127/58 119/62  Pulse: 58 61  Resp: 16 17    Last Pain: There were no vitals filed for this visit.               Sebastian Acheheodore Leandro Berkowitz

## 2015-09-02 NOTE — Procedures (Signed)
Elective direct current cardioversion  Indication: Persistent atrial fibrillation  Description of procedure: After informed consent was obtained and preprocedure evaluation by Anesthesia, patient was taken to the procedure room. Timeout performed. Sedation was managed completely by the Anesthesia service, please refer to their documentation for details. Anterior and posterior chest pads were placed and connected to a biphasic defibrillator. A single synchronized shock of 120J was delivered resulting in restoration of sinus rhythm with PAC's. The patient remained hemodynamically stable throughout, and there were no immediate complications noted. Follow-up ECG obtained.  Prentice DockerSuresh Koneswaran, M.D., F.A.C.C.

## 2015-09-02 NOTE — H&P (View-Only) (Signed)
Patient ID: Katrina Velez, female   DOB: 08-17-1925, 82 y.o.   MRN: [id number]     Primary Care Physician: Lucilla Edin, MD Referring Physician: [facility] Lacina is a 82 y.o. female with a h/o persistent afib that has undergone amiodarone loading x the last 3 weeks in preparation for cardioversion. She reports that she feels unchanged on amiodarone, tolerating well. Will get set up for cardioversion. Has not missed any doses of eliquis.  Today, she denies symptoms of palpitations, chest pain, shortness of breath, orthopnea, PND, mild swelling of rt lower extremity edema, dizziness, presyncope, syncope, or neurologic sequela. The patient is tolerating medications without difficulties and is otherwise without complaint today.   Past Medical History  Diagnosis Date  . Arthritis   . Cataract   . Thyroid disease   . Murmur   . COPD (chronic obstructive pulmonary disease) [facility])    Past Surgical History  Procedure Laterality Date  . Eye surgery    . Knee surgery    . Wrist surgery      both wrists  . Right hip replacement    . Joint replacement    . Tee without cardioversion N/A 06/08/2015    Procedure: TRANSESOPHAGEAL ECHOCARDIOGRAM (TEE);  Surgeon: Jake Bathe, MD;  Location: [facility] ENDOSCOPY;  Service: Cardiovascular;  Laterality: N/A;  . Cardioversion N/A 06/08/2015    Procedure: CARDIOVERSION;  Surgeon: Jake Bathe, MD;  Location: [facility] ENDOSCOPY;  Service: Cardiovascular;  Laterality: N/A;    Current Outpatient Prescriptions  Medication Sig Dispense Refill  . amiodarone (PACERONE) 200 MG tablet Take 1 tablet (200 mg total) by mouth 2 (two) times daily. 60 tablet 0  . apixaban (ELIQUIS) 5 MG TABS tablet Take 1 tablet (5 mg total) by mouth 2 (two) times daily. 60 tablet 6  . ascorbic acid (VITAMIN C) 500 MG tablet Take 1,000 mg by mouth daily.    Marland Kitchen diltiazem (CARDIZEM LA) 120 MG 24 hr tablet Take  by mouth in the morning and  in the evening. 90 tablet 6  . furosemide  (LASIX) 20 MG tablet TAKE 1 TABLET DAILY AS NEEDED FOR WEIGHT GAIN >3LBS. 30 tablet 3  . levothyroxine (SYNTHROID, LEVOTHROID) 125 MCG tablet Take 1 tablet (125 mcg total) by mouth daily. 30 tablet 5  . metoprolol succinate (TOPROL-XL) 25 MG 24 hr tablet Take 0.5 tablets (12.5 mg total) by mouth 2 (two) times daily. 45 tablet 3  . Multiple Vitamin (MULTIVITAMIN) capsule Take 1 capsule by mouth daily.    . vitamin E 400 UNIT capsule Take 400 Units by mouth daily.     No current facility-administered medications for this encounter.    Allergies  Allergen Reactions  . Banana Nausea Only    All banana products also  . Penicillins Hives    Social History   Social History  . Marital Status: Widowed    Spouse Name: N/A  . Number of Children: N/A  . Years of Education: N/A   Occupational History  . Not on file.   Social History Main Topics  . Smoking status: Former Smoker    Types: E-cigarettes    Quit date: 05/28/2011  . Smokeless tobacco: Never Used  . Alcohol Use: 0.0 oz/week    0 Standard drinks or equivalent per week  . Drug Use: No  . Sexual Activity: Not on file   Other Topics Concern  . Not on file   Social History Narrative    No family history  on file.  ROS- All systems are reviewed and negative except as per the HPI above  Physical Exam: Filed Vitals:   08/25/15 1055  BP: 148/84  Pulse: 105  Height: 5\' 3"  (1.6 m)  Weight: 153 lb 3.2 oz (69.491 kg)    GEN- The patient is well appearing, alert and oriented x 3 today.   Head- normocephalic, atraumatic Eyes-  Sclera clear, conjunctiva pink Ears- hearing intact Oropharynx- clear Neck- supple, no JVP Lymph- no cervical lymphadenopathy Lungs- Clear to ausculation bilaterally, normal work of breathing Heart- Irregular rate and rhythm, no murmurs, rubs or gallops, PMI not laterally displaced GI- soft, NT, ND, + BS Extremities- no clubbing, cyanosis, or edema MS- no significant deformity or atrophy Skin-  no rash or lesion Psych- euthymic mood, full affect Neuro- strength and sensation are intact  EKG- afib with v rate of 105 bpm, qrs int 86, qtc 393 ms Epic records reviewed  Assessment and Plan: 1.Persistent afib Has been loading on amiodarone x 3 weeks and will set up for next week to complete one month of loading.  After cardioversion will decrease amiodarone to 200 mg a day. Continue metoprolol and cardizem but after DCCV, depending on heart rate, may need adjustment of meds if bradycardic. States no missed doses of apixaban, stressed not to miss any  doses.   F/u one week after cardioversion  Lupita LeashDonna C. Matthew Folksarroll, ANP-C Afib Clinic [facility] [place] [place] [address] [place], [place] [address] [phone]

## 2015-09-02 NOTE — Discharge Instructions (Signed)

## 2015-09-02 NOTE — Transfer of Care (Signed)
Immediate Anesthesia Transfer of Care Note  Patient: Katrina Velez  Procedure(s) Performed: Procedure(s): CARDIOVERSION (N/A)  Patient Location: Endoscopy Unit  Anesthesia Type:General  Level of Consciousness: awake, alert , oriented and patient cooperative  Airway & Oxygen Therapy: Patient Spontanous Breathing and Patient connected to nasal cannula oxygen  Post-op Assessment: Report given to RN and Post -op Vital signs reviewed and stable  Post vital signs: Reviewed and stable  Last Vitals:  Filed Vitals:   09/02/15 1219 09/02/15 1303  BP: 125/80 128/56  Pulse: 87 58  Resp: 17 16    Complications: No apparent anesthesia complications

## 2015-09-06 ENCOUNTER — Encounter (HOSPITAL_COMMUNITY): Payer: Self-pay | Admitting: Cardiovascular Disease

## 2015-09-09 ENCOUNTER — Encounter (HOSPITAL_COMMUNITY): Payer: Self-pay | Admitting: Nurse Practitioner

## 2015-09-09 ENCOUNTER — Ambulatory Visit (HOSPITAL_COMMUNITY)
Admission: RE | Admit: 2015-09-09 | Discharge: 2015-09-09 | Disposition: A | Discharge status: Home / Self Care | Payer: Medicare Other | Source: Ambulatory Visit | Attending: Nurse Practitioner | Admitting: Nurse Practitioner

## 2015-09-09 VITALS — BP 148/82 | HR 132 | Ht 63.0 in | Wt 154.4 lb

## 2015-09-09 DIAGNOSIS — M1990 Unspecified osteoarthritis, unspecified site: Secondary | ICD-10-CM | POA: Insufficient documentation

## 2015-09-09 DIAGNOSIS — Z79899 Other long term (current) drug therapy: Secondary | ICD-10-CM | POA: Insufficient documentation

## 2015-09-09 DIAGNOSIS — E079 Disorder of thyroid, unspecified: Secondary | ICD-10-CM | POA: Insufficient documentation

## 2015-09-09 DIAGNOSIS — Z880 Allergy status to penicillin: Secondary | ICD-10-CM | POA: Insufficient documentation

## 2015-09-09 DIAGNOSIS — J449 Chronic obstructive pulmonary disease, unspecified: Secondary | ICD-10-CM | POA: Insufficient documentation

## 2015-09-09 DIAGNOSIS — I481 Persistent atrial fibrillation: Secondary | ICD-10-CM | POA: Insufficient documentation

## 2015-09-09 DIAGNOSIS — I4891 Unspecified atrial fibrillation: Secondary | ICD-10-CM | POA: Diagnosis present

## 2015-09-09 DIAGNOSIS — Z7901 Long term (current) use of anticoagulants: Secondary | ICD-10-CM | POA: Insufficient documentation

## 2015-09-09 DIAGNOSIS — I4819 Other persistent atrial fibrillation: Secondary | ICD-10-CM

## 2015-09-09 DIAGNOSIS — Z87891 Personal history of nicotine dependence: Secondary | ICD-10-CM | POA: Insufficient documentation

## 2015-09-09 MED ORDER — AMIODARONE HCL 200 MG PO TABS: 200.0000 mg | ORAL_TABLET | Freq: Every day | ORAL | Status: DC

## 2015-09-09 MED ORDER — METOPROLOL SUCCINATE ER 25 MG PO TB24: ORAL_TABLET | ORAL | Status: DC

## 2015-09-09 NOTE — Progress Notes (Signed)
Patient ID: Katrina Velez, female   DOB: 13-Sep-1925, 82 y.o.   MRN: [id number]      Primary Care Physician: Lucilla Edin, MD Referring Physician: [facility] f/u   Katrina Velez is a 82 y.o. female with a h/o persistent afib, that had  failed one cardioversion prior to being seen initially in afib clinic.. On last visit, we discussed trying to restore SR. She was interested in using amiodarone but she was on high replacement synthroid. I discussed with her PCP, which pt saw on f/u this week and her thyroid dose was reduced and she was referred to endocrinology since her pcp is retiring. She was on amiodarone load  200 mg bid x one month and then cardioverted, 4/5. BB's were stopped at time of cardioversion due to SR with HR of 58. However on return to afib clinic, she has returned to afib and now has RVR since BB was stopped. She feels well however. She will be left in afib with rate control strategy , since she has failed cardioversion x2 and amiodarone as well. She is taking DOAC without missed doses and was reminded not to miss any doses.  Today, she denies symptoms of palpitations, chest pain, shortness of breath, orthopnea, PND, lower extremity edema, dizziness, presyncope, syncope, or neurologic sequela. The patient is tolerating medications without difficulties and is otherwise without complaint today.   Past Medical History  Diagnosis Date  . Arthritis   . Cataract   . Thyroid disease   . Murmur   . COPD (chronic obstructive pulmonary disease) [facility])    Past Surgical History  Procedure Laterality Date  . Eye surgery    . Knee surgery    . Wrist surgery      both wrists  . Right hip replacement    . Joint replacement    . Tee without cardioversion N/A 06/08/2015    Procedure: TRANSESOPHAGEAL ECHOCARDIOGRAM (TEE);  Surgeon: Jake Bathe, MD;  Location: [facility] ENDOSCOPY;  Service: Cardiovascular;  Laterality: N/A;  . Cardioversion N/A 06/08/2015    Procedure: CARDIOVERSION;  Surgeon: Jake Bathe, MD;  Location: [facility] ENDOSCOPY;  Service: Cardiovascular;  Laterality: N/A;    Current Outpatient Prescriptions  Medication Sig Dispense Refill  . amiodarone (PACERONE) 200 MG tablet Take 1 tablet (200 mg total) by mouth 2 (two) times daily. 60 tablet 0  . apixaban (ELIQUIS) 5 MG TABS tablet Take 1 tablet (5 mg total) by mouth 2 (two) times daily. 60 tablet 6  . ascorbic acid (VITAMIN C) 500 MG tablet Take 1,000 mg by mouth daily.    Marland Kitchen diltiazem (CARDIZEM LA) 120 MG 24 hr tablet Take  by mouth in the morning and  in the evening. 90 tablet 6  . furosemide (LASIX) 20 MG tablet TAKE 1 TABLET DAILY AS NEEDED FOR WEIGHT GAIN >3LBS. 30 tablet 3  . levothyroxine (SYNTHROID, LEVOTHROID) 125 MCG tablet Take 1 tablet (125 mcg total) by mouth daily. 30 tablet 5  . metoprolol succinate (TOPROL-XL) 25 MG 24 hr tablet Take 0.5 tablets (12.5 mg total) by mouth 2 (two) times daily. 45 tablet 3  . Multiple Vitamin (MULTIVITAMIN) capsule Take 1 capsule by mouth daily.    . vitamin E 400 UNIT capsule Take 400 Units by mouth daily.     No current facility-administered medications for this encounter.    Allergies  Allergen Reactions  . Banana Nausea Only    All banana products also  . Penicillins Hives    Social History  Social History  . Marital Status: Widowed    Spouse Name: N/A  . Number of Children: N/A  . Years of Education: N/A   Occupational History  . Not on file.   Social History Main Topics  . Smoking status: Former Smoker    Types: E-cigarettes    Quit date: 05/28/2011  . Smokeless tobacco: Never Used  . Alcohol Use: 0.0 oz/week    0 Standard drinks or equivalent per week  . Drug Use: No  . Sexual Activity: Not on file   Other Topics Concern  . Not on file   Social History Narrative    No family history on file.  ROS- All systems are reviewed and negative except as per the HPI above  Physical Exam: Filed Vitals:   08/06/15 1016  BP: 116/78  Pulse:  104  Height: 5\' 3"  (1.6 m)  Weight: 155 lb 9.6 oz (70.58 kg)    GEN- The patient is well appearing, alert and oriented x 3 today.   Head- normocephalic, atraumatic Eyes-  Sclera clear, conjunctiva pink Ears- hearing intact Oropharynx- clear Neck- supple, no JVP Lymph- no cervical lymphadenopathy Lungs- Clear to ausculation bilaterally, normal work of breathing Heart-rapid, irregular rate and rhythm, no murmurs, rubs or gallops, PMI not laterally displaced GI- soft, NT, ND, + BS Extremities- no clubbing, cyanosis, or edema MS- no significant deformity or atrophy Skin- no rash or lesion Psych- euthymic mood, full affect Neuro- strength and sensation are intact  EKG- Afib with rvr at 132 bpm, qrs 84 ms, Qtc 423 ms Epic records reviewed  Assessment and Plan: 1. Persistent afib Returned to SR with dccv, but early return of afib Will plan for rate control strategy Restart toprol 25 mg am and 1/2 tab pm Continue amiodarone for now, until rate controlled but will plan to wean off Continue cardizem Continue DOAC  reminded not to  miss any doses   F/u in 1 week for ekg  Elzora Cullins C. Matthew Folksarroll, ANP-C Afib Clinic [facility] [place] [address] [place], [place] [address] [phone]

## 2015-09-09 NOTE — Patient Instructions (Signed)
Your physician has recommended you make the following change in your medication:  1)Toprol 25mg  -- take 1 tablet in the morning and 1/2 tablet in the evening

## 2015-09-16 ENCOUNTER — Ambulatory Visit (HOSPITAL_COMMUNITY)
Admission: RE | Admit: 2015-09-16 | Discharge: 2015-09-16 | Disposition: A | Discharge status: Home / Self Care | Payer: Medicare Other | Source: Ambulatory Visit | Attending: Nurse Practitioner | Admitting: Nurse Practitioner

## 2015-09-16 DIAGNOSIS — I4892 Unspecified atrial flutter: Secondary | ICD-10-CM | POA: Insufficient documentation

## 2015-09-16 DIAGNOSIS — I4891 Unspecified atrial fibrillation: Secondary | ICD-10-CM | POA: Insufficient documentation

## 2015-09-16 DIAGNOSIS — I480 Paroxysmal atrial fibrillation: Secondary | ICD-10-CM | POA: Diagnosis not present

## 2015-09-16 MED ORDER — AMIODARONE HCL 200 MG PO TABS: 100.0000 mg | ORAL_TABLET | Freq: Every day | ORAL | Status: DC

## 2015-09-16 MED ORDER — METOPROLOL SUCCINATE ER 25 MG PO TB24: 25.0000 mg | ORAL_TABLET | Freq: Two times a day (BID) | ORAL | Status: DC

## 2015-09-16 NOTE — Progress Notes (Addendum)
Pt in for EKG only following metoprolol increase. Rudi Cocoonna Slyvester Latona NP to review  Pt returned in afib with HR over 100. Will increase BB and decrease amiodarone to 100 mg a day since she failed cardioversion with amio on board and hopefully will d/c if rate can be contolled.F/u in one week.

## 2015-09-16 NOTE — Patient Instructions (Signed)
Your physician has recommended you make the following change in your medication:  1)Increase metoprolol to 25mg  twice a day 2)Decrease amiodarone to 100mg  once a day ( 1/2 tablet)

## 2015-09-22 ENCOUNTER — Encounter (HOSPITAL_COMMUNITY): Payer: Self-pay | Admitting: Nurse Practitioner

## 2015-09-22 ENCOUNTER — Ambulatory Visit (HOSPITAL_COMMUNITY)
Admission: RE | Admit: 2015-09-22 | Discharge: 2015-09-22 | Disposition: A | Discharge status: Home / Self Care | Payer: Medicare Other | Source: Ambulatory Visit | Attending: Nurse Practitioner | Admitting: Nurse Practitioner

## 2015-09-22 DIAGNOSIS — I4891 Unspecified atrial fibrillation: Secondary | ICD-10-CM | POA: Diagnosis not present

## 2015-09-22 DIAGNOSIS — Z79899 Other long term (current) drug therapy: Secondary | ICD-10-CM | POA: Insufficient documentation

## 2015-09-22 DIAGNOSIS — I480 Paroxysmal atrial fibrillation: Secondary | ICD-10-CM

## 2015-09-22 NOTE — Progress Notes (Addendum)
Pt in for EKG/BP check following medication adjustments. Rudi Cocoonna Lavetta Geier NP to review EKG  Pt failed amiodarone and cardioversion and now aiming for rate control and weaning off amiodarone. HR is well rate controlled today. [place] is going [place] of [place] and will be back in a week, Will see back in two weeks and get off amiodarone and further adjust rate control. Pt feels well.

## 2015-10-01 ENCOUNTER — Ambulatory Visit: Payer: Medicare Other | Admitting: Internal Medicine

## 2015-10-13 ENCOUNTER — Ambulatory Visit (HOSPITAL_COMMUNITY)
Admission: RE | Admit: 2015-10-13 | Discharge: 2015-10-13 | Disposition: A | Discharge status: Home / Self Care | Payer: Medicare Other | Source: Ambulatory Visit | Attending: Nurse Practitioner | Admitting: Nurse Practitioner

## 2015-10-13 ENCOUNTER — Encounter (HOSPITAL_COMMUNITY): Payer: Self-pay | Admitting: Nurse Practitioner

## 2015-10-13 VITALS — BP 118/62 | HR 94 | Ht 63.0 in | Wt 155.2 lb

## 2015-10-13 DIAGNOSIS — Z87891 Personal history of nicotine dependence: Secondary | ICD-10-CM | POA: Insufficient documentation

## 2015-10-13 DIAGNOSIS — Z880 Allergy status to penicillin: Secondary | ICD-10-CM | POA: Diagnosis not present

## 2015-10-13 DIAGNOSIS — Z7901 Long term (current) use of anticoagulants: Secondary | ICD-10-CM | POA: Insufficient documentation

## 2015-10-13 DIAGNOSIS — Z79899 Other long term (current) drug therapy: Secondary | ICD-10-CM | POA: Insufficient documentation

## 2015-10-13 DIAGNOSIS — J449 Chronic obstructive pulmonary disease, unspecified: Secondary | ICD-10-CM | POA: Diagnosis not present

## 2015-10-13 DIAGNOSIS — E079 Disorder of thyroid, unspecified: Secondary | ICD-10-CM | POA: Insufficient documentation

## 2015-10-13 DIAGNOSIS — I4430 Unspecified atrioventricular block: Secondary | ICD-10-CM | POA: Insufficient documentation

## 2015-10-13 DIAGNOSIS — Z96641 Presence of right artificial hip joint: Secondary | ICD-10-CM | POA: Insufficient documentation

## 2015-10-13 DIAGNOSIS — I482 Chronic atrial fibrillation, unspecified: Secondary | ICD-10-CM

## 2015-10-13 DIAGNOSIS — I4892 Unspecified atrial flutter: Secondary | ICD-10-CM | POA: Insufficient documentation

## 2015-10-13 DIAGNOSIS — Z9889 Other specified postprocedural states: Secondary | ICD-10-CM | POA: Diagnosis not present

## 2015-10-13 DIAGNOSIS — Z91018 Allergy to other foods: Secondary | ICD-10-CM | POA: Insufficient documentation

## 2015-10-13 DIAGNOSIS — I4891 Unspecified atrial fibrillation: Secondary | ICD-10-CM | POA: Diagnosis present

## 2015-10-13 DIAGNOSIS — M1990 Unspecified osteoarthritis, unspecified site: Secondary | ICD-10-CM | POA: Insufficient documentation

## 2015-10-13 MED ORDER — METOPROLOL SUCCINATE ER 25 MG PO TB24: 25.0000 mg | ORAL_TABLET | Freq: Two times a day (BID) | ORAL | Status: DC

## 2015-10-13 NOTE — Progress Notes (Signed)
Patient ID: [place], female   DOB: [birth year], 82 y.o.   MRN: [id number]       Primary Care Physician: Lucilla EdinAUB, STEVE A, MD Referring Physician: Dr. Laqueta JeanAllred   Katrina Velez is a 82 y.o. female with a h/o afib that was loaded on amiodarone and successfully cardioverted 4/13, but with ERAF. She did not see that much difference in SR and had some thyroid concerns  already on replacement, so decision was mad to stop amiodarone and aim for rate control. Her amiodarone had been reduced to 100 mg on last visit, and HR has been staying below 100 at home. Her energy is good and she is back to hanging clothes outside. No issues with anticoagulant.  Today, she denies symptoms of palpitations, chest pain, shortness of breath, orthopnea, PND, lower extremity edema, dizziness, presyncope, syncope, or neurologic sequela. The patient is tolerating medications without difficulties and is otherwise without complaint today.   Past Medical History  Diagnosis Date  . Arthritis   . Cataract   . Thyroid disease   . Murmur   . COPD (chronic obstructive pulmonary disease) [facility](HCC)    Past Surgical History  Procedure Laterality Date  . Eye surgery    . Knee surgery    . Wrist surgery      both wrists  . Right hip replacement    . Joint replacement    . Tee without cardioversion N/A 06/08/2015    Procedure: TRANSESOPHAGEAL ECHOCARDIOGRAM (TEE);  Surgeon: Jake BatheMark C Skains, MD;  [facility];  Service: Cardiovascular;  Laterality: N/A;  . Cardioversion N/A 06/08/2015    Procedure: CARDIOVERSION;  Surgeon: Jake BatheMark C Skains, MD;  [facility];  Service: Cardiovascular;  Laterality: N/A;  . Cardioversion N/A 09/02/2015    Procedure: CARDIOVERSION;  Surgeon: Laqueta LindenSuresh A Koneswaran, MD;  [facility];  Service: Cardiovascular;  Laterality: N/A;    Current Outpatient Prescriptions  Medication Sig Dispense Refill  . apixaban (ELIQUIS) 5 MG TABS tablet Take 1 tablet (5 mg total) by mouth 2 (two)  times daily. 60 tablet 6  . ascorbic acid (VITAMIN C) 500 MG tablet Take 1,000 mg by mouth daily.    Marland Kitchen. diltiazem (CARDIZEM LA) 120 MG 24 hr tablet Take 120mg  by mouth every morning. 90 tablet 6  . furosemide (LASIX) 20 MG tablet TAKE 1 TABLET DAILY AS NEEDED FOR WEIGHT GAIN >3LBS. 30 tablet 3  . levothyroxine (SYNTHROID, LEVOTHROID) 125 MCG tablet Take 1 tablet (125 mcg total) by mouth daily. 30 tablet 5  . loratadine (CLARITIN) 10 MG tablet Take 10 mg by mouth daily.    . metoprolol succinate (TOPROL XL) 25 MG 24 hr tablet Take 1 tablet (25 mg total) by mouth 2 (two) times daily.    . Multiple Vitamin (MULTIVITAMIN) capsule Take 1 capsule by mouth daily.    . vitamin E 400 UNIT capsule Take 400 Units by mouth daily.     No current facility-administered medications for this encounter.    Allergies  Allergen Reactions  . Banana Nausea Only    All banana products also  . Penicillins Hives    Social History   Social History  . Marital Status: Widowed    Spouse Name: N/A  . Number of Children: N/A  . Years of Education: N/A   Occupational History  . Not on file.   Social History Main Topics  . Smoking status: Former Smoker    Types: E-cigarettes    Quit date: 05/28/2011  . Smokeless tobacco:  Never Used  . Alcohol Use: 0.0 oz/week    0 Standard drinks or equivalent per week  . Drug Use: No  . Sexual Activity: Not on file   Other Topics Concern  . Not on file   Social History Narrative    No family history on file.  ROS- All systems are reviewed and negative except as per the HPI above  Physical Exam: Filed Vitals:   10/13/15 1045  BP: 118/62  Pulse: 94  Height:  (1.6 m)  Weight: 155 lb 3.2 oz (70.398 kg)    GEN- The patient is well appearing, alert and oriented x 3 today.   Head- normocephalic, atraumatic Eyes-  Sclera clear, conjunctiva pink Ears- hearing intact Oropharynx- clear Neck- supple, no JVP Lymph- no cervical lymphadenopathy Lungs- Clear  to ausculation bilaterally, normal work of breathing Heart- Irregular rate and rhythm, no murmurs, rubs or gallops, PMI not laterally displaced GI- soft, NT, ND, + BS Extremities- no clubbing, cyanosis, or edema MS- no significant deformity or atrophy Skin- no rash or lesion Psych- euthymic mood, full affect Neuro- strength and sensation are intact  EKG-aflutter with variable AV block,  v rate at 94 bpm, qrs int 84 ms, qtc 442 ms Epic records reviewed  Assessment and Plan: 1.Chronic afib, minimally asymptomatic Stop amiodarone Increase metoprolol to 25 mg bid Continue to monitor heart rate/BP at home with the goal that the majority of heart rate readings are below 100 She will continue to monitor this at home and I will see back in 3 months Continue apixaban, report any bleeding issues   Lupita Leash C. Matthew Folks Afib Clinic [facility] [address] [place], [place] [address] [phone]

## 2015-10-13 NOTE — Patient Instructions (Signed)
Your physician has recommended you make the following change in your medication:  1)Stop Amiodarone 2)Increase metoprolol to 25mg  twice a day

## 2015-10-15 ENCOUNTER — Encounter: Payer: Self-pay | Admitting: Nurse Practitioner

## 2015-10-19 DIAGNOSIS — L602 Onychogryphosis: Secondary | ICD-10-CM | POA: Diagnosis not present

## 2015-11-15 ENCOUNTER — Ambulatory Visit (INDEPENDENT_AMBULATORY_CARE_PROVIDER_SITE_OTHER): Payer: Medicare Other | Admitting: Internal Medicine

## 2015-11-15 VITALS — BP 124/82 | HR 86 | Ht 63.0 in | Wt 157.0 lb

## 2015-11-15 DIAGNOSIS — E038 Other specified hypothyroidism: Secondary | ICD-10-CM

## 2015-11-15 DIAGNOSIS — E039 Hypothyroidism, unspecified: Secondary | ICD-10-CM | POA: Diagnosis not present

## 2015-11-15 LAB — TSH: TSH: 0.2 u[IU]/mL — ABNORMAL LOW (ref 0.35–4.50)

## 2015-11-15 LAB — T4, FREE: Free T4: 1.69 ng/dL — ABNORMAL HIGH (ref 0.60–1.60)

## 2015-11-15 NOTE — Patient Instructions (Signed)
Please stop at the lab.  For now, continue Levothyroxine 125 mcg daily.  Take the thyroid hormone every day, with water, at least 30 minutes before breakfast, separated by at least 4 hours from: - acid reflux medications - calcium - iron - multivitamins  Please return in 4 months.

## 2015-11-15 NOTE — Progress Notes (Signed)
Patient ID: Katrina Velez, female   DOB: 02/09/1926, 82 y.o.   MRN: [id number]   HPI  Katrina Velez is a 82 y.o.-year-old female, referred by her PCP, Dr. Cleta Alberts, for management of uncontrolled hypothyroidism.  Pt. has been dx with hypothyroidism in 1960s >> previously on Armour (did not feel well on this), then Levothyroxine (did not work), now Synthroid 175 >> 125 mcg (dose decreased 05/2015).  She takes the thyroid hormone: - at different times of the day, sometimes before, sometimes after meals! - with water - separated by >30 min from b'fast  - no calcium, iron, PPIs, multivitamins   I reviewed pt's thyroid tests: Lab Results  Component Value Date   TSH 0.03* 08/03/2015   TSH 0.016* 06/05/2015   TSH 0.014* 05/27/2014   TSH 0.012* 05/04/2013   TSH 0.014* 03/25/2012   FREET4 1.48 05/27/2014   FREET4 1.51 05/04/2013    She tells me that she has seen many doctors for her thyroid dysfunction and all of them have told her to decrease the dose of Synthroid. She would not want to do this since she always felt good on the 175 mcg Synthroid and now feels  underreplaced on the lower dose (despite the long-standing suppressed TSH).   Pt describes: - + fatigue - + poor sleep - no weight gain or loss - no heat/cold intolerance - no depression/anxiety - no tremors - no palpitations - no constipation/hyperdefecation - no dry skin - + hair loss  Pt. also has a history of A fib >> started on Amiodarone >> stopped 10/13/2015.   Pt denies feeling nodules in neck, hoarseness, dysphagia/odynophagia, SOB with lying down. Has to clear her throat.  She has FH of thyroid disorders in daughter. No FH of thyroid cancer.  No h/o radiation tx to head or neck. No recent use of iodine supplements.  ROS: Constitutional: see HPI Eyes: no blurry vision, no xerophthalmia ENT: no sore throat, no nodules palpated in throat, no dysphagia/odynophagia, no hoarseness, + hypoacusis Cardiovascular: no  CP/SOB/palpitations/leg swelling Respiratory: no cough/SOB Gastrointestinal: no N/V/D/C Musculoskeletal: no muscle/+ joint aches Skin: no rashes, + easy bruising Neurological: no tremors/numbness/tingling/dizziness Psychiatric: no depression/anxiety  Past Medical History  Diagnosis Date  . Arthritis   . Cataract   . Thyroid disease   . COPD (chronic obstructive pulmonary disease) (HCC)   . Permanent atrial fibrillation [facility])    Past Surgical History  Procedure Laterality Date  . Eye surgery    . Knee surgery    . Wrist surgery      both wrists  . Right hip replacement    . Joint replacement    . Tee without cardioversion N/A 06/08/2015    Procedure: TRANSESOPHAGEAL ECHOCARDIOGRAM (TEE);  Surgeon: Jake Bathe, MD;  Location[facility] ENDOSCOPY;  Service: Cardiovascular;  Laterality: N/A;  . Cardioversion N/A 06/08/2015    Procedure: CARDIOVERSION;  Surgeon: Jake Bathe, MD;  Location: [facility] ENDOSCOPY;  Service: Cardiovascular;  Laterality: N/A;  . Cardioversion N/A 09/02/2015    Procedure: CARDIOVERSION;  Surgeon: Laqueta Linden, MD;  Location: [facility] ENDOSCOPY;  Service: Cardiovascular;  Laterality: N/A;   Social History   Social History  . Marital Status: Widowed    Spouse Name: N/A  . Number of Children: 3   Occupational History  . N/A   Social History Main Topics  . Smoking status: Former Smoker    Types: E-cigarettes    Quit date: 05/28/2011  . Smokeless tobacco: Never Used  . Alcohol Use:  0.0 oz/week    0 Standard drinks or equivalent per week  . Drug Use: No   Current Outpatient Prescriptions on File Prior to Visit  Medication Sig Dispense Refill  . apixaban (ELIQUIS) 5 MG TABS tablet Take 1 tablet (5 mg total) by mouth 2 (two) times daily. 60 tablet 6  . ascorbic acid (VITAMIN C) 500 MG tablet Take 1,000 mg by mouth daily.    Marland Kitchen. diltiazem (CARDIZEM LA) 120 MG 24 hr tablet Take 120mg  by mouth every morning. 90 tablet 6  . furosemide (LASIX) 20 MG tablet TAKE 1  TABLET DAILY AS NEEDED FOR WEIGHT GAIN >3LBS. 30 tablet 3  . levothyroxine (SYNTHROID, LEVOTHROID) 125 MCG tablet Take 1 tablet (125 mcg total) by mouth daily. 30 tablet 5  . loratadine (CLARITIN) 10 MG tablet Take 10 mg by mouth daily.    . metoprolol succinate (TOPROL XL) 25 MG 24 hr tablet Take 1 tablet (25 mg total) by mouth 2 (two) times daily.    . Multiple Vitamin (MULTIVITAMIN) capsule Take 1 capsule by mouth daily.    . vitamin E 400 UNIT capsule Take 400 Units by mouth daily.     No current facility-administered medications on file prior to visit.   Allergies  Allergen Reactions  . Banana Nausea Only    All banana products also  . Penicillins Hives   FH: - HTN in 3 children - thyroid ds in daughter  PE: BP 124/82 mmHg  Pulse 86  Ht 5\' 3"  (1.6 m)  Wt 157 lb (71.215 kg)  BMI 27.82 kg/m2  SpO2 99% Body mass index is 27.82 kg/(m^2).  Wt Readings from Last 3 Encounters:  11/15/15 157 lb (71.215 kg)  10/13/15 155 lb 3.2 oz (70.398 kg)  09/09/15 154 lb 6.4 oz (70.035 kg)   Constitutional: overweight, in NAD Eyes: PERRLA, EOMI, no exophthalmos ENT: moist mucous membranes, no thyromegaly, no cervical lymphadenopathy Cardiovascular: RRR, No MRG Respiratory: CTA B Gastrointestinal: abdomen soft, NT, ND, BS+ Musculoskeletal: no deformities, strength intact in all 4 Skin: moist, warm, no rashes Neurological: no tremor with outstretched hands, DTR normal in all 4  ASSESSMENT: 1. Hypothyroidism - long h/o oversupplementation based on persistent fatigue  PLAN:  1. Patient with long-standing hypothyroidism, on levothyroxine DAW therapy. We reviewed her TFTs dating back from 2013 together and discussed about the fact that her values are usually almost 30 times lower than the LLN. The last TSH obtained after decreasing the change in dose by cardiology (from 175 mcg to 125 mcg) was only slightly better, which makes me wonder if she was completely compliant with the dose... We  discussed about possible risks of LT4 overreplacement to include arrhythmia (she has A fib), osteoporosis (I do not have DEXAs to review but she did have a hip fx in 2014), hypercoagulability. She mentions that she believes that the TSH value is just a number and we also need to consider how she feels when she decreases the dose. I acknowledge that she may feel poorly for a period of time after TFTs normalize as she has been oversuppressed for a long time, but we also need to consider that there may also be other things going on: vitamin deficiencies, deconditioning, etc. - she does not appear to have a goiter, thyroid nodules, or neck compression symptoms - We discussed about correct intake of levothyroxine, fasting, with water, separated by at least 30 minutes from breakfast, and separated by more than 4 hours from calcium, iron, multivitamins, acid  reflux medications (PPIs).She is not taking the medicine correctly - takes Synthroid at different times of the day. I advised her that the first thing needed in managing her hypothyroidism is not getting the right dose but taking the med correctlty. Only afterwards we can adjust the dose. She reluctantly agrees to move it in am. - will check thyroid tests today: TSH, free T4 today.  - If labs today are abnormal, she will need to return in 6 weeks for repeat labs - Otherwise, I will see her back in 4 months  Carlus Pavlovristina Damarian Priola, MD PhD [facility]  Needs refills  - 90 days.  Office Visit on 11/15/2015  Component Date Value Ref Range Status  . Free T4 11/15/2015 1.69* 0.60 - 1.60 ng/dL Final  . TSH 16/10/960406/26/2017 0.20* 0.35 - 4.50 uIU/mL Final   TSH improved but still below the LLN. Will decrease the dose of LT4 further to 112 mcg daily. Repeat TFTs in 6 weeks.

## 2015-11-16 ENCOUNTER — Encounter: Payer: Self-pay | Admitting: Internal Medicine

## 2015-11-16 MED ORDER — SYNTHROID 112 MCG PO TABS: 112.0000 ug | ORAL_TABLET | Freq: Every day | ORAL | Status: DC

## 2015-11-30 ENCOUNTER — Telehealth: Payer: Self-pay | Admitting: Internal Medicine

## 2015-11-30 ENCOUNTER — Other Ambulatory Visit: Payer: Self-pay

## 2015-11-30 ENCOUNTER — Telehealth: Payer: Self-pay

## 2015-11-30 MED ORDER — LEVOTHYROXINE SODIUM 125 MCG PO TABS: 125.0000 ug | ORAL_TABLET | Freq: Every day | ORAL | Status: DC

## 2015-11-30 NOTE — Telephone Encounter (Signed)
Ok, no pb. But we need to wait until the end of the mo (at least 4 weeks since the last check).

## 2015-11-30 NOTE — Telephone Encounter (Signed)
OK, with labs at the end of the month.

## 2015-11-30 NOTE — Telephone Encounter (Signed)
Synthroid medication 125 mcg sent into pharmacy per patient request to be placed back on medication, Per MD okay to do so. Patient has lab appointment 12/17/15. Will recheck then.

## 2015-11-30 NOTE — Telephone Encounter (Signed)
Patient states she does not like the 112 dosage of the synthroid. Asked if she can go back to her original dosage of the 125 mcg dose. She made a lab appointment for 12/17/15 to check, but states she is not feeling well at all with the new dosage. Please advise. Thank you!

## 2015-11-30 NOTE — Telephone Encounter (Signed)
The decreased synthryroid dosing to the .124 has caused her some side effects she is not feeling well hair is falling out, depressed, fatigue. She wants to know if she can come in for blood work before we continue to decrease her meds more.

## 2015-12-10 ENCOUNTER — Telehealth (HOSPITAL_COMMUNITY): Payer: Self-pay | Admitting: *Deleted

## 2015-12-10 NOTE — Telephone Encounter (Signed)
Daughter called in stating they checked patient's heart rate and it was reading 47. Pt reports feeling more tired than normal but no other symptoms. Denies taking extra/different medication does not think she is dehydrated.  Instructed daughter to monitor HR prior to next dose of metoprolol if HR less than 60 hold dose. States her mother's heart rate is normally in the 80-90 range but they do not take her BP/HR daily. Encouraged HR check prior to doses of metoprolol if less than 60 do not take. If her HR is consistently staying 60-70 may decrease metoprolol dose to 12.5mg  BID.  Encouraged an extra glass of water today incase patient is dehydrated and ER recommendations were discussed. Pt daughter stated she will call back if she is unable to take metoprolol consistently.

## 2015-12-17 ENCOUNTER — Other Ambulatory Visit: Payer: Medicare Other

## 2015-12-17 ENCOUNTER — Other Ambulatory Visit (INDEPENDENT_AMBULATORY_CARE_PROVIDER_SITE_OTHER): Payer: Medicare Other

## 2015-12-17 DIAGNOSIS — E039 Hypothyroidism, unspecified: Secondary | ICD-10-CM

## 2015-12-17 LAB — TSH: TSH: 0.17 u[IU]/mL — AB (ref 0.35–4.50)

## 2015-12-17 LAB — T4, FREE: FREE T4: 1.81 ng/dL — AB (ref 0.60–1.60)

## 2015-12-29 ENCOUNTER — Telehealth: Payer: Self-pay

## 2015-12-29 NOTE — Telephone Encounter (Signed)
Tried to call patient and advise of change in medication as her labs were still abnormal. Advised patient we needed to make an appointment for labs in 5 weeks after she starts the new meds.

## 2015-12-30 ENCOUNTER — Telehealth: Payer: Self-pay

## 2015-12-30 ENCOUNTER — Encounter: Payer: Self-pay | Admitting: Internal Medicine

## 2015-12-30 NOTE — Telephone Encounter (Signed)
I called and spoke with patient daughter. She was not happy with the decrease in the synthroid. Patient daughter states that her mothers hair is falling out, and has been sleeping all the time. They really do not want to change patient from the 125 mcg. She says that when we changed her from 125 mcg to 112 mcg dose is when all this started happening, and now to go to a 100 mcg dose she states would be terrible for her. Please advise. Also states she wants 'other' labs to be drawn because those had to be wrong. Thank you.

## 2015-12-30 NOTE — Telephone Encounter (Signed)
Please advise of last note. Thank you.

## 2015-12-30 NOTE — Telephone Encounter (Signed)
To continue from last note:  'Patient' returned phone call from yesterday, I asked patient to verify birth date to receive lab results and changes in medication, Patient gave birth date and proceeded with conversation. Patient advised of what medication she was currently taking and states that her hair was falling out, after a 6 minute conversation with the 'patient' I was told that the whole time I was talking with the patient daughter pretending to be patient. After that time the daughter proceeded to raise her voice about how unhappy she was with the medication changes, and that she would rather her take the 125 mcg dosage and not change it and that the lab results had to be wrong and wanted other test ran. I advised daughter that I would let Dr.Gherghe know of her concerns. Talked with Dr.Gherghe at which time Dr.Gherghe is going to dismiss patient for breaking HIPPA law in pretending to be patient; and also refusing to stop taking the medication.

## 2016-01-02 ENCOUNTER — Other Ambulatory Visit: Payer: Self-pay | Admitting: Physician Assistant

## 2016-01-09 ENCOUNTER — Telehealth: Payer: Self-pay | Admitting: Cardiology

## 2016-01-09 NOTE — Telephone Encounter (Signed)
Received page from patient's daughter, called her back. The patient was feeling tired this am, and "just not feeling right". She called her daughter who lives across the street who came over and took the patients blood pressure. Her SBP was 150, and her HR was 154. Patient's daughter repeated BP and HR measurement about 30 minutes later and the BP was 129/73 and HR was 101. Patient had taken her diltiazem and metoprolol approx. One hour before all this occurred. Patient feels better now, denies chest pain and SOB. Denies palpitations and dizziness. Advised her to keep appt. With Rudi Cocoonna Carroll, NP this week.

## 2016-01-10 ENCOUNTER — Telehealth (HOSPITAL_COMMUNITY): Payer: Self-pay | Admitting: *Deleted

## 2016-01-10 NOTE — Telephone Encounter (Signed)
Patients daughter called to report that the pts bp this morning at 9:10 was 158/83 with a HR of 51 and then when checked at 10:20 this morning it was 134/103 with a HR of 155.  She left this info on vcml.  When I called th pt back, she stated that her daughter had left and she is unable to check her numbers again right now but that she feels better.  Pt also was unsure of whether or not she had gotten those readings before or after meds.  Pt is to keep all meds the same until her appt with Rudi Cocoonna Carroll, NP on Wed 01/12/16.  Pt understood and stated she would call if something else came up.

## 2016-01-12 ENCOUNTER — Ambulatory Visit (HOSPITAL_COMMUNITY)
Admission: RE | Admit: 2016-01-12 | Discharge: 2016-01-12 | Disposition: A | Discharge status: Home / Self Care | Payer: Medicare Other | Source: Ambulatory Visit | Attending: Nurse Practitioner | Admitting: Nurse Practitioner

## 2016-01-12 ENCOUNTER — Encounter (HOSPITAL_COMMUNITY): Payer: Self-pay | Admitting: Nurse Practitioner

## 2016-01-12 VITALS — BP 132/78 | Ht 63.0 in | Wt 159.4 lb

## 2016-01-12 DIAGNOSIS — Z87891 Personal history of nicotine dependence: Secondary | ICD-10-CM | POA: Diagnosis not present

## 2016-01-12 DIAGNOSIS — I482 Chronic atrial fibrillation, unspecified: Secondary | ICD-10-CM

## 2016-01-12 DIAGNOSIS — Z7901 Long term (current) use of anticoagulants: Secondary | ICD-10-CM | POA: Insufficient documentation

## 2016-01-12 DIAGNOSIS — M1990 Unspecified osteoarthritis, unspecified site: Secondary | ICD-10-CM | POA: Insufficient documentation

## 2016-01-12 DIAGNOSIS — Z880 Allergy status to penicillin: Secondary | ICD-10-CM | POA: Diagnosis not present

## 2016-01-12 DIAGNOSIS — I480 Paroxysmal atrial fibrillation: Secondary | ICD-10-CM

## 2016-01-12 DIAGNOSIS — Z79899 Other long term (current) drug therapy: Secondary | ICD-10-CM | POA: Insufficient documentation

## 2016-01-12 DIAGNOSIS — J449 Chronic obstructive pulmonary disease, unspecified: Secondary | ICD-10-CM | POA: Insufficient documentation

## 2016-01-12 DIAGNOSIS — E039 Hypothyroidism, unspecified: Secondary | ICD-10-CM | POA: Insufficient documentation

## 2016-01-12 DIAGNOSIS — I4891 Unspecified atrial fibrillation: Secondary | ICD-10-CM | POA: Diagnosis present

## 2016-01-12 MED ORDER — LEVOTHYROXINE SODIUM 125 MCG PO TABS: 125.0000 ug | ORAL_TABLET | Freq: Every day | ORAL | 0 refills | Status: DC

## 2016-01-12 NOTE — Progress Notes (Signed)
Patient ID: Katrina Velez, female   DOB: [birth year], 82 y.o.   MRN: [id number]       Primary Care Physician: Katrina EdinAUB, STEVE A, MD Referring Physician: Dr. Laqueta Velez   Katrina Velez is Velez 82 y.o. female with Velez h/o afib that was loaded on amiodarone and successfully cardioverted 4/13, but with ERAF. She did not see that much difference in SR and had some thyroid concerns  already on replacement, so decision was mad to stop amiodarone and aim for rate control. Her amiodarone had been reduced to 100 mg on last visit, and HR has been staying below 100 at home. Her energy is good and she is back to hanging clothes outside. No issues with anticoagulant. She was weaned off amiodarone with decent rate control.  Returns to afib clinic 8/23, after she has noticed at home some fluctuation in heart rate. Her daughter on taking her heart rate and Bp one day, noticed that hr heart rate was 48 bpm, although the pt did not feel bad. She talked to the on call provider and was advised to skip the next rate control drug. Velez few days later, the pt had Velez nonsustained episode of rvr at 150 bpm. She did feel lightheaded with that episode. On call provider was contacted and she was told to how to take extra RR control if needed. However after Velez few more minutes, her HR slowed down. Today EKG shows afib with controlled afib. She is also upset because she feels that she needs more thyroid replacement. She has also felt better on Velez higher dose than what the labs suggest that she takes. Her synthroid was reduced several months back and the pt is c/o thinning hair, thinning nails, more fatigue, depression. She is planning to change to Velez new MD in the first of October to see if she can get straightened out with her thyroid.   Today, she denies symptoms of palpitations, chest pain, shortness of breath, orthopnea, PND, lower extremity edema, dizziness, presyncope, syncope, or neurologic sequela. The patient is tolerating medications without  difficulties and is otherwise without complaint today.   Past Medical History:  Diagnosis Date  . Arthritis   . Cataract   . COPD (chronic obstructive pulmonary disease) (HCC)   . Permanent atrial fibrillation (HCC)   . Thyroid disease    Past Surgical History:  Procedure Laterality Date  . CARDIOVERSION N/Velez 06/08/2015   Procedure: CARDIOVERSION;  Surgeon: Jake BatheMark C Skains, MD;  [facility];  Service: Cardiovascular;  Laterality: N/Velez;  . CARDIOVERSION N/Velez 09/02/2015   Procedure: CARDIOVERSION;  Surgeon: Katrina LindenSuresh Velez Koneswaran, MD;  [facility];  Service: Cardiovascular;  Laterality: N/Velez;  . EYE SURGERY    . JOINT REPLACEMENT    . KNEE SURGERY    . right hip replacement    . TEE WITHOUT CARDIOVERSION N/Velez 06/08/2015   Procedure: TRANSESOPHAGEAL ECHOCARDIOGRAM (TEE);  Surgeon: Jake BatheMark C Skains, MD;  [facility];  Service: Cardiovascular;  Laterality: N/Velez;  . WRIST SURGERY     both wrists    Current Outpatient Prescriptions  Medication Sig Dispense Refill  . ascorbic acid (VITAMIN C) 500 MG tablet Take 1,000 mg by mouth daily.    Marland Kitchen. diltiazem (CARDIZEM LA) 120 MG 24 hr tablet Take 120mg  by mouth every morning. 90 tablet 6  . ELIQUIS 5 MG TABS tablet TAKE 1 TABLET (5 MG TOTAL) BY MOUTH 2 (TWO) TIMES DAILY. 60 tablet 8  . furosemide (LASIX) 20 MG tablet TAKE 1 TABLET  DAILY AS NEEDED FOR WEIGHT GAIN >3LBS. 30 tablet 3  . levothyroxine (SYNTHROID) 125 MCG tablet Take 1 tablet (125 mcg total) by mouth daily before breakfast. 30 tablet 0  . metoprolol succinate (TOPROL XL) 25 MG 24 hr tablet Take 1 tablet (25 mg total) by mouth 2 (two) times daily.    . Multiple Vitamin (MULTIVITAMIN) capsule Take 1 capsule by mouth daily.    . vitamin E 400 UNIT capsule Take 400 Units by mouth daily.    Marland Kitchen. loratadine (CLARITIN) 10 MG tablet Take 10 mg by mouth daily.     No current facility-administered medications for this encounter.     Allergies  Allergen Reactions  . Banana  Nausea Only    All banana products also  . Penicillins Hives    Social History   Social History  . Marital status: Widowed    Spouse name: N/Velez  . Number of children: N/Velez  . Years of education: N/Velez   Occupational History  . Not on file.   Social History Main Topics  . Smoking status: Former Smoker    Types: E-cigarettes    Quit date: 05/28/2011  . Smokeless tobacco: Never Used  . Alcohol use 0.0 oz/week  . Drug use: No  . Sexual activity: Not on file   Other Topics Concern  . Not on file   Social History Narrative  . No narrative on file    No family history on file.  ROS- All systems are reviewed and negative except as per the HPI above  Physical Exam: Vitals:   01/12/16 1116  BP: 132/78  Weight: 159 lb 6.4 oz (72.3 kg)  Height: 5\' 3"  (1.6 m)    GEN- The patient is well appearing, alert and oriented x 3 today.   Head- normocephalic, atraumatic Eyes-  Sclera clear, conjunctiva pink Ears- hearing intact Oropharynx- clear Neck- supple, no JVP Lymph- no cervical lymphadenopathy Lungs- Clear to ausculation bilaterally, normal work of breathing Heart- Irregular rate and rhythm, no murmurs, rubs or gallops, PMI not laterally displaced GI- soft, NT, ND, + BS Extremities- no clubbing, cyanosis, or edema MS- no significant deformity or atrophy Skin- no rash or lesion Psych- euthymic mood, full affect Neuro- strength and sensation are intact  EKG-aflutter with variable AV block,  v rate at 94 bpm, qrs int 84 ms, qtc 442 ms Epic records reviewed  Assessment and Plan: 1.Chronic afib, with swings noticed in rate control Continue metoprolol  25 mg bid, but gave parameters how to adjust for Velez slow heart rate and for rvr Continue cardizem Continue apixaban  48 hour monitor, looking for reasonable rate control or  for tachy/brady  2. Hypothyroidism  By TSH reports, pt appears to be hyperthyroid, but she states that she feels best when she is on higher doses of  synthroid and is concerned with loss of hair and paper thin nails that her thyroid . She is changing to Velez new MD in early October but will be out of synthroid prior to appointment, will call in 30 day supply of  synthroid as Velez courtesy, no refills.   F/u 2 weeks  Elvina SidleDonna C. Matthew Folksarroll, ANP-C [facility] [address] [place], [place] [id number] [phone]

## 2016-01-12 NOTE — Patient Instructions (Addendum)
Your physician has recommended that you wear a holter monitor. Holter monitors are medical devices that record the heart's electrical activity. Doctors most often use these monitors to diagnose arrhythmias. Arrhythmias are problems with the speed or rhythm of the heartbeat. The monitor is a small, portable device. You can wear one while you do your normal daily activities. This is usually used to diagnose what is causing palpitations/syncope (passing out).  This is scheduled Monday 08/28 at 2 pm [facility].     If heart rate is less than 50 Hold next dose of metoprolol or cardizem; If heart rate is over 120 and if systolic (top number) bp is atleast 100 or over you may take and extra 1/2 dose of metoprolol.

## 2016-01-13 ENCOUNTER — Other Ambulatory Visit (HOSPITAL_COMMUNITY): Payer: Self-pay | Admitting: Nurse Practitioner

## 2016-01-13 ENCOUNTER — Telehealth: Payer: Self-pay | Admitting: Internal Medicine

## 2016-01-13 MED ORDER — METOPROLOL SUCCINATE ER 25 MG PO TB24: 25.0000 mg | ORAL_TABLET | Freq: Two times a day (BID) | ORAL | Status: DC

## 2016-01-13 MED ORDER — LEVOTHYROXINE SODIUM 125 MCG PO TABS: 125.0000 ug | ORAL_TABLET | Freq: Every day | ORAL | 0 refills | Status: DC

## 2016-01-13 NOTE — Telephone Encounter (Signed)
Patient dismissed from [facility] by Carlus Pavlovristina Gherghe MD , effective December 30, 2015. Dismissal letter sent out by certified / registered mail. DAJ

## 2016-01-17 ENCOUNTER — Ambulatory Visit (INDEPENDENT_AMBULATORY_CARE_PROVIDER_SITE_OTHER): Payer: Medicare Other

## 2016-01-17 DIAGNOSIS — I480 Paroxysmal atrial fibrillation: Secondary | ICD-10-CM

## 2016-01-20 ENCOUNTER — Other Ambulatory Visit (HOSPITAL_COMMUNITY): Payer: Self-pay | Admitting: *Deleted

## 2016-01-20 MED ORDER — METOPROLOL SUCCINATE ER 25 MG PO TB24: 25.0000 mg | ORAL_TABLET | Freq: Two times a day (BID) | ORAL | 6 refills | Status: DC

## 2016-01-25 ENCOUNTER — Other Ambulatory Visit: Payer: Self-pay | Admitting: Emergency Medicine

## 2016-01-25 DIAGNOSIS — E038 Other specified hypothyroidism: Secondary | ICD-10-CM

## 2016-01-25 NOTE — Telephone Encounter (Signed)
Received signed domestic return receipt verifying delivery of certified letter on January 14, 2016. Article number [id number] DAJ

## 2016-01-26 ENCOUNTER — Encounter (HOSPITAL_COMMUNITY): Payer: Self-pay | Admitting: Nurse Practitioner

## 2016-01-26 ENCOUNTER — Ambulatory Visit (HOSPITAL_COMMUNITY)
Admission: RE | Admit: 2016-01-26 | Discharge: 2016-01-26 | Disposition: A | Discharge status: Home / Self Care | Payer: Medicare Other | Source: Ambulatory Visit | Attending: Nurse Practitioner | Admitting: Nurse Practitioner

## 2016-01-26 VITALS — BP 124/72 | HR 118 | Ht 63.0 in | Wt 160.8 lb

## 2016-01-26 DIAGNOSIS — Z7901 Long term (current) use of anticoagulants: Secondary | ICD-10-CM | POA: Insufficient documentation

## 2016-01-26 DIAGNOSIS — Z79899 Other long term (current) drug therapy: Secondary | ICD-10-CM | POA: Diagnosis not present

## 2016-01-26 DIAGNOSIS — I482 Chronic atrial fibrillation, unspecified: Secondary | ICD-10-CM

## 2016-01-26 DIAGNOSIS — M1990 Unspecified osteoarthritis, unspecified site: Secondary | ICD-10-CM | POA: Diagnosis not present

## 2016-01-26 DIAGNOSIS — Z87891 Personal history of nicotine dependence: Secondary | ICD-10-CM | POA: Diagnosis not present

## 2016-01-26 DIAGNOSIS — J449 Chronic obstructive pulmonary disease, unspecified: Secondary | ICD-10-CM | POA: Insufficient documentation

## 2016-01-26 DIAGNOSIS — E039 Hypothyroidism, unspecified: Secondary | ICD-10-CM | POA: Insufficient documentation

## 2016-01-26 DIAGNOSIS — Z880 Allergy status to penicillin: Secondary | ICD-10-CM | POA: Insufficient documentation

## 2016-01-26 MED ORDER — METOPROLOL SUCCINATE ER 25 MG PO TB24: ORAL_TABLET | ORAL | 3 refills | Status: DC

## 2016-01-26 NOTE — Progress Notes (Signed)
Patient ID: Katrina Velez, female   DOB: Dec 26, 1925, 82 y.o.   MRN: [id number]       Primary Care Physician: Lucilla EdinAUB, STEVE A, MD Referring Physician: Dr. Laqueta JeanAllred   Katrina Velez is a 82 y.o. female with a h/o afib that was loaded on amiodarone and successfully cardioverted 4/13, but with ERAF. She did not see that much difference in SR and had some thyroid concerns  already on replacement, so decision was mad to stop amiodarone and aim for rate control. Her amiodarone had been reduced to 100 mg on last visit, and HR has been staying below 100 at home. Her energy is good and she is back to hanging clothes outside. No issues with anticoagulant. She was weaned off amiodarone with decent rate control.  Returns to afib clinic 8/23, after she has noticed at home some fluctuation in heart rate. Her daughter on taking her heart rate and Bp one day, noticed that hr heart rate was 48 bpm, although the pt did not feel bad. She talked to the on call provider and was advised to skip the next rate control drug. A few days later, the pt had a nonsustained episode of rvr at 150 bpm. She did feel lightheaded with that episode. On call provider was contacted and she was told to how to take extra rate control if needed. However after a few more minutes, her HR slowed down.  EKG showed afib with controlled afib. She is also upset because she feels that she needs more thyroid replacement. She has also felt better on a higher dose than what the labs suggest that she takes. Her synthroid was reduced several months back and the pt is c/o thinning hair, thinning nails, more fatigue, depression. She is planning to change to a new MD in the first of October to see if she can get straightened out with her thyroid.   Returns 9/6, holter monitor placed on previous visit and reviewed. Shows average HR around 107 bpm, high rate 160 bpm, minimum 40 bpm at 4:30 am. Pt was not aware of fast or slow heart rate.She could benefit from more rate  control. She is still able to do her house work with minimal symptoms and feels that she has good rate control. Continues on   Today, she denies symptoms of palpitations, chest pain, shortness of breath, orthopnea, PND, lower extremity edema, dizziness, presyncope, syncope, or neurologic sequela. The patient is tolerating medications without difficulties and is otherwise without complaint today.   Past Medical History:  Diagnosis Date  . Arthritis   . Cataract   . COPD (chronic obstructive pulmonary disease) (HCC)   . Permanent atrial fibrillation (HCC)   . Thyroid disease    Past Surgical History:  Procedure Laterality Date  . CARDIOVERSION N/A 06/08/2015   Procedure: CARDIOVERSION;  Surgeon: Jake BatheMark C Skains, MD;  [facility];  Service: Cardiovascular;  Laterality: N/A;  . CARDIOVERSION N/A 09/02/2015   Procedure: CARDIOVERSION;  Surgeon: Laqueta LindenSuresh A Koneswaran, MD;  [facility];  Service: Cardiovascular;  Laterality: N/A;  . EYE SURGERY    . JOINT REPLACEMENT    . KNEE SURGERY    . right hip replacement    . TEE WITHOUT CARDIOVERSION N/A 06/08/2015   Procedure: TRANSESOPHAGEAL ECHOCARDIOGRAM (TEE);  Surgeon: Jake BatheMark C Skains, MD;  [facility];  Service: Cardiovascular;  Laterality: N/A;  . WRIST SURGERY     both wrists    Current Outpatient Prescriptions  Medication Sig Dispense Refill  .  ascorbic acid (VITAMIN C) 500 MG tablet Take 1,000 mg by mouth daily.    Marland Kitchen diltiazem (CARDIZEM LA) 120 MG 24 hr tablet Take 120mg  by mouth every morning. 90 tablet 6  . ELIQUIS 5 MG TABS tablet TAKE 1 TABLET (5 MG TOTAL) BY MOUTH 2 (TWO) TIMES DAILY. 60 tablet 8  . furosemide (LASIX) 20 MG tablet TAKE 1 TABLET DAILY AS NEEDED FOR WEIGHT GAIN >3LBS. 30 tablet 3  . levothyroxine (SYNTHROID) 125 MCG tablet Take 1 tablet (125 mcg total) by mouth daily before breakfast. 30 tablet 0  . loratadine (CLARITIN) 10 MG tablet Take 10 mg by mouth daily.    . metoprolol succinate  (TOPROL XL) 25 MG 24 hr tablet Take 1 and 1/2 tablet (37.5mg ) by mouth twice a day 90 tablet 3  . Multiple Vitamin (MULTIVITAMIN) capsule Take 1 capsule by mouth daily.    . vitamin E 400 UNIT capsule Take 400 Units by mouth daily.     No current facility-administered medications for this encounter.     Allergies  Allergen Reactions  . Banana Nausea Only    All banana products also  . Penicillins Hives    Social History   Social History  . Marital status: Widowed    Spouse name: N/A  . Number of children: N/A  . Years of education: N/A   Occupational History  . Not on file.   Social History Main Topics  . Smoking status: Former Smoker    Types: E-cigarettes    Quit date: 05/28/2011  . Smokeless tobacco: Never Used  . Alcohol use 0.0 oz/week  . Drug use: No  . Sexual activity: Not on file   Other Topics Concern  . Not on file   Social History Narrative  . No narrative on file    No family history on file.  ROS- All systems are reviewed and negative except as per the HPI above  Physical Exam: Vitals:   01/26/16 1048  BP: 124/72  Pulse: (!) 118  Weight: 160 lb 12.8 oz (72.9 kg)  Height: 5\' 3"  (1.6 m)    GEN- The patient is well appearing, alert and oriented x 3 today.   Head- normocephalic, atraumatic Eyes-  Sclera clear, conjunctiva pink Ears- hearing intact Oropharynx- clear Neck- supple, no JVP Lymph- no cervical lymphadenopathy Lungs- Clear to ausculation bilaterally, normal work of breathing Heart- Irregular rate and rhythm, no murmurs, rubs or gallops, PMI not laterally displaced GI- soft, NT, ND, + BS Extremities- no clubbing, cyanosis, or edema MS- no significant deformity or atrophy Skin- no rash or lesion Psych- euthymic mood, full affect Neuro- strength and sensation are intact  EKG-afib at 117 bpm,  v rate at 94 bpm, qrs int 80 ms, qtc 496 ms Epic records reviewed  Holter monitor reviewed  Assessment and Plan: 1.Chronic afib, per  monitor, can benefit from more rate control Increase metoprolol  25 mg 1 1/2 tab, bid Continue cardizem Continue apixaban  Pt will take BP/HR over the next week and report to the office   2. Hypothyroidism  By TSH reports, pt appears to be hyperthyroid, but she states that she feels best when she is on higher doses of synthroid and is concerned with loss of hair and paper thin nails.She is changing to a new MD to follow thyroid in early October   F/u late October  Geneive Sandstrom C. Matthew Folks Af[facility] [address] [place], [place] [address] [phone]

## 2016-01-26 NOTE — Patient Instructions (Signed)
Your physician has recommended you make the following change in your medication:  1)Increase metoprolol to 37.5mg  twice a day (1 and 1/2 tablets twice a day)  Call with blood pressure and heart rate in 1 week

## 2016-02-03 ENCOUNTER — Telehealth (HOSPITAL_COMMUNITY): Payer: Self-pay | Admitting: *Deleted

## 2016-02-03 NOTE — Telephone Encounter (Signed)
Patient called in with BP/HR log for week since increasing metoprolol dosing.  9/7--130/87 101 9/8--122/69 98 9/11-91/70 78 9/12--135/91 97 9/13--113/62 66 9/14--142/81 97 Pt  Reports feeling more tired and fatigued since increasing metoprolol. Discussed with Rudi Cocoonna Carroll NP will return to normal dose of 25mg  bid of metoprolol and increase cardizem 120mg  to BID. Pt will call back in 1 week with BP log and report of symptoms. Patient verbalized understanding.

## 2016-02-07 NOTE — Telephone Encounter (Signed)
Pt daughter called in stating pt was not feeling well today was feeling more short of breath and felt like her abdomen was bloated and full. Does not notice any swelling in lower extremities. Instructed to take a PRN lasix now and call with symptoms tomorrow.

## 2016-02-16 ENCOUNTER — Telehealth (HOSPITAL_COMMUNITY): Payer: Self-pay | Admitting: *Deleted

## 2016-02-16 MED ORDER — DILTIAZEM HCL ER COATED BEADS 120 MG PO TB24: 120.0000 mg | ORAL_TABLET | Freq: Two times a day (BID) | ORAL | 6 refills | Status: DC

## 2016-02-16 NOTE — Telephone Encounter (Signed)
Reviewed report with Rudi Cocoonna Carroll NP = ok with current medication regimen. Pt had been taking medication on empty stomach which maybe contributing to her nausea. Encouraged taking medications with food. Also questioned whether metoprolol or cardizem could cause nasal drip. Encouraged use of claritin to see if this helps with nasal drip.  Overall pt feeling much better on current medications. Will follow up as scheduled.

## 2016-02-16 NOTE — Telephone Encounter (Signed)
Pts daughter called with BPs for the week since medication adjustment 02/04/16  88/51 hr 82; later that day 123/63 HR 85; Pt is now taking   02/05/16 121/79 hr 85 02/06/16 144/61 HR 95 02/07/16 147/91 HR 93 02/08/16 128/78 HR 101 02/09/16 121/74 HR 96 02/10/16 126/72 HR 88 Pt has been feeling pretty good at these levels  02/14/16 135/81 and HR 94  02/15/2016 145/86 HR 94  Pt is taking cardizem twice daily and taking Metoprolol 25 mg bid.   Blood pressures obtained by daughter

## 2016-02-23 ENCOUNTER — Other Ambulatory Visit (HOSPITAL_COMMUNITY): Payer: Self-pay | Admitting: Nurse Practitioner

## 2016-02-25 ENCOUNTER — Encounter: Payer: Self-pay | Admitting: Internal Medicine

## 2016-02-25 ENCOUNTER — Ambulatory Visit (INDEPENDENT_AMBULATORY_CARE_PROVIDER_SITE_OTHER): Payer: Medicare Other | Admitting: Internal Medicine

## 2016-02-25 VITALS — BP 132/80 | HR 88 | Temp 97.8°F | Ht 63.0 in | Wt 161.4 lb

## 2016-02-25 DIAGNOSIS — Z87891 Personal history of nicotine dependence: Secondary | ICD-10-CM

## 2016-02-25 DIAGNOSIS — Z23 Encounter for immunization: Secondary | ICD-10-CM

## 2016-02-25 DIAGNOSIS — I480 Paroxysmal atrial fibrillation: Secondary | ICD-10-CM

## 2016-02-25 DIAGNOSIS — M1990 Unspecified osteoarthritis, unspecified site: Secondary | ICD-10-CM

## 2016-02-25 DIAGNOSIS — E034 Atrophy of thyroid (acquired): Secondary | ICD-10-CM

## 2016-02-25 LAB — COMPLETE METABOLIC PANEL WITH GFR
ALBUMIN: 4 g/dL (ref 3.6–5.1)
ALK PHOS: 66 U/L (ref 33–130)
ALT: 7 U/L (ref 6–29)
AST: 17 U/L (ref 10–35)
BUN: 16 mg/dL (ref 7–25)
CALCIUM: 10.1 mg/dL (ref 8.6–10.4)
CHLORIDE: 102 mmol/L (ref 98–110)
CO2: 29 mmol/L (ref 20–31)
Creat: 0.97 mg/dL — ABNORMAL HIGH (ref 0.60–0.88)
GFR, EST AFRICAN AMERICAN: 60 mL/min (ref >=60)
GFR, EST NON AFRICAN AMERICAN: 52 mL/min — AB (ref >=60)
Glucose, Bld: 93 mg/dL (ref 65–99)
POTASSIUM: 5.2 mmol/L (ref 3.5–5.3)
Sodium: 139 mmol/L (ref 135–146)
Total Bilirubin: 0.6 mg/dL (ref 0.2–1.2)
Total Protein: 6.8 g/dL (ref 6.1–8.1)

## 2016-02-25 LAB — T4, FREE: FREE T4: 1.8 ng/dL (ref 0.8–1.8)

## 2016-02-25 LAB — TSH: TSH: 0.19 m[IU]/L — AB

## 2016-02-25 NOTE — Progress Notes (Signed)
Patient ID: Katrina Velez, female   DOB: 02/23/1926, 82 y.o.   MRN: [id number]    Location:  [facility] Place of Service: OFFICE    Advanced Directive information Does patient have an advance directive?: No, Would patient like information on creating an advanced directive?: No - patient declined information  Chief Complaint  Patient presents with  . Establish Care    new patient to establish  . Flu Vaccine    requested  . pneu 23    requested    HPI:  82 yo female seen today as a new pt. She is c/a thyroid. She reports hair loss, brittle nails and palpitations. She has seen Endo and was told her dose of synthroid needed adjusting but she declined to do so. She will run out of med next week.  PAF - rate controlled on cardizem LA and metoprolol. She takes eliquis for anticoagulation. Followed by cardiology. She had a successful cardioversion in Arril 2017 but later reverted back to afib. She was hospitalized in Jan 2017 for afib w RVR, acute CHF and acute pulmonary edema. 2D echo showed EF 50-55%  Hx CHF - takes lasix daily prn "feeling bloated". Followed by cardiology  COPD/hx tobacco abuse - stable off meds. She no longer smokes  Arthritis - stable. Her pain is uncontrolled. She has taken ASA in the past but was told not to combine it with eliquis. She is intolerant of tylenol. She uses OTC lidocaine patch/cream  Hypothyroidism - takes brand name only synthroid 125mcg daily. TSH 0.17; free T4 1.81. Followed by Dr Gerghe. She is taking med fasting and 1st thing in the AM.     Past Medical History:  Diagnosis Date  . A-fib (HCC) 05/2015   HOSPITALIZED   . Arthritis   . Cataract   . COPD (chronic obstructive pulmonary disease) (HCC)   . Hypothyroidism   . Permanent atrial fibrillation (HCC)   . Thyroid disease     Past Surgical History:  Procedure Laterality Date  . CARDIOVERSION N/A 06/08/2015   Procedure: CARDIOVERSION;  Surgeon: Mark C Skains, MD;  Location: [facility];   Service: Cardiovascular;  Laterality: N/A;  . CARDIOVERSION N/A 09/02/2015   Procedure: CARDIOVERSION;  Surgeon: [name] Koneswaran, MD;  Location: [facility];  Service: Cardiovascular;  Laterality: N/A;  . CATARACT EXTRACTION, BILATERAL Bilateral 2010  . JOINT REPLACEMENT    . KNEE SURGERY  2002  . right hip replacement    . RIGHT KNEE REPLACEMENT  2011  . TEE WITHOUT CARDIOVERSION N/A 06/08/2015   Procedure: TRANSESOPHAGEAL ECHOCARDIOGRAM (TEE);  Surgeon: Mark C Skains, MD;  Location: [facility];  Service: Cardiovascular;  Laterality: N/A;  . TOTAL HIP ARTHROPLASTY Right 2011  . TOTAL SHOULDER REPLACEMENT Right 2003  . TOTAL SHOULDER REPLACEMENT Left 2007  . WRIST SURGERY Bilateral 2008   both wrists    Patient Care Team:  , DO as PCP - General (Internal Medicine) Christopher Groat, MD as Consulting Physician (Ophthalmology) Donna C Carroll, NP as Nurse Practitioner (Cardiology)  Social History   Social History  . Marital status: Widowed    Spouse name: N/A  . Number of children: N/A  . Years of education: N/A   Occupational History  . Not on file.   Social History Main Topics  . Smoking status: Former Smoker    Years: 64.00    Types: Cigarettes    Quit date: 05/28/2011  . Smokeless tobacco: Never Used  . Alcohol use 0.6 - 1.2 oz/week      1 - 2 Glasses of wine per week  . Drug use: No  . Sexual activity: Not on file   Other Topics Concern  . Not on file   Social History Narrative   DIET: NONE      DO YOU DRINK/EAT THINGS WITH CAFFEINE: YES, COFFEE AND TEA       MARITAL STATUS: WIDOWED      WHAT YEAR WERE YOU MARRIED:1950      DO YOU LIVE IN A HOUSE, APARTMENT, ASSISTED LIVING, CONDO TRAILER ETC.: HOUSE       IS IT ONE OR MORE STORIES: 2 STORIES      HOW MANY PERSONS LIVE IN YOUR HOME: 1      DO YOU HAVE PETS IN YOUR HOME: 1 CAT AND A PART TIME DOG       CURRENT OR PAST PROFESSION: CIVIL SERVANT      DO YOU EXERCISE: NO       WHAT TYPE  AND HOW OFTEN:NO     reports that she quit smoking about 4 years ago. Her smoking use included Cigarettes. She quit after 64.00 years of use. She has never used smokeless tobacco. She reports that she drinks about 0.6 - 1.2 oz of alcohol per week . She reports that she does not use drugs.  History reviewed. No pertinent family history. Family Status  Relation Status  . Mother Deceased  . Father Deceased  . Brother Alive  . Daughter Alive  . Son Alive  . Maternal Grandmother Deceased  . Maternal Grandfather Deceased  . Paternal Grandmother Deceased  . Paternal Grandfather Deceased  . Son Alive    Immunization History  Administered Date(s) Administered  . Influenza Split 03/25/2012  . Influenza,inj,Quad PF,36+ Mos 02/24/2013, 03/15/2015  . Influenza-Unspecified 05/01/2014  . Pneumococcal Conjugate-13 05/27/2014  . Zoster 05/22/2001    Allergies  Allergen Reactions  . Banana Nausea Only    All banana products also  . Penicillins Hives    Medications: Patient's Medications  New Prescriptions   No medications on file  Previous Medications   ASCORBIC ACID (VITAMIN C) 500 MG TABLET    Take 1,000 mg by mouth daily.   DILTIAZEM (CARDIZEM LA) 120 MG 24 HR TABLET    Take 1 tablet (120 mg total) by mouth 2 (two) times daily.   ELIQUIS 5 MG TABS TABLET    TAKE 1 TABLET (5 MG TOTAL) BY MOUTH 2 (TWO) TIMES DAILY.   FUROSEMIDE (LASIX) 20 MG TABLET    TAKE 1 TABLET DAILY AS NEEDED FOR WEIGHT GAIN >3LBS.   LEVOTHYROXINE (SYNTHROID) 125 MCG TABLET    Take 1 tablet (125 mcg total) by mouth daily before breakfast.   METOPROLOL SUCCINATE (TOPROL XL) 25 MG 24 HR TABLET    Take 1 and 1/2 tablet (37.5mg) by mouth twice a day   MULTIPLE VITAMIN (MULTIVITAMIN) CAPSULE    Take 1 capsule by mouth daily.   VITAMIN B-12 (CYANOCOBALAMIN) 1000 MCG TABLET    Take 1,000 mcg by mouth daily.   VITAMIN E 400 UNIT CAPSULE    Take 400 Units by mouth daily.  Modified Medications   No medications on file    Discontinued Medications   LORATADINE (CLARITIN) 10 MG TABLET    Take 10 mg by mouth daily.    Review of Systems  Constitutional: Positive for fatigue and unexpected weight change (weight gain).  HENT: Positive for dental problem (wears dentures), hearing loss (uses hearing aid), rhinorrhea and tinnitus.   Eyes:   Positive for itching.       Dry eyes  Musculoskeletal: Positive for arthralgias and back pain.  Neurological: Positive for weakness.  Hematological:       Petechiae  Psychiatric/Behavioral: Positive for agitation, dysphoric mood and sleep disturbance. The patient is nervous/anxious.   All other systems reviewed and are negative.   Vitals:   02/25/16 1116  BP: 132/80  Pulse: 88  Temp: 97.8 F (36.6 C)  TempSrc: Oral  SpO2: 95%  Weight: 161 lb 6.4 oz (73.2 kg)  Height: 5' 3" (1.6 m)   Body mass index is 28.59 kg/m.  Physical Exam  Constitutional: She is oriented to person, place, and time. She appears well-developed and well-nourished.  HENT:  Mouth/Throat: Oropharynx is clear and moist. No oropharyngeal exudate.  Eyes: Pupils are equal, round, and reactive to light. No scleral icterus.  Neck: Neck supple. Carotid bruit is not present. No tracheal deviation present. No thyromegaly present.  Cardiovascular: Normal rate and intact distal pulses.  An irregularly irregular rhythm present. Exam reveals no gallop and no friction rub.   Murmur heard.  Systolic murmur is present with a grade of 1/6  Trace LE edema b/l. No calf TTP  Pulmonary/Chest: Effort normal and breath sounds normal. No stridor. No respiratory distress. She has no wheezes. She has no rales.  Abdominal: Soft. Bowel sounds are normal. She exhibits no distension and no mass. There is no hepatomegaly. There is no tenderness. There is no rebound and no guarding.  Musculoskeletal: She exhibits edema.  Lymphadenopathy:    She has no cervical adenopathy.  Neurological: She is alert and oriented to person,  place, and time. She has normal reflexes.  Skin: Skin is warm and dry. No rash noted.  Thin hair  Psychiatric: She has a normal mood and affect. Her behavior is normal. Judgment and thought content normal.     Labs reviewed: Abstract on 02/04/2016  Component Date Value Ref Range Status  . HM Colonoscopy 05/22/2001 Patient Reported  See Report (in chart), Patient Reported Final  Lab on 12/17/2015  Component Date Value Ref Range Status  . Free T4 12/17/2015 1.81* 0.60 - 1.60 ng/dL Final  . TSH 12/17/2015 0.17* 0.35 - 4.50 uIU/mL Final    No results found.   Assessment/Plan   ICD-9-CM ICD-10-CM   1. Hypothyroidism due to acquired atrophy of thyroid 244.8 E03.4 CMP with eGFR   246.8  TSH     T4, Free  2. PAF (paroxysmal atrial fibrillation) (HCC) 427.31 I48.0 CMP with eGFR  3. Arthritis 716.90 M19.90   4. History of tobacco abuse V15.82 Z87.891   5. Need for 23-polyvalent pneumococcal polysaccharide vaccine V03.82 Z23 Pneumococcal polysaccharide vaccine 23-valent greater than or equal to 2yo subcutaneous/IM  6. Encounter for immunization Z23 Z23 Flu Vaccine QUAD 36+ mos IM    T/c ultram vs voltaren gel  vs opioid for joint pain if it worsens  Continue current medications as ordered  Follow up in 1-2 mos for CPE/MMSE/ECG  Will call with lab results  Follow up with cardiology as scheduled    S. , D. O., F. A. C. O. I.  [facility] [address] [place], [place] [address] [phone] Cell (Monday-Friday 8 AM - 5 PM) [phone] After 5 PM and follow prompts   

## 2016-02-25 NOTE — Patient Instructions (Signed)
Continue current medications as ordered  Follow up in 1-2 mos for CPE/MMSE/ECG  Will call with lab results  Follow up with cardiology as scheduled

## 2016-02-28 ENCOUNTER — Other Ambulatory Visit: Payer: Self-pay

## 2016-02-28 MED ORDER — LEVOTHYROXINE SODIUM 125 MCG PO TABS: 125.0000 ug | ORAL_TABLET | Freq: Every day | ORAL | 4 refills | Status: DC

## 2016-03-01 ENCOUNTER — Encounter: Payer: Self-pay | Admitting: Internal Medicine

## 2016-03-01 ENCOUNTER — Ambulatory Visit (INDEPENDENT_AMBULATORY_CARE_PROVIDER_SITE_OTHER): Payer: Medicare Other | Admitting: Internal Medicine

## 2016-03-01 VITALS — BP 126/80 | HR 83 | Temp 97.5°F | Resp 12 | Ht 63.0 in | Wt 159.0 lb

## 2016-03-01 DIAGNOSIS — E034 Atrophy of thyroid (acquired): Secondary | ICD-10-CM

## 2016-03-01 DIAGNOSIS — E0580 Other thyrotoxicosis without thyrotoxic crisis or storm: Secondary | ICD-10-CM | POA: Diagnosis not present

## 2016-03-01 DIAGNOSIS — I482 Chronic atrial fibrillation: Secondary | ICD-10-CM

## 2016-03-01 DIAGNOSIS — M1990 Unspecified osteoarthritis, unspecified site: Secondary | ICD-10-CM | POA: Insufficient documentation

## 2016-03-01 DIAGNOSIS — M1991 Primary osteoarthritis, unspecified site: Secondary | ICD-10-CM

## 2016-03-01 DIAGNOSIS — I4821 Permanent atrial fibrillation: Secondary | ICD-10-CM

## 2016-03-01 MED ORDER — TRAMADOL HCL 50 MG PO TABS: ORAL_TABLET | ORAL | 3 refills | Status: DC

## 2016-03-01 MED ORDER — METOPROLOL SUCCINATE ER 25 MG PO TB24: ORAL_TABLET | ORAL | 3 refills | Status: DC

## 2016-03-01 MED ORDER — DILTIAZEM HCL ER COATED BEADS 120 MG PO TB24: 120.0000 mg | ORAL_TABLET | Freq: Two times a day (BID) | ORAL | 6 refills | Status: DC

## 2016-03-01 NOTE — Patient Instructions (Addendum)
I recommend that levothyroxine should be reduced to 100 mcg daily. It may help to increase the metoprolol again to 50 mg twice daily. Take the furosemide 20 mg each morning.

## 2016-03-01 NOTE — Progress Notes (Signed)
Facility  [facility]    Place of Service:   OFFICE    Allergies  Allergen Reactions  . Banana Nausea Only    All banana products also  . Penicillins Hives    Chief Complaint  Patient presents with  . Acute Visit    Light-headed and SOB   . Arthritis    Patient in need of medication for arthritis pain- see Dr.Carter's last OV note     HPI:   Complains of lightheaded felling increasing since last week.   Complains of dyspnea increasing. Hx of cardiomegaly and interstitial edema on CXR in Jan 2017. Not using Furosemide much. Had been told to use it if weight increased 3 #.  Permanent atrial fibrillation ([place]) - continues in AF with rapid rate.  Primary osteoarthritis, unspecified site - hurting in multiple places with migration of discomfort. inable to get relief from Tylenol and was told to avoid NSAID due to taking Eliquis.  Hypothyroidism due to surgical removal of goiter.  Iatrogenic hyperthyroidism - appears to be on too high a dose of supplemental levothyroxine. She is resistant to changing this. Feels like she is not on enough supplement due to alopecia and brittle nails. Did not realize ths can be associated with hyperthyroid state.     Medications: Patient's Medications  New Prescriptions   No medications on file  Previous Medications   ASCORBIC ACID (VITAMIN C) 500 MG TABLET    Take 1,000 mg by mouth daily.   DILTIAZEM (CARDIZEM LA) 120 MG 24 HR TABLET    Take 1 tablet (120 mg total) by mouth 2 (two) times daily.   ELIQUIS 5 MG TABS TABLET    TAKE 1 TABLET (5 MG TOTAL) BY MOUTH 2 (TWO) TIMES DAILY.   FUROSEMIDE (LASIX) 20 MG TABLET    TAKE 1 TABLET DAILY AS NEEDED FOR WEIGHT GAIN >3LBS.   LEVOTHYROXINE (SYNTHROID) 125 MCG TABLET    Take 1 tablet (125 mcg total) by mouth daily before breakfast.   METOPROLOL SUCCINATE (TOPROL XL) 25 MG 24 HR TABLET    Take 1 and 1/2 tablet (37.48m) by mouth twice a day   MULTIPLE VITAMIN (MULTIVITAMIN) CAPSULE    Take 1 capsule by  mouth daily.   VITAMIN B-12 (CYANOCOBALAMIN) 1000 MCG TABLET    Take 1,000 mcg by mouth daily.   VITAMIN E 400 UNIT CAPSULE    Take 400 Units by mouth daily.  Modified Medications   No medications on file  Discontinued Medications   No medications on file    Review of Systems  Constitutional: Positive for fatigue and unexpected weight change (weight gain). Negative for activity change, appetite change, chills, diaphoresis and fever.  HENT: Positive for dental problem (wears dentures), hearing loss (uses hearing aid), rhinorrhea and tinnitus. Negative for congestion, ear discharge, ear pain, postnasal drip, sore throat, trouble swallowing and voice change.   Eyes: Positive for itching. Negative for pain, redness and visual disturbance.       Dry eyes  Respiratory: Positive for shortness of breath. Negative for cough, choking and wheezing.   Cardiovascular: Positive for palpitations. Negative for chest pain and leg swelling.       AF since Jan2017. Hx cardiomegaly.  Gastrointestinal: Negative for abdominal distention, abdominal pain, constipation, diarrhea and nausea.  Endocrine: Negative for cold intolerance, heat intolerance, polydipsia, polyphagia and polyuria.       Iatrogenic hyperthyroidism. Hx removal of goiter.  Genitourinary: Negative for difficulty urinating, dysuria, flank pain, frequency, hematuria, pelvic  pain, urgency and vaginal discharge.  Musculoskeletal: Positive for arthralgias and back pain. Negative for gait problem, myalgias, neck pain and neck stiffness.  Skin: Negative for color change, pallor and rash.       Alopecia and brittle nails  Allergic/Immunologic: Negative.   Neurological: Positive for dizziness, weakness and light-headedness. Negative for tremors, seizures, syncope, numbness and headaches.  Hematological: Negative for adenopathy. Does not bruise/bleed easily.       Petechiae  Psychiatric/Behavioral: Positive for agitation, dysphoric mood and sleep  disturbance. Negative for behavioral problems, confusion, hallucinations and suicidal ideas. The patient is nervous/anxious. The patient is not hyperactive.   All other systems reviewed and are negative.   Vitals:   03/01/16 1224  BP: 126/80  Pulse: 83  Resp: 12  Temp: 97.5 F (36.4 C)  TempSrc: Oral  SpO2: 97%  Weight: 159 lb (72.1 kg)  Height: 5' 3"  (1.6 m)   Body mass index is 28.17 kg/m. Wt Readings from Last 3 Encounters:  03/01/16 159 lb (72.1 kg)  02/25/16 161 lb 6.4 oz (73.2 kg)  01/26/16 160 lb 12.8 oz (72.9 kg)      Physical Exam  Constitutional: She is oriented to person, place, and time. She appears well-developed and well-nourished. No distress.  obese  HENT:  Right Ear: External ear normal.  Left Ear: External ear normal.  Nose: Nose normal.  Mouth/Throat: Oropharynx is clear and moist. No oropharyngeal exudate.  Eyes: Conjunctivae and EOM are normal. Pupils are equal, round, and reactive to light. No scleral icterus.  Neck: Neck supple. No JVD present. Carotid bruit is not present. No tracheal deviation present. No thyromegaly present.  Cardiovascular: Normal rate and intact distal pulses.  An irregularly irregular rhythm present. Exam reveals no gallop and no friction rub.   Murmur heard.  Systolic murmur is present with a grade of 1/6  Trace LE edema b/l. No calf TTP  Pulmonary/Chest: Effort normal. No stridor. No respiratory distress. She has no wheezes. She has rales. She exhibits no tenderness.  Abdominal: Soft. Bowel sounds are normal. She exhibits no distension and no mass. There is no hepatomegaly. There is no tenderness. There is no rebound and no guarding.  Musculoskeletal: Normal range of motion. She exhibits edema. She exhibits no tenderness.  Lymphadenopathy:    She has no cervical adenopathy.  Neurological: She is alert and oriented to person, place, and time. She has normal reflexes. No cranial nerve deficit. Coordination normal.  Skin: Skin  is warm and dry. No rash noted. She is not diaphoretic. No erythema. No pallor.  Thin hair  Psychiatric: She has a normal mood and affect. Her behavior is normal. Judgment and thought content normal.    Labs reviewed: Lab Summary Latest Ref Rng & Units 02/25/2016 08/25/2015  Hemoglobin 12.0 - 15.0 g/dL (None) 14.5  Hematocrit 36.0 - 46.0 % (None) 44.8  White count 4.0 - 10.5 K/uL (None) 6.3  Platelet count 150 - 400 K/uL (None) 353  Sodium 135 - 146 mmol/L 139 140  Potassium 3.5 - 5.3 mmol/L 5.2 4.2  Calcium 8.6 - 10.4 mg/dL 10.1 9.7  Phosphorus - (None) (None)  Creatinine 0.60 - 0.88 mg/dL 0.97(H) 0.99  AST 10 - 35 U/L 17 (None)  Alk Phos 33 - 130 U/L 66 (None)  Bilirubin 0.2 - 1.2 mg/dL 0.6 (None)  Glucose 65 - 99 mg/dL 93 97  Cholesterol - (None) (None)  HDL cholesterol - (None) (None)  Triglycerides - (None) (None)  LDL Direct - (None) (  None)  LDL Calc - (None) (None)  Total protein 6.1 - 8.1 g/dL 6.8 (None)  Albumin 3.6 - 5.1 g/dL 4.0 (None)  Some recent data might be hidden   Lab Results  Component Value Date   TSH 0.19 (L) 02/25/2016   TSH 0.17 (L) 12/17/2015   TSH 0.20 (L) 11/15/2015   T4TOTAL 9.4 08/03/2015   Lab Results  Component Value Date   BUN 16 02/25/2016   BUN 14 08/25/2015   BUN 11 06/08/2015   No results found for: HGBA1C  Assessment/Plan  1. Primary osteoarthritis, unspecified site - traMADol (ULTRAM) 50 MG tablet; One every 8 hours if needed for pain  Dispense: 50 tablet; Refill: 3  2. Permanent atrial fibrillation (HCC) - metoprolol succinate (TOPROL XL) 25 MG 24 hr tablet; Take 1 tablet by mouth twice a day  Dispense: 180 tablet; Refill: 3 - diltiazem (CARDIZEM LA) 120 MG 24 hr tablet; Take 1 tablet (120 mg total) by mouth 2 (two) times daily.  Dispense: 60 tablet; Refill: 6 -I recommended increasing metoprolol to 50 mg twice daily, but she wants to check with cardiology first. I think iatrogenic hyperthyroidism is contributing to to her AF and  tachycardia.  3. Hypothyroidism due to acquired atrophy of thyroid Should be on lower dose levothyroxine, but she does not want me to change dose today  4. Iatrogenic hyperthyroidism Should be on lower dose of levothyroxine. Educational materials provided.

## 2016-03-15 ENCOUNTER — Ambulatory Visit (HOSPITAL_COMMUNITY)
Admission: RE | Admit: 2016-03-15 | Discharge: 2016-03-15 | Disposition: A | Discharge status: Home / Self Care | Payer: Medicare Other | Source: Ambulatory Visit | Attending: Nurse Practitioner | Admitting: Nurse Practitioner

## 2016-03-15 ENCOUNTER — Encounter (HOSPITAL_COMMUNITY): Payer: Self-pay | Admitting: Nurse Practitioner

## 2016-03-15 DIAGNOSIS — Z91018 Allergy to other foods: Secondary | ICD-10-CM | POA: Diagnosis not present

## 2016-03-15 DIAGNOSIS — Z9841 Cataract extraction status, right eye: Secondary | ICD-10-CM | POA: Diagnosis not present

## 2016-03-15 DIAGNOSIS — Z96659 Presence of unspecified artificial knee joint: Secondary | ICD-10-CM | POA: Insufficient documentation

## 2016-03-15 DIAGNOSIS — Z96611 Presence of right artificial shoulder joint: Secondary | ICD-10-CM | POA: Diagnosis not present

## 2016-03-15 DIAGNOSIS — Z9842 Cataract extraction status, left eye: Secondary | ICD-10-CM | POA: Insufficient documentation

## 2016-03-15 DIAGNOSIS — Z880 Allergy status to penicillin: Secondary | ICD-10-CM | POA: Insufficient documentation

## 2016-03-15 DIAGNOSIS — I4821 Permanent atrial fibrillation: Secondary | ICD-10-CM

## 2016-03-15 DIAGNOSIS — Z87891 Personal history of nicotine dependence: Secondary | ICD-10-CM | POA: Diagnosis not present

## 2016-03-15 DIAGNOSIS — Z7901 Long term (current) use of anticoagulants: Secondary | ICD-10-CM | POA: Insufficient documentation

## 2016-03-15 DIAGNOSIS — I482 Chronic atrial fibrillation: Secondary | ICD-10-CM | POA: Diagnosis not present

## 2016-03-15 DIAGNOSIS — Z96612 Presence of left artificial shoulder joint: Secondary | ICD-10-CM | POA: Insufficient documentation

## 2016-03-15 DIAGNOSIS — E039 Hypothyroidism, unspecified: Secondary | ICD-10-CM | POA: Diagnosis not present

## 2016-03-15 DIAGNOSIS — Z96641 Presence of right artificial hip joint: Secondary | ICD-10-CM | POA: Diagnosis not present

## 2016-03-15 DIAGNOSIS — M1990 Unspecified osteoarthritis, unspecified site: Secondary | ICD-10-CM | POA: Insufficient documentation

## 2016-03-15 DIAGNOSIS — E0590 Thyrotoxicosis, unspecified without thyrotoxic crisis or storm: Secondary | ICD-10-CM | POA: Insufficient documentation

## 2016-03-15 MED ORDER — DILTIAZEM HCL ER COATED BEADS 120 MG PO TB24: 120.0000 mg | ORAL_TABLET | Freq: Two times a day (BID) | ORAL | 3 refills | Status: DC

## 2016-03-15 MED ORDER — METOPROLOL SUCCINATE ER 25 MG PO TB24: ORAL_TABLET | ORAL | 3 refills | Status: DC

## 2016-03-15 MED ORDER — APIXABAN 5 MG PO TABS: 5.0000 mg | ORAL_TABLET | Freq: Two times a day (BID) | ORAL | 3 refills | Status: DC

## 2016-03-15 NOTE — Progress Notes (Signed)
Patient ID: Katrina Velez, female   DOB: Apr 19, 1926, 82 y.o.   MRN: [id number]       Primary Care Physician: Kirt Boysarter, Monica, DO Referring Physician: Dr. Laqueta JeanAllred   Katrina Velez is a 82 y.o. female with a h/o afib that was loaded on amiodarone and successfully cardioverted 4/13, but with ERAF. She did not see that much difference in SR and had some thyroid concerns  already on replacement, so decision was made to stop amiodarone and aim for rate control,HR has been staying below 100 at home. Her energy is good for her age and she is back to hanging clothes outside. No issues with anticoagulant.   Returns to afib clinic 8/23, after she has noticed at home some fluctuation in heart rate. Her daughter on taking her heart rate and Bp one day, noticed that hr heart rate was 48 bpm, although the pt did not feel bad. She talked to the on call provider and was advised to skip the next rate control drug. A few days later, the pt had a nonsustained episode of rvr at 150 bpm. She did feel lightheaded with that episode. On call provider was contacted and she was told to how to take extra rate control if needed. However after a few more minutes, her HR slowed down.  EKG showed afib with controlled afib. She is also upset because she feels that she needs more thyroid replacement. She has also felt better on a higher dose than what the labs suggest that she takes. Her synthroid was reduced several months back and the pt is c/o thinning hair, thinning nails, more fatigue, depression. She is planning to change to a new MD in the first of October to see if she can get straightened out with her thyroid.   Returns 9/6, holter monitor placed on previous visit and reviewed. Shows average HR around 107 bpm, high rate 160 bpm, minimum 40 bpm at 4:30 am. Pt was not aware of fast or slow heart rate.She could benefit from more rate control. She is still able to do her house work with minimal symptoms and feels that she has good rate  control.   F/u in the afib clinic 10/25 and has good rate control. Currently is on metoprolol ER 25 mg bid and cardizem 120 mg bid. Tried higher doses of BB but could not tolerate. She is tolerating this combination of rate control with HR in the 80's.   Today, she denies symptoms of palpitations, chest pain, shortness of breath, orthopnea, PND, lower extremity edema, dizziness, presyncope, syncope, or neurologic sequela. The patient is tolerating medications without difficulties and is otherwise without complaint today.   Past Medical History:  Diagnosis Date  . A-fib (HCC) 05/2015   HOSPITALIZED   . Arthritis   . Cataract   . Hypothyroidism   . Permanent atrial fibrillation (HCC)   . Thyroid disease    Past Surgical History:  Procedure Laterality Date  . CARDIOVERSION N/A 06/08/2015   Procedure: CARDIOVERSION;  Surgeon: Jake BatheMark C Skains, MD;  [facility];  Service: Cardiovascular;  Laterality: N/A;  . CARDIOVERSION N/A 09/02/2015   Procedure: CARDIOVERSION;  Surgeon: Laqueta LindenSuresh A Koneswaran, MD;  [facility];  Service: Cardiovascular;  Laterality: N/A;  . CATARACT EXTRACTION, BILATERAL Bilateral 2010  . JOINT REPLACEMENT    . KNEE SURGERY  2002  . right hip replacement    . RIGHT KNEE REPLACEMENT  2011  . TEE WITHOUT CARDIOVERSION N/A 06/08/2015   Procedure: TRANSESOPHAGEAL ECHOCARDIOGRAM (TEE);  Surgeon: Jake Bathe, MD;  Location[facility]COPY;  Service: Cardiovascular;  Laterality: N/A;  . TOTAL HIP ARTHROPLASTY Right 2011  . TOTAL SHOULDER REPLACEMENT Right 2003  . TOTAL SHOULDER REPLACEMENT Left 2007  . WRIST SURGERY Bilateral 2008   both wrists    Current Outpatient Prescriptions  Medication Sig Dispense Refill  . apixaban (ELIQUIS) 5 MG TABS tablet Take 1 tablet (5 mg total) by mouth 2 (two) times daily. 180 tablet 3  . ascorbic acid (VITAMIN C) 500 MG tablet Take 1,000 mg by mouth daily.    Marland Kitchen diltiazem (CARDIZEM LA) 120 MG 24 hr tablet Take 1 tablet  (120 mg total) by mouth 2 (two) times daily. 180 tablet 3  . furosemide (LASIX) 20 MG tablet TAKE 1 TABLET DAILY AS NEEDED FOR WEIGHT GAIN >3LBS. 30 tablet 3  . levothyroxine (SYNTHROID) 125 MCG tablet Take 1 tablet (125 mcg total) by mouth daily before breakfast. 30 tablet 4  . metoprolol succinate (TOPROL XL) 25 MG 24 hr tablet Take 1 tablet by mouth twice a day 180 tablet 3  . Multiple Vitamin (MULTIVITAMIN) capsule Take 1 capsule by mouth daily.    . traMADol (ULTRAM) 50 MG tablet One every 8 hours if needed for pain 50 tablet 3  . vitamin B-12 (CYANOCOBALAMIN) 1000 MCG tablet Take 1,000 mcg by mouth daily.    . vitamin E 400 UNIT capsule Take 400 Units by mouth daily.     No current facility-administered medications for this encounter.     Allergies  Allergen Reactions  . Banana Nausea Only    All banana products also  . Penicillins Hives    Social History   Social History  . Marital status: Widowed    Spouse name: N/A  . Number of children: N/A  . Years of education: N/A   Occupational History  . Not on file.   Social History Main Topics  . Smoking status: Former Smoker    Years: 64.00    Types: Cigarettes    Quit date: 05/28/2011  . Smokeless tobacco: Never Used  . Alcohol use 0.6 - 1.2 oz/week    1 - 2 Glasses of wine per week  . Drug use: No  . Sexual activity: Not on file   Other Topics Concern  . Not on file   Social History Narrative   DIET: NONE      DO YOU DRINK/EAT THINGS WITH CAFFEINE: YES, COFFEE AND TEA       MARITAL STATUS: WIDOWED      WHAT YEAR WERE YOU MARRIED:1950      DO YOU LIVE IN A HOUSE, APARTMENT, ASSISTED LIVING, CONDO TRAILER ETC.: HOUSE       IS IT ONE OR MORE STORIES: 2 STORIES      HOW MANY PERSONS LIVE IN YOUR HOME: 1      DO YOU HAVE PETS IN YOUR HOME: 1 CAT AND A PART TIME DOG       CURRENT OR PAST PROFESSION: CIVIL SERVANT      DO YOU EXERCISE: NO       WHAT TYPE AND HOW OFTEN:NO    No family history on  file.  ROS- All systems are reviewed and negative except as per the HPI above  Physical Exam: Vitals:   03/15/16 1039  BP: 116/78  Pulse: 83  Weight: 159 lb 9.6 oz (72.4 kg)  Height: 5\' 3"  (1.6 m)    GEN- The patient is well appearing, alert and oriented  x 3 today.   Head- normocephalic, atraumatic Eyes-  Sclera clear, conjunctiva pink Ears- hearing intact Oropharynx- clear Neck- supple, no JVP Lymph- no cervical lymphadenopathy Lungs- Clear to ausculation bilaterally, normal work of breathing Heart- Irregular rate and rhythm, no murmurs, rubs or gallops, PMI not laterally displaced GI- soft, NT, ND, + BS Extremities- no clubbing, cyanosis, or edema MS- no significant deformity or atrophy Skin- no rash or lesion Psych- euthymic mood, full affect Neuro- strength and sensation are intact  EKG-afib at 83 bpm,   qrs int 82 ms, qtc 415 ms Epic records reviewed Holter monitor reviewed  Assessment and Plan: 1.Chronic afib Continue metoprolol ER 25 mg bid and cardizem 120 mg bid, appears to have HR well controlled with this dose Continue eliquis 5 mg bid, Creatinine last checked 10/6 and stable at 0.97 ms, no bleeding issues  2. Hypothyroidism  By TSH reports, pt appears to be hyperthyroid, has recently seen two MD's who suggested that pt decrease amount of synthroid but she refuses, she states that she feels best when she is on higher doses of synthroid and is concerned with loss of hair and paper thin nails.  F/u 6 months  Elvina Sidle. [facility] [address] [place], [place] [address] [phone]

## 2016-03-16 ENCOUNTER — Ambulatory Visit: Payer: Medicare Other | Admitting: Internal Medicine

## 2016-04-12 DIAGNOSIS — H10413 Chronic giant papillary conjunctivitis, bilateral: Secondary | ICD-10-CM | POA: Diagnosis not present

## 2016-04-12 DIAGNOSIS — Z961 Presence of intraocular lens: Secondary | ICD-10-CM | POA: Diagnosis not present

## 2016-04-12 DIAGNOSIS — H02834 Dermatochalasis of left upper eyelid: Secondary | ICD-10-CM | POA: Diagnosis not present

## 2016-04-12 DIAGNOSIS — H02831 Dermatochalasis of right upper eyelid: Secondary | ICD-10-CM | POA: Diagnosis not present

## 2016-04-12 DIAGNOSIS — H04123 Dry eye syndrome of bilateral lacrimal glands: Secondary | ICD-10-CM | POA: Diagnosis not present

## 2016-04-21 ENCOUNTER — Telehealth (HOSPITAL_COMMUNITY): Payer: Self-pay | Admitting: *Deleted

## 2016-04-21 NOTE — Telephone Encounter (Signed)
Pt daughter called in stating her mother was feeling short of breath so she checked her BP/HR 140/80s; 140s. After sitting down for few minutes BP 114/78 ; 90. Pt daughter states she does not have swelling,bloating or weight gain. She does notice some wheezing/congestion -- recommended maybe she should be evaluated by her PCP if underlying URI could be driving shortness of breath/increased HR. Instructed her if HR persisted being elevated could take extra tablet of metoprolol a day. Daughter will call back if further problems.

## 2016-05-04 ENCOUNTER — Telehealth (HOSPITAL_COMMUNITY): Payer: Self-pay | Admitting: *Deleted

## 2016-05-04 NOTE — Telephone Encounter (Signed)
Patient's daughter called in stating patient has been more short of breath last several days and is concerned of what she should do. Pt does not report orthopnea or lower extremity edema but does notice her abdomen feeling bloated. Instructed pt to take lasix 20mg  now and call in morning with report of how she is feeling. If no better will bring in for assessment.

## 2016-05-19 ENCOUNTER — Encounter: Payer: Medicare Other | Admitting: Internal Medicine

## 2016-06-07 ENCOUNTER — Encounter: Payer: Medicare Other | Admitting: Internal Medicine

## 2016-06-22 ENCOUNTER — Telehealth: Payer: Self-pay | Admitting: Internal Medicine

## 2016-06-22 NOTE — Telephone Encounter (Signed)
left msg asking pt to call and schedule AWV that's due now. VDM (DD) ° °

## 2016-06-23 NOTE — Telephone Encounter (Signed)
Patient returned call and stated she is unable to leave her house at this time and is unable to schedule an AWV

## 2016-07-13 ENCOUNTER — Other Ambulatory Visit: Payer: Self-pay | Admitting: Internal Medicine

## 2016-08-16 ENCOUNTER — Telehealth: Payer: Self-pay | Admitting: Internal Medicine

## 2016-08-16 NOTE — Telephone Encounter (Signed)
left msg asking pt to confirm this AWV (initial) appt w/ nurse. VDM (DD)

## 2016-09-13 ENCOUNTER — Ambulatory Visit (HOSPITAL_COMMUNITY)
Admission: RE | Admit: 2016-09-13 | Discharge: 2016-09-13 | Disposition: A | Discharge status: Home / Self Care | Payer: Medicare Other | Source: Ambulatory Visit | Attending: Nurse Practitioner | Admitting: Nurse Practitioner

## 2016-09-13 ENCOUNTER — Encounter (HOSPITAL_COMMUNITY): Payer: Self-pay | Admitting: Nurse Practitioner

## 2016-09-13 DIAGNOSIS — Z9842 Cataract extraction status, left eye: Secondary | ICD-10-CM | POA: Insufficient documentation

## 2016-09-13 DIAGNOSIS — Z91018 Allergy to other foods: Secondary | ICD-10-CM | POA: Insufficient documentation

## 2016-09-13 DIAGNOSIS — E039 Hypothyroidism, unspecified: Secondary | ICD-10-CM | POA: Diagnosis not present

## 2016-09-13 DIAGNOSIS — H269 Unspecified cataract: Secondary | ICD-10-CM | POA: Diagnosis not present

## 2016-09-13 DIAGNOSIS — I482 Chronic atrial fibrillation: Secondary | ICD-10-CM

## 2016-09-13 DIAGNOSIS — Z96611 Presence of right artificial shoulder joint: Secondary | ICD-10-CM | POA: Insufficient documentation

## 2016-09-13 DIAGNOSIS — Z96612 Presence of left artificial shoulder joint: Secondary | ICD-10-CM | POA: Insufficient documentation

## 2016-09-13 DIAGNOSIS — Z880 Allergy status to penicillin: Secondary | ICD-10-CM | POA: Diagnosis not present

## 2016-09-13 DIAGNOSIS — Z9841 Cataract extraction status, right eye: Secondary | ICD-10-CM | POA: Insufficient documentation

## 2016-09-13 DIAGNOSIS — Z7902 Long term (current) use of antithrombotics/antiplatelets: Secondary | ICD-10-CM | POA: Diagnosis not present

## 2016-09-13 DIAGNOSIS — Z96619 Presence of unspecified artificial shoulder joint: Secondary | ICD-10-CM | POA: Diagnosis not present

## 2016-09-13 DIAGNOSIS — Z87891 Personal history of nicotine dependence: Secondary | ICD-10-CM | POA: Diagnosis not present

## 2016-09-13 DIAGNOSIS — Z96641 Presence of right artificial hip joint: Secondary | ICD-10-CM | POA: Insufficient documentation

## 2016-09-13 DIAGNOSIS — M1990 Unspecified osteoarthritis, unspecified site: Secondary | ICD-10-CM | POA: Insufficient documentation

## 2016-09-13 DIAGNOSIS — Z96651 Presence of right artificial knee joint: Secondary | ICD-10-CM | POA: Diagnosis not present

## 2016-09-13 DIAGNOSIS — I4821 Permanent atrial fibrillation: Secondary | ICD-10-CM

## 2016-09-13 DIAGNOSIS — E0590 Thyrotoxicosis, unspecified without thyrotoxic crisis or storm: Secondary | ICD-10-CM | POA: Insufficient documentation

## 2016-09-13 MED ORDER — APIXABAN 5 MG PO TABS: 5.0000 mg | ORAL_TABLET | Freq: Two times a day (BID) | ORAL | 3 refills | Status: DC

## 2016-09-13 MED ORDER — METOPROLOL SUCCINATE ER 25 MG PO TB24: ORAL_TABLET | ORAL | 3 refills | Status: DC

## 2016-09-13 MED ORDER — DILTIAZEM HCL ER COATED BEADS 120 MG PO TB24: 120.0000 mg | ORAL_TABLET | Freq: Two times a day (BID) | ORAL | 3 refills | Status: DC

## 2016-09-13 NOTE — Progress Notes (Signed)
Patient ID: Katrina Velez, female   DOB: 12/08/1925, 83 y.o.   MRN: [id number]       Primary Care Physician: Kirt Boys, DO Referring Physician: Dr. Laqueta Jean Belknap is a 83 y.o. female with a h/o afib that was loaded on amiodarone and successfully cardioverted 4/13, but with ERAF. She did not see that much difference in SR and had some thyroid concerns  already on replacement, so decision was made to stop amiodarone and aim for rate control,HR has been staying below 100 at home. Her energy is good for her age and she is back to hanging clothes outside. No issues with anticoagulant.   Returns to afib clinic 8/23, after she has noticed at home some fluctuation in heart rate. Her daughter on taking her heart rate and Bp one day, noticed that hr heart rate was 48 bpm, although the pt did not feel bad. She talked to the on call provider and was advised to skip the next rate control drug. A few days later, the pt had a nonsustained episode of rvr at 150 bpm. She did feel lightheaded with that episode. On call provider was contacted and she was told to how to take extra rate control if needed. However after a few more minutes, her HR slowed down.  EKG showed afib with controlled afib. She is also upset because she feels that she needs more thyroid replacement. She has also felt better on a higher dose than what the labs suggest that she takes. Her synthroid was reduced several months back and the pt is c/o thinning hair, thinning nails, more fatigue, depression. She is planning to change to a new MD in the first of October to see if she can get straightened out with her thyroid.   Returns 9/6, holter monitor placed on previous visit and reviewed. Shows average HR around 107 bpm, high rate 160 bpm, minimum 40 bpm at 4:30 am. Pt was not aware of fast or slow heart rate.She could benefit from more rate control. She is still able to do her house work with minimal symptoms and feels that she has good rate  control.   F/u in the afib clinic 10/25 and has good rate control. Currently is on metoprolol ER 25 mg bid and cardizem 120 mg bid. Tried higher doses of BB but could not tolerate. She is tolerating this combination of rate control with HR in the 80's.   F/u in afib clinic,4/25, she is feeling well. Does not feel afib . No issues with shortness of breath or pedal edema. No bleeding issues with eliquis.  Today, she denies symptoms of palpitations, chest pain, shortness of breath, orthopnea, PND, lower extremity edema, dizziness, presyncope, syncope, or neurologic sequela. The patient is tolerating medications without difficulties and is otherwise without complaint today.   Past Medical History:  Diagnosis Date  . A-fib (HCC) 05/2015   HOSPITALIZED   . Arthritis   . Cataract   . Hypothyroidism   . Permanent atrial fibrillation (HCC)   . Thyroid disease    Past Surgical History:  Procedure Laterality Date  . CARDIOVERSION N/A 06/08/2015   Procedure: CARDIOVERSION;  Surgeon: Jake Bathe, MD;  Location: [facility] ENDOSCOPY;  Service: Cardiovascular;  Laterality: N/A;  . CARDIOVERSION N/A 09/02/2015   Procedure: CARDIOVERSION;  Surgeon: Laqueta Linden, MD;  Location: [facility] ENDOSCOPY;  Service: Cardiovascular;  Laterality: N/A;  . CATARACT EXTRACTION, BILATERAL Bilateral 2010  . JOINT REPLACEMENT    . KNEE SURGERY  2002  . right hip replacement    . RIGHT KNEE REPLACEMENT  2011  . TEE WITHOUT CARDIOVERSION N/A 06/08/2015   Procedure: TRANSESOPHAGEAL ECHOCARDIOGRAM (TEE);  Surgeon: Jake Bathe, MD;  Location: [facility] ENDOSCOPY;  Service: Cardiovascular;  Laterality: N/A;  . TOTAL HIP ARTHROPLASTY Right 2011  . TOTAL SHOULDER REPLACEMENT Right 2003  . TOTAL SHOULDER REPLACEMENT Left 2007  . WRIST SURGERY Bilateral 2008   both wrists    Current Outpatient Prescriptions  Medication Sig Dispense Refill  . apixaban (ELIQUIS) 5 MG TABS tablet Take 1 tablet (5 mg total) by mouth 2 (two) times daily.  180 tablet 3  . ascorbic acid (VITAMIN C) 500 MG tablet Take 1,000 mg by mouth daily.    Marland Kitchen diltiazem (CARDIZEM LA) 120 MG 24 hr tablet Take 1 tablet (120 mg total) by mouth 2 (two) times daily. 180 tablet 3  . furosemide (LASIX) 20 MG tablet TAKE 1 TABLET DAILY AS NEEDED FOR WEIGHT GAIN >3LBS. 30 tablet 3  . metoprolol succinate (TOPROL XL) 25 MG 24 hr tablet Take 1 tablet by mouth twice a day 180 tablet 3  . Multiple Vitamin (MULTIVITAMIN) capsule Take 1 capsule by mouth daily.    Marland Kitchen SYNTHROID 125 MCG tablet TAKE 1 TABLET (125 MCG TOTAL) BY MOUTH DAILY BEFORE BREAKFAST. 30 tablet 4  . traMADol (ULTRAM) 50 MG tablet One every 8 hours if needed for pain 50 tablet 3  . vitamin B-12 (CYANOCOBALAMIN) 1000 MCG tablet Take 1,000 mcg by mouth daily.    . vitamin E 400 UNIT capsule Take 400 Units by mouth daily.     No current facility-administered medications for this encounter.     Allergies  Allergen Reactions  . Banana Nausea Only    All banana products also  . Penicillins Hives    Social History   Social History  . Marital status: Widowed    Spouse name: N/A  . Number of children: N/A  . Years of education: N/A   Occupational History  . Not on file.   Social History Main Topics  . Smoking status: Former Smoker    Years: 64.00    Types: Cigarettes    Quit date: 05/28/2011  . Smokeless tobacco: Never Used  . Alcohol use 0.6 - 1.2 oz/week    1 - 2 Glasses of wine per week  . Drug use: No  . Sexual activity: Not on file   Other Topics Concern  . Not on file   Social History Narrative   DIET: NONE      DO YOU DRINK/EAT THINGS WITH CAFFEINE: YES, COFFEE AND TEA       MARITAL STATUS: WIDOWED      WHAT YEAR WERE YOU MARRIED:1950      DO YOU LIVE IN A HOUSE, APARTMENT, ASSISTED LIVING, CONDO TRAILER ETC.: HOUSE       IS IT ONE OR MORE STORIES: 2 STORIES      HOW MANY PERSONS LIVE IN YOUR HOME: 1      DO YOU HAVE PETS IN YOUR HOME: 1 CAT AND A PART TIME DOG        CURRENT OR PAST PROFESSION: CIVIL SERVANT      DO YOU EXERCISE: NO       WHAT TYPE AND HOW OFTEN:NO    No family history on file.  ROS- All systems are reviewed and negative except as per the HPI above  Physical Exam: Vitals:   09/13/16 1013  BP: 116/74  Pulse: 92  Weight: 154 lb 9.6 oz (70.1 kg)  Height:  (1.6 m)    GEN- The patient is well appearing, alert and oriented x 3 today.   Head- normocephalic, atraumatic Eyes-  Sclera clear, conjunctiva pink Ears- hearing intact Oropharynx- clear Neck- supple, no JVP Lymph- no cervical lymphadenopathy Lungs- Clear to ausculation bilaterally, normal work of breathing Heart- Irregular rate and rhythm, no murmurs, rubs or gallops, PMI not laterally displaced GI- soft, NT, ND, + BS Extremities- no clubbing, cyanosis, or edema MS- no significant deformity or atrophy Skin- no rash or lesion Psych- euthymic mood, full affect Neuro- strength and sensation are intact  EKG-afib at 92 bpm, qrs int 84 ms, qtc 445 ms Epic records reviewed Holter monitor reviewed  Assessment and Plan: 1.long standing permanent afib Appears to be doing well Continue metoprolol ER 25 mg bid and cardizem 120 mg bid, appears to have HR well controlled with this dose Continue eliquis 5 mg bid, Creatinine last checked 10/6 and stable at 0.97 ms, no bleeding issues Has physical with PCP May 11 and have asked that bmet is forwarded  2. Hypothyroidism  Per pcp By TSH reports, pt appears to be hyperthyroid, has recently seen two MD's who suggested that pt decrease amount of synthroid but she refuses, she states that she feels best when she is on higher doses of synthroid and is concerned with loss of hair and paper thin nails.  F/u 6 months  Elvina Sidle. [facility] [address] [place], [place] [address] [phone]

## 2016-09-29 ENCOUNTER — Ambulatory Visit (INDEPENDENT_AMBULATORY_CARE_PROVIDER_SITE_OTHER): Payer: Medicare Other

## 2016-09-29 VITALS — BP 122/80 | HR 78 | Temp 98.0°F | Ht 63.0 in | Wt 153.0 lb

## 2016-09-29 DIAGNOSIS — Z0000 Encounter for general adult medical examination without abnormal findings: Secondary | ICD-10-CM

## 2016-09-29 MED ORDER — TETANUS-DIPHTH-ACELL PERTUSSIS 5-2.5-18.5 LF-MCG/0.5 IM SUSP: 0.5000 mL | Freq: Once | INTRAMUSCULAR | 1 refills | Status: AC

## 2016-09-29 MED ORDER — TETANUS-DIPHTH-ACELL PERTUSSIS 5-2.5-18.5 LF-MCG/0.5 IM SUSP: 0.5000 mL | Freq: Once | INTRAMUSCULAR | Status: DC

## 2016-09-29 NOTE — Progress Notes (Signed)
Subjective:   Katrina Velez is a 83 y.o. female who presents for an Initial Medicare Annual Wellness Visit.     Objective:    Today's Vitals   09/29/16 1010  BP: 122/80  Pulse: 78  Temp: 98 F (36.7 C)  TempSrc: Oral  SpO2: 97%  Weight: 153 lb (69.4 kg)  Height: 5\' 3"  (1.6 m)  PainSc: 5    Body mass index is 27.1 kg/m.   Current Medications (verified) Outpatient Encounter Prescriptions as of 09/29/2016  Medication Sig  . apixaban (ELIQUIS) 5 MG TABS tablet Take 1 tablet (5 mg total) by mouth 2 (two) times daily.  Marland Kitchen. ascorbic acid (VITAMIN C) 500 MG tablet Take 1,000 mg by mouth daily.  . cholecalciferol (VITAMIN D) 1000 units tablet Take 1,000 Units by mouth daily.  Marland Kitchen. diltiazem (CARDIZEM LA) 120 MG 24 hr tablet Take 1 tablet (120 mg total) by mouth 2 (two) times daily.  . furosemide (LASIX) 20 MG tablet TAKE 1 TABLET DAILY AS NEEDED FOR WEIGHT GAIN >3LBS.  Marland Kitchen. metoprolol succinate (TOPROL XL) 25 MG 24 hr tablet Take 1 tablet by mouth twice a day  . Multiple Vitamin (MULTIVITAMIN) capsule Take 1 capsule by mouth daily.  Marland Kitchen. SYNTHROID 125 MCG tablet TAKE 1 TABLET (125 MCG TOTAL) BY MOUTH DAILY BEFORE BREAKFAST.  Marland Kitchen. traMADol (ULTRAM) 50 MG tablet One every 8 hours if needed for pain  . vitamin E 400 UNIT capsule Take 400 Units by mouth daily.  . [DISCONTINUED] vitamin B-12 (CYANOCOBALAMIN) 1000 MCG tablet Take 1,000 mcg by mouth daily.   No facility-administered encounter medications on file as of 09/29/2016.     Allergies (verified) Banana and Penicillins   History: Past Medical History:  Diagnosis Date  . A-fib (HCC) 05/2015   HOSPITALIZED   . Arthritis   . Cataract   . Hypothyroidism   . Permanent atrial fibrillation (HCC)   . Thyroid disease    Past Surgical History:  Procedure Laterality Date  . CARDIOVERSION N/A 06/08/2015   Procedure: CARDIOVERSION;  Surgeon: Jake BatheMark C Skains, MD;  [facility];  Service: Cardiovascular;  Laterality: N/A;  .  CARDIOVERSION N/A 09/02/2015   Procedure: CARDIOVERSION;  Surgeon: Laqueta LindenSuresh A Koneswaran, MD;  [facility];  Service: Cardiovascular;  Laterality: N/A;  . CATARACT EXTRACTION, BILATERAL Bilateral 2010  . JOINT REPLACEMENT    . KNEE SURGERY  2002  . right hip replacement    . RIGHT KNEE REPLACEMENT  2011  . TEE WITHOUT CARDIOVERSION N/A 06/08/2015   Procedure: TRANSESOPHAGEAL ECHOCARDIOGRAM (TEE);  Surgeon: Jake BatheMark C Skains, MD;  [facility];  Service: Cardiovascular;  Laterality: N/A;  . TOTAL HIP ARTHROPLASTY Right 2011  . TOTAL SHOULDER REPLACEMENT Right 2003  . TOTAL SHOULDER REPLACEMENT Left 2007  . WRIST SURGERY Bilateral 2008   both wrists   Family History  Problem Relation Age of Onset  . Hypercalcemia Daughter   . Hypertension Daughter    Social History   Occupational History  . Not on file.   Social History Main Topics  . Smoking status: Former Smoker    Packs/day: 1.00    Years: 65.00    Types: Cigarettes    Quit date: 05/28/2011  . Smokeless tobacco: Never Used  . Alcohol use 0.6 - 1.2 oz/week    1 - 2 Glasses of wine per week     Comment: occasional drink  . Drug use: No  . Sexual activity: Not on file    Tobacco Counseling Counseling  given: Not Answered   Activities of Daily Living In your present state of health, do you have any difficulty performing the following activities: 09/29/2016  Hearing? Y  Vision? N  Difficulty concentrating or making decisions? Y  Walking or climbing stairs? Y  Dressing or bathing? N  Doing errands, shopping? N  Preparing Food and eating ? N  Using the Toilet? N  In the past six months, have you accidently leaked urine? Y  Do you have problems with loss of bowel control? N  Managing your Medications? Y  Managing your Finances? Y  Housekeeping or managing your Housekeeping? Y  Some recent data might be hidden    Immunizations and Health Maintenance Immunization History  Administered Date(s)  Administered  . Influenza Split 03/25/2012  . Influenza,inj,Quad PF,36+ Mos 02/24/2013, 03/15/2015, 02/25/2016  . Influenza-Unspecified 05/01/2014  . Pneumococcal Conjugate-13 05/27/2014  . Pneumococcal Polysaccharide-23 02/25/2016  . Zoster 05/22/2001   Health Maintenance Due  Topic Date Due  . TETANUS/TDAP  08/05/1946  . DEXA SCAN  08/04/1992    Patient Care Team: Kirt Boys, DO as PCP - General (Internal Medicine) Sallye Lat, MD as Consulting Physician (Ophthalmology) Newman Nip, NP as Nurse Practitioner (Cardiology)  Indicate any recent Medical Services you may have received from other than [facility] providers in the past year (date may be approximate).     Assessment:   This is a routine wellness examination for Katrina Velez.   Hearing/Vision screen No exam data present  Dietary issues and exercise activities discussed: Current Exercise Habits: The patient does not participate in regular exercise at present (gardening and hanging clothes on line), Frequency (Times/Week): 7, Intensity: Mild, Exercise limited by: None identified  Goals    . Increase water intake          Starting today I will increase my water intake slowly day by day.       Depression Screen PHQ 2/9 Scores 09/29/2016 08/03/2015 06/05/2015 03/15/2015  PHQ - 2 Score 2 0 0 0  PHQ- 9 Score 6 - - -    Fall Risk Fall Risk  09/29/2016 03/01/2016 02/25/2016 08/03/2015 05/10/2015  Falls in the past year? No Yes No No No  Number falls in past yr: - 1 - - -  Injury with Fall? - No - - -    Cognitive Function: MMSE - Mini Mental State Exam 09/29/2016  Orientation to time 5  Orientation to Place 5  Registration 3  Attention/ Calculation 5  Recall 2  Language- name 2 objects 2  Language- repeat 1  Language- follow 3 step command 3  Language- read & follow direction 1  Write a sentence 1  Copy design 1  Total score 29        Screening Tests Health Maintenance  Topic Date Due  . TETANUS/TDAP   08/05/1946  . DEXA SCAN  08/04/1992  . INFLUENZA VACCINE  12/20/2016  . PNA vac Low Risk Adult  Completed      Plan:    I have personally reviewed and addressed the Medicare Annual Wellness questionnaire and have noted the following in the patient's chart:  A. Medical and social history B. Use of alcohol, tobacco or illicit drugs  C. Current medications and supplements D. Functional ability and status E.  Nutritional status F.  Physical activity G. Advance directives H. List of other physicians I.  Hospitalizations, surgeries, and ER visits in previous 12 months J.  Vitals K. Screenings to include hearing, vision, cognitive, depression L.  Referrals and appointments - none  In addition, I have reviewed and discussed with patient certain preventive protocols, quality metrics, and best practice recommendations. A written personalized care plan for preventive services as well as general preventive health recommendations were provided to patient.  See attached scanned questionnaire for additional information.   Signed,   Annetta Maw, RN Nurse Health Advisor

## 2016-09-29 NOTE — Addendum Note (Signed)
Addended by: Annetta MawGONTHIER, SARA E on: 09/29/2016 11:20 AM   Modules accepted: Orders

## 2016-09-29 NOTE — Progress Notes (Signed)
   I reviewed health advisor's note, was available for consultation and agree with the assessment and plan as written.  Will copy Dr. Montez Moritaarter about fatigue and rhinorrhea.  Damariz Paganelli L. Obert Espindola, D.O. Geriatrics [facility] [address] [place], [place] [address] Cell Phone (Mon-Fri 8am-5pm):  [phone] On Call:  [phone] & follow prompts after 5pm & weekends Office Phone:  [phone] Office Fax:  [phone]   Quick Notes   Health Maintenance: Tetanus vaccine due   Abnormal Screen: MMSE 29/30. Did not pass clock drawing. PHQ-6   Patient Concerns: Fatigue and nose is always running     Nurse Concerns: None

## 2016-09-29 NOTE — Patient Instructions (Signed)
[name] , Thank you for taking time to come for your Medicare Wellness Visit. I appreciate your ongoing commitment to your health goals. Please review the following plan we discussed and let me know if I can assist you in the future.   Screening recommendations/referrals: Colonoscopy up to date Recommended yearly ophthalmology/optometry visit for glaucoma screening and checkup Recommended yearly dental visit for hygiene and checkup  Vaccinations: Influenza vaccine up to date Pneumococcal vaccine up to date Tdap vaccine due. I will put in prescription Shingles vaccine up to date.  If you want the new vaccine let me know and I will put in prescription    Advanced directives: Please bring us a copy  Conditions/risks identified: None  Next appointment: Montez Moritaarter 5/16 @ 9:45 am  Preventive Care 65 Years and Older, Female Preventive care refers to lifestyle choices and visits with your health care provider that can promote health and wellness. What does preventive care include?  A yearly physical exam. This is also called an annual well check.  Dental exams once or twice a year.  Routine eye exams. Ask your health care provider how often you should have your eyes checked.  Personal lifestyle choices, including:  Daily care of your teeth and gums.  Regular physical activity.  Eating a healthy diet.  Avoiding tobacco and drug use.  Limiting alcohol use.  Practicing safe sex.  Taking low doses of aspirin every day.  Taking vitamin and mineral supplements as recommended by your health care provider. What happens during an annual well check? The services and screenings done by your health care provider during your annual well check will depend on your age, overall health, lifestyle risk factors, and family history of disease. Counseling  Your health care provider may ask you questions about your:  Alcohol use.  Tobacco use.  Drug use.  Emotional well-being.  Home and  relationship well-being.  Sexual activity.  Eating habits.  History of falls.  Memory and ability to understand (cognition).  Work and work Astronomerenvironment. Screening  You may have the following tests or measurements:  Height, weight, and BMI.  Blood pressure.  Lipid and cholesterol levels. These may be checked every 5 years, or more frequently if you are over 81 years old.  Skin check.  Lung cancer screening. You may have this screening every year starting at age 81 if you have a 30-pack-year history of smoking and currently smoke or have quit within the past 15 years.  Fecal occult blood test (FOBT) of the stool. You may have this test every year starting at age 81.  Flexible sigmoidoscopy or colonoscopy. You may have a sigmoidoscopy every 5 years or a colonoscopy every 10 years starting at age 81.  Prostate cancer screening. Recommendations will vary depending on your family history and other risks.  Hepatitis C blood test.  Hepatitis B blood test.  Sexually transmitted disease (STD) testing.  Diabetes screening. This is done by checking your blood sugar (glucose) after you have not eaten for a while (fasting). You may have this done every 1-3 years.  Abdominal aortic aneurysm (AAA) screening. You may need this if you are a current or former smoker.  Osteoporosis. You may be screened starting at age 81 if you are at high risk. Talk with your health care provider about your test results, treatment options, and if necessary, the need for more tests. Vaccines  Your health care provider may recommend certain vaccines, such as:  Influenza vaccine. This is recommended every  year.  Tetanus, diphtheria, and acellular pertussis (Tdap, Td) vaccine. You may need a Td booster every 10 years.  Zoster vaccine. You may need this after age 76.  Pneumococcal 13-valent conjugate (PCV13) vaccine. One dose is recommended after age 81.  Pneumococcal polysaccharide (PPSV23) vaccine. One  dose is recommended after age 81. Talk to your health care provider about which screenings and vaccines you need and how often you need them. This information is not intended to replace advice given to you by your health care provider. Make sure you discuss any questions you have with your health care provider. Document Released: 06/04/2015 Document Revised: 01/26/2016 Document Reviewed: 03/09/2015 Elsevier Interactive Patient Education  2017 [employer] Prevention in the Home Falls can cause injuries. They can happen to people of all ages. There are many things you can do to make your home safe and to help prevent falls. What can I do on the outside of my home?  Regularly fix the edges of walkways and driveways and fix any cracks.  Remove anything that might make you trip as you walk through a door, such as a raised step or threshold.  Trim any bushes or trees on the path to your home.  Use bright outdoor lighting.  Clear any walking paths of anything that might make someone trip, such as rocks or tools.  Regularly check to see if handrails are loose or broken. Make sure that both sides of any steps have handrails.  Any raised decks and porches should have guardrails on the edges.  Have any leaves, snow, or ice cleared regularly.  Use sand or salt on walking paths during winter.  Clean up any spills in your garage right away. This includes oil or grease spills. What can I do in the bathroom?  Use night lights.  Install grab bars by the toilet and in the tub and shower. Do not use towel bars as grab bars.  Use non-skid mats or decals in the tub or shower.  If you need to sit down in the shower, use a plastic, non-slip stool.  Keep the floor dry. Clean up any water that spills on the floor as soon as it happens.  Remove soap buildup in the tub or shower regularly.  Attach bath mats securely with double-sided non-slip rug tape.  Do not have throw rugs and other  things on the floor that can make you trip. What can I do in the bedroom?  Use night lights.  Make sure that you have a light by your bed that is easy to reach.  Do not use any sheets or blankets that are too big for your bed. They should not hang down onto the floor.  Have a firm chair that has side arms. You can use this for support while you get dressed.  Do not have throw rugs and other things on the floor that can make you trip. What can I do in the kitchen?  Clean up any spills right away.  Avoid walking on wet floors.  Keep items that you use a lot in easy-to-reach places.  If you need to reach something above you, use a strong step stool that has a grab bar.  Keep electrical cords out of the way.  Do not use floor polish or wax that makes floors slippery. If you must use wax, use non-skid floor wax.  Do not have throw rugs and other things on the floor that can make you trip. What can  I do with my stairs?  Do not leave any items on the stairs.  Make sure that there are handrails on both sides of the stairs and use them. Fix handrails that are broken or loose. Make sure that handrails are as long as the stairways.  Check any carpeting to make sure that it is firmly attached to the stairs. Fix any carpet that is loose or worn.  Avoid having throw rugs at the top or bottom of the stairs. If you do have throw rugs, attach them to the floor with carpet tape.  Make sure that you have a light switch at the top of the stairs and the bottom of the stairs. If you do not have them, ask someone to add them for you. What else can I do to help prevent falls?  Wear shoes that:  Do not have high heels.  Have rubber bottoms.  Are comfortable and fit you well.  Are closed at the toe. Do not wear sandals.  If you use a stepladder:  Make sure that it is fully opened. Do not climb a closed stepladder.  Make sure that both sides of the stepladder are locked into place.  Ask  someone to hold it for you, if possible.  Clearly mark and make sure that you can see:  Any grab bars or handrails.  First and last steps.  Where the edge of each step is.  Use tools that help you move around (mobility aids) if they are needed. These include:  Canes.  Walkers.  Scooters.  Crutches.  Turn on the lights when you go into a dark area. Replace any light bulbs as soon as they burn out.  Set up your furniture so you have a clear path. Avoid moving your furniture around.  If any of your floors are uneven, fix them.  If there are any pets around you, be aware of where they are.  Review your medicines with your doctor. Some medicines can make you feel dizzy. This can increase your chance of falling. Ask your doctor what other things that you can do to help prevent falls. This information is not intended to replace advice given to you by your health care provider. Make sure you discuss any questions you have with your health care provider. Document Released: 03/04/2009 Document Revised: 10/14/2015 Document Reviewed: 06/12/2014 Elsevier Interactive Patient Education  2017 [employer].

## 2016-09-29 NOTE — Addendum Note (Signed)
Addended by: Annetta MawGONTHIER, SARA E on: 09/29/2016 11:25 AM   Modules accepted: Orders

## 2016-10-04 ENCOUNTER — Encounter: Payer: Self-pay | Admitting: Internal Medicine

## 2016-10-04 ENCOUNTER — Ambulatory Visit (INDEPENDENT_AMBULATORY_CARE_PROVIDER_SITE_OTHER): Payer: Medicare Other | Admitting: Internal Medicine

## 2016-10-04 VITALS — BP 124/60 | HR 88 | Temp 98.0°F | Ht 63.0 in | Wt 152.8 lb

## 2016-10-04 DIAGNOSIS — E034 Atrophy of thyroid (acquired): Secondary | ICD-10-CM | POA: Diagnosis not present

## 2016-10-04 DIAGNOSIS — M1991 Primary osteoarthritis, unspecified site: Secondary | ICD-10-CM | POA: Diagnosis not present

## 2016-10-04 DIAGNOSIS — E0580 Other thyrotoxicosis without thyrotoxic crisis or storm: Secondary | ICD-10-CM | POA: Diagnosis not present

## 2016-10-04 DIAGNOSIS — I482 Chronic atrial fibrillation: Secondary | ICD-10-CM | POA: Diagnosis not present

## 2016-10-04 DIAGNOSIS — J301 Allergic rhinitis due to pollen: Secondary | ICD-10-CM

## 2016-10-04 DIAGNOSIS — R200 Anesthesia of skin: Secondary | ICD-10-CM

## 2016-10-04 DIAGNOSIS — Z79899 Other long term (current) drug therapy: Secondary | ICD-10-CM | POA: Diagnosis not present

## 2016-10-04 DIAGNOSIS — I4821 Permanent atrial fibrillation: Secondary | ICD-10-CM

## 2016-10-04 DIAGNOSIS — R202 Paresthesia of skin: Secondary | ICD-10-CM | POA: Diagnosis not present

## 2016-10-04 LAB — CBC WITH DIFFERENTIAL/PLATELET
BASOS ABS: 62 {cells}/uL (ref 0–200)
BASOS PCT: 1 %
EOS ABS: 186 {cells}/uL (ref 15–500)
Eosinophils Relative: 3 %
HEMATOCRIT: 43.8 % (ref 35.0–45.0)
Hemoglobin: 14.4 g/dL (ref 11.7–15.5)
LYMPHS PCT: 18 %
Lymphs Abs: 1116 cells/uL (ref 850–3900)
MCH: 30.1 pg (ref 27.0–33.0)
MCHC: 32.9 g/dL (ref 32.0–36.0)
MCV: 91.6 fL (ref 80.0–100.0)
MONO ABS: 682 {cells}/uL (ref 200–950)
MONOS PCT: 11 %
MPV: 10.6 fL (ref 7.5–12.5)
NEUTROS PCT: 67 %
Neutro Abs: 4154 cells/uL (ref 1500–7800)
Platelets: 342 10*3/uL (ref 140–400)
RBC: 4.78 MIL/uL (ref 3.80–5.10)
RDW: 14.1 % (ref 11.0–15.0)
WBC: 6.2 10*3/uL (ref 3.8–10.8)

## 2016-10-04 LAB — COMPLETE METABOLIC PANEL WITH GFR
ALBUMIN: 4.2 g/dL (ref 3.6–5.1)
ALK PHOS: 68 U/L (ref 33–130)
ALT: 8 U/L (ref 6–29)
AST: 13 U/L (ref 10–35)
BILIRUBIN TOTAL: 0.7 mg/dL (ref 0.2–1.2)
BUN: 19 mg/dL (ref 7–25)
CALCIUM: 10 mg/dL (ref 8.6–10.4)
CO2: 30 mmol/L (ref 20–31)
CREATININE: 0.87 mg/dL (ref 0.60–0.88)
Chloride: 102 mmol/L (ref 98–110)
GFR, Est African American: 68 mL/min (ref >=60)
GFR, Est Non African American: 59 mL/min — ABNORMAL LOW (ref >=60)
GLUCOSE: 89 mg/dL (ref 65–99)
Potassium: 4.7 mmol/L (ref 3.5–5.3)
SODIUM: 139 mmol/L (ref 135–146)
TOTAL PROTEIN: 6.7 g/dL (ref 6.1–8.1)

## 2016-10-04 LAB — URINALYSIS, ROUTINE W REFLEX MICROSCOPIC
BILIRUBIN URINE: NEGATIVE
GLUCOSE, UA: NEGATIVE
HGB URINE DIPSTICK: NEGATIVE
Ketones, ur: NEGATIVE
Nitrite: NEGATIVE
Protein, ur: NEGATIVE
Specific Gravity, Urine: 1.019 (ref 1.001–1.035)
pH: 6 (ref 5.0–8.0)

## 2016-10-04 LAB — URINALYSIS, MICROSCOPIC ONLY
Casts: NONE SEEN [LPF]
Crystals: NONE SEEN [HPF]
RBC / HPF: NONE SEEN RBC/HPF (ref <=2)
YEAST: NONE SEEN [HPF]

## 2016-10-04 LAB — TSH: TSH: 0.09 m[IU]/L — AB

## 2016-10-04 LAB — T4, FREE: Free T4: 1.8 ng/dL (ref 0.8–1.8)

## 2016-10-04 NOTE — Progress Notes (Addendum)
Patient ID: Katrina Velez, female   DOB: 12/30/1925, 83 y.o.   MRN: [id number]   Location:   [facility]   Place of Service:   OFFICE Provider: DR Arletha Grippe  Patient Care Team: Gildardo Cranker, DO as PCP - General (Internal Medicine) Warden Fillers, MD as Consulting Physician (Ophthalmology) Sherran Needs, NP as Nurse Practitioner (Cardiology)  Extended Emergency Contact Information Primary Emergency Contact: [name] Address: [place], [place] [address] Johnnette Litter of [place] Phone: [phone] Relation: Daughter Secondary Emergency Contact: [name] [place] of [place] Phone: [phone] Mobile Phone: [phone] Relation: Son  Code Status: FULL Goals of Care: Advanced Directive information Advanced Directives 10/04/2016  Does Patient Have a Medical Advance Directive? No  Would patient like information on creating a medical advance directive? No - Patient declined  Pre-existing out of facility DNR order (yellow form or pink MOST form) -    Chief Complaint  Patient presents with  . Medical Management of Chronic Issues    EXTENDED VISIT    HPI: Patient is a 83 y.o. female seen in today for an extended visit. She has right nostril pain and reports constant rhinorrhea b/l nostrils. She takes claritin daily. She does have post nasal drip, scratchy throat. No f/c, HA or dizziness.   PAF - rate controlled on cardizem LA and metoprolol succ. She takes eliquis for anticoagulation. Followed by cardiology. She had a successful cardioversion in Arril 2017 but later reverted back to afib. She was hospitalized in Jan 2017 for afib w RVR, acute CHF and acute pulmonary edema. 2D echo showed EF 50-55%. Her last ECG in Apr 2018 showed afib.  Hx CHF - takes lasix daily prn "feeling bloated". Followed by cardiology  COPD/hx tobacco abuse - stable off meds. She no longer smokes  Arthritis - stable. Her pain is improved on tramadol prn and  also uses topical OTC lidocaine on wrists for carpal tunnel discomfort. She has taken ASA in the past but was told not to combine it with eliquis. She is intolerant of tylenol.   Hypothyroidism - takes brand name only synthroid 173mg daily. TSH 0.19; free T4 1.81. She no longer sees Endo. She is taking med fasting and 1st thing in the AM.   [facility] 10/04/2016 09/29/2016 08/03/2015 06/05/2015 03/15/2015  Decreased Interest 1 1 0 0 0  Down, Depressed, Hopeless 1 1 0 0 0  PHQ - 2 Score 2 2 0 0 0  Altered sleeping - 1 - - -  Tired, decreased energy - 3 - - -  Change in appetite - 0 - - -  Feeling bad or failure about yourself  - 0 - - -  Trouble concentrating - 0 - - -  Moving slowly or fidgety/restless - 0 - - -  Suicidal thoughts - 0 - - -  PHQ-9 Score - 6 - - -  Difficult doing work/chores - Somewhat difficult - - -    Fall Risk  10/04/2016 09/29/2016 03/01/2016 02/25/2016 08/03/2015  Falls in the past year? No No Yes No No  Number falls in past yr: - - 1 - -  Injury with Fall? - - No - -   MMSE - Mini Mental State Exam 09/29/2016  Orientation to time 5  Orientation to Place 5  Registration 3  Attention/ Calculation 5  Recall 2  Language- name 2 objects 2  Language- repeat 1  Language-  follow 3 step command 3  Language- read & follow direction 1  Write a sentence 1  Copy design 1  Total score 29    Health Maintenance  Topic Date Due  . TETANUS/TDAP  08/05/1946  . INFLUENZA VACCINE  12/20/2016  . DEXA SCAN  Completed  . PNA vac Low Risk Adult  Completed   REVIEWED AWV NOTE FROM 09/29/16. She has problems hearing   Past Medical History:  Diagnosis Date  . A-fib ([place]) 05/2015   HOSPITALIZED   . Arthritis   . Cataract   . Hypothyroidism   . Permanent atrial fibrillation ([place])   . Thyroid disease     Past Surgical History:  Procedure Laterality Date  . CARDIOVERSION N/A 06/08/2015   Procedure: CARDIOVERSION;  Surgeon: Jerline Pain, MD;  [facility]  ENDOSCOPY;  Service: Cardiovascular;  Laterality: N/A;  . CARDIOVERSION N/A 09/02/2015   Procedure: CARDIOVERSION;  Surgeon: Herminio Commons, MD;  Location: [place];  Service: Cardiovascular;  Laterality: N/A;  . CATARACT EXTRACTION, BILATERAL Bilateral 2010  . JOINT REPLACEMENT    . KNEE SURGERY  2002  . right hip replacement    . RIGHT KNEE REPLACEMENT  2011  . TEE WITHOUT CARDIOVERSION N/A 06/08/2015   Procedure: TRANSESOPHAGEAL ECHOCARDIOGRAM (TEE);  Surgeon: Jerline Pain, MD;  Location: [place];  Service: Cardiovascular;  Laterality: N/A;  . TOTAL HIP ARTHROPLASTY Right 2011  . TOTAL SHOULDER REPLACEMENT Right 2003  . TOTAL SHOULDER REPLACEMENT Left 2007  . WRIST SURGERY Bilateral 2008   both wrists    Family History  Problem Relation Age of Onset  . Hypercalcemia Daughter   . Hypertension Daughter     Social History   Social History  . Marital status: Widowed    Spouse name: N/A  . Number of children: N/A  . Years of education: N/A   Social History Main Topics  . Smoking status: Former Smoker    Packs/day: 1.00    Years: 65.00    Types: Cigarettes    Quit date: 05/28/2011  . Smokeless tobacco: Never Used  . Alcohol use 0.6 - 1.2 oz/week    1 - 2 Glasses of wine per week     Comment: occasional drink  . Drug use: No  . Sexual activity: Not Asked   Other Topics Concern  . None   Social History Narrative   DIET: NONE      DO YOU DRINK/EAT THINGS WITH CAFFEINE: YES, COFFEE AND TEA       MARITAL STATUS: WIDOWED      WHAT YEAR WERE YOU MARRIED:1950      DO YOU LIVE IN A HOUSE, APARTMENT, ASSISTED LIVING, CONDO TRAILER ETC.: HOUSE       IS IT ONE OR MORE STORIES: 2 STORIES      HOW MANY PERSONS LIVE IN YOUR HOME: 1      DO YOU HAVE PETS IN YOUR HOME: 1 CAT AND A PART TIME DOG       CURRENT OR PAST PROFESSION: CIVIL SERVANT      DO YOU EXERCISE: NO       WHAT TYPE AND HOW OFTEN:NO    reports that she quit smoking about 5 years ago. Her  smoking use included Cigarettes. She has a 65.00 pack-year smoking history. She has never used smokeless tobacco. She reports that she drinks about 0.6 - 1.2 oz of alcohol per week . She reports that she does not use drugs.   Allergies  Allergen Reactions  . Banana Nausea Only    All banana products also  . Penicillins Hives    Allergies as of 10/04/2016      Reactions   Banana Nausea Only   All banana products also   Penicillins Hives      Medication List       Accurate as of 10/04/16 11:59 PM. Always use your most recent med list.          apixaban 5 MG Tabs tablet Commonly known as:  ELIQUIS Take 1 tablet (5 mg total) by mouth 2 (two) times daily.   ascorbic acid 500 MG tablet Commonly known as:  VITAMIN C Take 1,000 mg by mouth daily.   cholecalciferol 1000 units tablet Commonly known as:  VITAMIN D Take 1,000 Units by mouth daily.   diltiazem 120 MG 24 hr tablet Commonly known as:  CARDIZEM LA Take 1 tablet (120 mg total) by mouth 2 (two) times daily.   furosemide 20 MG tablet Commonly known as:  LASIX TAKE 1 TABLET DAILY AS NEEDED FOR WEIGHT GAIN >3LBS.   metoprolol succinate 25 MG 24 hr tablet Commonly known as:  TOPROL XL Take 1 tablet by mouth twice a day   multivitamin capsule Take 1 capsule by mouth daily.   SYNTHROID 125 MCG tablet Generic drug:  levothyroxine TAKE 1 TABLET (125 MCG TOTAL) BY MOUTH DAILY BEFORE BREAKFAST.   traMADol 50 MG tablet Commonly known as:  ULTRAM One every 8 hours if needed for pain   vitamin E 400 UNIT capsule Take 400 Units by mouth daily.        Review of Systems:  Review of Systems  HENT: Positive for postnasal drip and rhinorrhea.   All other systems reviewed and are negative.   Physical Exam: Vitals:   10/04/16 1023  BP: 124/60  Pulse: 88  Temp: 98 F (36.7 C)  TempSrc: Oral  SpO2: 94%  Weight: 152 lb 12.8 oz (69.3 kg)  Height: 5' 3"  (1.6 m)   Body mass index is 27.07 kg/m. Physical Exam    Constitutional: She is oriented to person, place, and time. She appears well-developed and well-nourished. No distress.  HENT:  Head: Normocephalic and atraumatic.  Right Ear: Hearing, tympanic membrane, external ear and ear canal normal.  Left Ear: Hearing, tympanic membrane, external ear and ear canal normal.  Mouth/Throat: Uvula is midline, oropharynx is clear and moist and mucous membranes are normal. She does not have dentures. No oropharyngeal exudate.  Nares intact with dry grey turbinates; no sinus TTP  Eyes: Conjunctivae, EOM and lids are normal. Pupils are equal, round, and reactive to light. No scleral icterus.  Neck: Trachea normal and normal range of motion. Neck supple. Carotid bruit is not present. No tracheal deviation present. No thyroid mass and no thyromegaly present.  Cardiovascular: Normal rate and intact distal pulses.  An irregularly irregular rhythm present. Exam reveals no gallop and no friction rub.   Murmur heard.  Systolic murmur is present with a grade of 1/6  Trace LE edema b/l. No calf TTP  Pulmonary/Chest: Effort normal and breath sounds normal. No stridor. No respiratory distress. She has no wheezes. She has no rhonchi. She has no rales. Right breast exhibits no inverted nipple, no mass, no nipple discharge, no skin change and no tenderness. Left breast exhibits no inverted nipple, no mass, no nipple discharge, no skin change and no tenderness. Breasts are symmetrical.  Abdominal: Soft. Normal appearance, normal aorta and bowel sounds  are normal. She exhibits no distension, no pulsatile midline mass and no mass. There is no hepatosplenomegaly or hepatomegaly. There is no tenderness. There is no rigidity, no rebound and no guarding. No hernia.  Musculoskeletal: Normal range of motion. She exhibits edema.  Lymphadenopathy:       Head (right side): No posterior auricular adenopathy present.       Head (left side): No posterior auricular adenopathy present.    She  has no cervical adenopathy.       Right: No supraclavicular adenopathy present.       Left: No supraclavicular adenopathy present.  Neurological: She is alert and oriented to person, place, and time. She has normal strength and normal reflexes. No cranial nerve deficit. Gait normal.  sensation to vibration intact in LE b/l  Skin: Skin is warm, dry and intact. No rash noted. Nails show no clubbing.  Thin hair  Psychiatric: She has a normal mood and affect. Her speech is normal and behavior is normal. Judgment and thought content normal. Cognition and memory are normal.    Labs reviewed: Basic Metabolic Panel:  Recent Labs  12/17/15 1009 02/25/16 1217 10/04/16 1124  NA  --  139 139  K  --  5.2 4.7  CL  --  102 102  CO2  --  29 30  GLUCOSE  --  93 89  BUN  --  16 19  CREATININE  --  0.97* 0.87  CALCIUM  --  10.1 10.0  TSH 0.17* 0.19* 0.09*   Liver Function Tests:  Recent Labs  02/25/16 1217 10/04/16 1124  AST 17 13  ALT 7 8  ALKPHOS 66 68  BILITOT 0.6 0.7  PROT 6.8 6.7  ALBUMIN 4.0 4.2   No results for input(s): LIPASE, AMYLASE in the last 8760 hours. No results for input(s): AMMONIA in the last 8760 hours. CBC:  Recent Labs  10/04/16 1124  WBC 6.2  NEUTROABS 4,154  HGB 14.4  HCT 43.8  MCV 91.6  PLT 342   Lipid Panel: No results for input(s): CHOL, HDL, LDLCALC, TRIG, CHOLHDL, LDLDIRECT in the last 8760 hours. No results found for: HGBA1C  Procedures: No results found.  Assessment/Plan   ICD-9-CM ICD-10-CM   1. Seasonal allergic rhinitis due to pollen 477.0 J30.1   2. Numbness and tingling of left leg 782.0 R20.0 CMP with eGFR    R20.2   3. Permanent atrial fibrillation (HCC) 427.31 I48.2 CMP with eGFR     Urinalysis with Reflex Microscopic  4. Hypothyroidism due to acquired atrophy of thyroid 244.8 E03.4 TSH   246.8  T4, Free   with iatrogenic hyperthyroidism; pt declines medication adjustment  5. Iatrogenic hyperthyroidism 242.80 E05.80 T4, Free   6. Primary osteoarthritis, unspecified site 715.10 M19.91   7. High risk medication use V58.69 Z79.899 CMP with eGFR     CBC with Differential/Platelets     Urinalysis with Reflex Microscopic     Insurance will not cover lipid panel at this time  Recommend saline nasal spray OTC as needed to keep nose moist  Call if numbness worsens. May need further testing  Continue current medications as ordered  Will call with lab results  Follow up with specialists as scheduled  Follow up in 4 mos for thyroid, numbness in left leg, arthritis  HANDOUT "keeping you healthy" given  Evone Arseneau S. Perlie Gold  [facility] [address] [place], [place] [address] [phone] Cell (  Monday-Friday 8 AM - 5 PM) [phone] After 5 PM and follow prompts

## 2016-10-04 NOTE — Patient Instructions (Addendum)
Recommend saline nasal spray OTC as needed to keep nose moist  Call if numbness worsens. May need further testing  Continue current medications as ordered  Will call with lab results  Follow up with specialists as scheduled  Follow up in 4 mos for thyroid, numbness in left leg, arthritis   Keeping You Healthy  Get These Tests  Blood Pressure- Have your blood pressure checked by your healthcare provider at least once a year.  Normal blood pressure is 120/80.  Weight- Have your body mass index (BMI) calculated to screen for obesity.  BMI is a measure of body fat based on height and weight.  You can calculate your own BMI at https://www.west-esparza.com/  Cholesterol- Have your cholesterol checked every year.  Diabetes- Have your blood sugar checked every year if you have high blood pressure, high cholesterol, a family history of diabetes or if you are overweight.  Pap Test - Have a pap test every 1 to 5 years if you have been sexually active.  If you are older than 65 and recent pap tests have been normal you may not need additional pap tests.  In addition, if you have had a hysterectomy  for benign disease additional pap tests are not necessary.  Mammogram-Yearly mammograms are essential for early detection of breast cancer  Screening for Colon Cancer- Colonoscopy starting at age 11. Screening may begin sooner depending on your family history and other health conditions.  Follow up colonoscopy as directed by your Gastroenterologist.  Screening for Osteoporosis- Screening begins at age 23 with bone density scanning, sooner if you are at higher risk for developing Osteoporosis.  Get these medicines  Calcium with Vitamin D- Your body requires 1200-1500 mg of Calcium a day and [phone] IU of Vitamin D a day.  You can only absorb 500 mg of Calcium at a time therefore Calcium must be taken in 2 or 3 separate doses throughout the day.  Hormones- Hormone therapy has been associated with  increased risk for certain cancers and heart disease.  Talk to your healthcare provider about if you need relief from menopausal symptoms.  Aspirin- Not recommended for you as you already take Eliquis  Get these Immuniztions  Flu shot- Every fall  Pneumonia shot- Once after the age of 18; if you are younger ask your healthcare provider if you need a pneumonia shot.  Tetanus- Every ten years.  Zostavax- Once after the age of 48 to prevent shingles.  Take these steps  Don't smoke- Your healthcare provider can help you quit. For tips on how to quit, ask your healthcare provider or go to www.smokefree.gov or call 1-800 QUIT-NOW.  Be physically active- Exercise 5 days a week for a minimum of 30 minutes.  If you are not already physically active, start slow and gradually work up to 30 minutes of moderate physical activity.  Try walking, dancing, bike riding, swimming, etc.  Eat a healthy diet- Eat a variety of healthy foods such as fruits, vegetables, whole grains, low fat milk, low fat cheeses, yogurt, lean meats, chicken, fish, eggs, dried beans, tofu, etc.  For more information go to www.thenutritionsource.org  Dental visit- Brush and floss teeth twice daily; visit your dentist twice a year.  Eye exam- Visit your Optometrist or Ophthalmologist yearly.  Drink alcohol in moderation- Limit alcohol intake to one drink or less a day.  Never drink and drive.  Depression- Your emotional health is as important as your physical health.  If you're feeling down or losing  interest in things you normally enjoy, please talk to your healthcare provider.  Seat Belts- can save your life; always wear one  Smoke/Carbon Monoxide detectors- These detectors need to be installed on the appropriate level of your home.  Replace batteries at least once a year.  Violence- If anyone is threatening or hurting you, please tell your healthcare provider.  Living Will/ Health care power of attorney- Discuss with  your healthcare provider and family.

## 2016-10-06 NOTE — Addendum Note (Signed)
Addended by: Kirt BoysARTER, Ulla Mckiernan on: 10/06/2016 04:53 PM   Modules accepted: Level of Service

## 2016-10-27 ENCOUNTER — Other Ambulatory Visit: Payer: Self-pay | Admitting: Internal Medicine

## 2016-11-02 ENCOUNTER — Telehealth (HOSPITAL_COMMUNITY): Payer: Self-pay | Admitting: *Deleted

## 2016-11-02 NOTE — Telephone Encounter (Signed)
Pt called reporting discoloration of feet to ankles with a rash, no pain, itching or swelling, came up over night.  Wanted to know if it was related to the eliquis.  Pt was advised per symptoms, Rudi Cocoonna Carroll, NP does not feel it to be related to the eliquis and that pt should contact her PCP or atleast speak to one of their triage nurses to get an appt or advice.  Pt stated that she was not really worried about it.  She will watch to see if it changes

## 2016-12-01 ENCOUNTER — Other Ambulatory Visit: Payer: Self-pay | Admitting: Internal Medicine

## 2016-12-01 DIAGNOSIS — M1991 Primary osteoarthritis, unspecified site: Secondary | ICD-10-CM

## 2017-01-03 ENCOUNTER — Encounter: Payer: Self-pay | Admitting: Internal Medicine

## 2017-01-03 ENCOUNTER — Ambulatory Visit (INDEPENDENT_AMBULATORY_CARE_PROVIDER_SITE_OTHER): Payer: Medicare Other | Admitting: Internal Medicine

## 2017-01-03 VITALS — BP 126/80 | HR 96 | Temp 97.7°F | Ht 63.0 in | Wt 147.0 lb

## 2017-01-03 DIAGNOSIS — G8929 Other chronic pain: Secondary | ICD-10-CM | POA: Diagnosis not present

## 2017-01-03 DIAGNOSIS — R200 Anesthesia of skin: Secondary | ICD-10-CM | POA: Diagnosis not present

## 2017-01-03 DIAGNOSIS — R202 Paresthesia of skin: Secondary | ICD-10-CM

## 2017-01-03 DIAGNOSIS — J301 Allergic rhinitis due to pollen: Secondary | ICD-10-CM | POA: Diagnosis not present

## 2017-01-03 DIAGNOSIS — E034 Atrophy of thyroid (acquired): Secondary | ICD-10-CM

## 2017-01-03 DIAGNOSIS — M5442 Lumbago with sciatica, left side: Secondary | ICD-10-CM | POA: Diagnosis not present

## 2017-01-03 DIAGNOSIS — I4821 Permanent atrial fibrillation: Secondary | ICD-10-CM

## 2017-01-03 DIAGNOSIS — I482 Chronic atrial fibrillation: Secondary | ICD-10-CM | POA: Diagnosis not present

## 2017-01-03 LAB — T4, FREE: FREE T4: 1.6 ng/dL (ref 0.8–1.8)

## 2017-01-03 LAB — TSH: TSH: 0.12 mIU/L — ABNORMAL LOW

## 2017-01-03 MED ORDER — ZOSTER VAC RECOMB ADJUVANTED 50 MCG/0.5ML IM SUSR: 0.5000 mL | Freq: Once | INTRAMUSCULAR | 1 refills | Status: AC

## 2017-01-03 MED ORDER — METHYLPREDNISOLONE 4 MG PO TBPK: ORAL_TABLET | ORAL | 0 refills | Status: DC

## 2017-01-03 MED ORDER — FLUTICASONE PROPIONATE 50 MCG/ACT NA SUSP: 1.0000 | Freq: Every day | NASAL | 6 refills | Status: DC

## 2017-01-03 MED ORDER — GABAPENTIN 100 MG PO CAPS: 100.0000 mg | ORAL_CAPSULE | Freq: Every day | ORAL | 3 refills | Status: DC

## 2017-01-03 NOTE — Progress Notes (Signed)
Patient ID: Katrina Velez, female   DOB: 30-Jul-1925, 83 y.o.   MRN: [id number]    Location:  [facility] Place of Service: OFFICE  Chief Complaint  Patient presents with  . Acute Visit    Dizzy, numbness in feet and overall not feeling well x 1 months. Here with Izora Gala (Daughter)   . Medication Refill    No refills needed     HPI:  83 yo female seen today for dizziness. She has hx allergic rhinitis and takes claritin daily. She has constant rhinorrhea that occurs at sporadic times during the day. She has a cat. No HA, sinus pressure, ear pressure. No f/c. She has hoarseness, constantly clearing throat, dry itchy eyes.  She also c/o LLE numbness. She had similar c/o at [facility]. She has weakness in LLE. She is unsure where she is stepping. No falls. She feels unbalanced. No loss of bowel/bladder control  She has SOB with ambulation. She has hx afib. She has chronic fatigue  Past Medical History:  Diagnosis Date  . A-fib ([place]) 05/2015   HOSPITALIZED   . Arthritis   . Cataract   . Hypothyroidism   . Permanent atrial fibrillation ([place])   . Thyroid disease     Past Surgical History:  Procedure Laterality Date  . CARDIOVERSION N/A 06/08/2015   Procedure: CARDIOVERSION;  Surgeon: Jerline Pain, MD;  Location: [facility] ENDOSCOPY;  Service: Cardiovascular;  Laterality: N/A;  . CARDIOVERSION N/A 09/02/2015   Procedure: CARDIOVERSION;  Surgeon: Herminio Commons, MD;  Location: [place];  Service: Cardiovascular;  Laterality: N/A;  . CATARACT EXTRACTION, BILATERAL Bilateral 2010  . JOINT REPLACEMENT    . KNEE SURGERY  2002  . right hip replacement    . RIGHT KNEE REPLACEMENT  2011  . TEE WITHOUT CARDIOVERSION N/A 06/08/2015   Procedure: TRANSESOPHAGEAL ECHOCARDIOGRAM (TEE);  Surgeon: Jerline Pain, MD;  Location: [place];  Service: Cardiovascular;  Laterality: N/A;  . TOTAL HIP ARTHROPLASTY Right 2011  . TOTAL SHOULDER REPLACEMENT Right 2003  . TOTAL SHOULDER REPLACEMENT Left 2007  . WRIST  SURGERY Bilateral 2008   both wrists    Patient Care Team: Gildardo Cranker, DO as PCP - General (Internal Medicine) Warden Fillers, MD as Consulting Physician (Ophthalmology) Sherran Needs, NP as Nurse Practitioner (Cardiology)  Social History   Social History  . Marital status: Widowed    Spouse name: N/A  . Number of children: N/A  . Years of education: N/A   Occupational History  . Not on file.   Social History Main Topics  . Smoking status: Former Smoker    Packs/day: 1.00    Years: 65.00    Types: Cigarettes    Quit date: 05/28/2011  . Smokeless tobacco: Never Used  . Alcohol use 0.6 - 1.2 oz/week    1 - 2 Glasses of wine per week     Comment: occasional drink  . Drug use: No  . Sexual activity: Not on file   Other Topics Concern  . Not on file   Social History Narrative   DIET: NONE      DO YOU DRINK/EAT THINGS WITH CAFFEINE: YES, COFFEE AND TEA       MARITAL STATUS: WIDOWED      WHAT YEAR WERE YOU MARRIED:1950      DO YOU LIVE IN A HOUSE, APARTMENT, ASSISTED LIVING, CONDO TRAILER ETC.: HOUSE       IS IT ONE OR MORE STORIES: 2 STORIES  HOW MANY PERSONS LIVE IN YOUR HOME: 1      DO YOU HAVE PETS IN YOUR HOME: 1 CAT AND A PART TIME DOG       CURRENT OR PAST PROFESSION: CIVIL SERVANT      DO YOU EXERCISE: NO       WHAT TYPE AND HOW OFTEN:NO     reports that she quit smoking about 5 years ago. Her smoking use included Cigarettes. She has a 65.00 pack-year smoking history. She has never used smokeless tobacco. She reports that she drinks about 0.6 - 1.2 oz of alcohol per week . She reports that she does not use drugs.  Family History  Problem Relation Age of Onset  . Hypercalcemia Daughter   . Hypertension Daughter    Family Status  Relation Status  . Mother Deceased  . Father Deceased  . Brother Alive  . Daughter Alive  . Son Alive  . MGM Deceased  . MGF Deceased  . PGM Deceased  . PGF Deceased  . Son Alive     Allergies    Allergen Reactions  . Banana Nausea Only    All banana products also  . Penicillins Hives    Medications: Patient's Medications  New Prescriptions   No medications on file  Previous Medications   APIXABAN (ELIQUIS) 5 MG TABS TABLET    Take 1 tablet (5 mg total) by mouth 2 (two) times daily.   ASCORBIC ACID (VITAMIN C) 500 MG TABLET    Take 1,000 mg by mouth daily.   CHOLECALCIFEROL (VITAMIN D) 1000 UNITS TABLET    Take 1,000 Units by mouth daily.   DILTIAZEM (CARDIZEM LA) 120 MG 24 HR TABLET    Take 1 tablet (120 mg total) by mouth 2 (two) times daily.   FUROSEMIDE (LASIX) 20 MG TABLET    TAKE 1 TABLET DAILY AS NEEDED FOR WEIGHT GAIN >3LBS.   METOPROLOL SUCCINATE (TOPROL XL) 25 MG 24 HR TABLET    Take 1 tablet by mouth twice a day   MULTIPLE VITAMIN (MULTIVITAMIN) CAPSULE    Take 1 capsule by mouth daily.   SYNTHROID 125 MCG TABLET    TAKE 1 TABLET (125 MCG TOTAL) BY MOUTH DAILY BEFORE BREAKFAST.   TRAMADOL (ULTRAM) 50 MG TABLET    TAKE 1 TABLET EVERY 8 HOURS AS NEEDED FOR PAIN   VITAMIN E 400 UNIT CAPSULE    Take 400 Units by mouth daily.   ZOSTER VAC RECOMB ADJUVANTED (SHINGRIX) INJECTION    Inject 0.5 mLs into the muscle once.  Modified Medications   No medications on file  Discontinued Medications   SYNTHROID 125 MCG TABLET    TAKE 1 TABLET (125 MCG TOTAL) BY MOUTH DAILY BEFORE BREAKFAST.    Review of Systems  Constitutional: Positive for fatigue.  HENT: Positive for rhinorrhea and voice change.   Eyes: Positive for itching.  Skin: Positive for color change (distal LE appears hyperpigmented at times).  Allergic/Immunologic: Positive for environmental allergies.  Neurological: Positive for dizziness.  All other systems reviewed and are negative.   Vitals:   01/03/17 0855  BP: 126/80  Pulse: 96  Temp: 97.7 F (36.5 C)  TempSrc: Oral  Weight: 147 lb (66.7 kg)  Height: _0  (1.6 m)   Body mass index is 26.04 kg/m.  Physical Exam  Constitutional: She is oriented  to person, place, and time. She appears well-developed and well-nourished.  HENT:  Mouth/Throat: No oropharyngeal exudate.  TMs intact with no redness/bulging; no  sinus TTP; nares with enlarged grey dry turbinates; oropharynx cobblestoning with redness but no exudate; MM dry; no oral thrush  Eyes: Pupils are equal, round, and reactive to light. Right eye exhibits no discharge. Left eye exhibits no discharge. No scleral icterus.  Neck: Neck supple.  Cardiovascular: Normal rate.  An irregularly irregular rhythm present.  Murmur heard.  Systolic murmur is present with a grade of 1/6  No LE edema b/l. No calf TTP  Pulmonary/Chest: Effort normal and breath sounds normal. No respiratory distress. She has no wheezes. She has no rales. She exhibits no tenderness.  Musculoskeletal: She exhibits edema, tenderness and deformity.       Lumbar back: She exhibits decreased range of motion, tenderness, pain and spasm.       Back:  Lymphadenopathy:    She has no cervical adenopathy.  Neurological: She is alert and oriented to person, place, and time. She has normal strength. Gait abnormal.  Skin: Skin is warm and dry. No rash noted.  Multiple spider veins b/l distal LE; postinflammatory hyperpigmentation b/l distal LE     Labs reviewed: Office Visit on 10/04/2016  Component Date Value Ref Range Status  . Sodium 10/04/2016 139  135 - 146 mmol/L Final  . Potassium 10/04/2016 4.7  3.5 - 5.3 mmol/L Final  . Chloride 10/04/2016 102  98 - 110 mmol/L Final  . CO2 10/04/2016 30  20 - 31 mmol/L Final  . Glucose, Bld 10/04/2016 89  65 - 99 mg/dL Final  . BUN 10/04/2016 19  7 - 25 mg/dL Final  . Creat 10/04/2016 0.87  0.60 - 0.88 mg/dL Final   Comment:   For patients > or = 81 years of age: The upper reference limit for Creatinine is approximately 13% higher for people identified as African-American.     . Total Bilirubin 10/04/2016 0.7  0.2 - 1.2 mg/dL Final  . Alkaline Phosphatase 10/04/2016 68  33 -  130 U/L Final  . AST 10/04/2016 13  10 - 35 U/L Final  . ALT 10/04/2016 8  6 - 29 U/L Final  . Total Protein 10/04/2016 6.7  6.1 - 8.1 g/dL Final  . Albumin 10/04/2016 4.2  3.6 - 5.1 g/dL Final  . Calcium 10/04/2016 10.0  8.6 - 10.4 mg/dL Final  . GFR, Est African American 10/04/2016 68  >=60 mL/min Final  . GFR, Est Non African American 10/04/2016 59* >=60 mL/min Final  . TSH 10/04/2016 0.09* mIU/L Final   Comment:   Reference Range   > or = 20 Years  0.40-4.50   Pregnancy Range First trimester  0.26-2.66 Second trimester 0.55-2.73 Third trimester  0.43-2.91     . Free T4 10/04/2016 1.8  0.8 - 1.8 ng/dL Final  . WBC 10/04/2016 6.2  3.8 - 10.8 K/uL Final  . RBC 10/04/2016 4.78  3.80 - 5.10 MIL/uL Final  . Hemoglobin 10/04/2016 14.4  11.7 - 15.5 g/dL Final  . HCT 10/04/2016 43.8  35.0 - 45.0 % Final  . MCV 10/04/2016 91.6  80.0 - 100.0 fL Final  . MCH 10/04/2016 30.1  27.0 - 33.0 pg Final  . MCHC 10/04/2016 32.9  32.0 - 36.0 g/dL Final  . RDW 10/04/2016 14.1  11.0 - 15.0 % Final  . Platelets 10/04/2016 342  140 - 400 K/uL Final  . MPV 10/04/2016 10.6  7.5 - 12.5 fL Final  . Neutro Abs 10/04/2016 4154  1,500 - 7,800 cells/uL Final  . Lymphs Abs 10/04/2016 1116  850 -  3,900 cells/uL Final  . Monocytes Absolute 10/04/2016 682  200 - 950 cells/uL Final  . Eosinophils Absolute 10/04/2016 186  15 - 500 cells/uL Final  . Basophils Absolute 10/04/2016 62  0 - 200 cells/uL Final  . Neutrophils Relative % 10/04/2016 67  % Final  . Lymphocytes Relative 10/04/2016 18  % Final  . Monocytes Relative 10/04/2016 11  % Final  . Eosinophils Relative 10/04/2016 3  % Final  . Basophils Relative 10/04/2016 1  % Final  . Smear Review 10/04/2016 Criteria for review not met   Final  . Color, Urine 10/04/2016 YELLOW  YELLOW Final  . APPearance 10/04/2016 CLEAR  CLEAR Final  . Specific Gravity, Urine 10/04/2016 1.019  1.001 - 1.035 Final  . pH 10/04/2016 6.0  5.0 - 8.0 Final  . Glucose, UA  10/04/2016 NEGATIVE  NEGATIVE Final  . Bilirubin Urine 10/04/2016 NEGATIVE  NEGATIVE Final  . Ketones, ur 10/04/2016 NEGATIVE  NEGATIVE Final  . Hgb urine dipstick 10/04/2016 NEGATIVE  NEGATIVE Final  . Protein, ur 10/04/2016 NEGATIVE  NEGATIVE Final  . Nitrite 10/04/2016 NEGATIVE  NEGATIVE Final  . Leukocytes, UA 10/04/2016 2+* NEGATIVE Final  . WBC, UA 10/04/2016 0-5  <=5 WBC/HPF Final   Confirmed by microscopic review.  . RBC / HPF 10/04/2016 NONE SEEN  <=2 RBC/HPF Final  . Squamous Epithelial / LPF 10/04/2016 0-5  <=5 HPF Final  . Bacteria, UA 10/04/2016 FEW* NONE SEEN HPF Final  . Crystals 10/04/2016 NONE SEEN  NONE SEEN HPF Final  . Casts 10/04/2016 NONE SEEN  NONE SEEN LPF Final  . Yeast 10/04/2016 NONE SEEN  NONE SEEN HPF Final    No results found.   Assessment/Plan   ICD-10-CM   1. Seasonal allergic rhinitis due to pollen J30.1 fluticasone (FLONASE) 50 MCG/ACT nasal spray    methylPREDNISolone (MEDROL) 4 MG TBPK tablet  2. Numbness and tingling of left leg R20.0 MR Lumbar Spine Wo Contrast   R20.2 CMP with eGFR    gabapentin (NEURONTIN) 100 MG capsule  3. Chronic left-sided low back pain with left-sided sciatica M54.42 MR Lumbar Spine Wo Contrast   G89.29   4. Hypothyroidism due to acquired atrophy of thyroid E03.4 TSH    T4, Free  5. Permanent atrial fibrillation ([facility]) I48.2    Change allergy medicine from claritin to ZYRTEC 56m daily  Use steroid nasal spray as directed  Use saline nasal spray as needed to keep nose moist  Take steroid/prednisone as directed  Continue other medications as ordered  Will call with lab/MRI results  Follow up as scheduled next month   Elizette Shek S. CPerlie Gold PCgs Endoscopy Center PLLCand [facility] [address]ble[address] StreetGBowling Green Guthrie Center [phone] (Monday-Friday 8 AM - 5 PM) ([phone] 5 PM and follow prompts

## 2017-01-03 NOTE — Patient Instructions (Signed)
Change allergy medicine from claritin to [facility]  daily  Use steroid nasal spray as directed  Use saline nasal spray as needed to keep nose moist  Take steroid/prednisone as directed  Continue other medications as ordered  Will call with lab/MRI results  Follow up as scheduled next month

## 2017-01-04 LAB — COMPLETE METABOLIC PANEL WITH GFR
ALT: 7 U/L (ref 6–29)
AST: 14 U/L (ref 10–35)
Albumin: 4.2 g/dL (ref 3.6–5.1)
Alkaline Phosphatase: 74 U/L (ref 33–130)
BUN: 12 mg/dL (ref 7–25)
CO2: 22 mmol/L (ref 20–32)
Calcium: 10.2 mg/dL (ref 8.6–10.4)
Chloride: 101 mmol/L (ref 98–110)
Creat: 0.84 mg/dL (ref 0.60–0.88)
GFR, EST AFRICAN AMERICAN: 71 mL/min (ref >=60)
GFR, EST NON AFRICAN AMERICAN: 62 mL/min (ref >=60)
Glucose, Bld: 106 mg/dL — ABNORMAL HIGH (ref 65–99)
POTASSIUM: 5.3 mmol/L (ref 3.5–5.3)
Sodium: 139 mmol/L (ref 135–146)
Total Bilirubin: 0.5 mg/dL (ref 0.2–1.2)
Total Protein: 6.7 g/dL (ref 6.1–8.1)

## 2017-01-13 ENCOUNTER — Ambulatory Visit
Admission: RE | Admit: 2017-01-13 | Discharge: 2017-01-13 | Disposition: A | Discharge status: Home / Self Care | Payer: Medicare Other | Source: Ambulatory Visit | Attending: Internal Medicine | Admitting: Internal Medicine

## 2017-01-13 DIAGNOSIS — M48061 Spinal stenosis, lumbar region without neurogenic claudication: Secondary | ICD-10-CM | POA: Diagnosis not present

## 2017-01-13 DIAGNOSIS — M5442 Lumbago with sciatica, left side: Secondary | ICD-10-CM

## 2017-01-13 DIAGNOSIS — R202 Paresthesia of skin: Principal | ICD-10-CM

## 2017-01-13 DIAGNOSIS — G8929 Other chronic pain: Secondary | ICD-10-CM

## 2017-01-13 DIAGNOSIS — R200 Anesthesia of skin: Secondary | ICD-10-CM

## 2017-01-23 ENCOUNTER — Other Ambulatory Visit: Payer: Self-pay | Admitting: Internal Medicine

## 2017-02-07 ENCOUNTER — Ambulatory Visit (INDEPENDENT_AMBULATORY_CARE_PROVIDER_SITE_OTHER): Payer: Medicare Other | Admitting: Internal Medicine

## 2017-02-07 ENCOUNTER — Encounter: Payer: Self-pay | Admitting: Internal Medicine

## 2017-02-07 VITALS — BP 110/60 | HR 88 | Temp 97.6°F | Resp 20 | Ht 63.0 in | Wt 146.8 lb

## 2017-02-07 DIAGNOSIS — M48062 Spinal stenosis, lumbar region with neurogenic claudication: Secondary | ICD-10-CM

## 2017-02-07 DIAGNOSIS — I480 Paroxysmal atrial fibrillation: Secondary | ICD-10-CM

## 2017-02-07 DIAGNOSIS — R202 Paresthesia of skin: Secondary | ICD-10-CM | POA: Diagnosis not present

## 2017-02-07 DIAGNOSIS — J301 Allergic rhinitis due to pollen: Secondary | ICD-10-CM

## 2017-02-07 DIAGNOSIS — R200 Anesthesia of skin: Secondary | ICD-10-CM | POA: Diagnosis not present

## 2017-02-07 DIAGNOSIS — M1991 Primary osteoarthritis, unspecified site: Secondary | ICD-10-CM | POA: Diagnosis not present

## 2017-02-07 DIAGNOSIS — E034 Atrophy of thyroid (acquired): Secondary | ICD-10-CM

## 2017-02-07 MED ORDER — TRAMADOL HCL 50 MG PO TABS: 50.0000 mg | ORAL_TABLET | Freq: Three times a day (TID) | ORAL | 0 refills | Status: DC | PRN

## 2017-02-07 MED ORDER — GABAPENTIN 300 MG PO CAPS: 300.0000 mg | ORAL_CAPSULE | Freq: Every day | ORAL | 3 refills | Status: DC

## 2017-02-07 MED ORDER — LEVOTHYROXINE SODIUM 137 MCG PO TABS: 137.0000 ug | ORAL_TABLET | Freq: Every day | ORAL | 3 refills | Status: DC

## 2017-02-07 NOTE — Progress Notes (Signed)
Location:  [facility]  Provider:   Code Status:  Goals of Care:  Advanced Directives 02/07/2017  Does Patient Have a Medical Advance Directive? No  Does patient want to make changes to medical advance directive? Yes (ED - Information included in AVS)  Would patient like information on creating a medical advance directive? -  Pre-existing out of facility DNR order (yellow form or pink MOST form) -     Chief Complaint  Patient presents with  . Medical Management of Chronic Issues    4 mo f/u thyroid and numbness in left leg,arthritis    HPI: Patient is a 83 y.o. female seen today for 4 month evaluation for thyroid and numbness in left leg. She is a pleasant and a very good historian for her own medical history. [name] says that she has lived in the area for past 30 years. She is originally from [place]. Her daughter is in the office today.   Chronic low back pain - She states her back pian   Numbness and tinging of left lower leg - States Gabapentin helped with lower extremity numbness. States that today the numbness returned today, but it isn't as bad as normal.   Hypothyroidism - States some extra hair loss and fatigue. She says that feels like here synthroid dose is too low, but understands that she may not be able to have a larger dose due to her afib.  Afib - She says that she has some shortness of breath when walking too much. She notes that she does have more fatigue since being diagnosed with afib.  Allergies - Patient states her nose has been running. She says that she has been taking Flonase.   Past Medical History:  Diagnosis Date  . A-fib (HCC) 05/2015   HOSPITALIZED   . Arthritis   . Cataract   . Hypothyroidism   . Permanent atrial fibrillation (HCC)   . Thyroid disease     Past Surgical History:  Procedure Laterality Date  . CARDIOVERSION N/A 06/08/2015   Procedure: CARDIOVERSION;  Surgeon: Jake Bathe, MD;  Location: [facility] ENDOSCOPY;  Service:  Cardiovascular;  Laterality: N/A;  . CARDIOVERSION N/A 09/02/2015   Procedure: CARDIOVERSION;  Surgeon: Laqueta Linden, MD;  Location: [facility] ENDOSCOPY;  Service: Cardiovascular;  Laterality: N/A;  . CATARACT EXTRACTION, BILATERAL Bilateral 2010  . JOINT REPLACEMENT    . KNEE SURGERY  2002  . right hip replacement    . RIGHT KNEE REPLACEMENT  2011  . TEE WITHOUT CARDIOVERSION N/A 06/08/2015   Procedure: TRANSESOPHAGEAL ECHOCARDIOGRAM (TEE);  Surgeon: Jake Bathe, MD;  Location: [facility] ENDOSCOPY;  Service: Cardiovascular;  Laterality: N/A;  . TOTAL HIP ARTHROPLASTY Right 2011  . TOTAL SHOULDER REPLACEMENT Right 2003  . TOTAL SHOULDER REPLACEMENT Left 2007  . WRIST SURGERY Bilateral 2008   both wrists    Allergies  Allergen Reactions  . Banana Nausea Only    All banana products also  . Penicillins Hives    Outpatient Encounter Prescriptions as of 02/07/2017  Medication Sig  . apixaban (ELIQUIS) 5 MG TABS tablet Take 1 tablet (5 mg total) by mouth 2 (two) times daily.  Marland Kitchen ascorbic acid (VITAMIN C) 500 MG tablet Take 1,000 mg by mouth daily.  . cholecalciferol (VITAMIN D) 1000 units tablet Take 1,000 Units by mouth daily.  Marland Kitchen diltiazem (CARDIZEM LA) 120 MG 24 hr tablet Take 1 tablet (120 mg total) by mouth 2 (two) times daily.  . fluticasone (FLONASE) 50  MCG/ACT nasal spray Place 1 spray into both nostrils daily.  . furosemide (LASIX) 20 MG tablet TAKE 1 TABLET DAILY AS NEEDED FOR WEIGHT GAIN >3LBS.  Marland Kitchen gabapentin (NEURONTIN) 100 MG capsule Take 1 capsule (100 mg total) by mouth at bedtime.  . metoprolol succinate (TOPROL XL) 25 MG 24 hr tablet Take 1 tablet by mouth twice a day  . Multiple Vitamin (MULTIVITAMIN) capsule Take 1 capsule by mouth daily.  Marland Kitchen SYNTHROID 125 MCG tablet TAKE 1 TABLET (125 MCG TOTAL) BY MOUTH DAILY BEFORE BREAKFAST.  Marland Kitchen traMADol (ULTRAM) 50 MG tablet TAKE 1 TABLET EVERY 8 HOURS AS NEEDED FOR PAIN  . vitamin E 400 UNIT capsule Take 400 Units by mouth daily.  .  [DISCONTINUED] methylPREDNISolone (MEDROL) 4 MG TBPK tablet Take as directed   No facility-administered encounter medications on file as of 02/07/2017.     Review of Systems:  Review of Systems  Constitutional: Positive for fatigue. Negative for activity change, appetite change, chills, diaphoresis, fever and unexpected weight change.  HENT: Positive for hearing loss (Bilateral hearing aids), postnasal drip, rhinorrhea and sneezing. Negative for congestion, dental problem, drooling, ear discharge, ear pain, facial swelling, mouth sores, nosebleeds, sinus pain, sinus pressure, sore throat, tinnitus, trouble swallowing and voice change.   Eyes: Negative for photophobia, pain, discharge, redness, itching and visual disturbance.  Respiratory: Positive for shortness of breath (With ambulation.). Negative for apnea, cough, choking, chest tightness, wheezing and stridor.   Cardiovascular: Negative for chest pain, palpitations and leg swelling.  Gastrointestinal: Negative for abdominal distention, abdominal pain, anal bleeding, blood in stool, constipation, diarrhea, nausea, rectal pain and vomiting.  Endocrine: Negative for cold intolerance, heat intolerance, polydipsia, polyphagia and polyuria.  Genitourinary: Negative for difficulty urinating, dyspareunia and dysuria.  Musculoskeletal: Positive for back pain and gait problem. Negative for arthralgias, joint swelling, myalgias, neck pain and neck stiffness.  Skin: Negative for color change, pallor, rash and wound.  Allergic/Immunologic: Negative for environmental allergies, food allergies and immunocompromised state.  Neurological: Positive for numbness (Bottom of left foot). Negative for dizziness, tremors, seizures, syncope, facial asymmetry, speech difficulty, weakness, light-headedness and headaches.  Hematological: Negative for adenopathy. Does not bruise/bleed easily.  Psychiatric/Behavioral: Negative for agitation, behavioral problems, confusion,  decreased concentration and suicidal ideas.    Health Maintenance  Topic Date Due  . INFLUENZA VACCINE  12/20/2016  . TETANUS/TDAP  10/11/2026  . DEXA SCAN  Completed  . PNA vac Low Risk Adult  Completed    Physical Exam: Vitals:   02/07/17 1043  BP: 110/60  Pulse: 88  Resp: 20  Temp: 97.6 F (36.4 C)  TempSrc: Oral  SpO2: 96%  Weight: 146 lb 12.8 oz (66.6 kg)  Height:  (1.6 m)   Body mass index is 26 kg/m. Physical Exam  Constitutional: She is oriented to person, place, and time. She appears well-developed and well-nourished. No distress.  HENT:  Head: Normocephalic and atraumatic.  Right Ear: External ear normal.  Left Ear: External ear normal.  Nose: No rhinorrhea. Right sinus exhibits no maxillary sinus tenderness and no frontal sinus tenderness. Left sinus exhibits no maxillary sinus tenderness and no frontal sinus tenderness.  Mouth/Throat: Oropharynx is clear and moist. No oropharyngeal exudate.  Eyes: Pupils are equal, round, and reactive to light. Conjunctivae and EOM are normal. Right eye exhibits no discharge. Left eye exhibits no discharge. No scleral icterus.  Neck: Normal range of motion. Neck supple. No JVD present. No tracheal deviation present. No thyromegaly present.  Cardiovascular: Intact  distal pulses.  Exam reveals gallop and S4.   Pulmonary/Chest: Effort normal and breath sounds normal. No stridor. No respiratory distress. She has no wheezes. She has no rales. She exhibits no tenderness.  Abdominal: Soft. Bowel sounds are normal. She exhibits no distension. There is no tenderness.  Musculoskeletal: Normal range of motion. She exhibits no edema, tenderness or deformity.  Lymphadenopathy:    She has no cervical adenopathy.  Neurological: She is alert and oriented to person, place, and time. A sensory deficit (Numbness and tingling bottom of left foot.) is present.  Skin: Skin is warm and dry. She is not diaphoretic.  Psychiatric: She has a normal  mood and affect. Her behavior is normal. Judgment and thought content normal.    Labs reviewed: Basic Metabolic Panel:  Recent Labs  96/04/54 1217 10/04/16 1124 01/03/17 0955  NA 139 139 139  K 5.2 4.7 5.3  CL 102 102 101  CO2 GLUCOSE 93 89 106*  BUN CREATININE 0.97* 0.87 0.84  CALCIUM 10.1 10.0 10.2  TSH 0.19* 0.09* 0.12*   Liver Function Tests:  Recent Labs  02/25/16 1217 10/04/16 1124 01/03/17 0955  AST ALT ALKPHOS 66 68 74  BILITOT 0.6 0.7 0.5  PROT 6.8 6.7 6.7  ALBUMIN 4.0 4.2 4.2   No results for input(s): LIPASE, AMYLASE in the last 8760 hours. No results for input(s): AMMONIA in the last 8760 hours. CBC:  Recent Labs  10/04/16 1124  WBC 6.2  NEUTROABS 4,154  HGB 14.4  HCT 43.8  MCV 91.6  PLT 342   Lipid Panel: No results for input(s): CHOL, HDL, LDLCALC, TRIG, CHOLHDL, LDLDIRECT in the last 8760 hours. No results found for: HGBA1C  Procedures since last visit: Mr Lumbar Spine Wo Contrast  Result Date: 01/13/2017 CLINICAL DATA:  Chronic low left back pain that is getting worse. EXAM: MRI LUMBAR SPINE WITHOUT CONTRAST TECHNIQUE: Multiplanar, multisequence MR imaging of the lumbar spine was performed. No intravenous contrast was administered. COMPARISON:  None. FINDINGS: Segmentation:  Standard based on the available images. Alignment:  Moderate levoscoliosis.  Mild L4-5 anterolisthesis. Vertebrae: Mild degenerative type edema around the left aspect of the T10-11 disc. There could be a T10 inferior endplate Schmorl's node. No acute fracture, discitis, or aggressive bone lesion. Conus medullaris: Extends to the L1-2 disc level and appears normal. Small Tarlov cysts at the level of S2 and S3. Paraspinal and other soft tissues: Fatty atrophy of intrinsic back muscles. No acute finding. Disc levels: T9-10, T10-11, and T11-12 prominent degenerative disc narrowing and left-sided facet spurring. Left-sided foraminal narrowing  at these levels. T12- L1: Asymmetric rightward degenerative disc narrowing with far-lateral endplate spurring. Mild right-sided facet spurring. Mild right foraminal narrowing. Patent spinal canal L1-L2: Asymmetric rightward degenerative disc narrowing and far-lateral spurring. Asymmetric right-sided facet spurring. Mild moderate right foraminal narrowing. Patent spinal canal. L2-L3: Advanced asymmetric rightward disc narrowing and far-lateral endplate spurring. Asymmetric right facet spurring. Right subarticular recess impingement on L3. Mild right foraminal narrowing. L3-L4: Advanced disc degeneration and endplate irregularity. Facet arthropathy with severe joint distortion. Advanced spinal stenosis with flattening of the thecal sac. Mild left and moderate right foraminal narrowing. L4-L5: Disc degeneration with asymmetric leftward narrowing and far-lateral spurring. Advanced facet arthropathy with asymmetric hypertrophy on the left. Left subarticular recess impingement on L5. Moderate left foraminal narrowing. L5-S1:Advanced degenerative disc narrowing with endplate ridging. Asymmetric left facet spurring with moderate hypertrophy. Left  foraminal L5 compression. IMPRESSION: 1. Severe degenerative changes with levoscoliosis. 2. L2-3 right subarticular recess impingement which could affect L3. 3. L3-4 severe spinal stenosis. Moderate right foraminal impingement. 4. L4-5 left subarticular recess and foraminal impingement. 5. L5-S1 left foraminal L5 compression. 6. Axial T1 weighted images could not be obtained due to patient discomfort. Electronically Signed   By: Marnee Spring M.D.   On: 01/13/2017 18:49    Assessment/Plan  Return in 3 months for review of Thyroid, low back pain, and left lower extremity numbness.   Labs/tests ordered:   Next appt:  Visit date not found   Cassandra Harbold C. Naje Rice, Student AGACNP

## 2017-02-07 NOTE — Progress Notes (Signed)
Patient ID: Katrina Velez, female   DOB: 04/03/1926, 83 y.o.   MRN: [id number]    Location:  [facility] Place of Service: OFFICE  Chief Complaint  Patient presents with  . Medical Management of Chronic Issues    4 mo f/u thyroid and numbness in left leg,arthritis    HPI:  83 yo female seen today for f/u. MRI L spine last month revealed severe DDD with left L5-S1 foraminal L5 compression; moderate-severe stenosis at L3-L4. She is unsure whether to pursue Ortho eval. She continues to have numbness in LLE  Seasonal allergic rhinitis - borderline controlled on OTC anthistamine and flonase. (+) rhinorrhea and post nasal drip  PAF - rate controlled on cardizem LA and metoprolol succ. She takes eliquis for anticoagulation. Followed by cardiology. She had a successful cardioversion in Arril 2017 but later reverted back to afib. She was hospitalized in Jan 2017 for afib w RVR, acute CHF and acute pulmonary edema. 2D echo showed EF 50-55%. Her last ECG in Apr 2018 showed afib.  Hx CHF - takes lasix daily prn "feeling bloated". Followed by cardiology  COPD/hx tobacco abuse - stable off meds. She no longer smokes  Arthritis - stable. Her pain is improved on tramadol prn and also uses topical OTC lidocaine on wrists for carpal tunnel discomfort. She has taken ASA in the past but was told not to combine it with eliquis. She is intolerant of tylenol.   Hypothyroidism - takes brand name only synthroid daily. TSH 0.12; free T4 1.6. She no longer sees Endo. She is taking med fasting and 1st thing in the AM.    Past Medical History:  Diagnosis Date  . A-fib (HCC) 05/2015   HOSPITALIZED   . Arthritis   . Cataract   . Hypothyroidism   . Permanent atrial fibrillation (HCC)   . Thyroid disease     Past Surgical History:  Procedure Laterality Date  . CARDIOVERSION N/A 06/08/2015   Procedure: CARDIOVERSION;  Surgeon: Jake Bathe, MD;  Location: [facility] ENDOSCOPY;  Service: Cardiovascular;  Laterality:  N/A;  . CARDIOVERSION N/A 09/02/2015   Procedure: CARDIOVERSION;  Surgeon: Laqueta Linden, MD;  Location: [facility] ENDOSCOPY;  Service: Cardiovascular;  Laterality: N/A;  . CATARACT EXTRACTION, BILATERAL Bilateral 2010  . JOINT REPLACEMENT    . KNEE SURGERY  2002  . right hip replacement    . RIGHT KNEE REPLACEMENT  2011  . TEE WITHOUT CARDIOVERSION N/A 06/08/2015   Procedure: TRANSESOPHAGEAL ECHOCARDIOGRAM (TEE);  Surgeon: Jake Bathe, MD;  Location: [facility] ENDOSCOPY;  Service: Cardiovascular;  Laterality: N/A;  . TOTAL HIP ARTHROPLASTY Right 2011  . TOTAL SHOULDER REPLACEMENT Right 2003  . TOTAL SHOULDER REPLACEMENT Left 2007  . WRIST SURGERY Bilateral 2008   both wrists    Patient Care Team: Kirt Boys, DO as PCP - General (Internal Medicine) Sallye Lat, MD as Consulting Physician (Ophthalmology) Newman Nip, NP as Nurse Practitioner (Cardiology)  Social History   Social History  . Marital status: Widowed    Spouse name: N/A  . Number of children: N/A  . Years of education: N/A   Occupational History  . Not on file.   Social History Main Topics  . Smoking status: Former Smoker    Packs/day: 1.00    Years: 65.00    Types: Cigarettes    Quit date: 05/28/2011  . Smokeless tobacco: Never Used  . Alcohol use 0.6 - 1.2 oz/week    1 - 2 Glasses of wine per week  Comment: occasional drink  . Drug use: No  . Sexual activity: Not on file   Other Topics Concern  . Not on file   Social History Narrative   DIET: NONE      DO YOU DRINK/EAT THINGS WITH CAFFEINE: YES, COFFEE AND TEA       MARITAL STATUS: WIDOWED      WHAT YEAR WERE YOU MARRIED:1950      DO YOU LIVE IN A HOUSE, APARTMENT, ASSISTED LIVING, CONDO TRAILER ETC.: HOUSE       IS IT ONE OR MORE STORIES: 2 STORIES      HOW MANY PERSONS LIVE IN YOUR HOME: 1      DO YOU HAVE PETS IN YOUR HOME: 1 CAT AND A PART TIME DOG       CURRENT OR PAST PROFESSION: CIVIL SERVANT      DO YOU EXERCISE: NO        WHAT TYPE AND HOW OFTEN:NO     reports that she quit smoking about 5 years ago. Her smoking use included Cigarettes. She has a 65.00 pack-year smoking history. She has never used smokeless tobacco. She reports that she drinks about 0.6 - 1.2 oz of alcohol per week . She reports that she does not use drugs.  Family History  Problem Relation Age of Onset  . Hypercalcemia Daughter   . Hypertension Daughter    Family Status  Relation Status  . Mother Deceased  . Father Deceased  . Brother Alive  . Daughter Alive  . Son Alive  . MGM Deceased  . MGF Deceased  . PGM Deceased  . PGF Deceased  . Son Alive     Allergies  Allergen Reactions  . Banana Nausea Only    All banana products also  . Penicillins Hives    Medications: Patient's Medications  New Prescriptions   No medications on file  Previous Medications   APIXABAN (ELIQUIS) 5 MG TABS TABLET    Take 1 tablet (5 mg total) by mouth 2 (two) times daily.   ASCORBIC ACID (VITAMIN C) 500 MG TABLET    Take 1,000 mg by mouth daily.   CHOLECALCIFEROL (VITAMIN D) 1000 UNITS TABLET    Take 1,000 Units by mouth daily.   DILTIAZEM (CARDIZEM LA) 120 MG 24 HR TABLET    Take 1 tablet (120 mg total) by mouth 2 (two) times daily.   FLUTICASONE (FLONASE) 50 MCG/ACT NASAL SPRAY    Place 1 spray into both nostrils daily.   FUROSEMIDE (LASIX) 20 MG TABLET    TAKE 1 TABLET DAILY AS NEEDED FOR WEIGHT GAIN >3LBS.   GABAPENTIN (NEURONTIN) 100 MG CAPSULE    Take 1 capsule (100 mg total) by mouth at bedtime.   METOPROLOL SUCCINATE (TOPROL XL) 25 MG 24 HR TABLET    Take 1 tablet by mouth twice a day   MULTIPLE VITAMIN (MULTIVITAMIN) CAPSULE    Take 1 capsule by mouth daily.   SYNTHROID 125 MCG TABLET    TAKE 1 TABLET (125 MCG TOTAL) BY MOUTH DAILY BEFORE BREAKFAST.   TRAMADOL (ULTRAM) 50 MG TABLET    TAKE 1 TABLET EVERY 8 HOURS AS NEEDED FOR PAIN   VITAMIN E 400 UNIT CAPSULE    Take 400 Units by mouth daily.  Modified Medications   No  medications on file  Discontinued Medications   METHYLPREDNISOLONE (MEDROL) 4 MG TBPK TABLET    Take as directed    Review of Systems  Constitutional: Positive for fatigue.  Musculoskeletal: Positive for arthralgias and back pain.  Skin:       Hair loss; brittle nails  All other systems reviewed and are negative.   Vitals:   02/07/17 1043  BP: 110/60  Pulse: 88  Resp: 20  Temp: 97.6 F (36.4 C)  TempSrc: Oral  SpO2: 96%  Weight: 146 lb 12.8 oz (66.6 kg)  Height:  (1.6 m)   Body mass index is 26 kg/m.  Physical Exam  Constitutional: She is oriented to person, place, and time. She appears well-developed and well-nourished.  HENT:  Mouth/Throat: Oropharynx is clear and moist. No oropharyngeal exudate.  MMM; no oral thrush  Eyes: Pupils are equal, round, and reactive to light. No scleral icterus.  Neck: Neck supple. Carotid bruit is not present. No tracheal deviation present. No thyromegaly present.  Cardiovascular: Normal rate and intact distal pulses.  An irregularly irregular rhythm present. Exam reveals no gallop and no friction rub.   Murmur (1/6 SEM) heard. B/l LE spider veins  Pulmonary/Chest: Effort normal and breath sounds normal. No stridor. No respiratory distress. She has no wheezes. She has no rales.  Abdominal: Soft. Normal appearance and bowel sounds are normal. She exhibits no distension and no mass. There is no hepatomegaly. There is no tenderness. There is no rigidity, no rebound and no guarding. No hernia.  Musculoskeletal: She exhibits edema and tenderness.       Lumbar back: She exhibits decreased range of motion, tenderness, swelling and spasm.       Back:  Lymphadenopathy:    She has no cervical adenopathy.  Neurological: She is alert and oriented to person, place, and time.  Gait antalgic  Skin: Skin is warm and dry. No rash noted.  Psychiatric: She has a normal mood and affect. Her behavior is normal. Judgment and thought content normal.      Labs reviewed: Office Visit on 01/03/2017  Component Date Value Ref Range Status  . Sodium 01/03/2017 139  135 - 146 mmol/L Final  . Potassium 01/03/2017 5.3  3.5 - 5.3 mmol/L Final  . Chloride 01/03/2017 101  98 - 110 mmol/L Final  . CO2 01/03/2017 22  20 - 32 mmol/L Final   Comment: ** Please note change in reference range(s). **     . Glucose, Bld 01/03/2017 106* 65 - 99 mg/dL Final  . BUN 16/02/9603 12  7 - 25 mg/dL Final  . Creat 54/01/8118 0.84  0.60 - 0.88 mg/dL Final   Comment:   For patients > or = 82 years of age: The upper reference limit for Creatinine is approximately 13% higher for people identified as African-American.     . Total Bilirubin 01/03/2017 0.5  0.2 - 1.2 mg/dL Final  . Alkaline Phosphatase 01/03/2017 74  33 - 130 U/L Final  . AST 01/03/2017 14  10 - 35 U/L Final  . ALT 01/03/2017 7  6 - 29 U/L Final  . Total Protein 01/03/2017 6.7  6.1 - 8.1 g/dL Final  . Albumin 14/78/2956 4.2  3.6 - 5.1 g/dL Final  . Calcium 21/30/8657 10.2  8.6 - 10.4 mg/dL Final  . GFR, Est African American 01/03/2017 71  >=60 mL/min Final  . GFR, Est Non African American 01/03/2017 62  >=60 mL/min Final  . TSH 01/03/2017 0.12* mIU/L Final   Comment:   Reference Range   > or = 20 Years  0.40-4.50   Pregnancy Range First trimester  0.26-2.66 Second trimester 0.55-2.73 Third trimester  0.43-2.91     .  Free T4 01/03/2017 1.6  0.8 - 1.8 ng/dL Final    Mr Lumbar Spine Wo Contrast  Result Date: 01/13/2017 CLINICAL DATA:  Chronic low left back pain that is getting worse. EXAM: MRI LUMBAR SPINE WITHOUT CONTRAST TECHNIQUE: Multiplanar, multisequence MR imaging of the lumbar spine was performed. No intravenous contrast was administered. COMPARISON:  None. FINDINGS: Segmentation:  Standard based on the available images. Alignment:  Moderate levoscoliosis.  Mild L4-5 anterolisthesis. Vertebrae: Mild degenerative type edema around the left aspect of the T10-11 disc. There could  be a T10 inferior endplate Schmorl's node. No acute fracture, discitis, or aggressive bone lesion. Conus medullaris: Extends to the L1-2 disc level and appears normal. Small Tarlov cysts at the level of S2 and S3. Paraspinal and other soft tissues: Fatty atrophy of intrinsic back muscles. No acute finding. Disc levels: T9-10, T10-11, and T11-12 prominent degenerative disc narrowing and left-sided facet spurring. Left-sided foraminal narrowing at these levels. T12- L1: Asymmetric rightward degenerative disc narrowing with far-lateral endplate spurring. Mild right-sided facet spurring. Mild right foraminal narrowing. Patent spinal canal L1-L2: Asymmetric rightward degenerative disc narrowing and far-lateral spurring. Asymmetric right-sided facet spurring. Mild moderate right foraminal narrowing. Patent spinal canal. L2-L3: Advanced asymmetric rightward disc narrowing and far-lateral endplate spurring. Asymmetric right facet spurring. Right subarticular recess impingement on L3. Mild right foraminal narrowing. L3-L4: Advanced disc degeneration and endplate irregularity. Facet arthropathy with severe joint distortion. Advanced spinal stenosis with flattening of the thecal sac. Mild left and moderate right foraminal narrowing. L4-L5: Disc degeneration with asymmetric leftward narrowing and far-lateral spurring. Advanced facet arthropathy with asymmetric hypertrophy on the left. Left subarticular recess impingement on L5. Moderate left foraminal narrowing. L5-S1:Advanced degenerative disc narrowing with endplate ridging. Asymmetric left facet spurring with moderate hypertrophy. Left foraminal L5 compression. IMPRESSION: 1. Severe degenerative changes with levoscoliosis. 2. L2-3 right subarticular recess impingement which could affect L3. 3. L3-4 severe spinal stenosis. Moderate right foraminal impingement. 4. L4-5 left subarticular recess and foraminal impingement. 5. L5-S1 left foraminal L5 compression. 6. Axial T1  weighted images could not be obtained due to patient discomfort. Electronically Signed   By: Marnee Spring M.D.   On: 01/13/2017 18:49     Assessment/Plan   ICD-10-CM   1. Numbness and tingling of left leg R20.0 gabapentin (NEURONTIN) 300 MG capsule   R20.2   2. Hypothyroidism due to acquired atrophy of thyroid E03.4 levothyroxine (SYNTHROID, LEVOTHROID) 137 MCG tablet    TSH    T4, Free  3. Seasonal allergic rhinitis due to pollen J30.1   4. PAF (paroxysmal atrial fibrillation) (HCC) I48.0   5. Spinal stenosis of lumbar region with neurogenic claudication M48.062 traMADol (ULTRAM) 50 MG tablet  6. Primary osteoarthritis, unspecified site M19.91 traMADol (ULTRAM) 50 MG tablet    INCREASE GABAPENTIN  AT BEDTIME  Continue other medications as ordered  Follow up with specialists as scheduled  Follow up in 3 mos for thyroid, PAF, seasonal allergy, lumbar stenosis with claudication  Arnetra Terris S. Ancil Linsey  [facility] [address] [place], [place] [address] [phone] Cell (Monday-Friday 8 AM - 5 PM) [phone] After 5 PM and follow prompts

## 2017-02-07 NOTE — Patient Instructions (Addendum)
INCREASE GABAPENTIN  AT BEDTIME  Continue other medications as ordered  Follow up with specialists as scheduled  Follow up in 3 mos for thyroid, PAF, seasonal allergy, lumbar stenosis

## 2017-03-14 ENCOUNTER — Encounter (HOSPITAL_COMMUNITY): Payer: Self-pay | Admitting: Nurse Practitioner

## 2017-03-14 ENCOUNTER — Ambulatory Visit (HOSPITAL_COMMUNITY)
Admission: RE | Admit: 2017-03-14 | Discharge: 2017-03-14 | Disposition: A | Discharge status: Home / Self Care | Payer: Medicare Other | Source: Ambulatory Visit | Attending: Nurse Practitioner | Admitting: Nurse Practitioner

## 2017-03-14 VITALS — BP 118/68 | HR 106 | Ht 63.0 in | Wt 148.0 lb

## 2017-03-14 DIAGNOSIS — Z96612 Presence of left artificial shoulder joint: Secondary | ICD-10-CM | POA: Insufficient documentation

## 2017-03-14 DIAGNOSIS — Z7901 Long term (current) use of anticoagulants: Secondary | ICD-10-CM | POA: Diagnosis not present

## 2017-03-14 DIAGNOSIS — I482 Chronic atrial fibrillation: Secondary | ICD-10-CM

## 2017-03-14 DIAGNOSIS — Z87891 Personal history of nicotine dependence: Secondary | ICD-10-CM | POA: Diagnosis not present

## 2017-03-14 DIAGNOSIS — E039 Hypothyroidism, unspecified: Secondary | ICD-10-CM | POA: Diagnosis not present

## 2017-03-14 DIAGNOSIS — Z96651 Presence of right artificial knee joint: Secondary | ICD-10-CM | POA: Insufficient documentation

## 2017-03-14 DIAGNOSIS — Z96641 Presence of right artificial hip joint: Secondary | ICD-10-CM | POA: Diagnosis not present

## 2017-03-14 DIAGNOSIS — Z96611 Presence of right artificial shoulder joint: Secondary | ICD-10-CM | POA: Diagnosis not present

## 2017-03-14 DIAGNOSIS — I4821 Permanent atrial fibrillation: Secondary | ICD-10-CM

## 2017-03-14 NOTE — Progress Notes (Signed)
Patient ID: Katrina Velez, female   DOB: [birth year], 83 y.o.   MRN: [id number]       Primary Care Physician: Kirt Boysarter, Monica, DO Referring Physician: Dr. Laqueta JeanAllred   Katrina Velez is a 83 y.o. female with a h/o afib that was loaded on amiodarone and successfully cardioverted 4/13, but with ERAF. She did not see that much difference in SR and had some thyroid concerns  already on replacement, so decision was made to stop amiodarone and aim for rate control,HR has been staying below 100 at home. Her energy is good for her age and she is back to hanging clothes outside. No issues with anticoagulant.   Returns to afib clinic 8/23, after she has noticed at home some fluctuation in heart rate. Her daughter on taking her heart rate and Bp one day, noticed that hr heart rate was 48 bpm, although the pt did not feel bad. She talked to the on call provider and was advised to skip the next rate control drug. A few days later, the pt had a nonsustained episode of rvr at 150 bpm. She did feel lightheaded with that episode. On call provider was contacted and she was told to how to take extra rate control if needed. However after a few more minutes, her HR slowed down.  EKG showed afib with controlled afib. She is also upset because she feels that she needs more thyroid replacement. She has also felt better on a higher dose than what the labs suggest that she takes. Her synthroid was reduced several months back and the pt is c/o thinning hair, thinning nails, more fatigue, depression. She is planning to change to a new MD in the first of October to see if she can get straightened out with her thyroid.   Returns 9/6, holter monitor placed on previous visit and reviewed. Shows average HR around 107 bpm, high rate 160 bpm, minimum 40 bpm at 4:30 am. Pt was not aware of fast or slow heart rate.She could benefit from more rate control. She is still able to do her house work with minimal symptoms and feels that she has good rate  control.   F/u in the afib clinic 10/25 and has good rate control. Currently is on metoprolol ER 25 mg bid and cardizem 120 mg bid. Tried higher doses of BB but could not tolerate. She is tolerating this combination of rate control with HR in the 80's.   F/u in afib clinic 10/24, she continues to live in permanent rate controlled afib. HR at 106 on arrival, but in the 80's at rest which is reasonable rate control for the pt.and what she sees at home. She is having no issues with ELiquis.  Today, she denies symptoms of palpitations, chest pain, shortness of breath, orthopnea, PND, lower extremity edema, dizziness, presyncope, syncope, or neurologic sequela. The patient is tolerating medications without difficulties and is otherwise without complaint today.   Past Medical History:  Diagnosis Date  . A-fib (HCC) 05/2015   HOSPITALIZED   . Arthritis   . Cataract   . Hypothyroidism   . Permanent atrial fibrillation (HCC)   . Thyroid disease    Past Surgical History:  Procedure Laterality Date  . CARDIOVERSION N/A 06/08/2015   Procedure: CARDIOVERSION;  Surgeon: Jake BatheMark C Skains, MD;  [facility];  Service: Cardiovascular;  Laterality: N/A;  . CARDIOVERSION N/A 09/02/2015   Procedure: CARDIOVERSION;  Surgeon: Laqueta LindenSuresh A Koneswaran, MD;  [facility];  Service: Cardiovascular;  Laterality: N/A;  .  CATARACT EXTRACTION, BILATERAL Bilateral 2010  . JOINT REPLACEMENT    . KNEE SURGERY  2002  . right hip replacement    . RIGHT KNEE REPLACEMENT  2011  . TEE WITHOUT CARDIOVERSION N/A 06/08/2015   Procedure: TRANSESOPHAGEAL ECHOCARDIOGRAM (TEE);  Surgeon: Jake Bathe, MD;  Location[facility] ENDOSCOPY;  Service: Cardiovascular;  Laterality: N/A;  . TOTAL HIP ARTHROPLASTY Right 2011  . TOTAL SHOULDER REPLACEMENT Right 2003  . TOTAL SHOULDER REPLACEMENT Left 2007  . WRIST SURGERY Bilateral 2008   both wrists    Current Outpatient Prescriptions  Medication Sig Dispense Refill  .  apixaban (ELIQUIS) 5 MG TABS tablet Take 1 tablet (5 mg total) by mouth 2 (two) times daily. 180 tablet 3  . ascorbic acid (VITAMIN C) 500 MG tablet Take 1,000 mg by mouth daily.    . cholecalciferol (VITAMIN D) 1000 units tablet Take 1,000 Units by mouth daily.    Marland Kitchen diltiazem (CARDIZEM LA) 120 MG 24 hr tablet Take 1 tablet (120 mg total) by mouth 2 (two) times daily. 180 tablet 3  . fluticasone (FLONASE) 50 MCG/ACT nasal spray Place 1 spray into both nostrils daily. 16 g 6  . furosemide (LASIX) 20 MG tablet TAKE 1 TABLET DAILY AS NEEDED FOR WEIGHT GAIN >3LBS. 30 tablet 3  . gabapentin (NEURONTIN) 300 MG capsule Take 1 capsule (300 mg total) by mouth at bedtime. 90 capsule 3  . levothyroxine (SYNTHROID, LEVOTHROID) 137 MCG tablet Take 1 tablet (137 mcg total) by mouth daily before breakfast. 30 tablet 3  . metoprolol succinate (TOPROL XL) 25 MG 24 hr tablet Take 1 tablet by mouth twice a day 180 tablet 3  . Multiple Vitamin (MULTIVITAMIN) capsule Take 1 capsule by mouth daily.    . traMADol (ULTRAM) 50 MG tablet Take 1 tablet (50 mg total) by mouth every 8 (eight) hours as needed. for pain 60 tablet 0  . vitamin E 400 UNIT capsule Take 400 Units by mouth daily.     No current facility-administered medications for this encounter.     Allergies  Allergen Reactions  . Banana Nausea Only    All banana products also  . Penicillins Hives    Social History   Social History  . Marital status: Widowed    Spouse name: N/A  . Number of children: N/A  . Years of education: N/A   Occupational History  . Not on file.   Social History Main Topics  . Smoking status: Former Smoker    Packs/day: 1.00    Years: 65.00    Types: Cigarettes    Quit date: 05/28/2011  . Smokeless tobacco: Never Used  . Alcohol use 0.6 - 1.2 oz/week    1 - 2 Glasses of wine per week     Comment: occasional drink  . Drug use: No  . Sexual activity: Not on file   Other Topics Concern  . Not on file   Social  History Narrative   DIET: NONE      DO YOU DRINK/EAT THINGS WITH CAFFEINE: YES, COFFEE AND TEA       MARITAL STATUS: WIDOWED      WHAT YEAR WERE YOU MARRIED:1950      DO YOU LIVE IN A HOUSE, APARTMENT, ASSISTED LIVING, CONDO TRAILER ETC.: HOUSE       IS IT ONE OR MORE STORIES: 2 STORIES      HOW MANY PERSONS LIVE IN YOUR HOME: 1      DO YOU HAVE PETS  IN YOUR HOME: 1 CAT AND A PART TIME DOG       CURRENT OR PAST PROFESSION: CIVIL SERVANT      DO YOU EXERCISE: NO       WHAT TYPE AND HOW OFTEN:NO    Family History  Problem Relation Age of Onset  . Hypercalcemia Daughter   . Hypertension Daughter     ROS- All systems are reviewed and negative except as per the HPI above  Physical Exam: Vitals:   03/14/17 1010  BP: 118/68  Pulse: (!) 106  Weight: 148 lb (67.1 kg)  Height: 5\' 3"  (1.6 m)    GEN- The patient is well appearing, alert and oriented x 3 today.   Head- normocephalic, atraumatic Eyes-  Sclera clear, conjunctiva pink Ears- hearing intact Oropharynx- clear Neck- supple, no JVP Lymph- no cervical lymphadenopathy Lungs- Clear to ausculation bilaterally, normal work of breathing Heart- Irregular rate and rhythm, no murmurs, rubs or gallops, PMI not laterally displaced GI- soft, NT, ND, + BS Extremities- no clubbing, cyanosis, or edema MS- no significant deformity or atrophy Skin- no rash or lesion Psych- euthymic mood, full affect Neuro- strength and sensation are intact  EKG-afib at 106 bpm,   qrs int 78 ms, qtc 430 ms Epic records reviewed Holter monitor reviewed  Assessment and Plan: 1.Chronic afib Continue metoprolol ER 25 mg bid and cardizem 120 mg bid, appears to have HR well controlled with this dose Continue eliquis 5 mg bid, Creatinine last checked 12/2016 and stable at 0.84 ms, no bleeding issues, remains on appropriate dose with weight at 148 lb, age over 46  2. Hypothyroidism  Per PCP and pt pt stable  F/u 6 months  Lupita Leash C. Matthew Folks [facility] [address] [place], [place] [address] [phone]

## 2017-03-23 DIAGNOSIS — Z23 Encounter for immunization: Secondary | ICD-10-CM | POA: Diagnosis not present

## 2017-04-18 DIAGNOSIS — H04123 Dry eye syndrome of bilateral lacrimal glands: Secondary | ICD-10-CM | POA: Diagnosis not present

## 2017-04-18 DIAGNOSIS — Z961 Presence of intraocular lens: Secondary | ICD-10-CM | POA: Diagnosis not present

## 2017-05-08 ENCOUNTER — Ambulatory Visit (INDEPENDENT_AMBULATORY_CARE_PROVIDER_SITE_OTHER): Payer: Medicare Other | Admitting: Internal Medicine

## 2017-05-08 ENCOUNTER — Encounter: Payer: Self-pay | Admitting: Internal Medicine

## 2017-05-08 VITALS — BP 118/80 | HR 83 | Temp 97.7°F | Ht 63.0 in | Wt 148.0 lb

## 2017-05-08 DIAGNOSIS — M48062 Spinal stenosis, lumbar region with neurogenic claudication: Secondary | ICD-10-CM | POA: Diagnosis not present

## 2017-05-08 DIAGNOSIS — I480 Paroxysmal atrial fibrillation: Secondary | ICD-10-CM | POA: Diagnosis not present

## 2017-05-08 DIAGNOSIS — J301 Allergic rhinitis due to pollen: Secondary | ICD-10-CM

## 2017-05-08 DIAGNOSIS — M1991 Primary osteoarthritis, unspecified site: Secondary | ICD-10-CM

## 2017-05-08 DIAGNOSIS — E034 Atrophy of thyroid (acquired): Secondary | ICD-10-CM | POA: Diagnosis not present

## 2017-05-08 MED ORDER — TRAMADOL HCL 50 MG PO TABS: 50.0000 mg | ORAL_TABLET | Freq: Three times a day (TID) | ORAL | 0 refills | Status: DC | PRN

## 2017-05-08 MED ORDER — SYNTHROID 137 MCG PO TABS: 137.0000 ug | ORAL_TABLET | Freq: Every day | ORAL | 2 refills | Status: DC

## 2017-05-08 NOTE — Patient Instructions (Addendum)
Continue current medications as ordered  Follow up with specialists as scheduled  Follow up in 3 mos for thyroid, hx CHF, arthritis

## 2017-05-08 NOTE — Progress Notes (Signed)
Patient ID: Katrina Velez, female   DOB: 1925-11-14, 83 y.o.   MRN: [id number]   [facility]  Provider: DR Elmon KirschnerMONICA S Ephraim Velez   Goals of Care:  Advanced Directives 05/08/2017  Does Patient Have a Medical Advance Directive? No  Does patient want to make changes to medical advance directive? -  Would patient like information on creating a medical advance directive? Yes (ED - Information included in AVS)  Pre-existing out of facility DNR order (yellow form or pink MOST form) -     Chief Complaint  Patient presents with  . Medical Management of Chronic Issues    3 Month Follow up, Thyroid, PAF, Seasonal allergy, Lumbar Stenosis  . Advanced Directive    Request Information    HPI: Patient is a 83 y.o. female seen today for medical management of chronic diseases.  She is feeling better since synthroid dose increased. She is able to do more chores around the house.   Chronic back pain - MRI L spine in 12/2016 revealed severe DDD with left L5-S1 foraminal L5 compression; moderate-severe stenosis at L3-L4. She continues to have numbness in LLE  Seasonal allergic rhinitis - borderline controlled on OTC anthistamine and flonase. (+) rhinorrhea and post nasal drip  PAF - rate controlled on cardizem LA and metoprolol succ. She takes eliquis for anticoagulation. Followed by cardiology. She had a successful cardioversion in Arril 2017 but later reverted back to afib. She was hospitalized in Jan 2017 for afib w RVR, acute CHF and acute pulmonary edema. 2D echo showed EF 50-55%. Her last ECG in Apr 2018 showed afib.  Hx CHF - takes lasix daily prn "feeling bloated". Followed by cardiology  COPD/hx tobacco abuse - stable off meds. She no longer smokes  Arthritis - stable. Her pain is improved on tramadol prn and also uses topical OTC lidocaine on wrists for carpal tunnel discomfort. She has taken ASA in the past but was told not to combine it with eliquis. She is intolerant of tylenol.    Hypothyroidism - takes brand name only synthroid 137mcg daily. TSH 0.12; free T4 1.6. She no longer sees Endo. She is taking med fasting and 1st thing in the AM.    Past Medical History:  Diagnosis Date  . A-fib (HCC) 05/2015   HOSPITALIZED   . Arthritis   . Cataract   . Hypothyroidism   . Permanent atrial fibrillation (HCC)   . Thyroid disease     Past Surgical History:  Procedure Laterality Date  . CARDIOVERSION N/A 06/08/2015   Procedure: CARDIOVERSION;  Surgeon: Katrina BatheMark C Skains, MD;  [facility];  Service: Cardiovascular;  Laterality: N/A;  . CARDIOVERSION N/A 09/02/2015   Procedure: CARDIOVERSION;  Surgeon: Katrina LindenSuresh A Koneswaran, MD;  [facility];  Service: Cardiovascular;  Laterality: N/A;  . CATARACT EXTRACTION, BILATERAL Bilateral 2010  . JOINT REPLACEMENT    . KNEE SURGERY  2002  . right hip replacement    . RIGHT KNEE REPLACEMENT  2011  . TEE WITHOUT CARDIOVERSION N/A 06/08/2015   Procedure: TRANSESOPHAGEAL ECHOCARDIOGRAM (TEE);  Surgeon: Katrina BatheMark C Skains, MD;  [facility];  Service: Cardiovascular;  Laterality: N/A;  . TOTAL HIP ARTHROPLASTY Right 2011  . TOTAL SHOULDER REPLACEMENT Right 2003  . TOTAL SHOULDER REPLACEMENT Left 2007  . WRIST SURGERY Bilateral 2008   both wrists     reports that she quit smoking about 5 years ago. Her smoking use included cigarettes. She has a 65.00 pack-year smoking history. she has  never used smokeless tobacco. She reports that she drinks about 0.6 - 1.2 oz of alcohol per week. She reports that she does not use drugs. Social History   Socioeconomic History  . Marital status: Widowed    Spouse name: Not on file  . Number of children: Not on file  . Years of education: Not on file  . Highest education level: Not on file  Social Needs  . Financial resource strain: Not on file  . Food insecurity - worry: Not on file  . Food insecurity - inability: Not on file  . Transportation needs - medical: Not on  file  . Transportation needs - non-medical: Not on file  Occupational History  . Not on file  Tobacco Use  . Smoking status: Former Smoker    Packs/day: 1.00    Years: 65.00    Pack years: 65.00    Types: Cigarettes    Last attempt to quit: 05/28/2011    Years since quitting: 5.9  . Smokeless tobacco: Never Used  Substance and Sexual Activity  . Alcohol use: Yes    Alcohol/week: 0.6 - 1.2 oz    Types: 1 - 2 Glasses of wine per week    Comment: occasional drink  . Drug use: No  . Sexual activity: Not on file  Other Topics Concern  . Not on file  Social History Narrative   DIET: NONE      DO YOU DRINK/EAT THINGS WITH CAFFEINE: YES, COFFEE AND TEA       MARITAL STATUS: WIDOWED      WHAT YEAR WERE YOU MARRIED:1950      DO YOU LIVE IN A HOUSE, APARTMENT, ASSISTED LIVING, CONDO TRAILER ETC.: HOUSE       IS IT ONE OR MORE STORIES: 2 STORIES      HOW MANY PERSONS LIVE IN YOUR HOME: 1      DO YOU HAVE PETS IN YOUR HOME: 1 CAT AND A PART TIME DOG       CURRENT OR PAST PROFESSION: CIVIL SERVANT      DO YOU EXERCISE: NO       WHAT TYPE AND HOW OFTEN:NO    Family History  Problem Relation Age of Onset  . Hypercalcemia Daughter   . Hypertension Daughter     Allergies  Allergen Reactions  . Banana Nausea Only    All banana products also  . Penicillins Hives    Outpatient Encounter Medications as of 05/08/2017  Medication Sig  . apixaban (ELIQUIS) 5 MG TABS tablet Take 1 tablet (5 mg total) by mouth 2 (two) times daily.  Marland Kitchen ascorbic acid (VITAMIN C) 500 MG tablet Take 1,000 mg by mouth daily.  . cholecalciferol (VITAMIN D) 1000 units tablet Take 1,000 Units by mouth daily.  Marland Kitchen diltiazem (CARDIZEM LA) 120 MG 24 hr tablet Take 1 tablet (120 mg total) by mouth 2 (two) times daily.  . furosemide (LASIX) 20 MG tablet TAKE 1 TABLET DAILY AS NEEDED FOR WEIGHT GAIN >3LBS.  Marland Kitchen gabapentin (NEURONTIN) 300 MG capsule Take 1 capsule (300 mg total) by mouth at bedtime.  Marland Kitchen  levothyroxine (SYNTHROID, LEVOTHROID) 137 MCG tablet Take 1 tablet (137 mcg total) by mouth daily before breakfast.  . metoprolol succinate (TOPROL XL) 25 MG 24 hr tablet Take 1 tablet by mouth twice a day  . Multiple Vitamin (MULTIVITAMIN) capsule Take 1 capsule by mouth daily.  . traMADol (ULTRAM) 50 MG tablet Take 1 tablet (50 mg total) by mouth every 8 (  eight) hours as needed. for pain  . vitamin E 400 UNIT capsule Take 400 Units by mouth daily.  . [DISCONTINUED] fluticasone (FLONASE) 50 MCG/ACT nasal spray Place 1 spray into both nostrils daily. (Patient not taking: Reported on 05/08/2017)   No facility-administered encounter medications on file as of 05/08/2017.     Review of Systems:  Review of Systems  Musculoskeletal: Positive for arthralgias and back pain.  All other systems reviewed and are negative.   Health Maintenance  Topic Date Due  . TETANUS/TDAP  10/11/2026  . INFLUENZA VACCINE  Completed  . DEXA SCAN  Completed  . PNA vac Low Risk Adult  Completed    Physical Exam: Vitals:   05/08/17 1208  BP: 118/80  Pulse: 83  Temp: 97.7 F (36.5 C)  TempSrc: Oral  SpO2: 97%  Weight: 148 lb (67.1 kg)  Height: 5\' 3"  (1.6 m)   Body mass index is 26.22 kg/m. Physical Exam  Constitutional: She is oriented to person, place, and time. She appears well-developed and well-nourished.  HENT:  Mouth/Throat: Oropharynx is clear and moist. No oropharyngeal exudate.  MMM; no oral thrush  Eyes: Pupils are equal, round, and reactive to light. No scleral icterus.  Neck: Neck supple. Carotid bruit is not present. No tracheal deviation present. No thyromegaly present.  Cardiovascular: Normal rate, regular rhythm and intact distal pulses. Exam reveals no gallop and no friction rub.  Murmur (1/6 SEM) heard. No LE edema b/l. no calf TTP.   Pulmonary/Chest: Effort normal and breath sounds normal. No stridor. No respiratory distress. She has no wheezes. She has no rales.  Abdominal:  Soft. Normal appearance and bowel sounds are normal. She exhibits no distension and no mass. There is no hepatomegaly. There is no tenderness. There is no rigidity, no rebound and no guarding. No hernia.  Musculoskeletal: She exhibits edema and deformity (thoracic kyphoasis).  Lymphadenopathy:    She has no cervical adenopathy.  Neurological: She is alert and oriented to person, place, and time.  Skin: Skin is warm and dry. No rash noted.  Psychiatric: She has a normal mood and affect. Her behavior is normal. Judgment and thought content normal.    Labs reviewed: Basic Metabolic Panel: Recent Labs    10/04/16 1124 01/03/17 0955  NA 139 139  K 4.7 5.3  CL 102 101  CO2 30 22  GLUCOSE 89 106*  BUN 19 12  CREATININE 0.87 0.84  CALCIUM 10.0 10.2  TSH 0.09* 0.12*   Liver Function Tests: Recent Labs    10/04/16 1124 01/03/17 0955  AST 13 14  ALT 8 7  ALKPHOS 68 74  BILITOT 0.7 0.5  PROT 6.7 6.7  ALBUMIN 4.2 4.2   No results for input(s): LIPASE, AMYLASE in the last 8760 hours. No results for input(s): AMMONIA in the last 8760 hours. CBC: Recent Labs    10/04/16 1124  WBC 6.2  NEUTROABS 4,154  HGB 14.4  HCT 43.8  MCV 91.6  PLT 342   Lipid Panel: No results for input(s): CHOL, HDL, LDLCALC, TRIG, CHOLHDL, LDLDIRECT in the last 8760 hours. No results found for: HGBA1C  Procedures since last visit: No results found.  Assessment/Plan   ICD-10-CM   1. Hypothyroidism due to acquired atrophy of thyroid E03.4 SYNTHROID 137 MCG tablet  2. Primary osteoarthritis, unspecified site M19.91 traMADol (ULTRAM) 50 MG tablet  3. Spinal stenosis of lumbar region with neurogenic claudication M48.062 traMADol (ULTRAM) 50 MG tablet  4. PAF (paroxysmal atrial fibrillation) (HCC) I48.0  5. Seasonal allergic rhinitis due to pollen J30.1    Continue current medications as ordered  Follow up with specialists as scheduled  Follow up in 3 mos for thyroid, hx CHF,  arthritis   Delrose Rohwer S. Ancil Linsey  [facility] [address] [place], [place] [address] [phone] Cell (Monday-Friday 8 AM - 5 PM) [phone] After 5 PM and follow prompts

## 2017-05-11 ENCOUNTER — Encounter: Payer: Self-pay | Admitting: Internal Medicine

## 2017-05-11 DIAGNOSIS — M48062 Spinal stenosis, lumbar region with neurogenic claudication: Secondary | ICD-10-CM | POA: Insufficient documentation

## 2017-07-12 ENCOUNTER — Other Ambulatory Visit (HOSPITAL_COMMUNITY): Payer: Self-pay | Admitting: Nurse Practitioner

## 2017-08-04 ENCOUNTER — Emergency Department (HOSPITAL_COMMUNITY)
Admission: EM | Admit: 2017-08-04 | Discharge: 2017-08-04 | Disposition: A | Discharge status: Home / Self Care | Payer: Medicare Other | Attending: Emergency Medicine | Admitting: Emergency Medicine

## 2017-08-04 ENCOUNTER — Other Ambulatory Visit: Payer: Self-pay

## 2017-08-04 ENCOUNTER — Encounter (HOSPITAL_COMMUNITY): Payer: Self-pay | Admitting: Emergency Medicine

## 2017-08-04 DIAGNOSIS — Z7901 Long term (current) use of anticoagulants: Secondary | ICD-10-CM | POA: Insufficient documentation

## 2017-08-04 DIAGNOSIS — Y9301 Activity, walking, marching and hiking: Secondary | ICD-10-CM | POA: Diagnosis not present

## 2017-08-04 DIAGNOSIS — E039 Hypothyroidism, unspecified: Secondary | ICD-10-CM | POA: Diagnosis not present

## 2017-08-04 DIAGNOSIS — Z79899 Other long term (current) drug therapy: Secondary | ICD-10-CM | POA: Diagnosis not present

## 2017-08-04 DIAGNOSIS — Z87891 Personal history of nicotine dependence: Secondary | ICD-10-CM | POA: Insufficient documentation

## 2017-08-04 DIAGNOSIS — Y92008 Other place in unspecified non-institutional (private) residence as the place of occurrence of the external cause: Secondary | ICD-10-CM | POA: Insufficient documentation

## 2017-08-04 DIAGNOSIS — Y998 Other external cause status: Secondary | ICD-10-CM | POA: Diagnosis not present

## 2017-08-04 DIAGNOSIS — W228XXA Striking against or struck by other objects, initial encounter: Secondary | ICD-10-CM | POA: Insufficient documentation

## 2017-08-04 DIAGNOSIS — T148XXA Other injury of unspecified body region, initial encounter: Secondary | ICD-10-CM | POA: Diagnosis not present

## 2017-08-04 DIAGNOSIS — S81811A Laceration without foreign body, right lower leg, initial encounter: Secondary | ICD-10-CM | POA: Insufficient documentation

## 2017-08-04 DIAGNOSIS — S8991XA Unspecified injury of right lower leg, initial encounter: Secondary | ICD-10-CM | POA: Diagnosis not present

## 2017-08-04 MED ORDER — LIDOCAINE-EPINEPHRINE (PF) 2 %-1:200000 IJ SOLN
10.0000 mL | Freq: Once | INTRAMUSCULAR | Status: AC
  Administered 2017-08-04: 10 mL via INTRADERMAL
  Filled 2017-08-04: qty 20

## 2017-08-04 NOTE — ED Triage Notes (Signed)
Pt was on deck during her birthday party and hit right lateral leg on a chair, tearing the skin. Patient is on Eliquis. Bleeding is now controlled. Vitals stable. Hx of Afib

## 2017-08-04 NOTE — ED Provider Notes (Signed)
[facility]-EMERGENCY DEPT Provider Note   CSN: [id number] Arrival date & time: 08/04/17  1727     History   Chief Complaint Chief Complaint  Patient presents with  . Right Leg Laceration    HPI Katrina Velez is a 84 y.o. female.  HPI Katrina Velez is a 84 y.o. female presents to emergency department with complaint of a leg laceration.  She states that she was walking on the deck and hit her right leg on a chair.  She states she noticed immediate bleeding.  Patient with a large flap laceration to the right lower pressure was applied, wound is now hemostatic.  Patient states her tetanus is up-to-date.  She did not fall.  No other injuries.  No other complaints.  Past Medical History:  Diagnosis Date  . A-fib (HCC) 05/2015   HOSPITALIZED   . Arthritis   . Cataract   . Hypothyroidism   . Permanent atrial fibrillation (HCC)   . Thyroid disease     Patient Active Problem List   Diagnosis Date Noted  . Spinal stenosis of lumbar region with neurogenic claudication 05/11/2017  . Seasonal allergic rhinitis due to pollen 10/04/2016  . High risk medication use 10/04/2016  . Osteoarthritis 03/01/2016  . Iatrogenic hyperthyroidism 03/01/2016  . Atrial fibrillation (HCC) 06/05/2015  . Nonspecific abnormal electrocardiogram (ECG) (EKG) 05/27/2014  . Elevated glucose 03/26/2012  . Hypothyroidism post removal of goiter 01/31/2012  . Nicotine addiction 01/31/2012  . Inflammatory arthritis 01/31/2012    Past Surgical History:  Procedure Laterality Date  . CARDIOVERSION N/A 06/08/2015   Procedure: CARDIOVERSION;  Surgeon: Jake Bathe, MD;  Location: [facility] ENDOSCOPY;  Service: Cardiovascular;  Laterality: N/A;  . CARDIOVERSION N/A 09/02/2015   Procedure: CARDIOVERSION;  Surgeon: Laqueta Linden, MD;  Location[facility];  Service: Cardiovascular;  Laterality: N/A;  . CATARACT EXTRACTION, BILATERAL Bilateral 2010  . JOINT REPLACEMENT    . KNEE SURGERY  2002  .  right hip replacement    . RIGHT KNEE REPLACEMENT  2011  . TEE WITHOUT CARDIOVERSION N/A 06/08/2015   Procedure: TRANSESOPHAGEAL ECHOCARDIOGRAM (TEE);  Surgeon: Jake Bathe, MD;  Location: [facility] ENDOSCOPY;  Service: Cardiovascular;  Laterality: N/A;  . TOTAL HIP ARTHROPLASTY Right 2011  . TOTAL SHOULDER REPLACEMENT Right 2003  . TOTAL SHOULDER REPLACEMENT Left 2007  . WRIST SURGERY Bilateral 2008   both wrists    OB History    No data available       Home Medications    Prior to Admission medications   Medication Sig Start Date End Date Taking? Authorizing Provider  apixaban (ELIQUIS) 5 MG TABS tablet Take 1 tablet (5 mg total) by mouth 2 (two) times daily. 09/13/16   Newman Nip, NP  ascorbic acid (VITAMIN C) 500 MG tablet Take 1,000 mg by mouth daily.    [provider]  cholecalciferol (VITAMIN D) 1000 units tablet Take 1,000 Units by mouth daily.    [provider]  diltiazem (CARDIZEM LA) 120 MG 24 hr tablet Take 1 tablet (120 mg total) by mouth 2 (two) times daily. 09/13/16   Newman Nip, NP  furosemide (LASIX) 20 MG tablet TAKE 1 TABLET DAILY AS NEEDED FOR WEIGHT GAIN >3LBS. 07/13/17   Newman Nip, NP  gabapentin (NEURONTIN) 300 MG capsule Take 1 capsule (300 mg total) by mouth at bedtime. 02/07/17   Kirt Boys, DO  metoprolol succinate (TOPROL XL) 25 MG 24 hr tablet Take 1 tablet by mouth  twice a day 09/13/16   Newman Nip, NP  Multiple Vitamin (MULTIVITAMIN) capsule Take 1 capsule by mouth daily.    [provider]  SYNTHROID 137 MCG tablet Take 1 tablet (137 mcg total) by mouth daily before breakfast. 05/08/17   Kirt Boys, DO  traMADol (ULTRAM) 50 MG tablet Take 1 tablet (50 mg total) by mouth every 8 (eight) hours as needed. for pain 05/08/17   Kirt Boys, DO  vitamin E 400 UNIT capsule Take 400 Units by mouth daily.    [provider]    Family History Family History  Problem Relation Age of Onset  .  Hypercalcemia Daughter   . Hypertension Daughter     Social History Social History   Tobacco Use  . Smoking status: Former Smoker    Packs/day: 1.00    Years: 65.00    Pack years: 65.00    Types: Cigarettes    Last attempt to quit: 05/28/2011    Years since quitting: 6.1  . Smokeless tobacco: Never Used  Substance Use Topics  . Alcohol use: Yes    Alcohol/week: 0.6 - 1.2 oz    Types: 1 - 2 Glasses of wine per week    Comment: occasional drink  . Drug use: No     Allergies   Banana and Penicillins   Review of Systems Review of Systems  Constitutional: Negative for chills and fever.  Skin: Positive for wound.  Neurological: Negative for weakness and numbness.  Hematological: Bruises/bleeds easily.     Physical Exam Updated Vital Signs BP (!) 144/83 (BP Location: Left Arm)   Pulse 96   Temp 98.4 F (36.9 C) (Oral)   Resp 18   SpO2 95%   Physical Exam  Constitutional: She appears well-developed and well-nourished. No distress.  Eyes: Conjunctivae are normal.  Neck: Neck supple.  Neurological: She is alert.  Skin: Skin is warm and dry.  Right lower leg large flap laceration through subcutaneous and fatty tissue  Nursing note and vitals reviewed.    ED Treatments / Results  Labs (all labs ordered are listed, but only abnormal results are displayed) Labs Reviewed - No data to display  EKG  EKG Interpretation None       Radiology No results found.  Procedures .Marland KitchenLaceration Repair Date/Time: 08/04/2017 7:29 PM Performed by: Jaynie Crumble, PA-C Authorized by: Jaynie Crumble, PA-C   Consent:    Consent obtained:  Verbal   Consent given by:  Patient   Risks discussed:  Pain, need for additional repair, poor cosmetic result and poor wound healing   Alternatives discussed:  No treatment Laceration details:    Location:  Leg   Leg location:  R lower leg   Length (cm):  10 Repair type:    Repair type:  Complex Pre-procedure details:     Preparation:  Patient was prepped and draped in usual sterile fashion Exploration:    Hemostasis achieved with:  Epinephrine and direct pressure   Wound exploration: wound explored through full range of motion     Wound extent: no fascia violation noted, no foreign bodies/material noted, no muscle damage noted, no nerve damage noted and no vascular damage noted     Contaminated: no   Treatment:    Area cleansed with:  Saline   Amount of cleaning:  Standard   Irrigation solution:  Sterile saline   Irrigation method:  Syringe   Debridement:  None Subcutaneous repair:    Suture size:  4-0  Suture material:  Fast-absorbing gut   Number of sutures:  1 Skin repair:    Repair method:  Sutures   Suture size:  4-0   Suture material:  Prolene   Suture technique:  Horizontal mattress   Number of sutures:  5 Approximation:    Approximation:  Close Post-procedure details:    Dressing:  Sterile dressing   Patient tolerance of procedure:  Tolerated well, no immediate complications Comments:     5 horizontal mattress and 3 simple interrupted   (including critical care time)  Medications Ordered in ED Medications  lidocaine-EPINEPHrine (XYLOCAINE W/EPI) 2 %-1:200000 (PF) injection 10 mL (not administered)     Initial Impression / Assessment and Plan / ED Course  I have reviewed the triage vital signs and the nursing notes.  Pertinent labs & imaging results that were available during my care of the patient were reviewed by me and considered in my medical decision making (see chart for details).     Pt with laceration to the right lower leg. Large deep flap laceration. Irrigated. Repaired with sutures. sterri strips on top. Wound does not appear to be contaminated. Discussed with Dr. Silverio LayYao, do not think nee dto start on antibiotics. Home with PCP follow up. She has an apt in 3 days with PCP, will have her recheck it then. Wound care discussed.   Vitals:   08/04/17 1738  BP: (!) 144/83    Pulse: 96  Resp: 18  Temp: 98.4 F (36.9 C)  TempSrc: Oral  SpO2: 95%     Final Clinical Impressions(s) / ED Diagnoses   Final diagnoses:  Laceration of right lower leg, initial encounter    ED Discharge Orders    None       Jaynie CrumbleKirichenko, Charlissa Petros, PA-C 08/04/17 Virl Son1935    Yao, David Hsienta, MD 08/05/17 [phone]

## 2017-08-04 NOTE — Discharge Instructions (Signed)
Keep wound clean and dry. Bacitracin twice a day. See instructions below. Follow up with primary care doctor as scheduled on Tuesday for recheck. Sutures out in 10-14 days.

## 2017-08-04 NOTE — ED Notes (Signed)
Bed: [id number] Expected date: 08/04/17 Expected time: 5:23 PM Means of arrival: Ambulance Comments: Leg lac, on thinners

## 2017-08-07 ENCOUNTER — Ambulatory Visit (INDEPENDENT_AMBULATORY_CARE_PROVIDER_SITE_OTHER): Payer: Medicare Other | Admitting: Nurse Practitioner

## 2017-08-07 ENCOUNTER — Ambulatory Visit: Payer: Medicare Other | Admitting: Internal Medicine

## 2017-08-07 ENCOUNTER — Encounter: Payer: Self-pay | Admitting: Nurse Practitioner

## 2017-08-07 VITALS — BP 118/74 | HR 82 | Temp 97.5°F | Ht 63.0 in | Wt 151.8 lb

## 2017-08-07 DIAGNOSIS — S81812D Laceration without foreign body, left lower leg, subsequent encounter: Secondary | ICD-10-CM

## 2017-08-07 DIAGNOSIS — Z5189 Encounter for other specified aftercare: Secondary | ICD-10-CM | POA: Diagnosis not present

## 2017-08-07 NOTE — Patient Instructions (Addendum)
To use nonstick dressing over area, then cover with curlex wrap.   To monitor for increase redness, heat and purulent drainage.   To make appt next week for suture removal- would like to see Dr Montez Moritaarter

## 2017-08-07 NOTE — Progress Notes (Signed)
Careteam: Patient Care Team: Kirt Boys, DO as PCP - General (Internal Medicine) Sallye Lat, MD as Consulting Physician (Ophthalmology) Newman Nip, NP as Nurse Practitioner (Cardiology)  Advanced Directive information    Allergies  Allergen Reactions  . Banana Nausea Only    All banana products also  . Penicillins Hives    Chief Complaint  Patient presents with  . Follow-up    ED F/U [facility] ED 08/04/17; wound check; Patient with daughter  . Medication Refill    No Refills     HPI: Patient is a 84 y.o. female seen in the office today to follow up ED visit/wound checkl. Pt went to ED on 08/04/17 after she got up from a chair and had a large leg laceration. Pt was unaware of hitting her leg on anything and did not have pain but noticed a lot of blood. Noted to have complex wound about 10 cm fat exposed and sutures placed.  Pt reports she is not having any increase in pain to wound. No increase in redness or drainage noted.  No fevers or chills.  Had left the original bandage on from ED, taken off today with saline due to dried blood.   Review of Systems:  Review of Systems  Musculoskeletal: Negative for falls.  Skin: Negative for itching and rash.       Sutured laceration to right lower leg, no increase in pain, swelling, tenderness, erythema or pain.     Past Medical History:  Diagnosis Date  . A-fib (HCC) 05/2015   HOSPITALIZED   . Arthritis   . Cataract   . Hypothyroidism   . Permanent atrial fibrillation (HCC)   . Thyroid disease    Past Surgical History:  Procedure Laterality Date  . CARDIOVERSION N/A 06/08/2015   Procedure: CARDIOVERSION;  Surgeon: Jake Bathe, MD;  Location: [facility] ENDOSCOPY;  Service: Cardiovascular;  Laterality: N/A;  . CARDIOVERSION N/A 09/02/2015   Procedure: CARDIOVERSION;  Surgeon: Laqueta Linden, MD;  Location: [facility] ENDOSCOPY;  Service: Cardiovascular;  Laterality: N/A;  . CATARACT EXTRACTION, BILATERAL Bilateral 2010   . JOINT REPLACEMENT    . KNEE SURGERY  2002  . right hip replacement    . RIGHT KNEE REPLACEMENT  2011  . TEE WITHOUT CARDIOVERSION N/A 06/08/2015   Procedure: TRANSESOPHAGEAL ECHOCARDIOGRAM (TEE);  Surgeon: Jake Bathe, MD;  Location: [facility] ENDOSCOPY;  Service: Cardiovascular;  Laterality: N/A;  . TOTAL HIP ARTHROPLASTY Right 2011  . TOTAL SHOULDER REPLACEMENT Right 2003  . TOTAL SHOULDER REPLACEMENT Left 2007  . WRIST SURGERY Bilateral 2008   both wrists   Social History:   reports that she quit smoking about 6 years ago. Her smoking use included cigarettes. She has a 65.00 pack-year smoking history. she has never used smokeless tobacco. She reports that she drinks about 0.6 - 1.2 oz of alcohol per week. She reports that she does not use drugs.  Family History  Problem Relation Age of Onset  . Hypercalcemia Daughter   . Hypertension Daughter     Medications: Patient's Medications  New Prescriptions   No medications on file  Previous Medications   APIXABAN (ELIQUIS) 5 MG TABS TABLET    Take 1 tablet (5 mg total) by mouth 2 (two) times daily.   ASCORBIC ACID (VITAMIN C) 500 MG TABLET    Take 1,000 mg by mouth daily.   CHOLECALCIFEROL (VITAMIN D) 1000 UNITS TABLET    Take 1,000 Units by mouth daily.   DILTIAZEM (CARDIZEM  LA) 120 MG 24 HR TABLET    Take 1 tablet (120 mg total) by mouth 2 (two) times daily.   FUROSEMIDE (LASIX) 20 MG TABLET    TAKE 1 TABLET DAILY AS NEEDED FOR WEIGHT GAIN >3LBS.   GABAPENTIN (NEURONTIN) 300 MG CAPSULE    Take 1 capsule (300 mg total) by mouth at bedtime.   METOPROLOL SUCCINATE (TOPROL XL) 25 MG 24 HR TABLET    Take 1 tablet by mouth twice a day   MULTIPLE VITAMIN (MULTIVITAMIN) CAPSULE    Take 1 capsule by mouth daily.   SYNTHROID 137 MCG TABLET    Take 1 tablet (137 mcg total) by mouth daily before breakfast.   TRAMADOL (ULTRAM) 50 MG TABLET    Take 1 tablet (50 mg total) by mouth every 8 (eight) hours as needed. for pain   VITAMIN E 400 UNIT  CAPSULE    Take 400 Units by mouth daily.  Modified Medications   No medications on file  Discontinued Medications   No medications on file     Physical Exam:  Vitals:   08/07/17 1055  BP: 118/74  Pulse: 82  Temp: (!) 97.5 F (36.4 C)  TempSrc: Oral  SpO2: 97%  Weight: 151 lb 12.8 oz (68.9 kg)  Height: 5\' 3"  (1.6 m)   Body mass index is 26.89 kg/m.  Physical Exam  Constitutional: She appears well-developed and well-nourished.  Cardiovascular: Normal rate, regular rhythm and normal heart sounds.  Pulmonary/Chest: Effort normal and breath sounds normal.  Musculoskeletal: She exhibits no edema.       Legs: Sutured laceration to right lower leg, no erythema, drainage or tenderness noted.     Labs reviewed: Basic Metabolic Panel: Recent Labs    10/04/16 1124 01/03/17 0955  NA 139 139  K 4.7 5.3  CL 102 101  CO2 30 22  GLUCOSE 89 106*  BUN 19 12  CREATININE 0.87 0.84  CALCIUM 10.0 10.2  TSH 0.09* 0.12*   Liver Function Tests: Recent Labs    10/04/16 1124 01/03/17 0955  AST 13 14  ALT 8 7  ALKPHOS 68 74  BILITOT 0.7 0.5  PROT 6.7 6.7  ALBUMIN 4.2 4.2   No results for input(s): LIPASE, AMYLASE in the last 8760 hours. No results for input(s): AMMONIA in the last 8760 hours. CBC: Recent Labs    10/04/16 1124  WBC 6.2  NEUTROABS 4,154  HGB 14.4  HCT 43.8  MCV 91.6  PLT 342   Lipid Panel: No results for input(s): CHOL, HDL, LDLCALC, TRIG, CHOLHDL, LDLDIRECT in the last 8760 hours. TSH: Recent Labs    10/04/16 1124 01/03/17 0955  TSH 0.09* 0.12*   A1C: No results found for: HGBA1C   Assessment/Plan Laceration of left lower leg, subsequent encounter Visit for wound check  Wound without signs of infection, will need to follow up on 08/13/17 for suture removal. Okay to shower- to remove and change dressing (nonstick bandage) daily.  To monitor and notify for increase redness, heat or drainage.   Next appt: 08/14/2017 Janene HarveyJessica K. Biagio BorgEubanks,  AGNP  [facility] [phone]

## 2017-08-09 ENCOUNTER — Other Ambulatory Visit (HOSPITAL_COMMUNITY): Payer: Self-pay | Admitting: Nurse Practitioner

## 2017-08-14 ENCOUNTER — Ambulatory Visit (INDEPENDENT_AMBULATORY_CARE_PROVIDER_SITE_OTHER): Payer: Medicare Other | Admitting: Internal Medicine

## 2017-08-14 ENCOUNTER — Encounter: Payer: Self-pay | Admitting: Internal Medicine

## 2017-08-14 ENCOUNTER — Telehealth: Payer: Self-pay

## 2017-08-14 ENCOUNTER — Ambulatory Visit: Payer: Medicare Other | Admitting: Internal Medicine

## 2017-08-14 VITALS — BP 118/64 | HR 79 | Temp 97.6°F | Ht 63.0 in | Wt 150.0 lb

## 2017-08-14 DIAGNOSIS — T148XXA Other injury of unspecified body region, initial encounter: Secondary | ICD-10-CM

## 2017-08-14 DIAGNOSIS — L089 Local infection of the skin and subcutaneous tissue, unspecified: Secondary | ICD-10-CM | POA: Diagnosis not present

## 2017-08-14 DIAGNOSIS — S81811S Laceration without foreign body, right lower leg, sequela: Secondary | ICD-10-CM | POA: Diagnosis not present

## 2017-08-14 DIAGNOSIS — E034 Atrophy of thyroid (acquired): Secondary | ICD-10-CM | POA: Diagnosis not present

## 2017-08-14 DIAGNOSIS — Z4802 Encounter for removal of sutures: Secondary | ICD-10-CM

## 2017-08-14 DIAGNOSIS — M1991 Primary osteoarthritis, unspecified site: Secondary | ICD-10-CM | POA: Diagnosis not present

## 2017-08-14 DIAGNOSIS — Z79899 Other long term (current) drug therapy: Secondary | ICD-10-CM

## 2017-08-14 DIAGNOSIS — I480 Paroxysmal atrial fibrillation: Secondary | ICD-10-CM | POA: Diagnosis not present

## 2017-08-14 DIAGNOSIS — M48062 Spinal stenosis, lumbar region with neurogenic claudication: Secondary | ICD-10-CM | POA: Diagnosis not present

## 2017-08-14 DIAGNOSIS — R21 Rash and other nonspecific skin eruption: Secondary | ICD-10-CM

## 2017-08-14 LAB — TSH: TSH: 0.08 m[IU]/L — AB (ref 0.40–4.50)

## 2017-08-14 LAB — BASIC METABOLIC PANEL WITH GFR
BUN: 16 mg/dL (ref 7–25)
CALCIUM: 10.1 mg/dL (ref 8.6–10.4)
CO2: 33 mmol/L — AB (ref 20–32)
Chloride: 100 mmol/L (ref 98–110)
Creat: 0.82 mg/dL (ref 0.60–0.88)
GFR, Est African American: 73 mL/min/{1.73_m2} (ref >=60)
GFR, Est Non African American: 63 mL/min/{1.73_m2} (ref >=60)
GLUCOSE: 100 mg/dL (ref 65–139)
Potassium: 4.8 mmol/L (ref 3.5–5.3)
SODIUM: 139 mmol/L (ref 135–146)

## 2017-08-14 LAB — CBC WITH DIFFERENTIAL/PLATELET
BASOS PCT: 0.9 %
Basophils Absolute: 62 cells/uL (ref 0–200)
EOS ABS: 173 {cells}/uL (ref 15–500)
EOS PCT: 2.5 %
HCT: 39.8 % (ref 35.0–45.0)
Hemoglobin: 13.3 g/dL (ref 11.7–15.5)
Lymphs Abs: 1007 cells/uL (ref 850–3900)
MCH: 29.6 pg (ref 27.0–33.0)
MCHC: 33.4 g/dL (ref 32.0–36.0)
MCV: 88.6 fL (ref 80.0–100.0)
MONOS PCT: 12.2 %
MPV: 10.7 fL (ref 7.5–12.5)
Neutro Abs: 4816 cells/uL (ref 1500–7800)
Neutrophils Relative %: 69.8 %
PLATELETS: 337 10*3/uL (ref 140–400)
RBC: 4.49 10*6/uL (ref 3.80–5.10)
RDW: 12.6 % (ref 11.0–15.0)
TOTAL LYMPHOCYTE: 14.6 %
WBC mixed population: 842 cells/uL (ref 200–950)
WBC: 6.9 10*3/uL (ref 3.8–10.8)

## 2017-08-14 LAB — ALT: ALT: 9 U/L (ref 6–29)

## 2017-08-14 LAB — T4, FREE: FREE T4: 1.4 ng/dL (ref 0.8–1.8)

## 2017-08-14 MED ORDER — TRAMADOL HCL 50 MG PO TABS: 50.0000 mg | ORAL_TABLET | Freq: Three times a day (TID) | ORAL | 0 refills | Status: DC | PRN

## 2017-08-14 MED ORDER — DOXYCYCLINE HYCLATE 100 MG PO TABS: 100.0000 mg | ORAL_TABLET | Freq: Two times a day (BID) | ORAL | 0 refills | Status: DC

## 2017-08-14 NOTE — Patient Instructions (Addendum)
Wound care discussed - wash with warm soapy water, dry, apply topical antibiotic daily. Cover with dressing and change daily.  START DOXYCYCLINE 100MG  TAKE 1 TABLET 2 TIMES DAILY X 7 DAYS  TAKE PROBIOTIC  LIKE CULTURELLE OR FLORASTER DAILY WHILE ON ANTIBIOTIC  Continue other medications as ordered  follow up with specialists as scheduled  Follow up in 2 weeks for leg check or sooner if increased swelling, redness or discharge; fever or chills; worsening leg pain  Follow up in 3 mos for thyroid, arthritis, PAF, hx chf

## 2017-08-14 NOTE — Progress Notes (Signed)
Patient ID: Katrina Velez, female   DOB: 10-22-1925, 84 y.o.   MRN: [id number]   Location:  [facility] OFFICE  Provider: DR Arletha Grippe  Code Status:  Goals of Care:  Advanced Directives 08/04/2017  Does Patient Have a Medical Advance Directive? No  Does patient want to make changes to medical advance directive? -  Would patient like information on creating a medical advance directive? -  Pre-existing out of facility DNR order (yellow form or pink MOST form) -     Chief Complaint  Patient presents with  . Medical Management of Chronic Issues    3 month follow-up on thyroid, arthritis, and hx of CHF. Patient c/o raised areas on hands  . Suture / Staple Removal    Remove stitches from right lower leg  . Medication Refill    Refill Tramadol     HPI: Patient is a 84 y.o. female seen today for medical management of chronic diseases. She was seen in the ED on 08/04/17 for leg laceration sustained on a patio chair. She rec'd several sutures and was told to have them removed in 10-14 days. She has not applied topical abx as she was not told verbally to do so (written in ED AVS). They have noticed d/c in last 1-2 days. Changing bandage every day. No f/c. Increased redness at laceration area. She began to have throbbing pain in leg yesterday. She takes eliquis for afib.  She is c/a "bumps" on her right hand x unknown duration. No itching. No vesicular formation. No redness. No d/c. No pain.  Chronic back pain - stable overall. MRI L spine in 12/2016 revealed severe DDD with left L5-S1 foraminal L5 compression; moderate-severe stenosis at L3-L4. She continues to have numbness in LLE  Seasonal allergic rhinitis - stable on OTC anthistamine and flonase. (+) rhinorrhea and post nasal drip  PAF - stable. rate controlled on cardizem LA and metoprolol succ. She takes eliquis for anticoagulation. Followed by cardiology. She had a successful cardioversion in Arril 2017 but later reverted back to afib. She was  hospitalized in Jan 2017 for afib w RVR, acute CHF and acute pulmonary edema. 2D echo showed EF 50-55%. Her last ECG in Apr 2018 showed afib.  Hx CHF - takes lasix daily prn "feeling bloated". Followed by cardiology. No recent weight gain  COPD/hx tobacco abuse - stable off meds. She no longer smokes. No exacerbations  Arthritis - pain stable. Her pain is improved on tramadol prn and also uses topical OTC lidocaine on wrists for carpal tunnel discomfort. She has taken ASA in the past but was told not to combine it with eliquis. She is intolerant of tylenol.   Hypothyroidism - controlled. takes brand name only synthroid 120mg daily. TSH 0.12; free T4 1.6. She no longer sees Endo. She is taking med fasting and 1st thing in the AM.    Past Medical History:  Diagnosis Date  . [place]-fib ([place] 05/[place]   HOSPITALIZED   . Arthritis   . Cataract   . Hypothyroidism   . [place]   . Thyroid disease     Past Surgical History:  Procedure Laterality Date  . CARDIOVERSION N/A 06/08/2015   Procedure: CARDIOVERSION;  Surgeon: MJerline Pain MD;  [facility];  Service: Cardiovascular;  Laterality: N/A;  . CARDIOVERSION N/A 09/02/2015   Procedure: CARDIOVERSION;  Surgeon: SHerminio Commons MD;  [place]  Service: Cardiovascular;  Laterality: N/A;  . CATARACT EXTRACTION, BILATERAL Bilateral 2010  .  JOINT REPLACEMENT    . KNEE SURGERY  2002  . right hip replacement    . RIGHT KNEE REPLACEMENT  2011  . TEE WITHOUT CARDIOVERSION N/A 06/08/2015   Procedure: TRANSESOPHAGEAL ECHOCARDIOGRAM (TEE);  Surgeon: Jerline Pain, MD;  Location: [place];  Service: Cardiovascular;  Laterality: N/A;  . TOTAL HIP ARTHROPLASTY Right 2011  . TOTAL SHOULDER REPLACEMENT Right 2003  . TOTAL SHOULDER REPLACEMENT Left 2007  . WRIST SURGERY Bilateral 2008   both wrists     reports that she quit smoking about 6 years ago. Her smoking use included cigarettes. She has a  65.00 pack-year smoking history. She has never used smokeless tobacco. She reports that she drinks about 0.6 - 1.2 oz of alcohol per week. She reports that she does not use drugs. Social History   Socioeconomic History  . Marital status: Widowed    Spouse name: Not on file  . Number of children: Not on file  . Years of education: Not on file  . Highest education level: Not on file  Occupational History  . Not on file  Social Needs  . Financial resource strain: Not on file  . Food insecurity:    Worry: Not on file    Inability: Not on file  . Transportation needs:    Medical: Not on file    Non-medical: Not on file  Tobacco Use  . Smoking status: Former Smoker    Packs/day: 1.00    Years: 65.00    Pack years: 65.00    Types: Cigarettes    Last attempt to quit: 05/28/2011    Years since quitting: 6.2  . Smokeless tobacco: Never Used  Substance and Sexual Activity  . Alcohol use: Yes    Alcohol/week: 0.6 - 1.2 oz    Types: 1 - 2 Glasses of wine per week    Comment: occasional drink  . Drug use: No  . Sexual activity: Not on file  Lifestyle  . Physical activity:    Days per week: Not on file    Minutes per session: Not on file  . Stress: Not on file  Relationships  . Social connections:    Talks on phone: Not on file    Gets together: Not on file    Attends religious service: Not on file    Active member of club or organization: Not on file    Attends meetings of clubs or organizations: Not on file    Relationship status: Not on file  . Intimate partner violence:    Fear of current or ex partner: Not on file    Emotionally abused: Not on file    Physically abused: Not on file    Forced sexual activity: Not on file  Other Topics Concern  . Not on file  Social History Narrative   DIET: NONE      DO YOU DRINK/EAT THINGS WITH CAFFEINE: YES, COFFEE AND TEA       MARITAL STATUS: WIDOWED      WHAT YEAR WERE YOU MARRIED:1950      DO YOU LIVE IN A HOUSE, APARTMENT,  ASSISTED LIVING, CONDO TRAILER ETC.: HOUSE       IS IT ONE OR MORE STORIES: 2 STORIES      HOW MANY PERSONS LIVE IN YOUR HOME: 1      DO YOU HAVE PETS IN YOUR HOME: 1 CAT AND A PART TIME DOG       CURRENT OR PAST PROFESSION: CIVIL SERVANT  DO YOU EXERCISE: NO       WHAT TYPE AND HOW OFTEN:NO    Family History  Problem Relation Age of Onset  . Hypercalcemia Daughter   . Hypertension Daughter     Allergies  Allergen Reactions  . Banana Nausea Only    All banana products also  . Penicillins Hives    Outpatient Encounter Medications as of 08/14/2017  Medication Sig  . apixaban (ELIQUIS) 5 MG TABS tablet Take 1 tablet (5 mg total) by mouth 2 (two) times daily.  Marland Kitchen ascorbic acid (VITAMIN C) 500 MG tablet Take 1,000 mg by mouth daily.  . cholecalciferol (VITAMIN D) 1000 units tablet Take 1,000 Units by mouth daily.  Marland Kitchen diltiazem (CARDIZEM LA) 120 MG 24 hr tablet Take 1 tablet (120 mg total) by mouth 2 (two) times daily.  . furosemide (LASIX) 20 MG tablet TAKE 1 TABLET DAILY AS NEEDED FOR WEIGHT GAIN >3LBS.  Marland Kitchen gabapentin (NEURONTIN) 300 MG capsule Take 1 capsule (300 mg total) by mouth at bedtime.  . metoprolol succinate (TOPROL XL) 25 MG 24 hr tablet Take 1 tablet by mouth twice a day  . Multiple Vitamin (MULTIVITAMIN) capsule Take 1 capsule by mouth daily.  Marland Kitchen SYNTHROID 137 MCG tablet Take 1 tablet (137 mcg total) by mouth daily before breakfast.  . traMADol (ULTRAM) 50 MG tablet Take 1 tablet (50 mg total) by mouth every 8 (eight) hours as needed. for pain  . vitamin E 400 UNIT capsule Take 400 Units by mouth daily.   No facility-administered encounter medications on file as of 08/14/2017.     Review of Systems:  Review of Systems  Musculoskeletal: Positive for arthralgias and gait problem.  Skin: Positive for rash.  All other systems reviewed and are negative.   Health Maintenance  Topic Date Due  . TETANUS/TDAP  10/11/2026  . INFLUENZA VACCINE  Completed  . DEXA  SCAN  Completed  . PNA vac Low Risk Adult  Completed    Physical Exam: Vitals:   08/14/17 0855  BP: 118/64  Pulse: 79  Temp: 97.6 F (36.4 C)  TempSrc: Oral  SpO2: 95%  Weight: 150 lb (68 kg)  Height: 5' 3"  (1.6 m)   Body mass index is 26.57 kg/m. Physical Exam  Musculoskeletal: She exhibits edema.  Skin: Laceration and rash (right dorsal hand with skin tone raised nodular rash with no redness, vesicular formation or d/c) noted.       Labs reviewed: Basic Metabolic Panel: Recent Labs    10/04/16 1124 01/03/17 0955  NA 139 139  K 4.7 5.3  CL 102 101  CO2 30 22  GLUCOSE 89 106*  BUN 19 12  CREATININE 0.87 0.84  CALCIUM 10.0 10.2  TSH 0.09* 0.12*   Liver Function Tests: Recent Labs    10/04/16 1124 01/03/17 0955  AST 13 14  ALT 8 7  ALKPHOS 68 74  BILITOT 0.7 0.5  PROT 6.7 6.7  ALBUMIN 4.2 4.2   No results for input(s): LIPASE, AMYLASE in the last 8760 hours. No results for input(s): AMMONIA in the last 8760 hours. CBC: Recent Labs    10/04/16 1124  WBC 6.2  NEUTROABS 4,154  HGB 14.4  HCT 43.8  MCV 91.6  PLT 342   Lipid Panel: No results for input(s): CHOL, HDL, LDLCALC, TRIG, CHOLHDL, LDLDIRECT in the last 8760 hours. No results found for: HGBA1C  Procedures since last visit: No results found.  Assessment/Plan   ICD-10-CM   1. Infected laceration  T14.8XXA CBC with Differential/Platelets   L08.9   2. Laceration of right leg excluding thigh, sequela S81.811S   3. Primary osteoarthritis, unspecified site M19.91 traMADol (ULTRAM) 50 MG tablet  4. Spinal stenosis of lumbar region with neurogenic claudication M48.062 traMADol (ULTRAM) 50 MG tablet  5. PAF (paroxysmal atrial fibrillation) (HCC) I48.0   6. Hypothyroidism due to acquired atrophy of thyroid E03.4 TSH    T4, Free  7. High risk medication use Z79.899 BMP with eGFR([facility])    ALT    CBC with Differential/Platelets  8. Visit for suture removal Z48.02   9. Rash of hands -  nonspecific; observation warranted R21     After area prepped in draped in a sterile fashion, x9 sutures removed form right leg laceration without complication. Topical antibiotic applied and dressing applied.  Wound care discussed - wash with warm soapy water, dry, apply topical antibiotic daily. Cover with dressing and change daily.  START DOXYCYCLINE 100MG TAKE 1 TABLET 2 TIMES DAILY X 7 DAYS  TAKE PROBIOTIC  LIKE CULTURELLE OR FLORASTER DAILY WHILE ON ANTIBIOTIC  Continue other medications as ordered  follow up with specialists as scheduled  Follow up in 2 weeks for leg check or sooner if increased swelling, redness or discharge; fever or chills; worsening leg pain  Follow up in 3 mos for thyroid, arthritis, PAF, hx chf   Kenslie Abbruzzese S. Perlie Gold  [facility] [address] [place], [place] [address] [phone] Cell (Monday-Friday 8 AM - 5 PM) [phone] After 5 PM and follow prompts

## 2017-08-14 NOTE — Addendum Note (Signed)
Addended by: Maurice SmallBEATTY, Taylore Hinde C on: 08/14/2017 01:08 PM   Modules accepted: Orders

## 2017-08-14 NOTE — Telephone Encounter (Signed)
Patient's daughter called and left message with Bo MerinoGeri (medical records/registration) asking that she confirm with me and Dr.Carter that antibiotic was sent to pharmacy for they went to pharmacy and rx was not available.  RX was not sent, I made addendum to OV note from today and sent rx as listed on after visit summary.

## 2017-08-15 ENCOUNTER — Telehealth: Payer: Self-pay

## 2017-08-15 ENCOUNTER — Other Ambulatory Visit: Payer: Self-pay

## 2017-08-15 DIAGNOSIS — E034 Atrophy of thyroid (acquired): Secondary | ICD-10-CM

## 2017-08-15 MED ORDER — LEVOTHYROXINE SODIUM 125 MCG PO TABS: 125.0000 ug | ORAL_TABLET | Freq: Every day | ORAL | 0 refills | Status: DC

## 2017-08-15 NOTE — Telephone Encounter (Signed)
Patient requested that we only send #[phone] at this time due to cost of the medication.

## 2017-08-15 NOTE — Telephone Encounter (Signed)
-----   Message from ChapmanMonica Carter, [place]DO sent at 08/15/2017 10:35 AM EDT ----- Thyroid is overactive - REDUCE brand necessary synthroid 125mcg #30 take 1 tab po daily with 2RF; repeat TSH in 6 weeks for hypothyroidism; other labs stable; continue other medications as ordered and follow up as scheduled

## 2017-08-31 ENCOUNTER — Ambulatory Visit (INDEPENDENT_AMBULATORY_CARE_PROVIDER_SITE_OTHER): Payer: Medicare Other | Admitting: Internal Medicine

## 2017-08-31 ENCOUNTER — Encounter: Payer: Self-pay | Admitting: Internal Medicine

## 2017-08-31 VITALS — BP 126/80 | HR 84 | Temp 97.7°F | Ht 63.0 in | Wt 146.0 lb

## 2017-08-31 DIAGNOSIS — T148XXA Other injury of unspecified body region, initial encounter: Secondary | ICD-10-CM | POA: Diagnosis not present

## 2017-08-31 DIAGNOSIS — L089 Local infection of the skin and subcutaneous tissue, unspecified: Secondary | ICD-10-CM | POA: Diagnosis not present

## 2017-08-31 MED ORDER — ZOSTER VAC RECOMB ADJUVANTED 50 MCG/0.5ML IM SUSR: 0.5000 mL | Freq: Once | INTRAMUSCULAR | 1 refills | Status: AC

## 2017-08-31 MED FILL — SHINGRIX VIAL KIT: 50 | 1 days supply | Qty: 1 | Fill #0

## 2017-08-31 NOTE — Patient Instructions (Signed)
Continue to keep wound clean and allow healing  Keep leg elevated when seated to improve swelling in leg  follow up as scheduled for lab next month and in June for routine visit

## 2017-08-31 NOTE — Progress Notes (Signed)
Patient ID: Katrina Velez, female   DOB: 1926-02-19, 84 y.o.   MRN: [id number]   Location:  [facility] OFFICE  Provider: DR Elmon Kirschner Goals of Care:  Advanced Directives 08/31/2017  Does Patient Have a Medical Advance Directive? Yes  Type of Estate agent of [place];Living will  Does patient want to make changes to medical advance directive? No - Patient declined  Copy of Healthcare Power of Attorney in Chart? Yes  Would patient like information on creating a medical advance directive? -  Pre-existing out of facility DNR order (yellow form or pink MOST form) -     Chief Complaint  Patient presents with  . 2 Week Follow-up    2 week follow-up on leg. Per patient leg is healing   . Medication Refill    No refills needed   . Immunizations    RX request for shingrix printed   . Advance Care Planning    HCPOA and Living Will     HPI: Patient is a 84 y.o. female seen today for f/u infected laceration. She completed 7 days doxy. Right anterior leg still sore but she has a scab on wound. No d/c, f/c. She has RLE swelling. No other concerns.   Past Medical History:  Diagnosis Date  . A-fib (HCC) 05/2015   HOSPITALIZED   . Arthritis   . Cataract   . Hypothyroidism   . Permanent atrial fibrillation (HCC)   . Thyroid disease     Past Surgical History:  Procedure Laterality Date  . CARDIOVERSION N/A 06/08/2015   Procedure: CARDIOVERSION;  Surgeon: Jake Bathe, MD;  Location: [facility] ENDOSCOPY;  Service: Cardiovascular;  Laterality: N/A;  . CARDIOVERSION N/A 09/02/2015   Procedure: CARDIOVERSION;  Surgeon: Laqueta Linden, MD;  Location: [facility] ENDOSCOPY;  Service: Cardiovascular;  Laterality: N/A;  . CATARACT EXTRACTION, BILATERAL Bilateral 2010  . JOINT REPLACEMENT    . KNEE SURGERY  2002  . right hip replacement    . RIGHT KNEE REPLACEMENT  2011  . TEE WITHOUT CARDIOVERSION N/A 06/08/2015   Procedure: TRANSESOPHAGEAL ECHOCARDIOGRAM (TEE);  Surgeon: Jake Bathe, MD;  Location: [facility] ENDOSCOPY;  Service: Cardiovascular;  Laterality: N/A;  . TOTAL HIP ARTHROPLASTY Right 2011  . TOTAL SHOULDER REPLACEMENT Right 2003  . TOTAL SHOULDER REPLACEMENT Left 2007  . WRIST SURGERY Bilateral 2008   both wrists     reports that she quit smoking about 6 years ago. Her smoking use included cigarettes. She has a 65.00 pack-year smoking history. She has never used smokeless tobacco. She reports that she drinks about 0.6 - 1.2 oz of alcohol per week. She reports that she does not use drugs. Social History   Socioeconomic History  . Marital status: Widowed    Spouse name: Not on file  . Number of children: Not on file  . Years of education: Not on file  . Highest education level: Not on file  Occupational History  . Not on file  Social Needs  . Financial resource strain: Not on file  . Food insecurity:    Worry: Not on file    Inability: Not on file  . Transportation needs:    Medical: Not on file    Non-medical: Not on file  Tobacco Use  . Smoking status: Former Smoker    Packs/day: 1.00    Years: 65.00    Pack years: 65.00    Types: Cigarettes    Last attempt to quit: 05/28/2011    Years  since quitting: 6.2  . Smokeless tobacco: Never Used  Substance and Sexual Activity  . Alcohol use: Yes    Alcohol/week: 0.6 - 1.2 oz    Types: 1 - 2 Glasses of wine per week    Comment: occasional drink  . Drug use: No  . Sexual activity: Not on file  Lifestyle  . Physical activity:    Days per week: Not on file    Minutes per session: Not on file  . Stress: Not on file  Relationships  . Social connections:    Talks on phone: Not on file    Gets together: Not on file    Attends religious service: Not on file    Active member of club or organization: Not on file    Attends meetings of clubs or organizations: Not on file    Relationship status: Not on file  . Intimate partner violence:    Fear of current or ex partner: Not on file    Emotionally  abused: Not on file    Physically abused: Not on file    Forced sexual activity: Not on file  Other Topics Concern  . Not on file  Social History Narrative   DIET: NONE      DO YOU DRINK/EAT THINGS WITH CAFFEINE: YES, COFFEE AND TEA       MARITAL STATUS: WIDOWED      WHAT YEAR WERE YOU MARRIED:1950      DO YOU LIVE IN A HOUSE, APARTMENT, ASSISTED LIVING, CONDO TRAILER ETC.: HOUSE       IS IT ONE OR MORE STORIES: 2 STORIES      HOW MANY PERSONS LIVE IN YOUR HOME: 1      DO YOU HAVE PETS IN YOUR HOME: 1 CAT AND A PART TIME DOG       CURRENT OR PAST PROFESSION: CIVIL SERVANT      DO YOU EXERCISE: NO       WHAT TYPE AND HOW OFTEN:NO    Family History  Problem Relation Age of Onset  . Hypercalcemia Daughter   . Hypertension Daughter     Allergies  Allergen Reactions  . Banana Nausea Only    All banana products also  . Penicillins Hives    Outpatient Encounter Medications as of 08/31/2017  Medication Sig  . apixaban (ELIQUIS) 5 MG TABS tablet Take 1 tablet (5 mg total) by mouth 2 (two) times daily.  Marland Kitchen ascorbic acid (VITAMIN C) 500 MG tablet Take 1,000 mg by mouth daily.  . cholecalciferol (VITAMIN D) 1000 units tablet Take 1,000 Units by mouth daily.  Marland Kitchen diltiazem (CARDIZEM LA) 120 MG 24 hr tablet Take 1 tablet (120 mg total) by mouth 2 (two) times daily.  . furosemide (LASIX) 20 MG tablet TAKE 1 TABLET DAILY AS NEEDED FOR WEIGHT GAIN >3LBS.  Marland Kitchen gabapentin (NEURONTIN) 300 MG capsule Take 1 capsule (300 mg total) by mouth at bedtime.  Marland Kitchen levothyroxine (SYNTHROID) 125 MCG tablet Take 1 tablet (125 mcg total) by mouth daily before breakfast.  . metoprolol succinate (TOPROL XL) 25 MG 24 hr tablet Take 1 tablet by mouth twice a day  . Multiple Vitamin (MULTIVITAMIN) capsule Take 1 capsule by mouth daily.  . traMADol (ULTRAM) 50 MG tablet Take 1 tablet (50 mg total) by mouth every 8 (eight) hours as needed. for pain  . vitamin E 400 UNIT capsule Take 400 Units by mouth daily.    Marland Kitchen Zoster Vaccine Adjuvanted [facility]) injection Inject 0.5 mLs into  the muscle once for 1 dose.  . [DISCONTINUED] Zoster Vaccine Adjuvanted [facility](SHINGRIX) injection Inject 0.5 mLs into the muscle once.  . [DISCONTINUED] doxycycline (VIBRA-TABS) 100 MG tablet Take 1 tablet (100 mg total) by mouth 2 (two) times daily.   No facility-administered encounter medications on file as of 08/31/2017.     Review of Systems:  Review of Systems  Cardiovascular: Positive for leg swelling.  Musculoskeletal: Positive for joint swelling.  Skin: Positive for wound.  All other systems reviewed and are negative.   Health Maintenance  Topic Date Due  . INFLUENZA VACCINE  12/20/2017  . TETANUS/TDAP  10/11/2026  . DEXA SCAN  Completed  . PNA vac Low Risk Adult  Completed    Physical Exam: Vitals:   08/31/17 0857  BP: 126/80  Pulse: 84  Temp: 97.7 F (36.5 C)  TempSrc: Oral  SpO2: 95%  Weight: 146 lb (66.2 kg)  Height: 5\' 3"  (1.6 m)   Body mass index is 25.86 kg/m. Physical Exam  Constitutional: She is oriented to person, place, and time. She appears well-developed and well-nourished.  Cardiovascular: Intact distal pulses and normal pulses.  Trace RLE edema. No LLE edema. No calf TTP.   Musculoskeletal: She exhibits edema (right ankle with reduced ROM; right medial knee). She exhibits no tenderness.  Neurological: She is alert and oriented to person, place, and time.  Skin: Skin is warm and dry. No rash noted.  Right anterior leg eschar healing with min blanchable redness of surrounding skin. No d/c. Min TTP. No increased warmth to touch.  Psychiatric: She has a normal mood and affect. Her behavior is normal. Judgment and thought content normal.    Labs reviewed: Basic Metabolic Panel: Recent Labs    10/04/16 1124 01/03/17 0955 08/14/17 1042  NA 139 139 139  K 4.7 5.3 4.8  CL 102 101 100  CO2 30 22 33*  GLUCOSE 89 106* 100  BUN 19 12 16   CREATININE 0.87 0.84 0.82  CALCIUM 10.0 10.2  10.1  TSH 0.09* 0.12* 0.08*   Liver Function Tests: Recent Labs    10/04/16 1124 01/03/17 0955 08/14/17 1042  AST 13 14  --   ALT 8 7 9   ALKPHOS 68 74  --   BILITOT 0.7 0.5  --   PROT 6.7 6.7  --   ALBUMIN 4.2 4.2  --    No results for input(s): LIPASE, AMYLASE in the last 8760 hours. No results for input(s): AMMONIA in the last 8760 hours. CBC: Recent Labs    10/04/16 1124 08/14/17 1042  WBC 6.2 6.9  NEUTROABS 4,154 4,816  HGB 14.4 13.3  HCT 43.8 39.8  MCV 91.6 88.6  PLT 342 337   Lipid Panel: No results for input(s): CHOL, HDL, LDLCALC, TRIG, CHOLHDL, LDLDIRECT in the last 8760 hours. No results found for: HGBA1C  Procedures since last visit: No results found.  Assessment/Plan   ICD-10-CM   1. Infected laceration T14.8XXA    L08.9    resolved   Continue to keep wound clean and allow healing  Keep leg elevated when seated to improve swelling in leg  follow up as scheduled for lab next month and in June for routine visit    Donna Silverman S. Ancil Linseyarter, D. O., F. A. C. O. I.  [facility] [address] [place], [place] [address] [phone] Cell (Monday-Friday 8 AM - 5 PM) [phone] After 5 PM and follow prompts

## 2017-09-13 ENCOUNTER — Ambulatory Visit (HOSPITAL_COMMUNITY): Payer: Medicare Other | Admitting: Nurse Practitioner

## 2017-09-14 ENCOUNTER — Ambulatory Visit: Payer: Medicare Other | Admitting: Internal Medicine

## 2017-09-14 ENCOUNTER — Other Ambulatory Visit (HOSPITAL_COMMUNITY): Payer: Self-pay | Admitting: Nurse Practitioner

## 2017-09-14 DIAGNOSIS — I4821 Permanent atrial fibrillation: Secondary | ICD-10-CM

## 2017-09-20 ENCOUNTER — Encounter (HOSPITAL_COMMUNITY): Payer: Self-pay | Admitting: Nurse Practitioner

## 2017-09-20 ENCOUNTER — Ambulatory Visit (HOSPITAL_COMMUNITY)
Admission: RE | Admit: 2017-09-20 | Discharge: 2017-09-20 | Disposition: A | Discharge status: Home / Self Care | Payer: Medicare Other | Source: Ambulatory Visit | Attending: Nurse Practitioner | Admitting: Nurse Practitioner

## 2017-09-20 VITALS — BP 118/82 | HR 95 | Ht 63.0 in | Wt 144.0 lb

## 2017-09-20 DIAGNOSIS — I4821 Permanent atrial fibrillation: Secondary | ICD-10-CM

## 2017-09-20 DIAGNOSIS — Z79890 Hormone replacement therapy (postmenopausal): Secondary | ICD-10-CM | POA: Diagnosis not present

## 2017-09-20 DIAGNOSIS — Z87891 Personal history of nicotine dependence: Secondary | ICD-10-CM | POA: Diagnosis not present

## 2017-09-20 DIAGNOSIS — E039 Hypothyroidism, unspecified: Secondary | ICD-10-CM | POA: Diagnosis not present

## 2017-09-20 DIAGNOSIS — Z96611 Presence of right artificial shoulder joint: Secondary | ICD-10-CM | POA: Diagnosis not present

## 2017-09-20 DIAGNOSIS — Z96612 Presence of left artificial shoulder joint: Secondary | ICD-10-CM | POA: Diagnosis not present

## 2017-09-20 DIAGNOSIS — Z96651 Presence of right artificial knee joint: Secondary | ICD-10-CM | POA: Diagnosis not present

## 2017-09-20 DIAGNOSIS — Z96641 Presence of right artificial hip joint: Secondary | ICD-10-CM | POA: Diagnosis not present

## 2017-09-20 DIAGNOSIS — Z7901 Long term (current) use of anticoagulants: Secondary | ICD-10-CM | POA: Diagnosis not present

## 2017-09-20 DIAGNOSIS — I4891 Unspecified atrial fibrillation: Secondary | ICD-10-CM | POA: Diagnosis present

## 2017-09-20 DIAGNOSIS — Z880 Allergy status to penicillin: Secondary | ICD-10-CM | POA: Insufficient documentation

## 2017-09-20 DIAGNOSIS — Z79899 Other long term (current) drug therapy: Secondary | ICD-10-CM | POA: Insufficient documentation

## 2017-09-20 DIAGNOSIS — I482 Chronic atrial fibrillation: Secondary | ICD-10-CM | POA: Diagnosis not present

## 2017-09-20 NOTE — Progress Notes (Signed)
Patient ID: Cordell Coke, female   DOB: Feb 27, 1926, 84 y.o.   MRN: [id number]       Primary Care Physician: Kirt Boys, DO Referring Physician: Dr. Laqueta Jean Schier is a 84 y.o. female with a h/o afib that was loaded on amiodarone and successfully cardioverted 4/13, but with ERAF. She did not see that much difference in SR and had some thyroid concerns  already on replacement, so decision was made to stop amiodarone and aim for rate control,HR has been staying below 100 at home. Her energy is good for her age and she is back to hanging clothes outside. No issues with anticoagulant.   Returns to afib clinic 8/23, after she has noticed at home some fluctuation in heart rate. Her daughter on taking her heart rate and Bp one day, noticed that hr heart rate was 48 bpm, although the pt did not feel bad. She talked to the on call provider and was advised to skip the next rate control drug. A few days later, the pt had a nonsustained episode of rvr at 150 bpm. She did feel lightheaded with that episode. On call provider was contacted and she was told to how to take extra rate control if needed. However after a few more minutes, her HR slowed down.  EKG showed afib with controlled afib. She is also upset because she feels that she needs more thyroid replacement. She has also felt better on a higher dose than what the labs suggest that she takes. Her synthroid was reduced several months back and the pt is c/o thinning hair, thinning nails, more fatigue, depression. She is planning to change to a new MD in the first of October to see if she can get straightened out with her thyroid.   Returns 9/6, holter monitor placed on previous visit and reviewed. Shows average HR around 107 bpm, high rate 160 bpm, minimum 40 bpm at 4:30 am. Pt was not aware of fast or slow heart rate.She could benefit from more rate control. She is still able to do her house work with minimal symptoms and feels that she has good rate  control.   F/u in the afib clinic 10/25 and has good rate control. Currently is on metoprolol ER 25 mg bid and cardizem 120 mg bid. Tried higher doses of BB but could not tolerate. She is tolerating this combination of rate control with HR in the 80's.   F/u in afib clinic,4/25, she is feeling well. Does not feel afib . No issues with shortness of breath or pedal edema. No bleeding issues with eliquis.  F/u in fib clinic, 09/20/17. She has now turned 89 and is doing well. She did lacerate her leg on a patio chair about 6 weeks  Ago and did need stitches. It is healing but a large dark scab is still present. Otherwise, no complaints.  Today, she denies symptoms of palpitations, chest pain, shortness of breath, orthopnea, PND, lower extremity edema, dizziness, presyncope, syncope, or neurologic sequela. The patient is tolerating medications without difficulties and is otherwise without complaint today.   Past Medical History:  Diagnosis Date  . A-fib (HCC) 05/2015   HOSPITALIZED   . Arthritis   . Cataract   . Hypothyroidism   . Permanent atrial fibrillation (HCC)   . Thyroid disease    Past Surgical History:  Procedure Laterality Date  . CARDIOVERSION N/A 06/08/2015   Procedure: CARDIOVERSION;  Surgeon: Jake Bathe, MD;  Location: [facility] ENDOSCOPY;  Service:  Cardiovascular;  Laterality: N/A;  . CARDIOVERSION N/A 09/02/2015   Procedure: CARDIOVERSION;  Surgeon: Laqueta Linden, MD;  Location: [facility] ENDOSCOPY;  Service: Cardiovascular;  Laterality: N/A;  . CATARACT EXTRACTION, BILATERAL Bilateral 2010  . JOINT REPLACEMENT    . KNEE SURGERY  2002  . right hip replacement    . RIGHT KNEE REPLACEMENT  2011  . TEE WITHOUT CARDIOVERSION N/A 06/08/2015   Procedure: TRANSESOPHAGEAL ECHOCARDIOGRAM (TEE);  Surgeon: Jake Bathe, MD;  Location: [facility] ENDOSCOPY;  Service: Cardiovascular;  Laterality: N/A;  . TOTAL HIP ARTHROPLASTY Right 2011  . TOTAL SHOULDER REPLACEMENT Right 2003  . TOTAL SHOULDER  REPLACEMENT Left 2007  . WRIST SURGERY Bilateral 2008   both wrists    Current Outpatient Medications  Medication Sig Dispense Refill  . apixaban (ELIQUIS) 5 MG TABS tablet Take 1 tablet (5 mg total) by mouth 2 (two) times daily. 180 tablet 3  . ascorbic acid (VITAMIN C) 500 MG tablet Take 1,000 mg by mouth daily.    . cholecalciferol (VITAMIN D) 1000 units tablet Take 1,000 Units by mouth daily.    Marland Kitchen diltiazem (CARDIZEM LA) 120 MG 24 hr tablet Take 1 tablet (120 mg total) by mouth 2 (two) times daily. 180 tablet 3  . furosemide (LASIX) 20 MG tablet TAKE 1 TABLET DAILY AS NEEDED FOR WEIGHT GAIN >3LBS. 30 tablet 0  . gabapentin (NEURONTIN) 300 MG capsule Take 1 capsule (300 mg total) by mouth at bedtime. 90 capsule 3  . levothyroxine (SYNTHROID) 125 MCG tablet Take 1 tablet (125 mcg total) by mouth daily before breakfast. 45 tablet 0  . metoprolol succinate (TOPROL-XL) 25 MG 24 hr tablet TAKE 1 TABLET BY MOUTH TWICE A DAY 180 tablet 0  . Multiple Vitamin (MULTIVITAMIN) capsule Take 1 capsule by mouth daily.    . traMADol (ULTRAM) 50 MG tablet Take 1 tablet (50 mg total) by mouth every 8 (eight) hours as needed. for pain 60 tablet 0  . vitamin E 400 UNIT capsule Take 400 Units by mouth daily.     No current facility-administered medications for this encounter.     Allergies  Allergen Reactions  . Banana Nausea Only    All banana products also  . Penicillins Hives    Social History   Socioeconomic History  . Marital status: Widowed    Spouse name: Not on file  . Number of children: Not on file  . Years of education: Not on file  . Highest education level: Not on file  Occupational History  . Not on file  Social Needs  . Financial resource strain: Not on file  . Food insecurity:    Worry: Not on file    Inability: Not on file  . Transportation needs:    Medical: Not on file    Non-medical: Not on file  Tobacco Use  . Smoking status: Former Smoker    Packs/day: 1.00     Years: 65.00    Pack years: 65.00    Types: Cigarettes    Last attempt to quit: 05/28/2011    Years since quitting: 6.3  . Smokeless tobacco: Never Used  Substance and Sexual Activity  . Alcohol use: Yes    Alcohol/week: 0.6 - 1.2 oz    Types: 1 - 2 Glasses of wine per week    Comment: occasional drink  . Drug use: No  . Sexual activity: Not on file  Lifestyle  . Physical activity:    Days per week: Not on  file    Minutes per session: Not on file  . Stress: Not on file  Relationships  . Social connections:    Talks on phone: Not on file    Gets together: Not on file    Attends religious service: Not on file    Active member of club or organization: Not on file    Attends meetings of clubs or organizations: Not on file    Relationship status: Not on file  . Intimate partner violence:    Fear of current or ex partner: Not on file    Emotionally abused: Not on file    Physically abused: Not on file    Forced sexual activity: Not on file  Other Topics Concern  . Not on file  Social History Narrative   DIET: NONE      DO YOU DRINK/EAT THINGS WITH CAFFEINE: YES, COFFEE AND TEA       MARITAL STATUS: WIDOWED      WHAT YEAR WERE YOU MARRIED:1950      DO YOU LIVE IN A HOUSE, APARTMENT, ASSISTED LIVING, CONDO TRAILER ETC.: HOUSE       IS IT ONE OR MORE STORIES: 2 STORIES      HOW MANY PERSONS LIVE IN YOUR HOME: 1      DO YOU HAVE PETS IN YOUR HOME: 1 CAT AND A PART TIME DOG       CURRENT OR PAST PROFESSION: CIVIL SERVANT      DO YOU EXERCISE: NO       WHAT TYPE AND HOW OFTEN:NO    Family History  Problem Relation Age of Onset  . Hypercalcemia Daughter   . Hypertension Daughter     ROS- All systems are reviewed and negative except as per the HPI above  Physical Exam: Vitals:   09/20/17 1015  BP: 118/82  Pulse: 95  Weight: 144 lb (65.3 kg)  Height:  (1.6 m)    GEN- The patient is well appearing, alert and oriented x 3 today.   Head- normocephalic,  atraumatic Eyes-  Sclera clear, conjunctiva pink Ears- hearing intact Oropharynx- clear Neck- supple, no JVP Lymph- no cervical lymphadenopathy Lungs- Clear to ausculation bilaterally, normal work of breathing Heart- Irregular rate and rhythm, no murmurs, rubs or gallops, PMI not laterally displaced GI- soft, NT, ND, + BS Extremities- no clubbing, cyanosis, or edema MS- no significant deformity or atrophy Skin- no rash or lesion Psych- euthymic mood, full affect Neuro- strength and sensation are intact  EKG-afib at 92 bpm, qrs int 84 ms, qtc 445 ms Epic records reviewed Holter monitor reviewed  Assessment and Plan: 1.Long standing permanent afib Rate controlled Appears to be doing well Continue metoprolol ER 25 mg bid and cardizem 120 mg bid, appears to have HR well controlled with this dose Continue eliquis 5 mg bid, Creatinine last checked 10/6 and stable at 0.82 (march 2019) , weight  over 60 kg, no bleeding issues  2. Hypothyroidism  Per pcp  F/u 6 months  Elvina Sidle. [facility] [address] [place], [place] [address] [phone]

## 2017-09-21 ENCOUNTER — Other Ambulatory Visit (HOSPITAL_COMMUNITY): Payer: Self-pay | Admitting: Nurse Practitioner

## 2017-10-01 ENCOUNTER — Ambulatory Visit: Payer: No Typology Code available for payment source

## 2017-10-01 ENCOUNTER — Ambulatory Visit (INDEPENDENT_AMBULATORY_CARE_PROVIDER_SITE_OTHER): Payer: Medicare Other

## 2017-10-01 VITALS — BP 128/66 | HR 92 | Temp 98.0°F | Ht 63.0 in | Wt 145.0 lb

## 2017-10-01 DIAGNOSIS — Z0000 Encounter for general adult medical examination without abnormal findings: Secondary | ICD-10-CM

## 2017-10-01 DIAGNOSIS — E034 Atrophy of thyroid (acquired): Secondary | ICD-10-CM

## 2017-10-01 LAB — TSH: TSH: 0.22 m[IU]/L — AB (ref 0.40–4.50)

## 2017-10-01 NOTE — Patient Instructions (Signed)
[name] , Thank you for taking time to come for your Medicare Wellness Visit. I appreciate your ongoing commitment to your health goals. Please review the following plan we discussed and let me know if I can assist you in the future.   Screening recommendations/referrals: Colonoscopy excluded, over age 82 Mammogram excluded, over age 52 Bone Density up to date Recommended yearly ophthalmology/optometry visit for glaucoma screening and checkup Recommended yearly dental visit for hygiene and checkup  Vaccinations: Influenza vaccine up to date, due 2019 fall season Pneumococcal vaccine up to date, completed Tdap vaccine up to date, due 10/11/2026 Shingles vaccine up to date, second shot due between  10/31/2017 and 03/02/2018  Advanced directives: In chart  Conditions/risks identified: none  Next appointment: [name], RN 10/04/2018 @ 10:45am   Preventive Care 65 Years and Older, Female Preventive care refers to lifestyle choices and visits with your health care provider that can promote health and wellness. What does preventive care include?  A yearly physical exam. This is also called an annual well check.  Dental exams once or twice a year.  Routine eye exams. Ask your health care provider how often you should have your eyes checked.  Personal lifestyle choices, including:  Daily care of your teeth and gums.  Regular physical activity.  Eating a healthy diet.  Avoiding tobacco and drug use.  Limiting alcohol use.  Practicing safe sex.  Taking low-dose aspirin every day.  Taking vitamin and mineral supplements as recommended by your health care provider. What happens during an annual well check? The services and screenings done by your health care provider during your annual well check will depend on your age, overall health, lifestyle risk factors, and family history of disease. Counseling  Your health care provider may ask you questions about your:  Alcohol  use.  Tobacco use.  Drug use.  Emotional well-being.  Home and relationship well-being.  Sexual activity.  Eating habits.  History of falls.  Memory and ability to understand (cognition).  Work and work Astronomer.  Reproductive health. Screening  You may have the following tests or measurements:  Height, weight, and BMI.  Blood pressure.  Lipid and cholesterol levels. These may be checked every 5 years, or more frequently if you are over 52 years old.  Skin check.  Lung cancer screening. You may have this screening every year starting at age 17 if you have a 30-pack-year history of smoking and currently smoke or have quit within the past 15 years.  Fecal occult blood test (FOBT) of the stool. You may have this test every year starting at age 11.  Flexible sigmoidoscopy or colonoscopy. You may have a sigmoidoscopy every 5 years or a colonoscopy every 10 years starting at age 64.  Hepatitis C blood test.  Hepatitis B blood test.  Sexually transmitted disease (STD) testing.  Diabetes screening. This is done by checking your blood sugar (glucose) after you have not eaten for a while (fasting). You may have this done every 1-3 years.  Bone density scan. This is done to screen for osteoporosis. You may have this done starting at age 57.  Mammogram. This may be done every 1-2 years. Talk to your health care provider about how often you should have regular mammograms. Talk with your health care provider about your test results, treatment options, and if necessary, the need for more tests. Vaccines  Your health care provider may recommend certain vaccines, such as:  Influenza vaccine. This is recommended every year.  Tetanus, diphtheria, and acellular pertussis (Tdap, Td) vaccine. You may need a Td booster every 10 years.  Zoster vaccine. You may need this after age 11.  Pneumococcal 13-valent conjugate (PCV13) vaccine. One dose is recommended after age  6.  Pneumococcal polysaccharide (PPSV23) vaccine. One dose is recommended after age 69. Talk to your health care provider about which screenings and vaccines you need and how often you need them. This information is not intended to replace advice given to you by your health care provider. Make sure you discuss any questions you have with your health care provider. Document Released: 06/04/2015 Document Revised: 01/26/2016 Document Reviewed: 03/09/2015 Elsevier Interactive Patient Education  2017 [place] Prevention in the Home Falls can cause injuries. They can happen to people of all ages. There are many things you can do to make your home safe and to help prevent falls. What can I do on the outside of my home?  Regularly fix the edges of walkways and driveways and fix any cracks.  Remove anything that might make you trip as you walk through a door, such as a raised step or threshold.  Trim any bushes or trees on the path to your home.  Use bright outdoor lighting.  Clear any walking paths of anything that might make someone trip, such as rocks or tools.  Regularly check to see if handrails are loose or broken. Make sure that both sides of any steps have handrails.  Any raised decks and porches should have guardrails on the edges.  Have any leaves, snow, or ice cleared regularly.  Use sand or salt on walking paths during winter.  Clean up any spills in your garage right away. This includes oil or grease spills. What can I do in the bathroom?  Use night lights.  Install grab bars by the toilet and in the tub and shower. Do not use towel bars as grab bars.  Use non-skid mats or decals in the tub or shower.  If you need to sit down in the shower, use a plastic, non-slip stool.  Keep the floor dry. Clean up any water that spills on the floor as soon as it happens.  Remove soap buildup in the tub or shower regularly.  Attach bath mats securely with double-sided  non-slip rug tape.  Do not have throw rugs and other things on the floor that can make you trip. What can I do in the bedroom?  Use night lights.  Make sure that you have a light by your bed that is easy to reach.  Do not use any sheets or blankets that are too big for your bed. They should not hang down onto the floor.  Have a firm chair that has side arms. You can use this for support while you get dressed.  Do not have throw rugs and other things on the floor that can make you trip. What can I do in the kitchen?  Clean up any spills right away.  Avoid walking on wet floors.  Keep items that you use a lot in easy-to-reach places.  If you need to reach something above you, use a strong step stool that has a grab bar.  Keep electrical cords out of the way.  Do not use floor polish or wax that makes floors slippery. If you must use wax, use non-skid floor wax.  Do not have throw rugs and other things on the floor that can make you trip. What can I do  with my stairs?  Do not leave any items on the stairs.  Make sure that there are handrails on both sides of the stairs and use them. Fix handrails that are broken or loose. Make sure that handrails are as long as the stairways.  Check any carpeting to make sure that it is firmly attached to the stairs. Fix any carpet that is loose or worn.  Avoid having throw rugs at the top or bottom of the stairs. If you do have throw rugs, attach them to the floor with carpet tape.  Make sure that you have a light switch at the top of the stairs and the bottom of the stairs. If you do not have them, ask someone to add them for you. What else can I do to help prevent falls?  Wear shoes that:  Do not have high heels.  Have rubber bottoms.  Are comfortable and fit you well.  Are closed at the toe. Do not wear sandals.  If you use a stepladder:  Make sure that it is fully opened. Do not climb a closed stepladder.  Make sure that both  sides of the stepladder are locked into place.  Ask someone to hold it for you, if possible.  Clearly mark and make sure that you can see:  Any grab bars or handrails.  First and last steps.  Where the edge of each step is.  Use tools that help you move around (mobility aids) if they are needed. These include:  Canes.  Walkers.  Scooters.  Crutches.  Turn on the lights when you go into a dark area. Replace any light bulbs as soon as they burn out.  Set up your furniture so you have a clear path. Avoid moving your furniture around.  If any of your floors are uneven, fix them.  If there are any pets around you, be aware of where they are.  Review your medicines with your doctor. Some medicines can make you feel dizzy. This can increase your chance of falling. Ask your doctor what other things that you can do to help prevent falls. This information is not intended to replace advice given to you by your health care provider. Make sure you discuss any questions you have with your health care provider. Document Released: 03/04/2009 Document Revised: 10/14/2015 Document Reviewed: 06/12/2014 Elsevier Interactive Patient Education  2017 [employer].

## 2017-10-01 NOTE — Progress Notes (Signed)
Subjective:   Katrina Velez is a 84 y.o. female who presents for Medicare Annual (Subsequent) preventive examination.  Last AWV-09/29/2016    Objective:     Vitals: BP 128/66 (BP Location: Left Arm, Patient Position: Sitting)   Pulse 92   Temp 98 F (36.7 C) (Oral)   Ht  (1.6 m)   Wt 145 lb (65.8 kg)   SpO2 96%   BMI 25.69 kg/m   Body mass index is 25.69 kg/m.  Advanced Directives 10/01/2017 08/31/2017 08/04/2017 05/08/2017 02/07/2017 10/04/2016 02/25/2016  Does Patient Have a Medical Advance Directive? Yes Yes No No No No No  Type of Estate agent of [place];Living will Healthcare Power of [place];Living will - - - - -  Does patient want to make changes to medical advance directive? No - Patient declined No - Patient declined - - Yes (ED - Information included in AVS) - -  Copy of Healthcare Power of Attorney in Chart? Yes Yes - - - - -  Would patient like information on creating a medical advance directive? - - - Yes (ED - Information included in AVS) - No - Patient declined No - patient declined information  Pre-existing out of facility DNR order (yellow form or pink MOST form) - - - - - - -    Tobacco Social History   Tobacco Use  Smoking Status Former Smoker  . Packs/day: 1.00  . Years: 65.00  . Pack years: 65.00  . Types: Cigarettes  . Last attempt to quit: 05/28/2011  . Years since quitting: 6.3  Smokeless Tobacco Never Used     Counseling given: Not Answered   Clinical Intake:  Pre-visit preparation completed: No  Pain : No/denies pain     Nutritional Risks: None Diabetes: No  How often do you need to have someone help you when you read instructions, pamphlets, or other written materials from your doctor or pharmacy?: 1 - Never What is the last grade level you completed in school?: [employer]  Interpreter Needed?: No  Information entered by :: [name], rN  Past Medical History:  Diagnosis Date  . A-fib (HCC) 05/2015     HOSPITALIZED   . Arthritis   . Cataract   . Hypothyroidism   . Permanent atrial fibrillation (HCC)   . Thyroid disease    Past Surgical History:  Procedure Laterality Date  . CARDIOVERSION N/A 06/08/2015   Procedure: CARDIOVERSION;  Surgeon: Jake Bathe, MD;  Location: [facility] ENDOSCOPY;  Service: Cardiovascular;  Laterality: N/A;  . CARDIOVERSION N/A 09/02/2015   Procedure: CARDIOVERSION;  Surgeon: Laqueta Linden, MD;  Location: [facility] ENDOSCOPY;  Service: Cardiovascular;  Laterality: N/A;  . CATARACT EXTRACTION, BILATERAL Bilateral 2010  . JOINT REPLACEMENT    . KNEE SURGERY  2002  . right hip replacement    . RIGHT KNEE REPLACEMENT  2011  . TEE WITHOUT CARDIOVERSION N/A 06/08/2015   Procedure: TRANSESOPHAGEAL ECHOCARDIOGRAM (TEE);  Surgeon: Jake Bathe, MD;  Location: [facility] ENDOSCOPY;  Service: Cardiovascular;  Laterality: N/A;  . TOTAL HIP ARTHROPLASTY Right 2011  . TOTAL SHOULDER REPLACEMENT Right 2003  . TOTAL SHOULDER REPLACEMENT Left 2007  . WRIST SURGERY Bilateral 2008   both wrists   Family History  Problem Relation Age of Onset  . Hypercalcemia Daughter   . Hypertension Daughter    Social History   Socioeconomic History  . Marital status: Widowed    Spouse name: Not on file  . Number of children: Not on file  .  Years of education: Not on file  . Highest education level: Not on file  Occupational History  . Not on file  Social Needs  . Financial resource strain: Not hard at all  . Food insecurity:    Worry: Never true    Inability: Never true  . Transportation needs:    Medical: No    Non-medical: No  Tobacco Use  . Smoking status: Former Smoker    Packs/day: 1.00    Years: 65.00    Pack years: 65.00    Types: Cigarettes    Last attempt to quit: 05/28/2011    Years since quitting: 6.3  . Smokeless tobacco: Never Used  Substance and Sexual Activity  . Alcohol use: Yes    Alcohol/week: 0.6 - 1.2 oz    Types: 1 - 2 Glasses of wine per week    Comment:  occasional drink  . Drug use: No  . Sexual activity: Not on file  Lifestyle  . Physical activity:    Days per week: 0 days    Minutes per session: 0 min  . Stress: Only a little  Relationships  . Social connections:    Talks on phone: More than three times a week    Gets together: More than three times a week    Attends religious service: Never    Active member of club or organization: No    Attends meetings of clubs or organizations: Never    Relationship status: Widowed  Other Topics Concern  . Not on file  Social History Narrative   DIET: NONE      DO YOU DRINK/EAT THINGS WITH CAFFEINE: YES, COFFEE AND TEA       MARITAL STATUS: WIDOWED      WHAT YEAR WERE YOU MARRIED:1950      DO YOU LIVE IN A HOUSE, APARTMENT, ASSISTED LIVING, CONDO TRAILER ETC.: HOUSE       IS IT ONE OR MORE STORIES: 2 STORIES      HOW MANY PERSONS LIVE IN YOUR HOME: 1      DO YOU HAVE PETS IN YOUR HOME: 1 CAT AND A PART TIME DOG       CURRENT OR PAST PROFESSION: CIVIL SERVANT      DO YOU EXERCISE: NO       WHAT TYPE AND HOW OFTEN:NO    Outpatient Encounter Medications as of 10/01/2017  Medication Sig  . ascorbic acid (VITAMIN C) 500 MG tablet Take 1,000 mg by mouth daily.  . cholecalciferol (VITAMIN D) 1000 units tablet Take 1,000 Units by mouth daily.  Marland Kitchen diltiazem (CARDIZEM LA) 120 MG 24 hr tablet Take 1 tablet (120 mg total) by mouth 2 (two) times daily.  Marland Kitchen ELIQUIS 5 MG TABS tablet TAKE 1 TABLET (5 MG TOTAL) BY MOUTH 2 (TWO) TIMES DAILY.  . furosemide (LASIX) 20 MG tablet TAKE 1 TABLET DAILY AS NEEDED FOR WEIGHT GAIN >3LBS.  Marland Kitchen gabapentin (NEURONTIN) 300 MG capsule Take 1 capsule (300 mg total) by mouth at bedtime.  Marland Kitchen levothyroxine (SYNTHROID) 125 MCG tablet Take 1 tablet (125 mcg total) by mouth daily before breakfast.  . metoprolol succinate (TOPROL-XL) 25 MG 24 hr tablet TAKE 1 TABLET BY MOUTH TWICE A DAY  . Multiple Vitamin (MULTIVITAMIN) capsule Take 1 capsule by mouth daily.  .  traMADol (ULTRAM) 50 MG tablet Take 1 tablet (50 mg total) by mouth every 8 (eight) hours as needed. for pain  . vitamin E 400 UNIT capsule Take 400 Units by  mouth daily.   No facility-administered encounter medications on file as of 10/01/2017.     Activities of Daily Living In your present state of health, do you have any difficulty performing the following activities: 10/01/2017  Hearing? Y  Vision? N  Difficulty concentrating or making decisions? Y  Walking or climbing stairs? Y  Dressing or bathing? N  Doing errands, shopping? N  Preparing Food and eating ? N  Using the Toilet? N  In the past six months, have you accidently leaked urine? Y  Do you have problems with loss of bowel control? N  Managing your Medications? N  Managing your Finances? N  Housekeeping or managing your Housekeeping? N  Some recent data might be hidden    Patient Care Team: Kirt Boys, DO as PCP - General (Internal Medicine) Sallye Lat, MD as Consulting Physician (Ophthalmology) Newman Nip, NP as Nurse Practitioner (Cardiology)    Assessment:   This is a routine wellness examination for Taci.  Exercise Activities and Dietary recommendations Current Exercise Habits: The patient does not participate in regular exercise at present, Exercise limited by: orthopedic condition(s)  Goals    None      Fall Risk Fall Risk  10/01/2017 08/07/2017 05/08/2017 10/04/2016 09/29/2016  Falls in the past year? No No No No No  Number falls in past yr: - - - - -  Injury with Fall? - - - - -   Is the patient's home free of loose throw rugs in walkways, pet beds, electrical cords, etc?   yes      Grab bars in the bathroom? yes      Handrails on the stairs?   yes      Adequate lighting?   yes  Depression Screen PHQ 2/9 Scores 10/01/2017 05/08/2017 10/04/2016 09/29/2016  PHQ - 2 Score 1 0 2 2  PHQ- 9 Score - - - 6     Cognitive Function MMSE - Mini Mental State Exam 10/01/2017 09/29/2016    Orientation to time 5 5  Orientation to Place 5 5  Registration 3 3  Attention/ Calculation 5 5  Recall 2 2  Language- name 2 objects 2 2  Language- repeat 1 1  Language- follow 3 step command 3 3  Language- read & follow direction 1 1  Write a sentence 1 1  Copy design 1 1  Total score 29 29        Immunization History  Administered Date(s) Administered  . Influenza Split 03/25/2012  . Influenza,inj,Quad PF,6+ Mos 02/24/2013, 03/15/2015, 02/25/2016  . Influenza-Unspecified 05/01/2014, 03/19/2017  . Pneumococcal Conjugate-13 05/27/2014  . Pneumococcal Polysaccharide-23 02/25/2016  . Tdap 10/10/2016  . Zoster 05/22/2001  . Zoster Recombinat (Shingrix) 08/31/2017    Qualifies for Shingles Vaccine? Up to date, due in June 2019  Screening Tests Health Maintenance  Topic Date Due  . INFLUENZA VACCINE  12/20/2017  . TETANUS/TDAP  10/11/2026  . DEXA SCAN  Completed  . PNA vac Low Risk Adult  Completed    Cancer Screenings: Lung: Low Dose CT Chest recommended if Age 84-80 years, 30 pack-year currently smoking OR have quit w/in 15years. Patient does qualify. Breast:  Up to date on Mammogram? Yes   Up to date of Bone Density/Dexa? Yes Colorectal: up to date  Additional Screenings: Hepatitis C Screening: declined     Plan:    I have personally reviewed and addressed the Medicare Annual Wellness questionnaire and have noted the following in the patient's chart:  A.  Medical and social history B. Use of alcohol, tobacco or illicit drugs  C. Current medications and supplements D. Functional ability and status E.  Nutritional status F.  Physical activity G. Advance directives H. List of other physicians I.  Hospitalizations, surgeries, and ER visits in previous 12 months J.  Vitals K. Screenings to include hearing, vision, cognitive, depression L. Referrals and appointments - none  In addition, I have reviewed and discussed with patient certain preventive protocols,  quality metrics, and best practice recommendations. A written personalized care plan for preventive services as well as general preventive health recommendations were provided to patient.  See attached scanned questionnaire for additional information.   Signed,   [name], RN Nurse Health Advisor  Patient Concerns: None

## 2017-10-02 ENCOUNTER — Other Ambulatory Visit: Payer: Self-pay

## 2017-10-02 DIAGNOSIS — E034 Atrophy of thyroid (acquired): Secondary | ICD-10-CM

## 2017-10-03 ENCOUNTER — Other Ambulatory Visit: Payer: Self-pay | Admitting: Internal Medicine

## 2017-10-30 ENCOUNTER — Other Ambulatory Visit: Payer: Self-pay | Admitting: Internal Medicine

## 2017-10-30 DIAGNOSIS — R200 Anesthesia of skin: Secondary | ICD-10-CM

## 2017-10-30 DIAGNOSIS — R202 Paresthesia of skin: Principal | ICD-10-CM

## 2017-11-01 MED FILL — SHINGRIX VIAL KIT: 50 | 1 days supply | Qty: 1 | Fill #1

## 2017-11-09 ENCOUNTER — Other Ambulatory Visit (HOSPITAL_COMMUNITY): Payer: Self-pay | Admitting: Nurse Practitioner

## 2017-11-09 DIAGNOSIS — I4821 Permanent atrial fibrillation: Secondary | ICD-10-CM

## 2017-11-12 ENCOUNTER — Other Ambulatory Visit: Payer: Medicare Other

## 2017-11-12 DIAGNOSIS — E034 Atrophy of thyroid (acquired): Secondary | ICD-10-CM

## 2017-11-13 LAB — TSH: TSH: 0.24 m[IU]/L — AB (ref 0.40–4.50)

## 2017-11-14 ENCOUNTER — Encounter: Payer: Self-pay | Admitting: Internal Medicine

## 2017-11-14 ENCOUNTER — Ambulatory Visit (INDEPENDENT_AMBULATORY_CARE_PROVIDER_SITE_OTHER): Payer: Medicare Other | Admitting: Internal Medicine

## 2017-11-14 VITALS — BP 118/76 | HR 85 | Temp 97.8°F | Ht 63.0 in | Wt 146.0 lb

## 2017-11-14 DIAGNOSIS — J301 Allergic rhinitis due to pollen: Secondary | ICD-10-CM

## 2017-11-14 DIAGNOSIS — Z79899 Other long term (current) drug therapy: Secondary | ICD-10-CM | POA: Diagnosis not present

## 2017-11-14 DIAGNOSIS — E034 Atrophy of thyroid (acquired): Secondary | ICD-10-CM | POA: Diagnosis not present

## 2017-11-14 DIAGNOSIS — I480 Paroxysmal atrial fibrillation: Secondary | ICD-10-CM | POA: Diagnosis not present

## 2017-11-14 DIAGNOSIS — M48062 Spinal stenosis, lumbar region with neurogenic claudication: Secondary | ICD-10-CM | POA: Diagnosis not present

## 2017-11-14 DIAGNOSIS — S81811S Laceration without foreign body, right lower leg, sequela: Secondary | ICD-10-CM | POA: Diagnosis not present

## 2017-11-14 LAB — BASIC METABOLIC PANEL WITH GFR
BUN: 15 mg/dL (ref 7–25)
CHLORIDE: 102 mmol/L (ref 98–110)
CO2: 31 mmol/L (ref 20–32)
Calcium: 10.1 mg/dL (ref 8.6–10.4)
Creat: 0.84 mg/dL (ref 0.60–0.88)
GFR, Est African American: 71 mL/min/{1.73_m2} (ref >=60)
GFR, Est Non African American: 61 mL/min/{1.73_m2} (ref >=60)
GLUCOSE: 92 mg/dL (ref 65–139)
Potassium: 4.8 mmol/L (ref 3.5–5.3)
SODIUM: 140 mmol/L (ref 135–146)

## 2017-11-14 MED ORDER — MONTELUKAST SODIUM 10 MG PO TABS: 10.0000 mg | ORAL_TABLET | Freq: Every day | ORAL | 3 refills | Status: DC

## 2017-11-14 NOTE — Progress Notes (Signed)
Patient ID: Katrina Velez, female   DOB: [birth year], 84 y.o.   MRN: [id number]   Location:  [facility] OFFICE  Provider: DR Arletha Grippe  Code Status:  Goals of Care:  Advanced Directives 11/14/2017  Does Patient Have a Medical Advance Directive? Yes  Type of Advance Directive [place]  Does patient want to make changes to medical advance directive? No - Patient declined  Copy of [place] in Chart? Yes  Would patient like information on creating a medical advance directive? -  Pre-existing out of facility DNR order (yellow form or pink MOST form) -     Chief Complaint  Patient presents with  . Medical Management of Chronic Issues    3 month follow-up, here with daughter (in waiting area). Discuss labs (copy given to patient)   . Medication Refill    No refills needed     HPI: Patient is a 84 y.o. female seen today for medical management of chronic diseases.  Right leg scar still "oozes" occasionally with shooting pain. No bleeding. DOI 08/04/17 and wound was complex and she rec'd several sutures. She did require abx tx  2/2 wound became infected.   She reports she is "always" clearing her throat and has post nasal drip and rhinorrhea. Sx's minimally improve with claritin.  Chronic back pain - controlled on prn tramadol. MRI L spine in 12/2016 revealed severe DDD with left L5-S1 foraminal L5 compression; moderate-severe stenosis at L3-L4. She continues to have numbness in LLE that she only feels at night while in the bed as "a tight band" around left foot.  Seasonal allergic rhinitis - uncontrolled. She takes prn OTC anthistamine and flonase. (+) rhinorrhea and post nasal drip  PAF - stable. rate controlled on cardizem LA and metoprolol succ. She takes eliquis for anticoagulation. Followed by cardiology. She had a successful cardioversion in Arril 2017 but later reverted back to afib. She was hospitalized in Jan 2017 for afib w RVR, acute CHF and acute  pulmonary edema. 2D echo showed EF 50-55%. Her last ECG in Apr 2018 showed afib.  Hx CHF - takes lasix daily prn "feeling bloated". Followed by cardiology. No recent weight gain  COPD/hx tobacco abuse - stable off meds. She no longer smokes. No exacerbations  Arthritis - pain stable. Her pain is improved on tramadol prn and also uses topical OTC lidocaine on wrists for carpal tunnel discomfort. She has taken ASA in the past but was told not to combine it with eliquis. She is intolerant of tylenol.   Hypothyroidism - controlled. takes brand name only synthroid 189mg daily. TSH 0.24; free T4 1.6. She no longer sees Endo. She is taking med fasting and 1st thing in the AM.     Past Medical History:  Diagnosis Date  . [place]   HOSPITALIZED   . Arthritis   . Cataract   . Hypothyroidism   . Permanent atrial fibrillation ([place]   . Thyroid disease     Past Surgical History:  Procedure Laterality Date  . CARDIOVERSION N/A 06/08/2015   Procedure: CARDIOVERSION;  Surgeon: MJerline Pain MD;  [facility]  Service: Cardiovascular;  Laterality: N/A;  . CARDIOVERSION N/A 09/02/2015   Procedure: CARDIOVERSION;  Surgeon: SHerminio Commons MD;  [place] [facility]  Service: Cardiovascular;  Laterality: N/A;  . CATARACT EXTRACTION, BILATERAL Bilateral 2010  . JOINT REPLACEMENT    . KNEE SURGERY  2002  . right hip replacement    .  RIGHT KNEE REPLACEMENT  2011  . TEE WITHOUT CARDIOVERSION N/A 06/08/2015   Procedure: TRANSESOPHAGEAL ECHOCARDIOGRAM (TEE);  Surgeon: Jerline Pain, MD;  Location: [facility];  Service: Cardiovascular;  Laterality: N/A;  . TOTAL HIP ARTHROPLASTY Right 2011  . TOTAL SHOULDER REPLACEMENT Right 2003  . TOTAL SHOULDER REPLACEMENT Left 2007  . WRIST SURGERY Bilateral 2008   both wrists     reports that she quit smoking about 6 years ago. Her smoking use included cigarettes. She has a 65.00 pack-year smoking history. She has never used  smokeless tobacco. She reports that she drinks about 0.6 - 1.2 oz of alcohol per week. She reports that she does not use drugs. Social History   Socioeconomic History  . Marital status: Widowed    Spouse name: Not on file  . Number of children: Not on file  . Years of education: Not on file  . Highest education level: Not on file  Occupational History  . Not on file  Social Needs  . Financial resource strain: Not hard at all  . Food insecurity:    Worry: Never true    Inability: Never true  . Transportation needs:    Medical: No    Non-medical: No  Tobacco Use  . Smoking status: Former Smoker    Packs/day: 1.00    Years: 65.00    Pack years: 65.00    Types: Cigarettes    Last attempt to quit: 05/28/2011    Years since quitting: 6.4  . Smokeless tobacco: Never Used  Substance and Sexual Activity  . Alcohol use: Yes    Alcohol/week: 0.6 - 1.2 oz    Types: 1 - 2 Glasses of wine per week    Comment: occasional drink  . Drug use: No  . Sexual activity: Not on file  Lifestyle  . Physical activity:    Days per week: 0 days    Minutes per session: 0 min  . Stress: Only a little  Relationships  . Social connections:    Talks on phone: More than three times a week    Gets together: More than three times a week    Attends religious service: Never    Active member of club or organization: No    Attends meetings of clubs or organizations: Never    Relationship status: Widowed  . Intimate partner violence:    Fear of current or ex partner: No    Emotionally abused: No    Physically abused: No    Forced sexual activity: No  Other Topics Concern  . Not on file  Social History Narrative   DIET: NONE      DO YOU DRINK/EAT THINGS WITH CAFFEINE: YES, COFFEE AND TEA       MARITAL STATUS: WIDOWED      WHAT YEAR WERE YOU MARRIED:1950      DO YOU LIVE IN A HOUSE, APARTMENT, ASSISTED LIVING, CONDO TRAILER ETC.: HOUSE       IS IT ONE OR MORE STORIES: 2 STORIES      HOW MANY  PERSONS LIVE IN YOUR HOME: 1      DO YOU HAVE PETS IN YOUR HOME: 1 CAT AND A PART TIME DOG       CURRENT OR PAST PROFESSION: CIVIL SERVANT      DO YOU EXERCISE: NO       WHAT TYPE AND HOW OFTEN:NO    Family History  Problem Relation Age of Onset  . Hypercalcemia Daughter   .  Hypertension Daughter     Allergies  Allergen Reactions  . Banana Nausea Only    All banana products also  . Penicillins Hives    Outpatient Encounter Medications as of 11/14/2017  Medication Sig  . ascorbic acid (VITAMIN C) 500 MG tablet Take 1,000 mg by mouth daily.  Marland Kitchen CARDIZEM LA 120 MG 24 hr tablet TAKE 1 TABLET (120 MG TOTAL) BY MOUTH 2 (TWO) TIMES DAILY.  . cholecalciferol (VITAMIN D) 1000 units tablet Take 1,000 Units by mouth daily.  Marland Kitchen ELIQUIS 5 MG TABS tablet TAKE 1 TABLET (5 MG TOTAL) BY MOUTH 2 (TWO) TIMES DAILY.  . furosemide (LASIX) 20 MG tablet TAKE 1 TABLET DAILY AS NEEDED FOR WEIGHT GAIN >3LBS.  Marland Kitchen gabapentin (NEURONTIN) 300 MG capsule TAKE 1 CAPSULE BY MOUTH EVERYDAY AT BEDTIME  . metoprolol succinate (TOPROL-XL) 25 MG 24 hr tablet TAKE 1 TABLET BY MOUTH TWICE A DAY  . Multiple Vitamin (MULTIVITAMIN) capsule Take 1 capsule by mouth daily.  Marland Kitchen SYNTHROID 125 MCG tablet TAKE 1 TABLET (125 MCG TOTAL) BY MOUTH DAILY BEFORE BREAKFAST.  Marland Kitchen traMADol (ULTRAM) 50 MG tablet Take 1 tablet (50 mg total) by mouth every 8 (eight) hours as needed. for pain  . vitamin E 400 UNIT capsule Take 400 Units by mouth daily.   No facility-administered encounter medications on file as of 11/14/2017.     Review of Systems:  Review of Systems  HENT: Positive for postnasal drip and rhinorrhea.   Cardiovascular: Positive for palpitations.  Skin: Positive for wound.  All other systems reviewed and are negative.   Health Maintenance  Topic Date Due  . INFLUENZA VACCINE  12/20/2017  . TETANUS/TDAP  10/11/2026  . DEXA SCAN  Completed  . PNA vac Low Risk Adult  Completed    Physical Exam: Vitals:   11/14/17  1037  BP: 118/76  Pulse: 85  Temp: 97.8 F (36.6 C)  TempSrc: Oral  SpO2: 97%  Weight: 146 lb (66.2 kg)  Height: 5' 3" (1.6 m)   Body mass index is 25.86 kg/m. Physical Exam  Constitutional: She is oriented to person, place, and time. She appears well-developed and well-nourished.  HENT:  Mouth/Throat: Oropharynx is clear and moist. No oropharyngeal exudate.  MMM; no oral thrush  Eyes: Pupils are equal, round, and reactive to light. No scleral icterus.  Neck: Neck supple. Carotid bruit is not present. No tracheal deviation present. No thyromegaly present.  Cardiovascular: Normal rate, regular rhythm and intact distal pulses. Exam reveals no gallop and no friction rub.  Murmur (1/6 SEM) heard. No LE edema b/l. no calf TTP.   Pulmonary/Chest: Effort normal and breath sounds normal. No stridor. No respiratory distress. She has no wheezes. She has no rales.  Abdominal: Soft. Normal appearance and bowel sounds are normal. She exhibits no distension and no mass. There is no hepatomegaly. There is no tenderness. There is no rigidity, no rebound and no guarding. No hernia.  obese  Musculoskeletal: She exhibits edema (small and large joints).  Lymphadenopathy:    She has no cervical adenopathy.  Neurological: She is alert and oriented to person, place, and time. She has normal reflexes.  Skin: Skin is warm and dry. No rash noted.     Psychiatric: She has a normal mood and affect. Her behavior is normal. Judgment and thought content normal.    Labs reviewed: Basic Metabolic Panel: Recent Labs    01/03/17 0955 08/14/17 1042 10/01/17 1010 11/12/17 1039  NA 139 139  --   --  K 5.3 4.8  --   --   CL 101 100  --   --   CO2 22 33*  --   --   GLUCOSE 106* 100  --   --   BUN 12 16  --   --   CREATININE 0.84 0.82  --   --   CALCIUM 10.2 10.1  --   --   TSH 0.12* 0.08* 0.22* 0.24*   Liver Function Tests: Recent Labs    01/03/17 0955 08/14/17 1042  AST 14  --   ALT 7 9    ALKPHOS 74  --   BILITOT 0.5  --   PROT 6.7  --   ALBUMIN 4.2  --    No results for input(s): LIPASE, AMYLASE in the last 8760 hours. No results for input(s): AMMONIA in the last 8760 hours. CBC: Recent Labs    08/14/17 1042  WBC 6.9  NEUTROABS 4,816  HGB 13.3  HCT 39.8  MCV 88.6  PLT 337   Lipid Panel: No results for input(s): CHOL, HDL, LDLCALC, TRIG, CHOLHDL, LDLDIRECT in the last 8760 hours. No results found for: HGBA1C  Procedures since last visit: No results found.  Assessment/Plan   ICD-10-CM   1. Laceration of right leg excluding thigh, sequela S81.811S CT EXTREMITY LOWER RIGHT WO CONTRAST  2. Spinal stenosis of lumbar region with neurogenic claudication M48.062   3. Hypothyroidism due to acquired atrophy of thyroid E03.4   4. PAF (paroxysmal atrial fibrillation) (HCC) I48.0   5. Seasonal allergic rhinitis due to pollen J30.1 montelukast (SINGULAIR) 10 MG tablet  6. High risk medication use Z79.899 BMP with eGFR([facility])    START SINGULAIR (MONTELUKAST) 10 MG AT BEDTIME FOR SEASONAL ALLERGIES  Continue other medications as ordered  Will call with CT leg appointment  Follow up in 3 mos for RLE wound, COPD, PAF, seasonal allergy   Glorie Dowlen S. Perlie Gold  [facility] [address] [place], [place] [address] [phone] Cell (Monday-Friday 8 AM - 5 PM) [phone] After 5 PM and follow prompts

## 2017-11-14 NOTE — Patient Instructions (Addendum)
START SINGULAIR (MONTELUKAST) 10 MG AT BEDTIME FOR SEASONAL ALLERGIES  Continue other medications as ordered  Will call with CT leg appointment  Follow up in 3 mos for RLE wound, COPD, PAF, seasonal allergy

## 2017-11-19 ENCOUNTER — Ambulatory Visit
Admission: RE | Admit: 2017-11-19 | Discharge: 2017-11-19 | Disposition: A | Discharge status: Home / Self Care | Payer: Medicare Other | Source: Ambulatory Visit | Attending: Internal Medicine | Admitting: Internal Medicine

## 2017-11-19 ENCOUNTER — Telehealth: Payer: Self-pay

## 2017-11-19 ENCOUNTER — Other Ambulatory Visit: Payer: Self-pay | Admitting: Internal Medicine

## 2017-11-19 DIAGNOSIS — L089 Local infection of the skin and subcutaneous tissue, unspecified: Secondary | ICD-10-CM

## 2017-11-19 DIAGNOSIS — S81811S Laceration without foreign body, right lower leg, sequela: Secondary | ICD-10-CM

## 2017-11-19 DIAGNOSIS — T148XXA Other injury of unspecified body region, initial encounter: Secondary | ICD-10-CM

## 2017-11-19 DIAGNOSIS — S81801A Unspecified open wound, right lower leg, initial encounter: Secondary | ICD-10-CM | POA: Diagnosis not present

## 2017-11-19 MED ORDER — IOPAMIDOL (ISOVUE-300) INJECTION 61%
100.0000 mL | Freq: Once | INTRAVENOUS | Status: AC | PRN
  Administered 2017-11-19: 100 mL via INTRAVENOUS

## 2017-11-19 MED ORDER — CLINDAMYCIN HCL 300 MG PO CAPS: 300.0000 mg | ORAL_CAPSULE | Freq: Three times a day (TID) | ORAL | 0 refills | Status: DC

## 2017-11-19 NOTE — Telephone Encounter (Signed)
-----   Message from MaroaMonica Carter, [place]DO sent at 11/19/2017  3:02 PM EDT ----- CT leg reveals no bone infection but she still has a mild soft tissue infection along with open wound - recommend referral to wound clinic for further tx of delayed healing of right leg wound; start Rx clindamycin 300mg  #30 take 1 tab po TID with no RF; take OTC probiotic daily while on antibiotic; elevate leg; follow up as scheduled

## 2017-11-19 NOTE — Telephone Encounter (Signed)
Discussed results with patient, patient verbalized understanding of results  RX sent to pharmacy and referral order placed  S.Chrae B/CMA

## 2017-11-22 ENCOUNTER — Other Ambulatory Visit: Payer: Self-pay | Admitting: Internal Medicine

## 2017-12-10 ENCOUNTER — Other Ambulatory Visit (HOSPITAL_COMMUNITY): Payer: Self-pay | Admitting: Nurse Practitioner

## 2017-12-10 ENCOUNTER — Encounter (HOSPITAL_BASED_OUTPATIENT_CLINIC_OR_DEPARTMENT_OTHER): Payer: Medicare Other | Attending: Internal Medicine

## 2017-12-10 DIAGNOSIS — Z96641 Presence of right artificial hip joint: Secondary | ICD-10-CM | POA: Insufficient documentation

## 2017-12-10 DIAGNOSIS — Z7901 Long term (current) use of anticoagulants: Secondary | ICD-10-CM | POA: Diagnosis not present

## 2017-12-10 DIAGNOSIS — E039 Hypothyroidism, unspecified: Secondary | ICD-10-CM | POA: Diagnosis not present

## 2017-12-10 DIAGNOSIS — G629 Polyneuropathy, unspecified: Secondary | ICD-10-CM | POA: Insufficient documentation

## 2017-12-10 DIAGNOSIS — Z96612 Presence of left artificial shoulder joint: Secondary | ICD-10-CM | POA: Diagnosis not present

## 2017-12-10 DIAGNOSIS — L97812 Non-pressure chronic ulcer of other part of right lower leg with fat layer exposed: Secondary | ICD-10-CM | POA: Diagnosis not present

## 2017-12-10 DIAGNOSIS — Z96611 Presence of right artificial shoulder joint: Secondary | ICD-10-CM | POA: Diagnosis not present

## 2017-12-10 DIAGNOSIS — I87311 Chronic venous hypertension (idiopathic) with ulcer of right lower extremity: Secondary | ICD-10-CM | POA: Diagnosis not present

## 2017-12-10 DIAGNOSIS — Z96659 Presence of unspecified artificial knee joint: Secondary | ICD-10-CM | POA: Insufficient documentation

## 2017-12-10 DIAGNOSIS — I4821 Permanent atrial fibrillation: Secondary | ICD-10-CM

## 2017-12-10 DIAGNOSIS — I482 Chronic atrial fibrillation: Secondary | ICD-10-CM | POA: Insufficient documentation

## 2017-12-10 DIAGNOSIS — J449 Chronic obstructive pulmonary disease, unspecified: Secondary | ICD-10-CM | POA: Diagnosis not present

## 2017-12-10 DIAGNOSIS — L97213 Non-pressure chronic ulcer of right calf with necrosis of muscle: Secondary | ICD-10-CM | POA: Insufficient documentation

## 2017-12-14 ENCOUNTER — Telehealth: Payer: Self-pay

## 2017-12-14 DIAGNOSIS — L97213 Non-pressure chronic ulcer of right calf with necrosis of muscle: Secondary | ICD-10-CM | POA: Diagnosis not present

## 2017-12-14 DIAGNOSIS — Z87891 Personal history of nicotine dependence: Secondary | ICD-10-CM | POA: Diagnosis not present

## 2017-12-14 DIAGNOSIS — Z7901 Long term (current) use of anticoagulants: Secondary | ICD-10-CM | POA: Diagnosis not present

## 2017-12-14 DIAGNOSIS — E039 Hypothyroidism, unspecified: Secondary | ICD-10-CM | POA: Diagnosis not present

## 2017-12-14 DIAGNOSIS — I87311 Chronic venous hypertension (idiopathic) with ulcer of right lower extremity: Secondary | ICD-10-CM | POA: Diagnosis not present

## 2017-12-14 DIAGNOSIS — M48061 Spinal stenosis, lumbar region without neurogenic claudication: Secondary | ICD-10-CM | POA: Diagnosis not present

## 2017-12-14 DIAGNOSIS — J449 Chronic obstructive pulmonary disease, unspecified: Secondary | ICD-10-CM | POA: Diagnosis not present

## 2017-12-14 DIAGNOSIS — I482 Chronic atrial fibrillation: Secondary | ICD-10-CM | POA: Diagnosis not present

## 2017-12-14 NOTE — Telephone Encounter (Signed)
Angel with [facility] called to request verbal orders on patient. Angel requested the following:  3 times weekly for 9 weeks for wound care, medication teaching, patient safety status in the home assessment, and cardio/pulmonary assessment.   Verbal orders were given

## 2017-12-17 DIAGNOSIS — L97213 Non-pressure chronic ulcer of right calf with necrosis of muscle: Secondary | ICD-10-CM | POA: Diagnosis not present

## 2017-12-17 DIAGNOSIS — J449 Chronic obstructive pulmonary disease, unspecified: Secondary | ICD-10-CM | POA: Diagnosis not present

## 2017-12-17 DIAGNOSIS — L97812 Non-pressure chronic ulcer of other part of right lower leg with fat layer exposed: Secondary | ICD-10-CM | POA: Diagnosis not present

## 2017-12-17 DIAGNOSIS — I872 Venous insufficiency (chronic) (peripheral): Secondary | ICD-10-CM | POA: Diagnosis not present

## 2017-12-17 DIAGNOSIS — Z7901 Long term (current) use of anticoagulants: Secondary | ICD-10-CM | POA: Diagnosis not present

## 2017-12-17 DIAGNOSIS — E039 Hypothyroidism, unspecified: Secondary | ICD-10-CM | POA: Diagnosis not present

## 2017-12-17 DIAGNOSIS — I482 Chronic atrial fibrillation: Secondary | ICD-10-CM | POA: Diagnosis not present

## 2017-12-17 DIAGNOSIS — I87311 Chronic venous hypertension (idiopathic) with ulcer of right lower extremity: Secondary | ICD-10-CM | POA: Diagnosis not present

## 2017-12-19 DIAGNOSIS — J449 Chronic obstructive pulmonary disease, unspecified: Secondary | ICD-10-CM | POA: Diagnosis not present

## 2017-12-19 DIAGNOSIS — I87311 Chronic venous hypertension (idiopathic) with ulcer of right lower extremity: Secondary | ICD-10-CM | POA: Diagnosis not present

## 2017-12-19 DIAGNOSIS — E039 Hypothyroidism, unspecified: Secondary | ICD-10-CM | POA: Diagnosis not present

## 2017-12-19 DIAGNOSIS — L97213 Non-pressure chronic ulcer of right calf with necrosis of muscle: Secondary | ICD-10-CM | POA: Diagnosis not present

## 2017-12-19 DIAGNOSIS — M48061 Spinal stenosis, lumbar region without neurogenic claudication: Secondary | ICD-10-CM | POA: Diagnosis not present

## 2017-12-19 DIAGNOSIS — I482 Chronic atrial fibrillation: Secondary | ICD-10-CM | POA: Diagnosis not present

## 2017-12-21 DIAGNOSIS — J449 Chronic obstructive pulmonary disease, unspecified: Secondary | ICD-10-CM | POA: Diagnosis not present

## 2017-12-21 DIAGNOSIS — M48061 Spinal stenosis, lumbar region without neurogenic claudication: Secondary | ICD-10-CM | POA: Diagnosis not present

## 2017-12-21 DIAGNOSIS — L97213 Non-pressure chronic ulcer of right calf with necrosis of muscle: Secondary | ICD-10-CM | POA: Diagnosis not present

## 2017-12-21 DIAGNOSIS — E039 Hypothyroidism, unspecified: Secondary | ICD-10-CM | POA: Diagnosis not present

## 2017-12-21 DIAGNOSIS — I482 Chronic atrial fibrillation: Secondary | ICD-10-CM | POA: Diagnosis not present

## 2017-12-21 DIAGNOSIS — I87311 Chronic venous hypertension (idiopathic) with ulcer of right lower extremity: Secondary | ICD-10-CM | POA: Diagnosis not present

## 2017-12-24 DIAGNOSIS — I482 Chronic atrial fibrillation: Secondary | ICD-10-CM | POA: Diagnosis not present

## 2017-12-24 DIAGNOSIS — E039 Hypothyroidism, unspecified: Secondary | ICD-10-CM | POA: Diagnosis not present

## 2017-12-24 DIAGNOSIS — J449 Chronic obstructive pulmonary disease, unspecified: Secondary | ICD-10-CM | POA: Diagnosis not present

## 2017-12-24 DIAGNOSIS — I87311 Chronic venous hypertension (idiopathic) with ulcer of right lower extremity: Secondary | ICD-10-CM | POA: Diagnosis not present

## 2017-12-24 DIAGNOSIS — M48061 Spinal stenosis, lumbar region without neurogenic claudication: Secondary | ICD-10-CM | POA: Diagnosis not present

## 2017-12-24 DIAGNOSIS — L97213 Non-pressure chronic ulcer of right calf with necrosis of muscle: Secondary | ICD-10-CM | POA: Diagnosis not present

## 2017-12-26 DIAGNOSIS — I482 Chronic atrial fibrillation: Secondary | ICD-10-CM | POA: Diagnosis not present

## 2017-12-26 DIAGNOSIS — I87311 Chronic venous hypertension (idiopathic) with ulcer of right lower extremity: Secondary | ICD-10-CM | POA: Diagnosis not present

## 2017-12-26 DIAGNOSIS — E039 Hypothyroidism, unspecified: Secondary | ICD-10-CM | POA: Diagnosis not present

## 2017-12-26 DIAGNOSIS — M48061 Spinal stenosis, lumbar region without neurogenic claudication: Secondary | ICD-10-CM | POA: Diagnosis not present

## 2017-12-26 DIAGNOSIS — L97213 Non-pressure chronic ulcer of right calf with necrosis of muscle: Secondary | ICD-10-CM | POA: Diagnosis not present

## 2017-12-26 DIAGNOSIS — J449 Chronic obstructive pulmonary disease, unspecified: Secondary | ICD-10-CM | POA: Diagnosis not present

## 2017-12-28 DIAGNOSIS — I482 Chronic atrial fibrillation: Secondary | ICD-10-CM | POA: Diagnosis not present

## 2017-12-28 DIAGNOSIS — J449 Chronic obstructive pulmonary disease, unspecified: Secondary | ICD-10-CM | POA: Diagnosis not present

## 2017-12-28 DIAGNOSIS — M48061 Spinal stenosis, lumbar region without neurogenic claudication: Secondary | ICD-10-CM | POA: Diagnosis not present

## 2017-12-28 DIAGNOSIS — E039 Hypothyroidism, unspecified: Secondary | ICD-10-CM | POA: Diagnosis not present

## 2017-12-28 DIAGNOSIS — L97213 Non-pressure chronic ulcer of right calf with necrosis of muscle: Secondary | ICD-10-CM | POA: Diagnosis not present

## 2017-12-28 DIAGNOSIS — I87311 Chronic venous hypertension (idiopathic) with ulcer of right lower extremity: Secondary | ICD-10-CM | POA: Diagnosis not present

## 2017-12-31 ENCOUNTER — Encounter (HOSPITAL_BASED_OUTPATIENT_CLINIC_OR_DEPARTMENT_OTHER): Payer: Medicare Other | Attending: Internal Medicine

## 2017-12-31 DIAGNOSIS — G629 Polyneuropathy, unspecified: Secondary | ICD-10-CM | POA: Diagnosis not present

## 2017-12-31 DIAGNOSIS — I4891 Unspecified atrial fibrillation: Secondary | ICD-10-CM | POA: Diagnosis not present

## 2017-12-31 DIAGNOSIS — L97812 Non-pressure chronic ulcer of other part of right lower leg with fat layer exposed: Secondary | ICD-10-CM | POA: Diagnosis not present

## 2017-12-31 DIAGNOSIS — L97213 Non-pressure chronic ulcer of right calf with necrosis of muscle: Secondary | ICD-10-CM | POA: Diagnosis not present

## 2017-12-31 DIAGNOSIS — I87311 Chronic venous hypertension (idiopathic) with ulcer of right lower extremity: Secondary | ICD-10-CM | POA: Insufficient documentation

## 2018-01-02 DIAGNOSIS — J449 Chronic obstructive pulmonary disease, unspecified: Secondary | ICD-10-CM | POA: Diagnosis not present

## 2018-01-02 DIAGNOSIS — L97213 Non-pressure chronic ulcer of right calf with necrosis of muscle: Secondary | ICD-10-CM | POA: Diagnosis not present

## 2018-01-02 DIAGNOSIS — E039 Hypothyroidism, unspecified: Secondary | ICD-10-CM | POA: Diagnosis not present

## 2018-01-02 DIAGNOSIS — M48061 Spinal stenosis, lumbar region without neurogenic claudication: Secondary | ICD-10-CM | POA: Diagnosis not present

## 2018-01-02 DIAGNOSIS — I482 Chronic atrial fibrillation: Secondary | ICD-10-CM | POA: Diagnosis not present

## 2018-01-02 DIAGNOSIS — I87311 Chronic venous hypertension (idiopathic) with ulcer of right lower extremity: Secondary | ICD-10-CM | POA: Diagnosis not present

## 2018-01-04 DIAGNOSIS — L97213 Non-pressure chronic ulcer of right calf with necrosis of muscle: Secondary | ICD-10-CM | POA: Diagnosis not present

## 2018-01-04 DIAGNOSIS — J449 Chronic obstructive pulmonary disease, unspecified: Secondary | ICD-10-CM | POA: Diagnosis not present

## 2018-01-04 DIAGNOSIS — M48061 Spinal stenosis, lumbar region without neurogenic claudication: Secondary | ICD-10-CM | POA: Diagnosis not present

## 2018-01-04 DIAGNOSIS — I87311 Chronic venous hypertension (idiopathic) with ulcer of right lower extremity: Secondary | ICD-10-CM | POA: Diagnosis not present

## 2018-01-04 DIAGNOSIS — I482 Chronic atrial fibrillation: Secondary | ICD-10-CM | POA: Diagnosis not present

## 2018-01-04 DIAGNOSIS — E039 Hypothyroidism, unspecified: Secondary | ICD-10-CM | POA: Diagnosis not present

## 2018-01-07 ENCOUNTER — Encounter: Payer: Self-pay | Admitting: Internal Medicine

## 2018-01-07 ENCOUNTER — Ambulatory Visit (INDEPENDENT_AMBULATORY_CARE_PROVIDER_SITE_OTHER): Payer: Medicare Other | Admitting: Internal Medicine

## 2018-01-07 VITALS — BP 112/72 | HR 65 | Temp 98.6°F | Ht 63.0 in | Wt 144.6 lb

## 2018-01-07 DIAGNOSIS — M48061 Spinal stenosis, lumbar region without neurogenic claudication: Secondary | ICD-10-CM | POA: Diagnosis not present

## 2018-01-07 DIAGNOSIS — F339 Major depressive disorder, recurrent, unspecified: Secondary | ICD-10-CM | POA: Diagnosis not present

## 2018-01-07 DIAGNOSIS — J449 Chronic obstructive pulmonary disease, unspecified: Secondary | ICD-10-CM | POA: Diagnosis not present

## 2018-01-07 DIAGNOSIS — E039 Hypothyroidism, unspecified: Secondary | ICD-10-CM | POA: Diagnosis not present

## 2018-01-07 DIAGNOSIS — I87311 Chronic venous hypertension (idiopathic) with ulcer of right lower extremity: Secondary | ICD-10-CM | POA: Diagnosis not present

## 2018-01-07 DIAGNOSIS — I482 Chronic atrial fibrillation: Secondary | ICD-10-CM

## 2018-01-07 DIAGNOSIS — H6123 Impacted cerumen, bilateral: Secondary | ICD-10-CM | POA: Diagnosis not present

## 2018-01-07 DIAGNOSIS — E034 Atrophy of thyroid (acquired): Secondary | ICD-10-CM

## 2018-01-07 DIAGNOSIS — L97213 Non-pressure chronic ulcer of right calf with necrosis of muscle: Secondary | ICD-10-CM | POA: Diagnosis not present

## 2018-01-07 DIAGNOSIS — I4821 Permanent atrial fibrillation: Secondary | ICD-10-CM

## 2018-01-07 NOTE — Patient Instructions (Signed)
Please do not use Q-tips as this simply packs the wax down against he eardrum. Should wax build up occur, please put 2-3 drops of mineral oil in the ear at night and cover the canal with a  cotton ball.In the morning fill the canal with hydrogen peroxide & leave  for 10-15 minutes.Following this shower and use the thinnest washrag available to wick out the wax.  

## 2018-01-07 NOTE — Progress Notes (Signed)
   PCP: Dr Kirt BoysMonica Carter  This is a [facility] visit  follow up for specific acute issue of ear wax bilaterally.  Interim medical record and care since last [facility] visit was updated with review of diagnostic studies and change in clinical status since last visit were documented.  HPI: Her audiologist told her that she had bilateral wax impactions and recommended treatment here.  She was being seen for the 3-year warranty F/U of her hearing aids. She has no related symptoms such as pain.   She does describe chronic rhinitis and sneezing.  She also has occasional shortness of breath which she relates to her atrial fibrillation.  Medical diagnoses include hypothyroidism, osteoarthritis, and permanent A. Fib.  She has history of seasonal allergic rhinitis related to pollen.  She is on montelukast. She is on 125 mcg of L-thyroxine.  The most recent TSH was 0.24 on 11/12/2017. The patient has undergone cardioversion x2.  She has had multiple orthopedic surgeries and procedures. She drinks socially. She quit smoking in 2013 but has a 65-pack-year history.  Review of systems: She expressed some dismay about her advanced age and she expressed that the alternative to her present state of chronic ill health would not be objectionable.  She did admit to intermittent depression, she had no interest in addressing this at this time.   Constitutional: No fever  Eyes: No redness, discharge, pain, vision change ENT/mouth: No nasal congestion,  purulent discharge, earache, new change in hearing, sore throat  Cardiovascular: No chest pain, palpitations, paroxysmal nocturnal dyspnea, claudication, edema  Respiratory: No cough, sputum production, hemoptysis Allergy/immunology: No itchy/watery eyes, urticaria, angioedema  Physical exam:  Pertinent or positive findings: She wears hearing aids bilaterally.  There is some wax retention in each canal but there are no true impactions.  The left tympanic  membrane was more visible than the right. She has an upper plate and lower partial. Breath sounds are decreased.  Heart rhythm is irregular. She has marked osteoarthritic changes of the hands. After lavage there was no residual wax in the canals.  The left tympanic membrane revealed scarring and some retraction.  General appearance: Adequately nourished; no acute distress, increased work of breathing is present.   Lymphatic: No lymphadenopathy about the head, neck, axilla. Eyes: No conjunctival inflammation or lid edema is present. There is no scleral icterus. Ears:  External ear exam shows no significant lesions or deformities.   Nose:  External nasal examination shows no deformity or inflammation. Nasal mucosa are pink and moist without lesions, exudates Oral exam:  Lips and gums are healthy appearing.  Neck:  No masses, tenderness noted.    Heart:  No gallop, murmur, click, rub .  Lungs:  without wheezes, rhonchi, rales, rubs. Skin: Warm & dry w/o tenting. No significant lesions or rash.  Dx: 1) Bilateral cerumen retention without true impactions 2) permanent atrial fibrillation 3) hypothyroidim with TSH < 0.40 4) intermittent depression Plan: otic gavage

## 2018-01-07 NOTE — Assessment & Plan Note (Addendum)
11/12/2017 TSH 0.24 on 125 mcg of L-thyroxine  I mentioned the possibility of lowering L-thyroxine dose to 112 mcg daily with TSH goal of 1-3 as there is no history of thyroid cancer.  She categorically states she has no interest in lowering the dose as she became "hypothyroid" with hair loss when the dose was reduced previously.

## 2018-01-07 NOTE — Assessment & Plan Note (Signed)
Rate controlled 

## 2018-01-07 NOTE — Assessment & Plan Note (Signed)
See 01/07/18 she declined treatment at this time.  I asked her to discuss this with Dr. Montez Moritaarter at her next visit.

## 2018-01-09 ENCOUNTER — Encounter: Payer: Self-pay | Admitting: Internal Medicine

## 2018-01-09 DIAGNOSIS — L97213 Non-pressure chronic ulcer of right calf with necrosis of muscle: Secondary | ICD-10-CM | POA: Diagnosis not present

## 2018-01-09 DIAGNOSIS — E039 Hypothyroidism, unspecified: Secondary | ICD-10-CM | POA: Diagnosis not present

## 2018-01-09 DIAGNOSIS — J449 Chronic obstructive pulmonary disease, unspecified: Secondary | ICD-10-CM | POA: Diagnosis not present

## 2018-01-09 DIAGNOSIS — I87311 Chronic venous hypertension (idiopathic) with ulcer of right lower extremity: Secondary | ICD-10-CM | POA: Diagnosis not present

## 2018-01-09 DIAGNOSIS — I482 Chronic atrial fibrillation: Secondary | ICD-10-CM | POA: Diagnosis not present

## 2018-01-09 DIAGNOSIS — M48061 Spinal stenosis, lumbar region without neurogenic claudication: Secondary | ICD-10-CM | POA: Diagnosis not present

## 2018-01-11 DIAGNOSIS — L97213 Non-pressure chronic ulcer of right calf with necrosis of muscle: Secondary | ICD-10-CM | POA: Diagnosis not present

## 2018-01-11 DIAGNOSIS — E039 Hypothyroidism, unspecified: Secondary | ICD-10-CM | POA: Diagnosis not present

## 2018-01-11 DIAGNOSIS — I482 Chronic atrial fibrillation: Secondary | ICD-10-CM | POA: Diagnosis not present

## 2018-01-11 DIAGNOSIS — J449 Chronic obstructive pulmonary disease, unspecified: Secondary | ICD-10-CM | POA: Diagnosis not present

## 2018-01-11 DIAGNOSIS — I87311 Chronic venous hypertension (idiopathic) with ulcer of right lower extremity: Secondary | ICD-10-CM | POA: Diagnosis not present

## 2018-01-11 DIAGNOSIS — M48061 Spinal stenosis, lumbar region without neurogenic claudication: Secondary | ICD-10-CM | POA: Diagnosis not present

## 2018-01-14 DIAGNOSIS — G629 Polyneuropathy, unspecified: Secondary | ICD-10-CM | POA: Diagnosis not present

## 2018-01-14 DIAGNOSIS — I4891 Unspecified atrial fibrillation: Secondary | ICD-10-CM | POA: Diagnosis not present

## 2018-01-14 DIAGNOSIS — L97812 Non-pressure chronic ulcer of other part of right lower leg with fat layer exposed: Secondary | ICD-10-CM | POA: Diagnosis not present

## 2018-01-14 DIAGNOSIS — I87311 Chronic venous hypertension (idiopathic) with ulcer of right lower extremity: Secondary | ICD-10-CM | POA: Diagnosis not present

## 2018-01-14 DIAGNOSIS — L97213 Non-pressure chronic ulcer of right calf with necrosis of muscle: Secondary | ICD-10-CM | POA: Diagnosis not present

## 2018-01-16 DIAGNOSIS — I87311 Chronic venous hypertension (idiopathic) with ulcer of right lower extremity: Secondary | ICD-10-CM | POA: Diagnosis not present

## 2018-01-16 DIAGNOSIS — L97213 Non-pressure chronic ulcer of right calf with necrosis of muscle: Secondary | ICD-10-CM | POA: Diagnosis not present

## 2018-01-16 DIAGNOSIS — E039 Hypothyroidism, unspecified: Secondary | ICD-10-CM | POA: Diagnosis not present

## 2018-01-16 DIAGNOSIS — I482 Chronic atrial fibrillation: Secondary | ICD-10-CM | POA: Diagnosis not present

## 2018-01-16 DIAGNOSIS — M48061 Spinal stenosis, lumbar region without neurogenic claudication: Secondary | ICD-10-CM | POA: Diagnosis not present

## 2018-01-16 DIAGNOSIS — J449 Chronic obstructive pulmonary disease, unspecified: Secondary | ICD-10-CM | POA: Diagnosis not present

## 2018-01-18 DIAGNOSIS — E039 Hypothyroidism, unspecified: Secondary | ICD-10-CM | POA: Diagnosis not present

## 2018-01-18 DIAGNOSIS — L97213 Non-pressure chronic ulcer of right calf with necrosis of muscle: Secondary | ICD-10-CM | POA: Diagnosis not present

## 2018-01-18 DIAGNOSIS — M48061 Spinal stenosis, lumbar region without neurogenic claudication: Secondary | ICD-10-CM | POA: Diagnosis not present

## 2018-01-18 DIAGNOSIS — J449 Chronic obstructive pulmonary disease, unspecified: Secondary | ICD-10-CM | POA: Diagnosis not present

## 2018-01-18 DIAGNOSIS — I87311 Chronic venous hypertension (idiopathic) with ulcer of right lower extremity: Secondary | ICD-10-CM | POA: Diagnosis not present

## 2018-01-18 DIAGNOSIS — I482 Chronic atrial fibrillation: Secondary | ICD-10-CM | POA: Diagnosis not present

## 2018-01-21 DIAGNOSIS — M48061 Spinal stenosis, lumbar region without neurogenic claudication: Secondary | ICD-10-CM | POA: Diagnosis not present

## 2018-01-21 DIAGNOSIS — I87311 Chronic venous hypertension (idiopathic) with ulcer of right lower extremity: Secondary | ICD-10-CM | POA: Diagnosis not present

## 2018-01-21 DIAGNOSIS — L97213 Non-pressure chronic ulcer of right calf with necrosis of muscle: Secondary | ICD-10-CM | POA: Diagnosis not present

## 2018-01-21 DIAGNOSIS — E039 Hypothyroidism, unspecified: Secondary | ICD-10-CM | POA: Diagnosis not present

## 2018-01-21 DIAGNOSIS — J449 Chronic obstructive pulmonary disease, unspecified: Secondary | ICD-10-CM | POA: Diagnosis not present

## 2018-01-21 DIAGNOSIS — I482 Chronic atrial fibrillation: Secondary | ICD-10-CM | POA: Diagnosis not present

## 2018-01-23 DIAGNOSIS — J449 Chronic obstructive pulmonary disease, unspecified: Secondary | ICD-10-CM | POA: Diagnosis not present

## 2018-01-23 DIAGNOSIS — L97213 Non-pressure chronic ulcer of right calf with necrosis of muscle: Secondary | ICD-10-CM | POA: Diagnosis not present

## 2018-01-23 DIAGNOSIS — E039 Hypothyroidism, unspecified: Secondary | ICD-10-CM | POA: Diagnosis not present

## 2018-01-23 DIAGNOSIS — I482 Chronic atrial fibrillation: Secondary | ICD-10-CM | POA: Diagnosis not present

## 2018-01-23 DIAGNOSIS — I87311 Chronic venous hypertension (idiopathic) with ulcer of right lower extremity: Secondary | ICD-10-CM | POA: Diagnosis not present

## 2018-01-23 DIAGNOSIS — M48061 Spinal stenosis, lumbar region without neurogenic claudication: Secondary | ICD-10-CM | POA: Diagnosis not present

## 2018-01-28 ENCOUNTER — Encounter (HOSPITAL_BASED_OUTPATIENT_CLINIC_OR_DEPARTMENT_OTHER): Payer: Medicare Other | Attending: Internal Medicine

## 2018-01-28 DIAGNOSIS — G629 Polyneuropathy, unspecified: Secondary | ICD-10-CM | POA: Diagnosis not present

## 2018-01-28 DIAGNOSIS — Z872 Personal history of diseases of the skin and subcutaneous tissue: Secondary | ICD-10-CM | POA: Diagnosis not present

## 2018-01-28 DIAGNOSIS — Z09 Encounter for follow-up examination after completed treatment for conditions other than malignant neoplasm: Secondary | ICD-10-CM | POA: Insufficient documentation

## 2018-01-28 DIAGNOSIS — L97819 Non-pressure chronic ulcer of other part of right lower leg with unspecified severity: Secondary | ICD-10-CM | POA: Diagnosis not present

## 2018-01-28 DIAGNOSIS — I872 Venous insufficiency (chronic) (peripheral): Secondary | ICD-10-CM | POA: Diagnosis not present

## 2018-02-10 ENCOUNTER — Other Ambulatory Visit: Payer: Self-pay | Admitting: Internal Medicine

## 2018-02-10 DIAGNOSIS — J301 Allergic rhinitis due to pollen: Secondary | ICD-10-CM

## 2018-02-15 ENCOUNTER — Ambulatory Visit (INDEPENDENT_AMBULATORY_CARE_PROVIDER_SITE_OTHER): Payer: Medicare Other | Admitting: Internal Medicine

## 2018-02-15 ENCOUNTER — Encounter: Payer: Self-pay | Admitting: Internal Medicine

## 2018-02-15 VITALS — BP 112/70 | HR 85 | Temp 98.0°F | Ht 63.0 in | Wt 144.0 lb

## 2018-02-15 DIAGNOSIS — E034 Atrophy of thyroid (acquired): Secondary | ICD-10-CM

## 2018-02-15 DIAGNOSIS — F339 Major depressive disorder, recurrent, unspecified: Secondary | ICD-10-CM

## 2018-02-15 DIAGNOSIS — I480 Paroxysmal atrial fibrillation: Secondary | ICD-10-CM | POA: Diagnosis not present

## 2018-02-15 DIAGNOSIS — J301 Allergic rhinitis due to pollen: Secondary | ICD-10-CM

## 2018-02-15 DIAGNOSIS — M48062 Spinal stenosis, lumbar region with neurogenic claudication: Secondary | ICD-10-CM

## 2018-02-15 NOTE — Patient Instructions (Addendum)
Continue current medications as ordered  Follow up with cardiology as scheduled  Follow up in 4 mos with Shanda Bumps for routine visit.

## 2018-02-15 NOTE — Progress Notes (Signed)
Patient ID: Katrina Velez, female   DOB: 1925/10/09, 84 y.o.   MRN: [id number]   Location:  [facility] OFFICE  Provider: DR Elmon Kirschner  Code Status:  Goals of Care:  Advanced Directives 11/14/2017  Does Patient Have a Medical Advance Directive? Yes  Type of Advance Directive Healthcare Power of Attorney  Does patient want to make changes to medical advance directive? No - Patient declined  Copy of Healthcare Power of Attorney in Chart? Yes  Would patient like information on creating a medical advance directive? -  Pre-existing out of facility DNR order (yellow form or pink MOST form) -     Chief Complaint  Patient presents with  . Medical Management of Chronic Issues    follow-up     HPI: Patient is a 84 y.o. female seen today for medical management of chronic diseases.    Chronic back pain - controlled on prn tramadol. MRI L spine in 12/2016 revealed severe DDD with left L5-S1 foraminal L5 compression; moderate-severe stenosis at L3-L4. She continues to have numbness in LLE that she only feels at night while in the bed as "a tight band" around left foot.  Seasonal allergic rhinitis - uncontrolled. She takes prn OTC anthistamine and flonase. (+) rhinorrhea and post nasal drip  PAF - stable. rate controlled on cardizem LA and metoprolol succ. She takes eliquis for anticoagulation. Followed by cardiology. She had a successful cardioversion in Arril 2017 but later reverted back to afib. She was hospitalized in Jan 2017 for afib w RVR, acute CHF and acute pulmonary edema. 2D echo showed EF 50-55%. Her last ECG in Apr 2018 showed afib.  Hx CHF - takes lasix daily prn "feeling bloated". Followed by cardiology. No recent weight gain  COPD/hx tobacco abuse - stable off meds. She no longer smokes. No exacerbations  Arthritis - pain stable. Her pain is improved on tramadol prn and also uses topical OTC lidocaine on wrists for carpal tunnel discomfort. She has taken ASA in the past but was  told not to combine it with eliquis. She is intolerant of tylenol. Uses cane to ambulate  Hypothyroidism - controlled. takes brand name only synthroid daily and declines any med changes related to TSH. TSH 0.24; free T4 1.6. She no longer sees Endo. She is taking med fasting and 1st thing in the AM.    Past Medical History:  Diagnosis Date  . A-fib (HCC) 05/2015   HOSPITALIZED   . Arthritis   . Cataract   . Hypothyroidism   . Permanent atrial fibrillation (HCC)   . Thyroid disease     Past Surgical History:  Procedure Laterality Date  . CARDIOVERSION N/A 06/08/2015   Procedure: CARDIOVERSION;  Surgeon: Jake Bathe, MD;  Location: [facility] ENDOSCOPY;  Service: Cardiovascular;  Laterality: N/A;  . CARDIOVERSION N/A 09/02/2015   Procedure: CARDIOVERSION;  Surgeon: Laqueta Linden, MD;  Location: [facility] ENDOSCOPY;  Service: Cardiovascular;  Laterality: N/A;  . CATARACT EXTRACTION, BILATERAL Bilateral 2010  . JOINT REPLACEMENT    . KNEE SURGERY  2002  . right hip replacement    . RIGHT KNEE REPLACEMENT  2011  . TEE WITHOUT CARDIOVERSION N/A 06/08/2015   Procedure: TRANSESOPHAGEAL ECHOCARDIOGRAM (TEE);  Surgeon: Jake Bathe, MD;  Location: [facility] ENDOSCOPY;  Service: Cardiovascular;  Laterality: N/A;  . TOTAL HIP ARTHROPLASTY Right 2011  . TOTAL SHOULDER REPLACEMENT Right 2003  . TOTAL SHOULDER REPLACEMENT Left 2007  . WRIST SURGERY Bilateral 2008   both wrists  reports that she quit smoking about 6 years ago. Her smoking use included cigarettes. She has a 65.00 pack-year smoking history. She has never used smokeless tobacco. She reports that she drinks about 1.0 - 2.0 standard drinks of alcohol per week. She reports that she does not use drugs. Social History   Socioeconomic History  . Marital status: Widowed    Spouse name: Not on file  . Number of children: Not on file  . Years of education: Not on file  . Highest education level: Not on file  Occupational History  . Not  on file  Social Needs  . Financial resource strain: Not hard at all  . Food insecurity:    Worry: Never true    Inability: Never true  . Transportation needs:    Medical: No    Non-medical: No  Tobacco Use  . Smoking status: Former Smoker    Packs/day: 1.00    Years: 65.00    Pack years: 65.00    Types: Cigarettes    Last attempt to quit: 05/28/2011    Years since quitting: 6.7  . Smokeless tobacco: Never Used  Substance and Sexual Activity  . Alcohol use: Yes    Alcohol/week: 1.0 - 2.0 standard drinks    Types: 1 - 2 Glasses of wine per week    Comment: occasional drink  . Drug use: No  . Sexual activity: Not on file  Lifestyle  . Physical activity:    Days per week: 0 days    Minutes per session: 0 min  . Stress: Only a little  Relationships  . Social connections:    Talks on phone: More than three times a week    Gets together: More than three times a week    Attends religious service: Never    Active member of club or organization: No    Attends meetings of clubs or organizations: Never    Relationship status: Widowed  . Intimate partner violence:    Fear of current or ex partner: No    Emotionally abused: No    Physically abused: No    Forced sexual activity: No  Other Topics Concern  . Not on file  Social History Narrative   DIET: NONE      DO YOU DRINK/EAT THINGS WITH CAFFEINE: YES, COFFEE AND TEA       MARITAL STATUS: WIDOWED      WHAT YEAR WERE YOU MARRIED:1950      DO YOU LIVE IN A HOUSE, APARTMENT, ASSISTED LIVING, CONDO TRAILER ETC.: HOUSE       IS IT ONE OR MORE STORIES: 2 STORIES      HOW MANY PERSONS LIVE IN YOUR HOME: 1      DO YOU HAVE PETS IN YOUR HOME: 1 CAT AND A PART TIME DOG       CURRENT OR PAST PROFESSION: CIVIL SERVANT      DO YOU EXERCISE: NO       WHAT TYPE AND HOW OFTEN:NO    Family History  Problem Relation Age of Onset  . Hypercalcemia Daughter   . Hypertension Daughter     Allergies  Allergen Reactions  . Banana  Nausea Only    All banana products also  . Penicillins Hives    Outpatient Encounter Medications as of 02/15/2018  Medication Sig  . ascorbic acid (VITAMIN C) 500 MG tablet Take 1,000 mg by mouth daily.  Marland Kitchen CARDIZEM LA 120 MG 24 hr tablet TAKE 1 TABLET (120  MG TOTAL) BY MOUTH 2 (TWO) TIMES DAILY.  . cholecalciferol (VITAMIN D) 1000 units tablet Take 1,000 Units by mouth daily.  Marland Kitchen ELIQUIS 5 MG TABS tablet TAKE 1 TABLET (5 MG TOTAL) BY MOUTH 2 (TWO) TIMES DAILY.  . furosemide (LASIX) 20 MG tablet TAKE 1 TABLET DAILY AS NEEDED FOR WEIGHT GAIN >3LBS.  Marland Kitchen metoprolol succinate (TOPROL-XL) 25 MG 24 hr tablet TAKE 1 TABLET BY MOUTH TWICE A DAY  . Multiple Vitamin (MULTIVITAMIN) capsule Take 1 capsule by mouth daily.  Marland Kitchen SYNTHROID 125 MCG tablet TAKE 1 TABLET (125 MCG TOTAL) BY MOUTH DAILY BEFORE BREAKFAST.  Marland Kitchen traMADol (ULTRAM) 50 MG tablet Take 1 tablet (50 mg total) by mouth every 8 (eight) hours as needed. for pain  . vitamin E 400 UNIT capsule Take 400 Units by mouth daily.  . [DISCONTINUED] montelukast (SINGULAIR) 10 MG tablet TAKE 1 TABLET (10 MG TOTAL) BY MOUTH AT BEDTIME. FOR SEASONAL ALLERGY   No facility-administered encounter medications on file as of 02/15/2018.     Review of Systems:  Review of Systems  Cardiovascular: Positive for palpitations (rare) and leg swelling.  Musculoskeletal: Positive for arthralgias, gait problem and joint swelling.  All other systems reviewed and are negative.   Health Maintenance  Topic Date Due  . INFLUENZA VACCINE  12/20/2017  . TETANUS/TDAP  10/11/2026  . DEXA SCAN  Completed  . PNA vac Low Risk Adult  Completed    Physical Exam: Vitals:   02/15/18 1208  BP: 112/70  Pulse: 85  Temp: 98 F (36.7 C)  TempSrc: Oral  SpO2: 96%  Weight: 144 lb (65.3 kg)  Height: 5\' 3"  (1.6 m)   Body mass index is 25.51 kg/m. Physical Exam  Constitutional: She is oriented to person, place, and time. She appears well-developed and well-nourished.  HENT:    Mouth/Throat: Oropharynx is clear and moist. No oropharyngeal exudate.  MMM; no oral thrush  Eyes: Pupils are equal, round, and reactive to light. No scleral icterus.  Neck: Neck supple. Carotid bruit is not present. No tracheal deviation present. No thyromegaly present.  Cardiovascular: Normal rate and intact distal pulses. An irregularly irregular rhythm present. Exam reveals no gallop and no friction rub.  Murmur heard.  Systolic murmur is present with a grade of 1/6. Trace LE edema b/l. No calf TTP  Pulmonary/Chest: Effort normal and breath sounds normal. No stridor. No respiratory distress. She has no wheezes. She has no rales.  Abdominal: Soft. Normal appearance and bowel sounds are normal. She exhibits no distension and no mass. There is no hepatomegaly. There is no tenderness. There is no rigidity, no rebound and no guarding. No hernia.  Musculoskeletal: She exhibits edema (small and large joints).  Lymphadenopathy:    She has no cervical adenopathy.  Neurological: She is alert and oriented to person, place, and time. She has normal reflexes. Gait (uses cane) abnormal.  Skin: Skin is warm and dry. No rash noted.  Wound healed right anterior leg; right distal medial leg laceration healed  Psychiatric: She has a normal mood and affect. Her behavior is normal. Judgment and thought content normal.    Labs reviewed: Basic Metabolic Panel: Recent Labs    08/14/17 1042 10/01/17 1010 11/12/17 1039 11/14/17 1132  NA 139  --   --  140  K 4.8  --   --  4.8  CL 100  --   --  102  CO2 33*  --   --  31  GLUCOSE 100  --   --  92  BUN 16  --   --  15  CREATININE 0.82  --   --  0.84  CALCIUM 10.1  --   --  10.1  TSH 0.08* 0.22* 0.24*  --    Liver Function Tests: Recent Labs    08/14/17 1042  ALT 9   No results for input(s): LIPASE, AMYLASE in the last 8760 hours. No results for input(s): AMMONIA in the last 8760 hours. CBC: Recent Labs    08/14/17 1042  WBC 6.9  NEUTROABS  4,816  HGB 13.3  HCT 39.8  MCV 88.6  PLT 337   Lipid Panel: No results for input(s): CHOL, HDL, LDLCALC, TRIG, CHOLHDL, LDLDIRECT in the last 8760 hours. No results found for: HGBA1C  Procedures since last visit: No results found.  Assessment/Plan   ICD-10-CM   1. Hypothyroidism due to acquired atrophy of thyroid E03.4   2. PAF (paroxysmal atrial fibrillation) (HCC) I48.0   3. Depression, recurrent (HCC) F33.9   4. Seasonal allergic rhinitis due to pollen J30.1   5. Spinal stenosis of lumbar region with neurogenic claudication M48.062    She declined labs today  Plan to get flu shot at the end of Oct 2019 at local pharmacy  Continue current medications as ordered  Follow up with cardiology as scheduled  Follow up in 4 mos with Shanda Bumps for routine visit.    Jennavecia Schwier S. Ancil Linsey  [facility] [address] [place], [place] [address] [phone] Cell (Monday-Friday 8 AM - 5 PM) [phone] After 5 PM and follow prompts

## 2018-02-15 NOTE — Addendum Note (Signed)
Addended by: Sueanne Margarita on: 02/15/2018 02:33 PM   Modules accepted: Orders

## 2018-03-20 ENCOUNTER — Other Ambulatory Visit: Payer: Self-pay | Admitting: Internal Medicine

## 2018-03-20 ENCOUNTER — Other Ambulatory Visit (HOSPITAL_COMMUNITY): Payer: Self-pay | Admitting: Nurse Practitioner

## 2018-03-28 ENCOUNTER — Ambulatory Visit (HOSPITAL_COMMUNITY)
Admission: RE | Admit: 2018-03-28 | Discharge: 2018-03-28 | Disposition: A | Discharge status: Home / Self Care | Payer: Medicare Other | Source: Ambulatory Visit | Attending: Nurse Practitioner | Admitting: Nurse Practitioner

## 2018-03-28 ENCOUNTER — Encounter (HOSPITAL_COMMUNITY): Payer: Self-pay | Admitting: Nurse Practitioner

## 2018-03-28 VITALS — BP 118/74 | HR 78 | Ht 63.0 in | Wt 145.0 lb

## 2018-03-28 DIAGNOSIS — Z79899 Other long term (current) drug therapy: Secondary | ICD-10-CM | POA: Insufficient documentation

## 2018-03-28 DIAGNOSIS — Z79890 Hormone replacement therapy (postmenopausal): Secondary | ICD-10-CM | POA: Diagnosis not present

## 2018-03-28 DIAGNOSIS — Z79891 Long term (current) use of opiate analgesic: Secondary | ICD-10-CM | POA: Diagnosis not present

## 2018-03-28 DIAGNOSIS — E039 Hypothyroidism, unspecified: Secondary | ICD-10-CM | POA: Insufficient documentation

## 2018-03-28 DIAGNOSIS — Z23 Encounter for immunization: Secondary | ICD-10-CM | POA: Diagnosis not present

## 2018-03-28 DIAGNOSIS — I4821 Permanent atrial fibrillation: Secondary | ICD-10-CM | POA: Diagnosis not present

## 2018-03-28 DIAGNOSIS — Z96641 Presence of right artificial hip joint: Secondary | ICD-10-CM | POA: Diagnosis not present

## 2018-03-28 DIAGNOSIS — Z9889 Other specified postprocedural states: Secondary | ICD-10-CM | POA: Diagnosis not present

## 2018-03-28 DIAGNOSIS — Z8249 Family history of ischemic heart disease and other diseases of the circulatory system: Secondary | ICD-10-CM | POA: Insufficient documentation

## 2018-03-28 DIAGNOSIS — Z880 Allergy status to penicillin: Secondary | ICD-10-CM | POA: Insufficient documentation

## 2018-03-28 DIAGNOSIS — Z7901 Long term (current) use of anticoagulants: Secondary | ICD-10-CM | POA: Insufficient documentation

## 2018-03-28 DIAGNOSIS — Z87891 Personal history of nicotine dependence: Secondary | ICD-10-CM | POA: Diagnosis not present

## 2018-03-28 NOTE — Progress Notes (Signed)
Patient ID: Katrina Velez, female   DOB: [birth year], 84 y.o.   MRN: [id number]       Primary Care Physician: Kirt Boys, DO Referring Physician: Dr. Laqueta Jean Devoss is a 84 y.o. female with a h/o afib that was loaded on amiodarone and successfully cardioverted 4/13, but with ERAF. She did not see that much difference in SR and had some thyroid concerns  already on replacement, so decision was made to stop amiodarone and aim for rate control,HR has been staying below 100 at home. Her energy is good for her age and she is back to hanging clothes outside. No issues with anticoagulant.   Returns to afib clinic 8/23, after she has noticed at home some fluctuation in heart rate. Her daughter on taking her heart rate and Bp one day, noticed that hr heart rate was 48 bpm, although the pt did not feel bad. She talked to the on call provider and was advised to skip the next rate control drug. A few days later, the pt had a nonsustained episode of rvr at 150 bpm. She did feel lightheaded with that episode. On call provider was contacted and she was told to how to take extra rate control if needed. However after a few more minutes, her HR slowed down.  EKG showed afib with controlled afib. She is also upset because she feels that she needs more thyroid replacement. She has also felt better on a higher dose than what the labs suggest that she takes. Her synthroid was reduced several months back and the pt is c/o thinning hair, thinning nails, more fatigue, depression. She is planning to change to a new MD in the first of October to see if she can get straightened out with her thyroid.   Returns 9/6, holter monitor placed on previous visit and reviewed. Shows average HR around 107 bpm, high rate 160 bpm, minimum 40 bpm at 4:30 am. Pt was not aware of fast or slow heart rate.She could benefit from more rate control. She is still able to do her house work with minimal symptoms and feels that she has good rate  control.   F/u in the afib clinic 10/25 and has good rate control. Currently is on metoprolol ER 25 mg bid and cardizem 120 mg bid. Tried higher doses of BB but could not tolerate. She is tolerating this combination of rate control with HR in the 80's.   F/u in afib clinic,4/25, she is feeling well. Does not feel afib . No issues with shortness of breath or pedal edema. No bleeding issues with eliquis.  F/u in fib clinic, 09/20/17. She has now turned 89 and is doing well. She did lacerate her leg on a patio chair about 6 weeks  Ago and did need stitches. It is healing but a large dark scab is still present. Otherwise, no complaints.  F/u in afib clinic, 03/28/18. She is doing very well. No complaints. It took the area on her leg described above, 6 months to heal and eventually this was done with the help of the would clinic. She is now 89 and going strong. Her renal panel for June showed a creatinine of 0.84 and with her current weight, she still qualifies for 5 mg eliquis bid. No bleeding issues.  Today, she denies symptoms of palpitations, chest pain, shortness of breath, orthopnea, PND, lower extremity edema, dizziness, presyncope, syncope, or neurologic sequela. The patient is tolerating medications without difficulties and is otherwise without complaint today.  Past Medical History:  Diagnosis Date  . A-fib (HCC) 05/2015   HOSPITALIZED   . Arthritis   . Cataract   . Hypothyroidism   . Permanent atrial fibrillation   . Thyroid disease    Past Surgical History:  Procedure Laterality Date  . CARDIOVERSION N/A 06/08/2015   Procedure: CARDIOVERSION;  Surgeon: Jake Bathe, MD;  Location: [facility] ENDOSCOPY;  Service: Cardiovascular;  Laterality: N/A;  . CARDIOVERSION N/A 09/02/2015   Procedure: CARDIOVERSION;  Surgeon: Laqueta Linden, MD;  Location: [facility] ENDOSCOPY;  Service: Cardiovascular;  Laterality: N/A;  . CATARACT EXTRACTION, BILATERAL Bilateral 2010  . JOINT REPLACEMENT    . KNEE  SURGERY  2002  . right hip replacement    . RIGHT KNEE REPLACEMENT  2011  . TEE WITHOUT CARDIOVERSION N/A 06/08/2015   Procedure: TRANSESOPHAGEAL ECHOCARDIOGRAM (TEE);  Surgeon: Jake Bathe, MD;  Location: [facility] ENDOSCOPY;  Service: Cardiovascular;  Laterality: N/A;  . TOTAL HIP ARTHROPLASTY Right 2011  . TOTAL SHOULDER REPLACEMENT Right 2003  . TOTAL SHOULDER REPLACEMENT Left 2007  . WRIST SURGERY Bilateral 2008   both wrists    Current Outpatient Medications  Medication Sig Dispense Refill  . ascorbic acid (VITAMIN C) 500 MG tablet Take 1,000 mg by mouth daily.    Marland Kitchen CARDIZEM LA 120 MG 24 hr tablet TAKE 1 TABLET (120 MG TOTAL) BY MOUTH 2 (TWO) TIMES DAILY. 180 tablet 3  . cholecalciferol (VITAMIN D) 1000 units tablet Take 1,000 Units by mouth daily.    Marland Kitchen ELIQUIS 5 MG TABS tablet TAKE 1 TABLET (5 MG TOTAL) BY MOUTH 2 (TWO) TIMES DAILY. 180 tablet 1  . furosemide (LASIX) 20 MG tablet TAKE 1 TABLET DAILY AS NEEDED FOR WEIGHT GAIN >3LBS. 30 tablet 0  . metoprolol succinate (TOPROL-XL) 25 MG 24 hr tablet TAKE 1 TABLET BY MOUTH TWICE A DAY 180 tablet 2  . Multiple Vitamin (MULTIVITAMIN) capsule Take 1 capsule by mouth daily.    Marland Kitchen SYNTHROID 125 MCG tablet TAKE 1 TABLET (125 MCG TOTAL) BY MOUTH DAILY BEFORE BREAKFAST. 90 tablet 1  . traMADol (ULTRAM) 50 MG tablet Take 1 tablet (50 mg total) by mouth every 8 (eight) hours as needed. for pain 60 tablet 0  . vitamin E 400 UNIT capsule Take 400 Units by mouth daily.     No current facility-administered medications for this encounter.     Allergies  Allergen Reactions  . Banana Nausea Only    All banana products also  . Penicillins Hives    Social History   Socioeconomic History  . Marital status: Widowed    Spouse name: Not on file  . Number of children: Not on file  . Years of education: Not on file  . Highest education level: Not on file  Occupational History  . Not on file  Social Needs  . Financial resource strain: Not hard at  all  . Food insecurity:    Worry: Never true    Inability: Never true  . Transportation needs:    Medical: No    Non-medical: No  Tobacco Use  . Smoking status: Former Smoker    Packs/day: 1.00    Years: 65.00    Pack years: 65.00    Types: Cigarettes    Last attempt to quit: 05/28/2011    Years since quitting: 6.8  . Smokeless tobacco: Never Used  Substance and Sexual Activity  . Alcohol use: Yes    Alcohol/week: 1.0 - 2.0 standard drinks  Types: 1 - 2 Glasses of wine per week    Comment: occasional drink  . Drug use: No  . Sexual activity: Not on file  Lifestyle  . Physical activity:    Days per week: 0 days    Minutes per session: 0 min  . Stress: Only a little  Relationships  . Social connections:    Talks on phone: More than three times a week    Gets together: More than three times a week    Attends religious service: Never    Active member of club or organization: No    Attends meetings of clubs or organizations: Never    Relationship status: Widowed  . Intimate partner violence:    Fear of current or ex partner: No    Emotionally abused: No    Physically abused: No    Forced sexual activity: No  Other Topics Concern  . Not on file  Social History Narrative   DIET: NONE      DO YOU DRINK/EAT THINGS WITH CAFFEINE: YES, COFFEE AND TEA       MARITAL STATUS: WIDOWED      WHAT YEAR WERE YOU MARRIED:1950      DO YOU LIVE IN A HOUSE, APARTMENT, ASSISTED LIVING, CONDO TRAILER ETC.: HOUSE       IS IT ONE OR MORE STORIES: 2 STORIES      HOW MANY PERSONS LIVE IN YOUR HOME: 1      DO YOU HAVE PETS IN YOUR HOME: 1 CAT AND A PART TIME DOG       CURRENT OR PAST PROFESSION: CIVIL SERVANT      DO YOU EXERCISE: NO       WHAT TYPE AND HOW OFTEN:NO    Family History  Problem Relation Age of Onset  . Hypercalcemia Daughter   . Hypertension Daughter     ROS- All systems are reviewed and negative except as per the HPI above  Physical Exam: Vitals:    03/28/18 1046  BP: 118/74  Pulse: 78  Weight: 65.8 kg  Height: 5\' 3"  (1.6 m)    GEN- The patient is well appearing, alert and oriented x 3 today.   Head- normocephalic, atraumatic Eyes-  Sclera clear, conjunctiva pink Ears- hearing intact Oropharynx- clear Neck- supple, no JVP Lymph- no cervical lymphadenopathy Lungs- Clear to ausculation bilaterally, normal work of breathing Heart- Irregular rate and rhythm, no murmurs, rubs or gallops, PMI not laterally displaced GI- soft, NT, ND, + BS Extremities- no clubbing, cyanosis, or edema MS- no significant deformity or atrophy Skin- no rash or lesion Psych- euthymic mood, full affect Neuro- strength and sensation are intact  EKG-afib at 78 bpm, qrs int 70 ms, qtc 403 ms Epic records reviewed   Assessment and Plan: 1.Long standing permanent afib Rate controlled Appears to be doing well Continue metoprolol ER 25 mg bid and cardizem 120 mg bid, appears to have HR well controlled with these doses Continue eliquis 5 mg bid, Creatinine last checked 16/2019 and stable at 0.84, weight  over 60 kg, no bleeding issues  2. Hypothyroidism  Per pcp  F/u 6 months  Elvina Sidle. [facility] [address] [place], [place] [address] [phone]

## 2018-06-19 ENCOUNTER — Ambulatory Visit (INDEPENDENT_AMBULATORY_CARE_PROVIDER_SITE_OTHER): Payer: Medicare Other | Admitting: Nurse Practitioner

## 2018-06-19 ENCOUNTER — Encounter: Payer: Self-pay | Admitting: Nurse Practitioner

## 2018-06-19 VITALS — BP 116/72 | HR 73 | Temp 97.9°F | Resp 10 | Ht 63.0 in | Wt 145.0 lb

## 2018-06-19 DIAGNOSIS — E034 Atrophy of thyroid (acquired): Secondary | ICD-10-CM

## 2018-06-19 DIAGNOSIS — M48062 Spinal stenosis, lumbar region with neurogenic claudication: Secondary | ICD-10-CM

## 2018-06-19 DIAGNOSIS — M1991 Primary osteoarthritis, unspecified site: Secondary | ICD-10-CM | POA: Diagnosis not present

## 2018-06-19 DIAGNOSIS — I480 Paroxysmal atrial fibrillation: Secondary | ICD-10-CM

## 2018-06-19 LAB — CBC WITH DIFFERENTIAL/PLATELET
ABSOLUTE MONOCYTES: 653 {cells}/uL (ref 200–950)
Basophils Absolute: 61 cells/uL (ref 0–200)
Basophils Relative: 1 %
EOS ABS: 183 {cells}/uL (ref 15–500)
Eosinophils Relative: 3 %
HCT: 42.5 % (ref 35.0–45.0)
Hemoglobin: 13.9 g/dL (ref 11.7–15.5)
Lymphs Abs: 1171 cells/uL (ref 850–3900)
MCH: 29.3 pg (ref 27.0–33.0)
MCHC: 32.7 g/dL (ref 32.0–36.0)
MCV: 89.7 fL (ref 80.0–100.0)
MONOS PCT: 10.7 %
MPV: 10.9 fL (ref 7.5–12.5)
NEUTROS ABS: 4032 {cells}/uL (ref 1500–7800)
Neutrophils Relative %: 66.1 %
Platelets: 323 10*3/uL (ref 140–400)
RBC: 4.74 10*6/uL (ref 3.80–5.10)
RDW: 12.5 % (ref 11.0–15.0)
TOTAL LYMPHOCYTE: 19.2 %
WBC: 6.1 10*3/uL (ref 3.8–10.8)

## 2018-06-19 LAB — TSH: TSH: 0.4 m[IU]/L (ref 0.40–4.50)

## 2018-06-19 LAB — COMPLETE METABOLIC PANEL WITH GFR
AG RATIO: 1.7 (calc) (ref 1.0–2.5)
ALKALINE PHOSPHATASE (APISO): 72 U/L (ref 33–130)
ALT: 7 U/L (ref 6–29)
AST: 14 U/L (ref 10–35)
Albumin: 4.3 g/dL (ref 3.6–5.1)
BUN: 20 mg/dL (ref 7–25)
CO2: 29 mmol/L (ref 20–32)
Calcium: 10.5 mg/dL — ABNORMAL HIGH (ref 8.6–10.4)
Chloride: 103 mmol/L (ref 98–110)
Creat: 0.78 mg/dL (ref 0.60–0.88)
GFR, Est African American: 78 mL/min/{1.73_m2} (ref >=60)
GFR, Est Non African American: 67 mL/min/{1.73_m2} (ref >=60)
Globulin: 2.5 g/dL (calc) (ref 1.9–3.7)
Glucose, Bld: 108 mg/dL (ref 65–139)
POTASSIUM: 5.1 mmol/L (ref 3.5–5.3)
Sodium: 140 mmol/L (ref 135–146)
Total Bilirubin: 0.6 mg/dL (ref 0.2–1.2)
Total Protein: 6.8 g/dL (ref 6.1–8.1)

## 2018-06-19 NOTE — Progress Notes (Signed)
Careteam: Patient Care Team: Sharon Seller, NP as PCP - General (Geriatric Medicine) Sallye Lat, MD as Consulting Physician (Ophthalmology) Newman Nip, NP as Nurse Practitioner (Cardiology)  Advanced Directive information    Allergies  Allergen Reactions  . Banana Nausea Only    All banana products also  . Penicillins Hives    Chief Complaint  Patient presents with  . Medical Management of Chronic Issues    4 month follow-up      HPI: Patient is a 85 y.o. female seen in the office today for routine follow up. Former pt of Dr Assunta Found  Chronic back pain - controlled on prn tramadol. MRI L spine in 12/2016 revealed severe DDD with left L5-S1 foraminal L5 compression; moderate-severe stenosis at L3-L4. She continues to have numbness in LLE that she only feels at night while in the bed as "a tight band" around left foot.  Seasonal allergic rhinitis - uncontrolled. She takes prn OTC anthistamine and flonase. (+) rhinorrhea and post nasal drip  PAF - stable. rate controlled on cardizem LA and metoprolol succ. She takes eliquis for anticoagulation. Followed by cardiology and a fib clinic. She had a successful cardioversion in Arril 2017 but later reverted back to afib. She was hospitalized in Jan 2017 for afib w RVR, acute CHF and acute pulmonary edema. 2D echo showed EF 50-55%.   Hx CHF - takes lasix daily prn for leg swelling. Followed by cardiology. No recent weight gain  COPD/hx tobacco abuse - stable off meds. She no longer smokes. No exacerbations but will have shortness of breath occasionally.   Arthritis - pain stable. Her pain is improved on tramadol prn and also uses topical OTC lidocaine on wrists for carpal tunnel discomfort. She has taken ASA in the past but was told not to combine it with eliquis. She is intolerant of tylenol. Uses cane to ambulate  Hypothyroidism - controlled. takes brand name only synthroid daily and declines any med  changes related to TSH. TSH 0.24; free T4 1.6. She no longer sees Endo. She is taking med fasting and 1st thing in the AM. Does better when TSH is on the lower side. When thyroid is in the "normal" range she has lack of energy, hair falls out and has brittle nails. Reports she knows when her thyroid medication needs to be increased based on her symptoms not her labs. She "fired" her last endocrinologist  Review of Systems:  Review of Systems  Constitutional: Negative for chills, fever and weight loss.  HENT: Negative for tinnitus.   Respiratory: Negative for cough, sputum production and shortness of breath.   Cardiovascular: Negative for chest pain, palpitations and leg swelling.  Gastrointestinal: Negative for abdominal pain, constipation, diarrhea and heartburn.  Genitourinary: Negative for dysuria, frequency and urgency.  Musculoskeletal: Positive for back pain. Negative for falls, joint pain and myalgias.  Skin: Negative.   Neurological: Negative for dizziness and headaches.  Psychiatric/Behavioral: Negative for depression and memory loss. The patient does not have insomnia.     Past Medical History:  Diagnosis Date  . A-fib (HCC) 05/2015   HOSPITALIZED   . Arthritis   . Cataract   . Hypothyroidism   . Permanent atrial fibrillation   . Thyroid disease    Past Surgical History:  Procedure Laterality Date  . CARDIOVERSION N/A 06/08/2015   Procedure: CARDIOVERSION;  Surgeon: Jake Bathe, MD;  Location: [facility] ENDOSCOPY;  Service: Cardiovascular;  Laterality: N/A;  . CARDIOVERSION N/A 09/02/2015  Procedure: CARDIOVERSION;  Surgeon: Laqueta LindenSuresh A Koneswaran, MD;  [facility];  Service: Cardiovascular;  Laterality: N/A;  . CATARACT EXTRACTION, BILATERAL Bilateral 2010  . JOINT REPLACEMENT    . KNEE SURGERY  2002  . right hip replacement    . RIGHT KNEE REPLACEMENT  2011  . TEE WITHOUT CARDIOVERSION N/A 06/08/2015   Procedure: TRANSESOPHAGEAL ECHOCARDIOGRAM (TEE);  Surgeon: Jake BatheMark  C Skains, MD;  [facility];  Service: Cardiovascular;  Laterality: N/A;  . TOTAL HIP ARTHROPLASTY Right 2011  . TOTAL SHOULDER REPLACEMENT Right 2003  . TOTAL SHOULDER REPLACEMENT Left 2007  . WRIST SURGERY Bilateral 2008   both wrists   Social History:   reports that she quit smoking about 7 years ago. Her smoking use included cigarettes. She has a 65.00 pack-year smoking history. She has never used smokeless tobacco. She reports current alcohol use of about 1.0 - 2.0 standard drinks of alcohol per week. She reports that she does not use drugs.  Family History  Problem Relation Age of Onset  . Hypercalcemia Daughter   . Hypertension Daughter     Medications: Patient's Medications  New Prescriptions   No medications on file  Previous Medications   ASCORBIC ACID (VITAMIN C) 500 MG TABLET    Take 1,000 mg by mouth daily.   CARDIZEM LA 120 MG 24 HR TABLET    TAKE 1 TABLET (120 MG TOTAL) BY MOUTH 2 (TWO) TIMES DAILY.   CHOLECALCIFEROL (VITAMIN D) 1000 UNITS TABLET    Take 1,000 Units by mouth daily.   ELIQUIS 5 MG TABS TABLET    TAKE 1 TABLET (5 MG TOTAL) BY MOUTH 2 (TWO) TIMES DAILY.   FUROSEMIDE (LASIX) 20 MG TABLET    TAKE 1 TABLET DAILY AS NEEDED FOR WEIGHT GAIN >3LBS.   METOPROLOL SUCCINATE (TOPROL-XL) 25 MG 24 HR TABLET    TAKE 1 TABLET BY MOUTH TWICE A DAY   MULTIPLE VITAMIN (MULTIVITAMIN) CAPSULE    Take 1 capsule by mouth daily.   SYNTHROID 125 MCG TABLET    TAKE 1 TABLET (125 MCG TOTAL) BY MOUTH DAILY BEFORE BREAKFAST.   TRAMADOL (ULTRAM) 50 MG TABLET    Take 1 tablet (50 mg total) by mouth every 8 (eight) hours as needed. for pain   VITAMIN E 400 UNIT CAPSULE    Take 400 Units by mouth daily.  Modified Medications   No medications on file  Discontinued Medications   No medications on file     Physical Exam:  Vitals:   06/19/18 1023  BP: 116/72  Pulse: 73  Resp: 10  Temp: 97.9 F (36.6 C)  TempSrc: Oral  SpO2: 94%  Weight: 145 lb (65.8 kg)  Height:  5\' 3"  (1.6 m)   Body mass index is 25.69 kg/m.  Physical Exam Constitutional:      Appearance: Normal appearance. She is well-developed.  HENT:     Mouth/Throat:     Mouth: Mucous membranes are moist.     Pharynx: No oropharyngeal exudate.  Eyes:     General: No scleral icterus.    Pupils: Pupils are equal, round, and reactive to light.  Neck:     Musculoskeletal: Neck supple.     Thyroid: No thyromegaly.     Trachea: No tracheal deviation.  Cardiovascular:     Rate and Rhythm: Normal rate. Rhythm irregularly irregular.     Heart sounds: Murmur present. Systolic murmur present with a grade of 1/6. No friction rub. No gallop.   Pulmonary:  Effort: Pulmonary effort is normal. No respiratory distress.     Breath sounds: Normal breath sounds. No stridor. No wheezing or rales.  Abdominal:     General: Bowel sounds are normal. There is no distension.     Palpations: Abdomen is soft. Abdomen is not rigid. There is no hepatomegaly or mass.     Tenderness: There is no abdominal tenderness. There is no guarding or rebound.     Hernia: No hernia is present.  Skin:    General: Skin is warm and dry.     Findings: No rash.  Neurological:     Mental Status: She is alert and oriented to person, place, and time.     Gait: Gait abnormal.     Deep Tendon Reflexes: Reflexes are normal and symmetric.  Psychiatric:        Behavior: Behavior normal.        Thought Content: Thought content normal.        Judgment: Judgment normal.     Labs reviewed: Basic Metabolic Panel: Recent Labs    08/14/17 1042 10/01/17 1010 11/12/17 1039 11/14/17 1132  NA 139  --   --  140  K 4.8  --   --  4.8  CL 100  --   --  102  CO2 33*  --   --  31  GLUCOSE 100  --   --  92  BUN 16  --   --  15  CREATININE 0.82  --   --  0.84  CALCIUM 10.1  --   --  10.1  TSH 0.08* 0.22* 0.24*  --    Liver Function Tests: Recent Labs    08/14/17 1042  ALT 9   No results for input(s): LIPASE, AMYLASE in the  last 8760 hours. No results for input(s): AMMONIA in the last 8760 hours. CBC: Recent Labs    08/14/17 1042  WBC 6.9  NEUTROABS 4,816  HGB 13.3  HCT 39.8  MCV 88.6  PLT 337   Lipid Panel: No results for input(s): CHOL, HDL, LDLCALC, TRIG, CHOLHDL, LDLDIRECT in the last 8760 hours. TSH: Recent Labs    08/14/17 1042 10/01/17 1010 11/12/17 1039  TSH 0.08* 0.22* 0.24*   A1C: No results found for: HGBA1C   Assessment/Plan 1. PAF (paroxysmal atrial fibrillation) (HCC) Rate controlled at this time on Cardizem and metoprolol. Continues on eliquis for anticoagulation.  - CBC with Differential/Platelets  2. Hypothyroidism due to acquired atrophy of thyroid -feels like she need MORE medication but recent TSH low at 0.24, continues on synthroid 125 mcg at this time. Will follow up lab today - TSH  3. Spinal stenosis of lumbar region with neurogenic claudication Stable, continues on tramadol as needed for pain.   4. Primary osteoarthritis, unspecified site Ongoing, has benefit with tramadol. No side effects noted.  - COMPLETE METABOLIC PANEL WITH GFR  Next appt: 6 months, sooner if needed  Von Quintanar K. Biagio BorgEubanks, AGNP  [facility] [phone]

## 2018-06-20 ENCOUNTER — Other Ambulatory Visit: Payer: Self-pay | Admitting: Nurse Practitioner

## 2018-08-09 ENCOUNTER — Other Ambulatory Visit: Payer: Self-pay | Admitting: *Deleted

## 2018-08-09 DIAGNOSIS — M48062 Spinal stenosis, lumbar region with neurogenic claudication: Secondary | ICD-10-CM

## 2018-08-09 DIAGNOSIS — M1991 Primary osteoarthritis, unspecified site: Secondary | ICD-10-CM

## 2018-08-09 MED ORDER — TRAMADOL HCL 50 MG PO TABS: 50.0000 mg | ORAL_TABLET | Freq: Three times a day (TID) | ORAL | 0 refills | Status: DC | PRN

## 2018-08-09 NOTE — Telephone Encounter (Signed)
Last filled 08/14/2017 Database checked and verified

## 2018-08-24 ENCOUNTER — Other Ambulatory Visit (HOSPITAL_COMMUNITY): Payer: Self-pay | Admitting: Nurse Practitioner

## 2018-08-24 DIAGNOSIS — I4821 Permanent atrial fibrillation: Secondary | ICD-10-CM

## 2018-08-27 ENCOUNTER — Other Ambulatory Visit: Payer: Self-pay

## 2018-08-27 MED ORDER — SYNTHROID 125 MCG PO TABS: ORAL_TABLET | ORAL | 1 refills | Status: DC

## 2018-09-14 ENCOUNTER — Other Ambulatory Visit (HOSPITAL_COMMUNITY): Payer: Self-pay | Admitting: Nurse Practitioner

## 2018-09-26 ENCOUNTER — Ambulatory Visit (HOSPITAL_COMMUNITY)
Admission: RE | Admit: 2018-09-26 | Discharge: 2018-09-26 | Disposition: A | Discharge status: Home / Self Care | Payer: Medicare Other | Source: Ambulatory Visit | Attending: Nurse Practitioner | Admitting: Nurse Practitioner

## 2018-09-26 ENCOUNTER — Other Ambulatory Visit: Payer: Self-pay

## 2018-09-26 DIAGNOSIS — I4821 Permanent atrial fibrillation: Secondary | ICD-10-CM

## 2018-09-26 NOTE — Progress Notes (Signed)
Electrophysiology TeleHealth Note   Due to national recommendations of social distancing due to COVID 19, Audio/video telehealth visit is felt to be most appropriate for this patient at this time.  See MyChart message/consent below from today for patient consent regarding telehealth for the Atrial Fibrillation Clinic.    Date:  09/26/2018   ID:  Katrina Velez, DOB 09/22/1925, MRN [id number]  Location: home  Provider location: [address] [place], [place] [address] Evaluation Performed: Follow up  PCP:  Sharon SellerEubanks, Jessica K, NP  Primary Cardiologist:  Primary Electrophysiologist: Afib clinic, Rudi Cocoonna Illana Nolting, NP  CC: Fu permanent afib   History of Present Illness: Katrina Peachlice Borg is a 85 y.o. female who presents via audio  conferencing for a telehealth visit today.   Video was attempted but failed to connect on pt's side.  Patent reports that she is now 85 yo  as of March and feels that she is doing great. She still lives alone and does her housework and yard work. She has no complaints. No LEE. Afib is well controlled. No bleeding issues with eliquis. She has a daughter locally that is supportive in her care.   Today, she denies symptoms of palpitations, chest pain, shortness of breath, orthopnea, PND, lower extremity edema, claudication, dizziness, presyncope, syncope, bleeding, or neurologic sequela. The patient is tolerating medications without difficulties and is otherwise without complaint today.   she denies symptoms of cough, fevers, chills, or new SOB worrisome for COVID 19.     Atrial Fibrillation Risk Factors:  she does not have symptoms or diagnosis of sleep apnea. she does not have a history of rheumatic fever. she does not have a history of alcohol use. The patient does not have a history of early familial atrial fibrillation or other arrhythmias.  she has a BMI of There is no height or weight on file to calculate BMI.. There were no vitals filed for this  visit.  Past Medical History:  Diagnosis Date   A-fib (HCC) 05/2015   HOSPITALIZED    Arthritis    Cataract    Hypothyroidism    Permanent atrial fibrillation    Thyroid disease    Past Surgical History:  Procedure Laterality Date   CARDIOVERSION N/A 06/08/2015   Procedure: CARDIOVERSION;  Surgeon: Jake BatheMark C Skains, MD;  [facility];  Service: Cardiovascular;  Laterality: N/A;   CARDIOVERSION N/A 09/02/2015   Procedure: CARDIOVERSION;  Surgeon: Laqueta LindenSuresh A Koneswaran, MD;  Location: [facility] ENDOSCOPY;  Service: Cardiovascular;  Laterality: N/A;   CATARACT EXTRACTION, BILATERAL Bilateral 2010   JOINT REPLACEMENT     KNEE SURGERY  2002   right hip replacement     RIGHT KNEE REPLACEMENT  2011   TEE WITHOUT CARDIOVERSION N/A 06/08/2015   Procedure: TRANSESOPHAGEAL ECHOCARDIOGRAM (TEE);  Surgeon: Jake BatheMark C Skains, MD;  [facility];  Service: Cardiovascular;  Laterality: N/A;   TOTAL HIP ARTHROPLASTY Right 2011   TOTAL SHOULDER REPLACEMENT Right 2003   TOTAL SHOULDER REPLACEMENT Left 2007   WRIST SURGERY Bilateral 2008   both wrists     Current Outpatient Medications  Medication Sig Dispense Refill   ascorbic acid (VITAMIN C) 500 MG tablet Take 1,000 mg by mouth daily.     CARDIZEM LA 120 MG 24 hr tablet TAKE 1 TABLET (120 MG TOTAL) BY MOUTH 2 (TWO) TIMES DAILY. 180 tablet 3   cholecalciferol (VITAMIN D) 1000 units tablet Take 1,000 Units by mouth daily.     ELIQUIS 5 MG TABS tablet  TAKE 1 TABLET (5 MG TOTAL) BY MOUTH 2 (TWO) TIMES DAILY. 180 tablet 1   furosemide (LASIX) 20 MG tablet TAKE 1 TABLET DAILY AS NEEDED FOR WEIGHT GAIN >3LBS. 30 tablet 0   metoprolol succinate (TOPROL-XL) 25 MG 24 hr tablet TAKE 1 TABLET BY MOUTH TWICE A DAY 180 tablet 2   Multiple Vitamin (MULTIVITAMIN) capsule Take 1 capsule by mouth daily.     SYNTHROID 125 MCG tablet TAKE 1 TABLET (125 MCG TOTAL) BY MOUTH DAILY BEFORE BREAKFAST. 90 tablet 1   traMADol (ULTRAM) 50 MG  tablet Take 1 tablet (50 mg total) by mouth every 8 (eight) hours as needed. for pain 30 tablet 0   vitamin E 400 UNIT capsule Take 400 Units by mouth daily.     No current facility-administered medications for this encounter.     Allergies:   Banana and Penicillins   Social History:  The patient  reports that she quit smoking about 7 years ago. Her smoking use included cigarettes. She has a 65.00 pack-year smoking history. She has never used smokeless tobacco. She reports current alcohol use of about 1.0 - 2.0 standard drinks of alcohol per week. She reports that she does not use drugs.   Family History:  The patient's  family history includes Hypercalcemia in her daughter; Hypertension in her daughter.    ROS:  Please see the history of present illness.   All other systems are personally reviewed and negative.   Exam: NA telephone visit, normal speaking ability with no shortness of breath noted  Recent Labs: 06/19/2018: ALT 7; BUN 20; Creat 0.78; Hemoglobin 13.9; Platelets 323; Potassium 5.1; Sodium 140; TSH 0.40  personally reviewed    Other studies personally reviewed: Epic records reviewed      ASSESSMENT AND PLAN:  1.  Permanent atrial fibrillation Pt is tolerating afib very well.  Doing great for her age No change in rate control BP this am 124/64 and HR is 80 and mildly irregular. Continue eliquis 5 mg bid This patients CHA2DS2-VASc Score and unadjusted Ischemic Stroke Rate (% per year) is equal to 4.8 % stroke rate/year from a score of 4  Above score calculated as 1 point each if present [CHF, HTN, DM, Vascular=MI/PAD/Aortic Plaque, Age if 65-74, or Female] Above score calculated as 2 points each if present [Age > 75, or Stroke/TIA/TE]    COVID screen The patient does not have any symptoms that suggest any further testing/ screening at this time.  Social distancing reinforced today.   Follow-up: 6 months  Current medicines are reviewed at length with the  patient today.   The patient does not have concerns regarding her medicines.  The following changes were made today:  none  Labs/ tests ordered today include: none No orders of the defined types were placed in this encounter.   Patient Risk:  after full review of this patients clinical status, I feel that they are at  moderate risk at this time.   Today, I have spent  minutes with the patient with telehealth technology discussing afib/covid    Signed, Rudi Coco NP  09/26/2018 11:22 AM  Afib Clinic [facility] [address] [place], [place] [address] [phone]   I hereby voluntarily request, consent and authorize the Atrial Fibrillation Clinic and its employed or contracted physicians, physician assistants, nurse practitioners or other licensed health care professionals (the Practitioner), to provide me with telemedicine health care services (the "Services") as deemed necessary by the treating Practitioner. I  acknowledge and consent to receive the Services by the Practitioner via telemedicine. I understand that the telemedicine visit will involve communicating with the Practitioner through live audiovisual communication technology and the disclosure of certain medical information by electronic transmission. I acknowledge that I have been given the opportunity to request an in-person assessment or other available alternative prior to the telemedicine visit and am voluntarily participating in the telemedicine visit.   I understand that I have the right to withhold or withdraw my consent to the use of telemedicine in the course of my care at any time, without affecting my right to future care or treatment, and that the Practitioner or I may terminate the telemedicine visit at any time. I understand that I have the right to inspect all information obtained and/or recorded in the course of the telemedicine visit and may receive copies of available information for a reasonable fee.   I understand that some of the potential risks of receiving the Services via telemedicine include:   Delay or interruption in medical evaluation due to technological equipment failure or disruption;  Information transmitted may not be sufficient (e.g. poor resolution of images) to allow for appropriate medical decision making by the Practitioner; and/or  In rare instances, security protocols could fail, causing a breach of personal health information.   Furthermore, I acknowledge that it is my responsibility to provide information about my medical history, conditions and care that is complete and accurate to the best of my ability. I acknowledge that Practitioner's advice, recommendations, and/or decision may be based on factors not within their control, such as incomplete or inaccurate data provided by me or distortions of diagnostic images or specimens that may result from electronic transmissions. I understand that the practice of medicine is not an exact science and that Practitioner makes no warranties or guarantees regarding treatment outcomes. I acknowledge that I will receive a copy of this consent concurrently upon execution via email to the email address I last provided but may also request a printed copy by calling the office of the Atrial Fibrillation Clinic.  I understand that my insurance will be billed for this visit.   I have read or had this consent read to me.  I understand the contents of this consent, which adequately explains the benefits and risks of the Services being provided via telemedicine.  I have been provided ample opportunity to ask questions regarding this consent and the Services and have had my questions answered to my satisfaction.  I give my informed consent for the services to be provided through the use of telemedicine in my medical care  By participating in this telemedicine visit I agree to the above.

## 2018-10-04 ENCOUNTER — Encounter: Payer: Self-pay | Admitting: Family

## 2018-10-04 ENCOUNTER — Other Ambulatory Visit: Payer: Self-pay

## 2018-10-04 ENCOUNTER — Ambulatory Visit (INDEPENDENT_AMBULATORY_CARE_PROVIDER_SITE_OTHER): Payer: Medicare Other | Admitting: Family

## 2018-10-04 ENCOUNTER — Ambulatory Visit: Payer: No Typology Code available for payment source

## 2018-10-04 DIAGNOSIS — Z0000 Encounter for general adult medical examination without abnormal findings: Secondary | ICD-10-CM | POA: Diagnosis not present

## 2018-10-04 NOTE — Progress Notes (Signed)
Subjective:   Katrina Velez is a 85 y.o. female who presents for Medicare Annual (Subsequent) preventive examination.  Review of Systems:   Cardiac Risk Factors include: advanced age (>36men, >3 women);smoking/ tobacco exposure;sedentary lifestyle     Objective:     Vitals: There were no vitals taken for this visit.  There is no height or weight on file to calculate BMI.  Advanced Directives 10/04/2018 11/14/2017 10/01/2017 08/31/2017 08/04/2017 05/08/2017 02/07/2017  Does Patient Have a Medical Advance Directive? Yes Yes Yes Yes No No No  Type of Advance Directive Living will Healthcare Power of [employer] of [place];Living will Healthcare Power of [place];Living will - - -  Does patient want to make changes to medical advance directive? No - Patient declined No - Patient declined No - Patient declined No - Patient declined - - Yes (ED - Information included in AVS)  Copy of Healthcare Power of Attorney in Chart? - Yes Yes Yes - - -  Would patient like information on creating a medical advance directive? - - - - - Yes (ED - Information included in AVS) -  Pre-existing out of facility DNR order (yellow form or pink MOST form) - - - - - - -    Tobacco Social History   Tobacco Use  Smoking Status Former Smoker  . Packs/day: 1.00  . Years: 65.00  . Pack years: 65.00  . Types: Cigarettes  . Last attempt to quit: 05/28/2011  . Years since quitting: 7.3  Smokeless Tobacco Never Used     Counseling given: Not Answered   Clinical Intake:  Pre-visit preparation completed: No  Pain : 0-10 Pain Score: 5  Pain Type: Chronic pain Pain Location: Back Pain Orientation: Lower Pain Radiating Towards: no  Pain Descriptors / Indicators: Aching Pain Onset: Other (comment)(several years) Pain Frequency: Intermittent Pain Relieving Factors: pain medication  Effect of Pain on Daily Activities: yes  Pain Relieving Factors: pain medication   Nutritional Risks: None  Diabetes: No  How often do you need to have someone help you when you read instructions, pamphlets, or other written materials from your doctor or pharmacy?: 3 - Sometimes What is the last grade level you completed in school?: associate degree  Interpreter Needed?: No  Information entered by ::   FNP-C   Past Medical History:  Diagnosis Date  . A-fib (HCC) 05/2015   HOSPITALIZED   . Arthritis   . Cataract   . Hypothyroidism   . Permanent atrial fibrillation   . Thyroid disease    Past Surgical History:  Procedure Laterality Date  . CARDIOVERSION N/A 06/08/2015   Procedure: CARDIOVERSION;  Surgeon: Jake Bathe, MD;  Location: [facility] ENDOSCOPY;  Service: Cardiovascular;  Laterality: N/A;  . CARDIOVERSION N/A 09/02/2015   Procedure: CARDIOVERSION;  Surgeon: Laqueta Linden, MD;  Location: [facility] ENDOSCOPY;  Service: Cardiovascular;  Laterality: N/A;  . CATARACT EXTRACTION, BILATERAL Bilateral 2010  . JOINT REPLACEMENT    . KNEE SURGERY  2002  . right hip replacement    . RIGHT KNEE REPLACEMENT  2011  . TEE WITHOUT CARDIOVERSION N/A 06/08/2015   Procedure: TRANSESOPHAGEAL ECHOCARDIOGRAM (TEE);  Surgeon: Jake Bathe, MD;  Location: [facility] ENDOSCOPY;  Service: Cardiovascular;  Laterality: N/A;  . TOTAL HIP ARTHROPLASTY Right 2011  . TOTAL SHOULDER REPLACEMENT Right 2003  . TOTAL SHOULDER REPLACEMENT Left 2007  . WRIST SURGERY Bilateral 2008   both wrists   Family History  Problem Relation Age of Onset  . Hypercalcemia Daughter   .  Hypertension Daughter    Social History   Socioeconomic History  . Marital status: Widowed    Spouse name: Not on file  . Number of children: Not on file  . Years of education: Not on file  . Highest education level: Not on file  Occupational History  . Not on file  Social Needs  . Financial resource strain: Not hard at all  . Food insecurity:    Worry: Never true    Inability: Never true  . Transportation needs:    Medical: No     Non-medical: No  Tobacco Use  . Smoking status: Former Smoker    Packs/day: 1.00    Years: 65.00    Pack years: 65.00    Types: Cigarettes    Last attempt to quit: 05/28/2011    Years since quitting: 7.3  . Smokeless tobacco: Never Used  Substance and Sexual Activity  . Alcohol use: Yes    Alcohol/week: 1.0 - 2.0 standard drinks    Types: 1 - 2 Glasses of wine per week    Comment: occasional drink  . Drug use: No  . Sexual activity: Not on file  Lifestyle  . Physical activity:    Days per week: 0 days    Minutes per session: 0 min  . Stress: Only a little  Relationships  . Social connections:    Talks on phone: More than three times a week    Gets together: More than three times a week    Attends religious service: Never    Active member of club or organization: No    Attends meetings of clubs or organizations: Never    Relationship status: Widowed  Other Topics Concern  . Not on file  Social History Narrative   DIET: NONE      DO YOU DRINK/EAT THINGS WITH CAFFEINE: YES, COFFEE AND TEA       MARITAL STATUS: WIDOWED      WHAT YEAR WERE YOU MARRIED:1950      DO YOU LIVE IN A HOUSE, APARTMENT, ASSISTED LIVING, CONDO TRAILER ETC.: HOUSE       IS IT ONE OR MORE STORIES: 2 STORIES      HOW MANY PERSONS LIVE IN YOUR HOME: 1      DO YOU HAVE PETS IN YOUR HOME: 1 CAT AND A PART TIME DOG       CURRENT OR PAST PROFESSION: CIVIL SERVANT      DO YOU EXERCISE: NO       WHAT TYPE AND HOW OFTEN:NO    Outpatient Encounter Medications as of 10/04/2018  Medication Sig  . ascorbic acid (VITAMIN C) 500 MG tablet Take 1,000 mg by mouth daily.  Marland Kitchen. CARDIZEM LA 120 MG 24 hr tablet TAKE 1 TABLET (120 MG TOTAL) BY MOUTH 2 (TWO) TIMES DAILY.  . cholecalciferol (VITAMIN D) 1000 units tablet Take 1,000 Units by mouth daily.  Marland Kitchen. ELIQUIS 5 MG TABS tablet TAKE 1 TABLET (5 MG TOTAL) BY MOUTH 2 (TWO) TIMES DAILY.  . furosemide (LASIX) 20 MG tablet TAKE 1 TABLET DAILY AS NEEDED FOR WEIGHT GAIN  >3LBS.  Marland Kitchen. metoprolol succinate (TOPROL-XL) 25 MG 24 hr tablet TAKE 1 TABLET BY MOUTH TWICE A DAY  . Multiple Vitamin (MULTIVITAMIN) capsule Take 1 capsule by mouth daily.  Marland Kitchen. SYNTHROID 125 MCG tablet TAKE 1 TABLET (125 MCG TOTAL) BY MOUTH DAILY BEFORE BREAKFAST.  Marland Kitchen. traMADol (ULTRAM) 50 MG tablet Take 1 tablet (50 mg total) by mouth every 8 (eight)  hours as needed. for pain  . vitamin E 400 UNIT capsule Take 400 Units by mouth daily.   No facility-administered encounter medications on file as of 10/04/2018.     Activities of Daily Living In your present state of health, do you have any difficulty performing the following activities: 10/04/2018  Hearing? Y  Vision? N  Difficulty concentrating or making decisions? N  Walking or climbing stairs? Y  Dressing or bathing? N  Doing errands, shopping? Y  Comment family assist   Preparing Food and eating ? N  Comment makes breakfast and lunch daughter supper   Using the Toilet? N  In the past six months, have you accidently leaked urine? Y  Do you have problems with loss of bowel control? N  Managing your Medications? N  Managing your Finances? N  Housekeeping or managing your Housekeeping? N  Some recent data might be hidden    Patient Care Team: Sharon Seller, NP as PCP - General (Geriatric Medicine) Sallye Lat, MD as Consulting Physician (Ophthalmology) Newman Nip, NP as Nurse Practitioner (Cardiology)    Assessment:   This is a routine wellness examination for Sokhna.  Exercise Activities and Dietary recommendations Current Exercise Habits: The patient does not participate in regular exercise at present  Goals    . Increase water intake     Starting today I will increase my water intake slowly day by day.        Fall Risk Fall Risk  10/04/2018 06/19/2018 02/15/2018 01/07/2018 11/14/2017  Falls in the past year? 0 0 No No No  Number falls in past yr: 0 0 - - -  Injury with Fall? 0 0 - - -   Is the patient's  home free of loose throw rugs in walkways, pet beds, electrical cords, etc?   yes      Grab bars in the bathroom? no      Handrails on the stairs?   yes      Adequate lighting?   yes  Depression Screen PHQ 2/9 Scores 10/04/2018 02/15/2018 10/01/2017 05/08/2017  PHQ - 2 Score 0 0 1 0  PHQ- 9 Score - - - -     Cognitive Function MMSE - Mini Mental State Exam 10/01/2017 09/29/2016  Orientation to time 5 5  Orientation to Place 5 5  Registration 3 3  Attention/ Calculation 5 5  Recall 2 2  Language- name 2 objects 2 2  Language- repeat 1 1  Language- follow 3 step command 3 3  Language- read & follow direction 1 1  Write a sentence 1 1  Copy design 1 1  Total score 29 29     6CIT Screen 10/04/2018  What Year? 0 points  What month? 0 points  What time? 0 points  Count back from 20 0 points  Months in reverse 0 points  Repeat phrase 2 points  Total Score 2    Immunization History  Administered Date(s) Administered  . Influenza Split 03/25/2012  . Influenza, High Dose Seasonal PF 03/28/2018  . Influenza,inj,Quad PF,6+ Mos 02/24/2013, 03/15/2015, 02/25/2016  . Influenza-Unspecified 05/01/2014, 03/19/2017  . Pneumococcal Conjugate-13 05/27/2014  . Pneumococcal Polysaccharide-23 02/25/2016  . Tdap 10/10/2016  . Zoster 05/22/2001  . Zoster Recombinat (Shingrix) 08/31/2017, 11/07/2017    Qualifies for Shingles Vaccine? Up to date   Screening Tests Health Maintenance  Topic Date Due  . INFLUENZA VACCINE  12/21/2018  . TETANUS/TDAP  10/11/2026  . DEXA SCAN  Completed  .  PNA vac Low Risk Adult  Completed    Cancer Screenings: Lung: Low Dose CT Chest recommended if Age 47-80 years, 30 pack-year currently smoking OR have quit w/in 15years. Patient does not qualify. Breast:  Up to date on Mammogram? N/A   Up to date of Bone Density/Dexa? Yes Colorectal: N/A   Additional Screenings: Hepatitis C Screening: low risk   Plan:   I have personally reviewed and noted the  following in the patient's chart:   . Medical and social history . Use of alcohol, tobacco or illicit drugs  . Current medications and supplements . Functional ability and status . Nutritional status . Physical activity . Advanced directives . List of other physicians . Hospitalizations, surgeries, and ER visits in previous 12 months . Vitals . Screenings to include cognitive, depression, and falls . Referrals and appointments  In addition, I have reviewed and discussed with patient certain preventive protocols, quality metrics, and best practice recommendations. A written personalized care plan for preventive services as well as general preventive health recommendations were provided to patient.    Caesar Bookman, NP  10/04/2018

## 2018-10-04 NOTE — Progress Notes (Signed)
   This service is provided via telemedicine  No vital signs collected/recorded due to the encounter was a telemedicine visit.   Location of patient (ex: home, work):  Home   Patient consents to a telephone visit:  Yes   Location of the provider (ex: office, home):  Office   Name of any referring provider:  Abbey Chatters NP   Names of all persons participating in the telemedicine service and their role in the encounter:  Asher Muir CMA, Dinah Ngetich NP, and Glorious Peach   Time spent on call:  Asher Muir CMA, spent  12 Minutes on phone with patient

## 2018-10-04 NOTE — Patient Instructions (Signed)
Katrina Velez , Thank you for taking time to come for your Medicare Wellness Visit. I appreciate your ongoing commitment to your health goals. Please review the following plan we discussed and let me know if I can assist you in the future.   Screening recommendations/referrals: Colonoscopy N/A  Mammogram N/A  Bone Density up to date  Recommended yearly ophthalmology/optometry visit for glaucoma screening and checkup Recommended yearly dental visit for hygiene and checkup  Vaccinations: Influenza vaccine: Up to date  Pneumococcal vaccine: Up to date  Tdap vaccine ;Up to date due 2028  Shingles vaccine :Up to date    Advanced directives: Yes   Conditions/risks identified: advanced aged female >65 years,hx smoking,sedentary lifestyle   Next appointment: 1 year    Preventive Care 83 Years and Older, Female Preventive care refers to lifestyle choices and visits with your health care provider that can promote health and wellness. What does preventive care include?  A yearly physical exam. This is also called an annual well check.  Dental exams once or twice a year.  Routine eye exams. Ask your health care provider how often you should have your eyes checked.  Personal lifestyle choices, including:  Daily care of your teeth and gums.  Regular physical activity.  Eating a healthy diet.  Avoiding tobacco and drug use.  Limiting alcohol use.  Practicing safe sex.  Taking low-dose aspirin every day.  Taking vitamin and mineral supplements as recommended by your health care provider. What happens during an annual well check? The services and screenings done by your health care provider during your annual well check will depend on your age, overall health, lifestyle risk factors, and family history of disease. Counseling  Your health care provider may ask you questions about your:  Alcohol use.  Tobacco use.  Drug use.  Emotional well-being.  Home and relationship  well-being.  Sexual activity.  Eating habits.  History of falls.  Memory and ability to understand (cognition).  Work and work Astronomer.  Reproductive health. Screening  You may have the following tests or measurements:  Height, weight, and BMI.  Blood pressure.  Lipid and cholesterol levels. These may be checked every 5 years, or more frequently if you are over 70 years old.  Skin check.  Lung cancer screening. You may have this screening every year starting at age 83 if you have a 30-pack-year history of smoking and currently smoke or have quit within the past 15 years.  Fecal occult blood test (FOBT) of the stool. You may have this test every year starting at age 83.  Flexible sigmoidoscopy or colonoscopy. You may have a sigmoidoscopy every 5 years or a colonoscopy every 10 years starting at age 83.  Hepatitis C blood test.  Hepatitis B blood test.  Sexually transmitted disease (STD) testing.  Diabetes screening. This is done by checking your blood sugar (glucose) after you have not eaten for a while (fasting). You may have this done every 1-3 years.  Bone density scan. This is done to screen for osteoporosis. You may have this done starting at age 83.  Mammogram. This may be done every 1-2 years. Talk to your health care provider about how often you should have regular mammograms. Talk with your health care provider about your test results, treatment options, and if necessary, the need for more tests. Vaccines  Your health care provider may recommend certain vaccines, such as:  Influenza vaccine. This is recommended every year.  Tetanus, diphtheria, and acellular pertussis (Tdap,  Td) vaccine. You may need a Td booster every 10 years.  Zoster vaccine. You may need this after age 83.  Pneumococcal 13-valent conjugate (PCV13) vaccine. One dose is recommended after age 83.  Pneumococcal polysaccharide (PPSV23) vaccine. One dose is recommended after age 83.  Talk to your health care provider about which screenings and vaccines you need and how often you need them. This information is not intended to replace advice given to you by your health care provider. Make sure you discuss any questions you have with your health care provider. Document Released: 06/04/2015 Document Revised: 01/26/2016 Document Reviewed: 03/09/2015 Elsevier Interactive Patient Education  2017 [employer] Prevention in the Home Falls can cause injuries. They can happen to people of all ages. There are many things you can do to make your home safe and to help prevent falls. What can I do on the outside of my home?  Regularly fix the edges of walkways and driveways and fix any cracks.  Remove anything that might make you trip as you walk through a door, such as a raised step or threshold.  Trim any bushes or trees on the path to your home.  Use bright outdoor lighting.  Clear any walking paths of anything that might make someone trip, such as rocks or tools.  Regularly check to see if handrails are loose or broken. Make sure that both sides of any steps have handrails.  Any raised decks and porches should have guardrails on the edges.  Have any leaves, snow, or ice cleared regularly.  Use sand or salt on walking paths during winter.  Clean up any spills in your garage right away. This includes oil or grease spills. What can I do in the bathroom?  Use night lights.  Install grab bars by the toilet and in the tub and shower. Do not use towel bars as grab bars.  Use non-skid mats or decals in the tub or shower.  If you need to sit down in the shower, use a plastic, non-slip stool.  Keep the floor dry. Clean up any water that spills on the floor as soon as it happens.  Remove soap buildup in the tub or shower regularly.  Attach bath mats securely with double-sided non-slip rug tape.  Do not have throw rugs and other things on the floor that can make you  trip. What can I do in the bedroom?  Use night lights.  Make sure that you have a light by your bed that is easy to reach.  Do not use any sheets or blankets that are too big for your bed. They should not hang down onto the floor.  Have a firm chair that has side arms. You can use this for support while you get dressed.  Do not have throw rugs and other things on the floor that can make you trip. What can I do in the kitchen?  Clean up any spills right away.  Avoid walking on wet floors.  Keep items that you use a lot in easy-to-reach places.  If you need to reach something above you, use a strong step stool that has a grab bar.  Keep electrical cords out of the way.  Do not use floor polish or wax that makes floors slippery. If you must use wax, use non-skid floor wax.  Do not have throw rugs and other things on the floor that can make you trip. What can I do with my stairs?  Do not  leave any items on the stairs.  Make sure that there are handrails on both sides of the stairs and use them. Fix handrails that are broken or loose. Make sure that handrails are as long as the stairways.  Check any carpeting to make sure that it is firmly attached to the stairs. Fix any carpet that is loose or worn.  Avoid having throw rugs at the top or bottom of the stairs. If you do have throw rugs, attach them to the floor with carpet tape.  Make sure that you have a light switch at the top of the stairs and the bottom of the stairs. If you do not have them, ask someone to add them for you. What else can I do to help prevent falls?  Wear shoes that:  Do not have high heels.  Have rubber bottoms.  Are comfortable and fit you well.  Are closed at the toe. Do not wear sandals.  If you use a stepladder:  Make sure that it is fully opened. Do not climb a closed stepladder.  Make sure that both sides of the stepladder are locked into place.  Ask someone to hold it for you, if  possible.  Clearly mark and make sure that you can see:  Any grab bars or handrails.  First and last steps.  Where the edge of each step is.  Use tools that help you move around (mobility aids) if they are needed. These include:  Canes.  Walkers.  Scooters.  Crutches.  Turn on the lights when you go into a dark area. Replace any light bulbs as soon as they burn out.  Set up your furniture so you have a clear path. Avoid moving your furniture around.  If any of your floors are uneven, fix them.  If there are any pets around you, be aware of where they are.  Review your medicines with your doctor. Some medicines can make you feel dizzy. This can increase your chance of falling. Ask your doctor what other things that you can do to help prevent falls. This information is not intended to replace advice given to you by your health care provider. Make sure you discuss any questions you have with your health care provider. Document Released: 03/04/2009 Document Revised: 10/14/2015 Document Reviewed: 06/12/2014 Elsevier Interactive Patient Education  2017 [employer].

## 2018-11-06 ENCOUNTER — Other Ambulatory Visit (HOSPITAL_COMMUNITY): Payer: Self-pay | Admitting: Nurse Practitioner

## 2018-11-07 ENCOUNTER — Other Ambulatory Visit (HOSPITAL_COMMUNITY): Payer: Self-pay | Admitting: Nurse Practitioner

## 2018-11-07 DIAGNOSIS — I4821 Permanent atrial fibrillation: Secondary | ICD-10-CM

## 2018-12-22 ENCOUNTER — Emergency Department (HOSPITAL_COMMUNITY)
Admission: EM | Admit: 2018-12-22 | Discharge: 2018-12-22 | Disposition: A | Discharge status: Home / Self Care | Payer: Medicare Other | Attending: Emergency Medicine | Admitting: Emergency Medicine

## 2018-12-22 ENCOUNTER — Emergency Department (HOSPITAL_COMMUNITY): Payer: Medicare Other

## 2018-12-22 ENCOUNTER — Other Ambulatory Visit: Payer: Self-pay

## 2018-12-22 ENCOUNTER — Encounter (HOSPITAL_COMMUNITY): Payer: Self-pay | Admitting: Emergency Medicine

## 2018-12-22 DIAGNOSIS — W1830XA Fall on same level, unspecified, initial encounter: Secondary | ICD-10-CM | POA: Insufficient documentation

## 2018-12-22 DIAGNOSIS — R609 Edema, unspecified: Secondary | ICD-10-CM | POA: Diagnosis not present

## 2018-12-22 DIAGNOSIS — S81811A Laceration without foreign body, right lower leg, initial encounter: Secondary | ICD-10-CM

## 2018-12-22 DIAGNOSIS — Y92019 Unspecified place in single-family (private) house as the place of occurrence of the external cause: Secondary | ICD-10-CM | POA: Diagnosis not present

## 2018-12-22 DIAGNOSIS — Y999 Unspecified external cause status: Secondary | ICD-10-CM | POA: Insufficient documentation

## 2018-12-22 DIAGNOSIS — S6992XA Unspecified injury of left wrist, hand and finger(s), initial encounter: Secondary | ICD-10-CM | POA: Diagnosis not present

## 2018-12-22 DIAGNOSIS — Z7901 Long term (current) use of anticoagulants: Secondary | ICD-10-CM | POA: Insufficient documentation

## 2018-12-22 DIAGNOSIS — I4891 Unspecified atrial fibrillation: Secondary | ICD-10-CM | POA: Insufficient documentation

## 2018-12-22 DIAGNOSIS — S51012A Laceration without foreign body of left elbow, initial encounter: Secondary | ICD-10-CM | POA: Insufficient documentation

## 2018-12-22 DIAGNOSIS — S0181XA Laceration without foreign body of other part of head, initial encounter: Secondary | ICD-10-CM | POA: Diagnosis not present

## 2018-12-22 DIAGNOSIS — S0993XA Unspecified injury of face, initial encounter: Secondary | ICD-10-CM | POA: Diagnosis not present

## 2018-12-22 DIAGNOSIS — M7989 Other specified soft tissue disorders: Secondary | ICD-10-CM | POA: Diagnosis not present

## 2018-12-22 DIAGNOSIS — E039 Hypothyroidism, unspecified: Secondary | ICD-10-CM | POA: Diagnosis not present

## 2018-12-22 DIAGNOSIS — Y939 Activity, unspecified: Secondary | ICD-10-CM | POA: Diagnosis not present

## 2018-12-22 DIAGNOSIS — S0003XA Contusion of scalp, initial encounter: Secondary | ICD-10-CM | POA: Diagnosis not present

## 2018-12-22 DIAGNOSIS — R51 Headache: Secondary | ICD-10-CM | POA: Diagnosis not present

## 2018-12-22 DIAGNOSIS — Z87891 Personal history of nicotine dependence: Secondary | ICD-10-CM | POA: Diagnosis not present

## 2018-12-22 DIAGNOSIS — R6884 Jaw pain: Secondary | ICD-10-CM | POA: Diagnosis not present

## 2018-12-22 DIAGNOSIS — S62647A Nondisplaced fracture of proximal phalanx of left little finger, initial encounter for closed fracture: Secondary | ICD-10-CM | POA: Diagnosis not present

## 2018-12-22 DIAGNOSIS — R58 Hemorrhage, not elsewhere classified: Secondary | ICD-10-CM | POA: Diagnosis not present

## 2018-12-22 DIAGNOSIS — T07XXXA Unspecified multiple injuries, initial encounter: Secondary | ICD-10-CM | POA: Diagnosis present

## 2018-12-22 DIAGNOSIS — W19XXXA Unspecified fall, initial encounter: Secondary | ICD-10-CM | POA: Diagnosis not present

## 2018-12-22 DIAGNOSIS — S199XXA Unspecified injury of neck, initial encounter: Secondary | ICD-10-CM | POA: Diagnosis not present

## 2018-12-22 NOTE — ED Notes (Signed)
Pt verbalized understanding of discharge paperwork and follow-up care.  °

## 2018-12-22 NOTE — ED Provider Notes (Signed)
[facility] Department Provider Note MRN:  [id number]  Arrival date & time: 12/22/18     Chief Complaint   Fall   History of Present Illness   Katrina Velez is a 85 y.o. year-old female with a history of A. fib on Eliquis presenting to the ED with chief complaint of fall.  Patient explains that her cat likes to disrupt the carpeting of her house and normally she is careful not to trip on it.  She tripped on the carpet today.  Larey SeatFell forward striking her left jaw and forehead on the ground.  Also sustained skin tears to the right leg and left elbow.  She denies loss of consciousness.  Pain in the jaw is mild to moderate in severity.  She feels as if her bite is well aligned.  No nausea or vomiting since the fall.  Denies chest pain or shortness of breath, no abdominal pain.  Review of Systems  A complete 10 system review of systems was obtained and all systems are negative except as noted in the HPI and PMH.   Patient's Health History    Past Medical History:  Diagnosis Date  . A-fib (HCC) 05/2015   HOSPITALIZED   . Arthritis   . Cataract   . Hypothyroidism   . Permanent atrial fibrillation   . Thyroid disease     Past Surgical History:  Procedure Laterality Date  . CARDIOVERSION N/A 06/08/2015   Procedure: CARDIOVERSION;  Surgeon: Jake BatheMark C Skains, MD;  [facility];  Service: Cardiovascular;  Laterality: N/A;  . CARDIOVERSION N/A 09/02/2015   Procedure: CARDIOVERSION;  Surgeon: Laqueta LindenSuresh A Koneswaran, MD;  [facility];  Service: Cardiovascular;  Laterality: N/A;  . CATARACT EXTRACTION, BILATERAL Bilateral 2010  . JOINT REPLACEMENT    . KNEE SURGERY  2002  . right hip replacement    . RIGHT KNEE REPLACEMENT  2011  . TEE WITHOUT CARDIOVERSION N/A 06/08/2015   Procedure: TRANSESOPHAGEAL ECHOCARDIOGRAM (TEE);  Surgeon: Jake BatheMark C Skains, MD;  [facility];  Service: Cardiovascular;  Laterality: N/A;  . TOTAL HIP ARTHROPLASTY Right  2011  . TOTAL SHOULDER REPLACEMENT Right 2003  . TOTAL SHOULDER REPLACEMENT Left 2007  . WRIST SURGERY Bilateral 2008   both wrists    Family History  Problem Relation Age of Onset  . Hypercalcemia Daughter   . Hypertension Daughter     Social History   Socioeconomic History  . Marital status: Widowed    Spouse name: Not on file  . Number of children: Not on file  . Years of education: Not on file  . Highest education level: Not on file  Occupational History  . Not on file  Social Needs  . Financial resource strain: Not hard at all  . Food insecurity    Worry: Never true    Inability: Never true  . Transportation needs    Medical: No    Non-medical: No  Tobacco Use  . Smoking status: Former Smoker    Packs/day: 1.00    Years: 65.00    Pack years: 65.00    Types: Cigarettes    Quit date: 05/28/2011    Years since quitting: 7.5  . Smokeless tobacco: Never Used  Substance and Sexual Activity  . Alcohol use: Yes    Alcohol/week: 1.0 - 2.0 standard drinks    Types: 1 - 2 Glasses of wine per week    Comment: occasional drink  . Drug use: No  . Sexual activity: Not  on file  Lifestyle  . Physical activity    Days per week: 0 days    Minutes per session: 0 min  . Stress: Only a little  Relationships  . Social connections    Talks on phone: More than three times a week    Gets together: More than three times a week    Attends religious service: Never    Active member of club or organization: No    Attends meetings of clubs or organizations: Never    Relationship status: Widowed  . Intimate partner violence    Fear of current or ex partner: No    Emotionally abused: No    Physically abused: No    Forced sexual activity: No  Other Topics Concern  . Not on file  Social History Narrative   DIET: NONE      DO YOU DRINK/EAT THINGS WITH CAFFEINE: YES, COFFEE AND TEA       MARITAL STATUS: WIDOWED      WHAT YEAR WERE YOU MARRIED:1950      DO YOU LIVE IN A HOUSE,  APARTMENT, ASSISTED LIVING, CONDO TRAILER ETC.: HOUSE       IS IT ONE OR MORE STORIES: 2 STORIES      HOW MANY PERSONS LIVE IN YOUR HOME: 1      DO YOU HAVE PETS IN YOUR HOME: 1 CAT AND A PART TIME DOG       CURRENT OR PAST PROFESSION: CIVIL SERVANT      DO YOU EXERCISE: NO       WHAT TYPE AND HOW OFTEN:NO     Physical Exam  Vital Signs and Nursing Notes reviewed Vitals:   12/22/18 1415 12/22/18 1430  BP: 133/64 135/78  Pulse: 99 (!) 59  Resp: (!) 21 16  Temp:    SpO2: 95% 97%    CONSTITUTIONAL: Well-appearing, NAD NEURO:  Alert and oriented x 3, normal and symmetric strength and sensation, normal coordination, normal speech EYES:  eyes equal and reactive ENT/NECK:  no LAD, no JVD CARDIO: Regular rate, well-perfused, normal S1 and S2 PULM:  CTAB no wheezing or rhonchi GI/GU:  normal bowel sounds, non-distended, non-tender MSK/SPINE:  No gross deformities, no edema SKIN: Left elbow skin tear, right mid shin skin tear, 2 cm laceration to the left forehead, bruising and edema to the left mandible with overlying abrasion PSYCH:  Appropriate speech and behavior  Diagnostic and Interventional Summary    Labs Reviewed - No data to display  CT HEAD WO CONTRAST    (Results Pending)  CT CERVICAL SPINE WO CONTRAST    (Results Pending)  CT MAXILLOFACIAL WO CONTRAST    (Results Pending)    Medications - No data to display   .Marland KitchenLaceration Repair  Date/Time: 12/22/2018 3:10 PM Performed by: Maudie Flakes, MD Authorized by: Maudie Flakes, MD   Consent:    Consent obtained:  Verbal   Consent given by:  Patient   Risks discussed:  Pain and poor cosmetic result Anesthesia (see MAR for exact dosages):    Anesthesia method:  None Laceration details:    Location:  Face   Face location:  Forehead   Length (cm):  2   Depth (mm):  1 Repair type:    Repair type:  Simple Treatment:    Amount of cleaning:  Standard   Irrigation solution:  Sterile water Skin repair:    Repair  method:  Tissue adhesive Approximation:    Approximation:  Close Post-procedure details:  Dressing:  Open (no dressing)   Patient tolerance of procedure:  Tolerated well, no immediate complications   Critical Care  ED Course and Medical Decision Making  I have reviewed the triage vital signs and the nursing notes.  Pertinent labs & imaging results that were available during my care of the patient were reviewed by me and considered in my medical decision making (see below for details).  Mechanical ground-level fall with facial trauma in this 85 year old female on Eliquis.  Some bruising and swelling noted to the left mandible, normal range of motion of the neck with no bony tenderness.  CTs of the head, cervical spine, maxillofacial pending.  Laceration of the forehead repaired as described above.  Will provide with nonadherent dressing supplies upon discharge.  Signed out to oncoming provider at shift change.  Elmer SowMichael M. Pilar PlateBero, MD [facility] [email or web address]  Final Clinical Impressions(s) / ED Diagnoses     ICD-10-CM   1. Laceration of forehead, initial encounter  S01.81XA   2. Fall, initial encounter  W19.XXXA   3. Jaw pain  R68.84   4. Skin tear of left elbow without complication, initial encounter  S51.012A   5. Skin tear of right lower leg without complication, initial encounter  G29.528US81.811A     ED Discharge Orders    None         Sabas SousBero, Michael M, MD 12/22/18 1511

## 2018-12-22 NOTE — Discharge Instructions (Signed)
For the skin tears leave the dressing in place for the next 2 days.  Then remove and wash gently with soap and water apply new antibiotic ointment and a new dressing.  Once she start doing the dressings at home would recommend to do that each day.  Make an appointment to follow-up with orthopedics for the finger fracture.  Keep your splint in place.  Make an appointment to follow-up with your regular doctor for follow-up of the wounds.  Return for any new or worse symptoms.

## 2018-12-22 NOTE — ED Notes (Signed)
Patient transported to X-ray 

## 2018-12-22 NOTE — ED Triage Notes (Signed)
Pt arrives via EMS from home with reports of tripping over her rug and hitting left side of head on door. Pt endorses pain to left jaw, skin tear to left arm and right leg. Pt on eliquis.

## 2018-12-22 NOTE — ED Provider Notes (Signed)
X-ray showed evidence of a proximal digit fracture of the fifth digit of the left hand.  Will treat with a splint.  Patient follow-up with Dr. Berenice Primas orthopedic office who is seen her in the past.  CT head neck and face without any acute findings.  Discussed skin tear wound care with the patient.   Fredia Sorrow, MD 12/22/18 [phone]

## 2018-12-23 ENCOUNTER — Ambulatory Visit (INDEPENDENT_AMBULATORY_CARE_PROVIDER_SITE_OTHER): Payer: Medicare Other | Admitting: Family

## 2018-12-23 ENCOUNTER — Encounter: Payer: Self-pay | Admitting: Family

## 2018-12-23 VITALS — BP 104/72 | HR 86 | Temp 97.4°F | Ht 63.0 in | Wt 150.6 lb

## 2018-12-23 DIAGNOSIS — M48062 Spinal stenosis, lumbar region with neurogenic claudication: Secondary | ICD-10-CM | POA: Diagnosis not present

## 2018-12-23 DIAGNOSIS — R2681 Unsteadiness on feet: Secondary | ICD-10-CM

## 2018-12-23 DIAGNOSIS — S51812S Laceration without foreign body of left forearm, sequela: Secondary | ICD-10-CM | POA: Diagnosis not present

## 2018-12-23 DIAGNOSIS — M1991 Primary osteoarthritis, unspecified site: Secondary | ICD-10-CM

## 2018-12-23 DIAGNOSIS — S81811S Laceration without foreign body, right lower leg, sequela: Secondary | ICD-10-CM

## 2018-12-23 DIAGNOSIS — W19XXXD Unspecified fall, subsequent encounter: Secondary | ICD-10-CM

## 2018-12-23 DIAGNOSIS — S0083XD Contusion of other part of head, subsequent encounter: Secondary | ICD-10-CM

## 2018-12-23 DIAGNOSIS — Y92009 Unspecified place in unspecified non-institutional (private) residence as the place of occurrence of the external cause: Secondary | ICD-10-CM | POA: Diagnosis not present

## 2018-12-23 MED ORDER — TRAMADOL HCL 50 MG PO TABS: 50.0000 mg | ORAL_TABLET | Freq: Three times a day (TID) | ORAL | 0 refills | Status: DC | PRN

## 2018-12-23 NOTE — Progress Notes (Signed)
Provider: Risha Barretta FNP-C  Lauree Chandler, NP  Patient Care Team: Lauree Chandler, NP as PCP - General (Geriatric Medicine) Warden Fillers, MD as Consulting Physician (Ophthalmology) Sherran Needs, NP as Nurse Practitioner (Cardiology)  Extended Emergency Contact Information Primary Emergency Contact: [facility] Address: [place], [place] [address] [place] of [place] Phone: [phone] Mobile Phone: [phone] Relation: Daughter Secondary Emergency Contact: [name] of [place] Phone: [phone] Mobile Phone: [phone] Relation: Son  Code Status:   Goals of care: Advanced Directive information Advanced Directives 10/04/2018  Does Patient Have a Medical Advance Directive? Yes  Type of Advance Directive Living will  Does patient want to make changes to medical advance directive? No - Patient declined  Copy of [place] in Chart? -  Would patient like information on creating a medical advance directive? -  Pre-existing out of facility DNR order (yellow form or pink MOST form) -     Chief Complaint  Patient presents with   Acute Visit    Fall Follow up  patient states she fell yesterday and ended up at ER for 5 hours, patient states broke finger, badly bruised face, bruised legs.    Medication Management    patient states she lost bottile of Tramadol and only has 1 pill left needs a refill     HPI:  Pt is a 85 y.o. female seen today  for an acute visit for follow up Ed visit for fall.she states Her cat likes to disrupt the carpet so she tripped on the carpet hitting her jaw.she was evaluated in the ED.CT scan of the head ,cervical spine was unremarkable.X-ray of left hand showed a proximal digit fracture of the 5th digit.Splint was applied and patient instructed to follow up with Orthopedic Dr.Graves.she also had a laceration on her forehead which was repaired with derma  bond.she also had skin tears to her right lower leg and left forearm cleansed and covered with Xeroform covered with gauze and wrapped with coban.she had bruising to her left mandible.she is currently on Eliquis 5 mg tablet daily for Afib.she complains of pain on her jaw area described as " sore" when she tries to chew her food.she has been drinking her boost proteins and vegetable juice.she has take her tramadol but states unable to locate the bottle for her medication in the house.Only has filled up medication on her " Bubbles".Daughter states patient rarely needs her tramadol wonders whether bottle was thrown in the trash accidentally.     Past Medical History:  Diagnosis Date   A-fib ([place]) 05/2015   HOSPITALIZED    Arthritis    Cataract    Hypothyroidism    Permanent atrial fibrillation    Thyroid disease    Past Surgical History:  Procedure Laterality Date   CARDIOVERSION N/A 06/08/2015   Procedure: CARDIOVERSION;  Surgeon: Jerline Pain, MD;  Location: [facility] ENDOSCOPY;  Service: Cardiovascular;  Laterality: N/A;   CARDIOVERSION N/A 09/02/2015   Procedure: CARDIOVERSION;  Surgeon: Herminio Commons, MD;  Location: [place];  Service: Cardiovascular;  Laterality: N/A;   CATARACT EXTRACTION, BILATERAL Bilateral 2010   JOINT REPLACEMENT     KNEE SURGERY  2002   right hip replacement     RIGHT KNEE REPLACEMENT  2011   TEE WITHOUT CARDIOVERSION N/A 06/08/2015   Procedure: TRANSESOPHAGEAL ECHOCARDIOGRAM (TEE);  Surgeon: Jerline Pain, MD;  Location: [place];  Service:  Cardiovascular;  Laterality: N/A;   TOTAL HIP ARTHROPLASTY Right 2011   TOTAL SHOULDER REPLACEMENT Right 2003   TOTAL SHOULDER REPLACEMENT Left 2007   WRIST SURGERY Bilateral 2008   both wrists    Allergies  Allergen Reactions   Banana Nausea Only    All banana products also   Penicillins Hives    Outpatient Encounter Medications as of 12/23/2018  Medication Sig   ascorbic acid (VITAMIN  C) 500 MG tablet Take 1,000 mg by mouth daily.   CARDIZEM LA 120 MG 24 hr tablet TAKE 1 TABLET (120 MG TOTAL) BY MOUTH 2 (TWO) TIMES DAILY.   cholecalciferol (VITAMIN D) 1000 units tablet Take 1,000 Units by mouth daily.   ELIQUIS 5 MG TABS tablet TAKE 1 TABLET (5 MG TOTAL) BY MOUTH 2 (TWO) TIMES DAILY.   furosemide (LASIX) 20 MG tablet TAKE 1 TABLET DAILY AS NEEDED FOR WEIGHT GAIN >3LBS.   metoprolol succinate (TOPROL-XL) 25 MG 24 hr tablet TAKE 1 TABLET BY MOUTH TWICE A DAY   Multiple Vitamin (MULTIVITAMIN) capsule Take 1 capsule by mouth daily.   SYNTHROID 125 MCG tablet TAKE 1 TABLET (125 MCG TOTAL) BY MOUTH DAILY BEFORE BREAKFAST.   traMADol (ULTRAM) 50 MG tablet Take 1 tablet (50 mg total) by mouth every 8 (eight) hours as needed. for pain   vitamin E 400 UNIT capsule Take 400 Units by mouth daily.   [DISCONTINUED] traMADol (ULTRAM) 50 MG tablet Take 1 tablet (50 mg total) by mouth every 8 (eight) hours as needed. for pain   No facility-administered encounter medications on file as of 12/23/2018.     Review of Systems  Constitutional: Negative for appetite change, chills, fatigue and fever.  HENT: Positive for hearing loss. Negative for congestion, rhinorrhea, sinus pressure, sinus pain, sneezing and sore throat.   Eyes: Negative for discharge, redness, itching and visual disturbance.  Respiratory: Negative for cough, chest tightness, shortness of breath and wheezing.   Cardiovascular: Negative for chest pain, palpitations and leg swelling.  Gastrointestinal: Negative for abdominal distention, abdominal pain, constipation, diarrhea, nausea and vomiting.  Endocrine: Negative for polydipsia, polyphagia and polyuria.  Genitourinary: Negative for difficulty urinating, dysuria, flank pain, frequency and urgency.  Musculoskeletal: Positive for arthralgias and gait problem. Negative for neck pain and neck stiffness.  Skin: Negative for color change, pallor and rash.       Right lower  leg and left forearm skin tear  Left mandible /neck bruise   Neurological: Negative for dizziness, weakness, light-headedness, numbness and headaches.  Psychiatric/Behavioral: Negative for agitation, confusion and sleep disturbance. The patient is not nervous/anxious.     Immunization History  Administered Date(s) Administered   Influenza Split 03/25/2012   Influenza, High Dose Seasonal PF 03/28/2018   Influenza,inj,Quad PF,6+ Mos 02/24/2013, 03/15/2015, 02/25/2016   Influenza-Unspecified 05/01/2014, 03/19/2017   Pneumococcal Conjugate-13 05/27/2014   Pneumococcal Polysaccharide-23 02/25/2016   Tdap 10/10/2016   Zoster 05/22/2001   Zoster Recombinat (Shingrix) 08/31/2017, 11/07/2017   Pertinent  Health Maintenance Due  Topic Date Due   INFLUENZA VACCINE  12/21/2018   DEXA SCAN  Completed   PNA vac Low Risk Adult  Completed   Fall Risk  12/23/2018 10/04/2018 06/19/2018 02/15/2018 01/07/2018  Falls in the past year? 1 0 0 No No  Number falls in past yr: 1 0 0 - -  Injury with Fall? 1 0 0 - -    Vitals:   12/23/18 1310  BP: 104/72  Pulse: 86  Temp: (!) 97.4 F (36.3  C)  TempSrc: Oral  SpO2: 95%  Weight: 150 lb 9.6 oz (68.3 kg)  Height: 5\' 3"  (1.6 m)   Body mass index is 26.68 kg/m. Physical Exam Vitals signs reviewed.  Constitutional:      General: She is not in acute distress.    Appearance: She is overweight. She is not ill-appearing.  HENT:     Head: Normocephalic.     Right Ear: Tympanic membrane, ear canal and external ear normal.     Left Ear: Ear canal and external ear normal. There is no impacted cerumen.     Ears:     Comments: Bilateral hearing aids     Nose: Nose normal. No congestion or rhinorrhea.     Mouth/Throat:     Mouth: Mucous membranes are moist.     Pharynx: Oropharynx is clear. No oropharyngeal exudate or posterior oropharyngeal erythema.  Eyes:     General: No scleral icterus.       Right eye: No discharge.        Left eye: No  discharge.     Extraocular Movements: Extraocular movements intact.     Conjunctiva/sclera: Conjunctivae normal.     Pupils: Pupils are equal, round, and reactive to light.  Neck:     Musculoskeletal: No neck rigidity or muscular tenderness.     Vascular: No carotid bruit.  Cardiovascular:     Rate and Rhythm: Normal rate. Rhythm irregular.     Pulses: Normal pulses.     Heart sounds: Normal heart sounds. No murmur. No friction rub. No gallop.   Pulmonary:     Effort: Pulmonary effort is normal. No respiratory distress.     Breath sounds: Normal breath sounds. No wheezing, rhonchi or rales.  Chest:     Chest wall: No tenderness.  Abdominal:     General: Bowel sounds are normal. There is no distension.     Palpations: Abdomen is soft. There is no mass.     Tenderness: There is no abdominal tenderness. There is no right CVA tenderness, left CVA tenderness, guarding or rebound.  Musculoskeletal:        General: No swelling or tenderness.     Right lower leg: No edema.     Left lower leg: No edema.     Comments: Unsteady gait walks with a cane  Lymphadenopathy:     Cervical: No cervical adenopathy.  Skin:    General: Skin is warm and dry.     Coloration: Skin is not pale.     Findings: Bruising present. No lesion or rash.     Comments: 1. Left mandible/neck area large purple bruise.Non tender to touch.  2. Left fore arm and right lower leg skin tear without any signs of infections.edges not approximable.cleanse with saline,pat dry,covered with Xeroform,4X 4 gauze wrapped with Kerlix for extra protection and secured with coban.   Neurological:     Mental Status: She is alert and oriented to person, place, and time.     Cranial Nerves: No cranial nerve deficit.     Sensory: No sensory deficit.     Motor: No weakness.     Coordination: Coordination normal.     Gait: Gait abnormal.  Psychiatric:        Mood and Affect: Mood normal.        Behavior: Behavior normal.        Thought  Content: Thought content normal.        Judgment: Judgment normal.    Labs  reviewed: Recent Labs    06/19/18 1106  NA 140  K 5.1  CL 103  CO2 29  GLUCOSE 108  BUN 20  CREATININE 0.78  CALCIUM 10.5*   Recent Labs    06/19/18 1106  AST 14  ALT 7  BILITOT 0.6  PROT 6.8   Recent Labs    06/19/18 1106  WBC 6.1  NEUTROABS 4,032  HGB 13.9  HCT 42.5  MCV 89.7  PLT 323   Lab Results  Component Value Date   TSH 0.40 06/19/2018   No results found for: HGBA1C Lab Results  Component Value Date   CHOL 193 05/27/2014   HDL 66 05/27/2014   LDLCALC 106 (H) 05/27/2014   TRIG 107 05/27/2014   CHOLHDL 2.9 05/27/2014    Significant Diagnostic Results in last 30 days:  Ct Head Wo Contrast  Result Date: 12/22/2018 CLINICAL DATA:  Tripped and fell forward striking her forehead and the left side of her face. Denies loss of consciousness. EXAM: CT HEAD WITHOUT CONTRAST CT MAXILLOFACIAL WITHOUT CONTRAST CT CERVICAL SPINE WITHOUT CONTRAST TECHNIQUE: Multidetector CT imaging of the head, cervical spine, and maxillofacial structures were performed using the standard protocol without intravenous contrast. Multiplanar CT image reconstructions of the cervical spine and maxillofacial structures were also generated. COMPARISON:  None. FINDINGS: CT HEAD FINDINGS Brain: No evidence of acute infarction, hemorrhage, hydrocephalus, extra-axial collection or mass lesion/mass effect. There is ventricular and sulcal enlargement reflecting age-appropriate volume loss. Mild periventricular white matter hypoattenuation is present consistent with chronic microvascular ischemic change. Vascular: No hyperdense vessel or unexpected calcification. Skull: Normal. Negative for fracture or focal lesion. Other: Small left frontal scalp contusion. CT MAXILLOFACIAL FINDINGS Osseous: No fracture or mandibular dislocation. No destructive process. Orbits: Negative. No traumatic or inflammatory finding. Sinuses: Clear.  Clear  mastoid air cells and middle ear cavities. Soft tissues: No soft tissue mass or hematoma. No notable contusion. CT CERVICAL SPINE FINDINGS Alignment: Reversal the normal cervical lordosis, apex at C6. Mild degenerative anterolisthesis of C5 on C6 and minimally of C3 and C4 and C4 on C5. No traumatic subluxation. Skull base and vertebrae: No acute fracture. No primary bone lesion or focal pathologic process. Soft tissues and spinal canal: No prevertebral fluid or swelling. No visible canal hematoma. Disc levels: Moderate loss of disc height, at all levels, least prominently involving C4-C5, most severe at C5-C6. Endplate spurring and mild spondylotic disc bulging at all levels except C4-C5. Bilateral facet degenerative changes are noted. No convincing disc herniation. Upper chest: No acute finding.  Clear lung apices. Other: None. IMPRESSION: HEAD CT 1. No acute intracranial abnormalities.  No skull fracture. 2. Atrophy and mild chronic microvascular ischemic change. MAXILLOFACIAL CT 1. No fracture or acute finding. CERVICAL CT 1. No fracture or acute finding. Electronically Signed   By: Amie Portland M.D.   On: 12/22/2018 16:07   Ct Cervical Spine Wo Contrast  Result Date: 12/22/2018 CLINICAL DATA:  Tripped and fell forward striking her forehead and the left side of her face. Denies loss of consciousness. EXAM: CT HEAD WITHOUT CONTRAST CT MAXILLOFACIAL WITHOUT CONTRAST CT CERVICAL SPINE WITHOUT CONTRAST TECHNIQUE: Multidetector CT imaging of the head, cervical spine, and maxillofacial structures were performed using the standard protocol without intravenous contrast. Multiplanar CT image reconstructions of the cervical spine and maxillofacial structures were also generated. COMPARISON:  None. FINDINGS: CT HEAD FINDINGS Brain: No evidence of acute infarction, hemorrhage, hydrocephalus, extra-axial collection or mass lesion/mass effect. There is ventricular and sulcal enlargement reflecting  age-appropriate volume  loss. Mild periventricular white matter hypoattenuation is present consistent with chronic microvascular ischemic change. Vascular: No hyperdense vessel or unexpected calcification. Skull: Normal. Negative for fracture or focal lesion. Other: Small left frontal scalp contusion. CT MAXILLOFACIAL FINDINGS Osseous: No fracture or mandibular dislocation. No destructive process. Orbits: Negative. No traumatic or inflammatory finding. Sinuses: Clear.  Clear mastoid air cells and middle ear cavities. Soft tissues: No soft tissue mass or hematoma. No notable contusion. CT CERVICAL SPINE FINDINGS Alignment: Reversal the normal cervical lordosis, apex at C6. Mild degenerative anterolisthesis of C5 on C6 and minimally of C3 and C4 and C4 on C5. No traumatic subluxation. Skull base and vertebrae: No acute fracture. No primary bone lesion or focal pathologic process. Soft tissues and spinal canal: No prevertebral fluid or swelling. No visible canal hematoma. Disc levels: Moderate loss of disc height, at all levels, least prominently involving C4-C5, most severe at C5-C6. Endplate spurring and mild spondylotic disc bulging at all levels except C4-C5. Bilateral facet degenerative changes are noted. No convincing disc herniation. Upper chest: No acute finding.  Clear lung apices. Other: None. IMPRESSION: HEAD CT 1. No acute intracranial abnormalities.  No skull fracture. 2. Atrophy and mild chronic microvascular ischemic change. MAXILLOFACIAL CT 1. No fracture or acute finding. CERVICAL CT 1. No fracture or acute finding. Electronically Signed   By: Amie Portlandavid  Ormond M.D.   On: 12/22/2018 16:07   Dg Hand Complete Left  Result Date: 12/22/2018 CLINICAL DATA:  Fall.  Left fifth finger swollen and black and blue. EXAM: LEFT HAND - COMPLETE 3+ VIEW COMPARISON:  None. FINDINGS: Subtle nondisplaced fracture across the radial margin of the distal aspect of the fifth finger proximal phalanx. No other convincing fracture. There are advanced  arthropathic changes. Marked joint space narrowing with small marginal osteophytes and subchondral cystic change and no erosions are noted involving the DIP and PIP joints of all fingers, mildly involving the IP joint of the thumb. There is milder asymmetric joint space narrowing of the PIP joints most evident of the fifth PIP joint. Joint space narrowing with mild subchondral sclerosis is noted at the scaphoid, trapezium trapezoid articulation and the trapezium first metacarpal articulation. Bones are diffusely demineralized. There is fifth finger soft tissue swelling. IMPRESSION: 1. Subtle nondisplaced fracture of the distal aspect of the proximal phalanx of the fifth finger, across the radial corner. 2. No other fractures. 3. 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 Electronically Signed   By: Amie Portlandavid  Ormond M.D.   On: 12/22/2018 16:33   Ct Maxillofacial Wo Contrast  Result Date: 12/22/2018 CLINICAL DATA:  Tripped and fell forward striking her forehead and the left side of her face. Denies loss of consciousness. EXAM: CT HEAD WITHOUT CONTRAST CT MAXILLOFACIAL WITHOUT CONTRAST CT CERVICAL SPINE WITHOUT CONTRAST TECHNIQUE: Multidetector CT imaging of the head, cervical spine, and maxillofacial structures were performed using the standard protocol without intravenous contrast. Multiplanar CT image reconstructions of the cervical spine and maxillofacial structures were also generated. COMPARISON:  None. FINDINGS: CT HEAD FINDINGS Brain: No evidence of acute infarction, hemorrhage, hydrocephalus, extra-axial collection or mass lesion/mass effect. There is ventricular and sulcal enlargement reflecting age-appropriate volume loss. Mild periventricular white matter hypoattenuation is present consistent with chronic microvascular ischemic change. Vascular: No hyperdense vessel or unexpected calcification. Skull: Normal. Negative for fracture or focal lesion. Other: Small left frontal scalp  contusion. CT MAXILLOFACIAL FINDINGS Osseous: No fracture or mandibular dislocation. No destructive process. Orbits: Negative. No traumatic or inflammatory finding. Sinuses:  Clear.  Clear mastoid air cells and middle ear cavities. Soft tissues: No soft tissue mass or hematoma. No notable contusion. CT CERVICAL SPINE FINDINGS Alignment: Reversal the normal cervical lordosis, apex at C6. Mild degenerative anterolisthesis of C5 on C6 and minimally of C3 and C4 and C4 on C5. No traumatic subluxation. Skull base and vertebrae: No acute fracture. No primary bone lesion or focal pathologic process. Soft tissues and spinal canal: No prevertebral fluid or swelling. No visible canal hematoma. Disc levels: Moderate loss of disc height, at all levels, least prominently involving C4-C5, most severe at C5-C6. Endplate spurring and mild spondylotic disc bulging at all levels except C4-C5. Bilateral facet degenerative changes are noted. No convincing disc herniation. Upper chest: No acute finding.  Clear lung apices. Other: None. IMPRESSION: HEAD CT 1. No acute intracranial abnormalities.  No skull fracture. 2. Atrophy and mild chronic microvascular ischemic change. MAXILLOFACIAL CT 1. No fracture or acute finding. CERVICAL CT 1. No fracture or acute finding. Electronically Signed   By: Amie Portlandavid  Ormond M.D.   On: 12/22/2018 16:07    Assessment/Plan 1. Laceration of Right lower leg, subsequent encounter Afebrile. right lower leg skin tear without any signs of infections.edges not approximable.cleanse with saline,pat dry,covered with Xeroform,4X 4 gauze wrapped with Kerlix for extra protection and secured with coban.  - Ambulatory referral to Home Health Nurse to change dressing every 3 days. - Monitor for signs of infections.Notify provider if running any fever > 100.5   2. Laceration of left forearm, sequela Afebrile.Left fore arm skin tear without any signs of infections.edges not approximable.cleanse with saline,pat  dry,covered with Xeroform,4X 4 gauze wrapped with Kerlix for extra protection and secured with coban.  - Ambulatory referral to Home Health to change dressing every 3 days. - Monitor for signs of infections.Notify provider if running any fever > 100.5   3. Unsteady gait Walks with a cane.Daughter states has a  Walk but does not use it.Encouraged to use walk instead of a cane for gait stability. - Ambulatory referral to Home Health Physical Therapy for gait stability,exercise,ROM and home safety evaluation.  4. Fall at home, subsequent encounter S/p ED follow up. - Ambulatory referral to Home Health as above.   5. Primary osteoarthritis, unspecified site - traMADol (ULTRAM) 50 MG tablet; Take 1 tablet (50 mg total) by mouth every 8 (eight) hours as needed. for pain  Dispense: 60 tablet; Refill: 0  6. Spinal stenosis of lumbar region with neurogenic claudication - traMADol (ULTRAM) 50 MG tablet; Take 1 tablet (50 mg total) by mouth every 8 (eight) hours as needed. for pain  Dispense: 60 tablet; Refill: 0  7. Left mandible Bruise  Large purple mandible/neck area bruise non-tender to touch.Home health Nurse to continue to monitor.   Family/ staff Communication: Reviewed plan of care with patient and Daughter.   Labs/tests ordered: None   Follow Up: 2 weeks for skin tears evaluation.   Caesar Bookmaninah C Jearldean Gutt, NP

## 2018-12-23 NOTE — Patient Instructions (Signed)
Home health Nurse to cleanse left forearm and right lower leg skin tears with saline,pat dry,cover with Xerofoam gauze,wrap with Kerlix and secure with coban.Please change dressing every three days.

## 2018-12-25 DIAGNOSIS — F329 Major depressive disorder, single episode, unspecified: Secondary | ICD-10-CM

## 2018-12-25 DIAGNOSIS — I4821 Permanent atrial fibrillation: Secondary | ICD-10-CM

## 2018-12-25 DIAGNOSIS — M150 Primary generalized (osteo)arthritis: Secondary | ICD-10-CM | POA: Diagnosis not present

## 2018-12-25 DIAGNOSIS — E890 Postprocedural hypothyroidism: Secondary | ICD-10-CM | POA: Diagnosis not present

## 2018-12-25 DIAGNOSIS — S62647D Nondisplaced fracture of proximal phalanx of left little finger, subsequent encounter for fracture with routine healing: Secondary | ICD-10-CM | POA: Diagnosis not present

## 2018-12-25 DIAGNOSIS — S81812D Laceration without foreign body, left lower leg, subsequent encounter: Secondary | ICD-10-CM | POA: Diagnosis not present

## 2018-12-25 DIAGNOSIS — M48062 Spinal stenosis, lumbar region with neurogenic claudication: Secondary | ICD-10-CM | POA: Diagnosis not present

## 2018-12-25 DIAGNOSIS — Z96651 Presence of right artificial knee joint: Secondary | ICD-10-CM | POA: Diagnosis not present

## 2018-12-25 DIAGNOSIS — W010XXD Fall on same level from slipping, tripping and stumbling without subsequent striking against object, subsequent encounter: Secondary | ICD-10-CM | POA: Diagnosis not present

## 2018-12-25 DIAGNOSIS — Z96611 Presence of right artificial shoulder joint: Secondary | ICD-10-CM | POA: Diagnosis not present

## 2018-12-25 DIAGNOSIS — Z7901 Long term (current) use of anticoagulants: Secondary | ICD-10-CM | POA: Diagnosis not present

## 2018-12-25 DIAGNOSIS — S51812D Laceration without foreign body of left forearm, subsequent encounter: Secondary | ICD-10-CM | POA: Diagnosis not present

## 2018-12-25 DIAGNOSIS — Z96641 Presence of right artificial hip joint: Secondary | ICD-10-CM | POA: Diagnosis not present

## 2018-12-25 DIAGNOSIS — Z96612 Presence of left artificial shoulder joint: Secondary | ICD-10-CM | POA: Diagnosis not present

## 2018-12-26 ENCOUNTER — Telehealth: Payer: Self-pay | Admitting: *Deleted

## 2018-12-26 ENCOUNTER — Telehealth: Payer: Self-pay

## 2018-12-26 DIAGNOSIS — M150 Primary generalized (osteo)arthritis: Secondary | ICD-10-CM | POA: Diagnosis not present

## 2018-12-26 DIAGNOSIS — S51812D Laceration without foreign body of left forearm, subsequent encounter: Secondary | ICD-10-CM | POA: Diagnosis not present

## 2018-12-26 DIAGNOSIS — R2681 Unsteadiness on feet: Secondary | ICD-10-CM

## 2018-12-26 DIAGNOSIS — M48062 Spinal stenosis, lumbar region with neurogenic claudication: Secondary | ICD-10-CM | POA: Diagnosis not present

## 2018-12-26 DIAGNOSIS — S62647D Nondisplaced fracture of proximal phalanx of left little finger, subsequent encounter for fracture with routine healing: Secondary | ICD-10-CM | POA: Diagnosis not present

## 2018-12-26 DIAGNOSIS — S81812D Laceration without foreign body, left lower leg, subsequent encounter: Secondary | ICD-10-CM | POA: Diagnosis not present

## 2018-12-26 DIAGNOSIS — E890 Postprocedural hypothyroidism: Secondary | ICD-10-CM | POA: Diagnosis not present

## 2018-12-26 NOTE — Telephone Encounter (Signed)
Patient's daughter called requesting order for Bedside Commode to be sent to [facility] on [place], [place] [phone]  Reason for request: decrease fall risk when using the restroom at night  Please advise, order is pending for completion and approval if necessary.

## 2018-12-26 NOTE — Telephone Encounter (Signed)
Betsy with [facility] called requesting verbal orders for PT 2x4 and OT eval. Verbal orders given.

## 2018-12-26 NOTE — Telephone Encounter (Signed)
Order completed

## 2018-12-26 NOTE — Telephone Encounter (Signed)
Order faxed, patients daughter aware

## 2018-12-30 DIAGNOSIS — S51812D Laceration without foreign body of left forearm, subsequent encounter: Secondary | ICD-10-CM | POA: Diagnosis not present

## 2018-12-30 DIAGNOSIS — S81812D Laceration without foreign body, left lower leg, subsequent encounter: Secondary | ICD-10-CM | POA: Diagnosis not present

## 2018-12-30 DIAGNOSIS — E890 Postprocedural hypothyroidism: Secondary | ICD-10-CM | POA: Diagnosis not present

## 2018-12-30 DIAGNOSIS — M48062 Spinal stenosis, lumbar region with neurogenic claudication: Secondary | ICD-10-CM | POA: Diagnosis not present

## 2018-12-30 DIAGNOSIS — S62647D Nondisplaced fracture of proximal phalanx of left little finger, subsequent encounter for fracture with routine healing: Secondary | ICD-10-CM | POA: Diagnosis not present

## 2018-12-30 DIAGNOSIS — M150 Primary generalized (osteo)arthritis: Secondary | ICD-10-CM | POA: Diagnosis not present

## 2019-01-01 DIAGNOSIS — E890 Postprocedural hypothyroidism: Secondary | ICD-10-CM | POA: Diagnosis not present

## 2019-01-01 DIAGNOSIS — M150 Primary generalized (osteo)arthritis: Secondary | ICD-10-CM | POA: Diagnosis not present

## 2019-01-01 DIAGNOSIS — M48062 Spinal stenosis, lumbar region with neurogenic claudication: Secondary | ICD-10-CM | POA: Diagnosis not present

## 2019-01-01 DIAGNOSIS — S62647D Nondisplaced fracture of proximal phalanx of left little finger, subsequent encounter for fracture with routine healing: Secondary | ICD-10-CM | POA: Diagnosis not present

## 2019-01-01 DIAGNOSIS — S51812D Laceration without foreign body of left forearm, subsequent encounter: Secondary | ICD-10-CM | POA: Diagnosis not present

## 2019-01-01 DIAGNOSIS — S81812D Laceration without foreign body, left lower leg, subsequent encounter: Secondary | ICD-10-CM | POA: Diagnosis not present

## 2019-01-02 ENCOUNTER — Telehealth: Payer: Self-pay | Admitting: Nurse Practitioner

## 2019-01-02 DIAGNOSIS — M150 Primary generalized (osteo)arthritis: Secondary | ICD-10-CM | POA: Diagnosis not present

## 2019-01-02 DIAGNOSIS — S62647D Nondisplaced fracture of proximal phalanx of left little finger, subsequent encounter for fracture with routine healing: Secondary | ICD-10-CM | POA: Diagnosis not present

## 2019-01-02 DIAGNOSIS — S51812D Laceration without foreign body of left forearm, subsequent encounter: Secondary | ICD-10-CM | POA: Diagnosis not present

## 2019-01-02 DIAGNOSIS — M48062 Spinal stenosis, lumbar region with neurogenic claudication: Secondary | ICD-10-CM | POA: Diagnosis not present

## 2019-01-02 DIAGNOSIS — S81812D Laceration without foreign body, left lower leg, subsequent encounter: Secondary | ICD-10-CM | POA: Diagnosis not present

## 2019-01-02 DIAGNOSIS — E890 Postprocedural hypothyroidism: Secondary | ICD-10-CM | POA: Diagnosis not present

## 2019-01-02 NOTE — Telephone Encounter (Signed)
Will need visit to evaluate left leg hematoma.

## 2019-01-02 NOTE — Telephone Encounter (Signed)
Will evaluate on upcoming appointment.Notify office if symptoms worsen.

## 2019-01-02 NOTE — Telephone Encounter (Signed)
Patient already had an appointment on Monday with Dinah for 2 week follow up. Edited appointment notes. Do you want to keep this appointment or have them come in sooner?

## 2019-01-02 NOTE — Telephone Encounter (Signed)
Katrina Velez with [facility] called.  They are treating patient for wound sustained by falling.  She states it appears that patient may have a hematoma on left leg that she wants to make provider aware of.

## 2019-01-02 NOTE — Telephone Encounter (Signed)
Patient was called and confirmed Monday's appointment and to call if symptoms changed or got any worse. Verbalized understanding and denied further questions.

## 2019-01-03 ENCOUNTER — Ambulatory Visit
Admission: RE | Admit: 2019-01-03 | Discharge: 2019-01-03 | Disposition: A | Discharge status: Home / Self Care | Payer: Medicare Other | Source: Ambulatory Visit | Attending: Family | Admitting: Family

## 2019-01-03 ENCOUNTER — Ambulatory Visit (INDEPENDENT_AMBULATORY_CARE_PROVIDER_SITE_OTHER): Payer: Medicare Other | Admitting: Family

## 2019-01-03 ENCOUNTER — Encounter: Payer: Self-pay | Admitting: Family

## 2019-01-03 ENCOUNTER — Other Ambulatory Visit: Payer: Self-pay

## 2019-01-03 VITALS — BP 118/80 | HR 113 | Temp 97.4°F | Ht 63.0 in | Wt 147.0 lb

## 2019-01-03 DIAGNOSIS — R000 Tachycardia, unspecified: Secondary | ICD-10-CM

## 2019-01-03 DIAGNOSIS — M25572 Pain in left ankle and joints of left foot: Secondary | ICD-10-CM

## 2019-01-03 DIAGNOSIS — S81811S Laceration without foreign body, right lower leg, sequela: Secondary | ICD-10-CM

## 2019-01-03 DIAGNOSIS — M7989 Other specified soft tissue disorders: Secondary | ICD-10-CM

## 2019-01-03 DIAGNOSIS — M25472 Effusion, left ankle: Secondary | ICD-10-CM

## 2019-01-03 DIAGNOSIS — S51812S Laceration without foreign body of left forearm, sequela: Secondary | ICD-10-CM

## 2019-01-03 DIAGNOSIS — M79662 Pain in left lower leg: Secondary | ICD-10-CM

## 2019-01-03 DIAGNOSIS — L03116 Cellulitis of left lower limb: Secondary | ICD-10-CM

## 2019-01-03 DIAGNOSIS — S8012XA Contusion of left lower leg, initial encounter: Secondary | ICD-10-CM | POA: Diagnosis not present

## 2019-01-03 DIAGNOSIS — S99912A Unspecified injury of left ankle, initial encounter: Secondary | ICD-10-CM | POA: Diagnosis not present

## 2019-01-03 LAB — CBC WITH DIFFERENTIAL/PLATELET
Absolute Monocytes: 869 cells/uL (ref 200–950)
Basophils Absolute: 77 cells/uL (ref 0–200)
Basophils Relative: 0.9 %
Eosinophils Absolute: 103 cells/uL (ref 15–500)
Eosinophils Relative: 1.2 %
HCT: 38.5 % (ref 35.0–45.0)
Hemoglobin: 12.7 g/dL (ref 11.7–15.5)
Lymphs Abs: 1049 cells/uL (ref 850–3900)
MCH: 29.6 pg (ref 27.0–33.0)
MCHC: 33 g/dL (ref 32.0–36.0)
MCV: 89.7 fL (ref 80.0–100.0)
MPV: 9.9 fL (ref 7.5–12.5)
Monocytes Relative: 10.1 %
Neutro Abs: 6502 cells/uL (ref 1500–7800)
Neutrophils Relative %: 75.6 %
Platelets: 467 10*3/uL — ABNORMAL HIGH (ref 140–400)
RBC: 4.29 10*6/uL (ref 3.80–5.10)
RDW: 12.8 % (ref 11.0–15.0)
Total Lymphocyte: 12.2 %
WBC: 8.6 10*3/uL (ref 3.8–10.8)

## 2019-01-03 MED ORDER — SACCHAROMYCES BOULARDII 250 MG PO CAPS: 250.0000 mg | ORAL_CAPSULE | Freq: Two times a day (BID) | ORAL | 0 refills | Status: AC

## 2019-01-03 MED ORDER — DOXYCYCLINE HYCLATE 100 MG PO TABS: 100.0000 mg | ORAL_TABLET | Freq: Two times a day (BID) | ORAL | 0 refills | Status: AC

## 2019-01-03 NOTE — Progress Notes (Signed)
Provider: Nizhoni Parlow FNP-C  Sharon Seller, NP  Patient Care Team: Sharon Seller, NP as PCP - General (Geriatric Medicine) Sallye Lat, MD as Consulting Physician (Ophthalmology) Newman Nip, NP as Nurse Practitioner (Cardiology)  Extended Emergency Contact Information Primary Emergency Contact: [facility] Address: [address] [place], [place] [address] [place] of [place] Home Phone: [phone] Mobile Phone: [phone] Relation: Daughter Secondary Emergency Contact: Vanice Sarah [place] of [place] Home Phone: [phone] Mobile Phone: [phone] Relation: Son  Code Status: Goals of care: Advanced Directive information Advanced Directives 10/04/2018  Does Patient Have a Medical Advance Directive? Yes  Type of Advance Directive Living will  Does patient want to make changes to medical advance directive? No - Patient declined  Copy of Healthcare Power of Attorney in Chart? -  Would patient like information on creating a medical advance directive? -  Pre-existing out of facility DNR order (yellow form or pink MOST form) -     Chief Complaint  Patient presents with   Medical Management of Chronic Issues    Follow up on left leg [facility] suspects hematoma patient states leg has gotten worse and is swollen       HPI:  Pt is a 85 y.o. female seen today for an acute visit for evaluation of left leg swelling.she is here escorted by her daughter.she is status post fall 12/22/18 was evaluated in the ED.she had laceration to right leg and left forearm dressing changed by Home health Nurse.Nurse reports patient has developed a swelling? Hematoma on left leg.she states swelling and purple Bruises from her left side of her leg down to the toes.Daughter states upper left leg has become more red and tender to touch.she denies any fever, chills,chest pain or shortness of breath.she denies any new fall or injury to left leg.currently on  EliQuis 5 mg tablet twice daily.she has been working [facility] due to her unsteady gait and fall episode.Daughter and patient concerned states Physical Therapy came into their home without wearing a mask and when asked why " Therapist said wearing a mask was political but he could get it anyway from the car".patient concerned of COVID-19 exposure from Therapy.Referral coordinator notified to follow up with Home Health Agency.       Tachycardia -HR elevated this visit.she states has not taken her medication usually takes it at around 10 am but had to come for visit.   Past Medical History:  Diagnosis Date   A-fib (HCC) 05/2015   HOSPITALIZED    Arthritis    Cataract    Hypothyroidism    Permanent atrial fibrillation    Thyroid disease    Past Surgical History:  Procedure Laterality Date   CARDIOVERSION N/A 06/08/2015   Procedure: CARDIOVERSION;  Surgeon: Jake Bathe, MD;  Location: [facility] ENDOSCOPY;  Service: Cardiovascular;  Laterality: N/A;   CARDIOVERSION N/A 09/02/2015   Procedure: CARDIOVERSION;  Surgeon: Laqueta Linden, MD;  Location: [facility] ENDOSCOPY;  Service: Cardiovascular;  Laterality: N/A;   CATARACT EXTRACTION, BILATERAL Bilateral 2010   JOINT REPLACEMENT     KNEE SURGERY  2002   right hip replacement     RIGHT KNEE REPLACEMENT  2011   TEE WITHOUT CARDIOVERSION N/A 06/08/2015   Procedure: TRANSESOPHAGEAL ECHOCARDIOGRAM (TEE);  Surgeon: Jake Bathe, MD;  Location: [facility] ENDOSCOPY;  Service: Cardiovascular;  Laterality: N/A;   TOTAL HIP ARTHROPLASTY Right 2011   TOTAL SHOULDER REPLACEMENT Right 2003  TOTAL SHOULDER REPLACEMENT Left 2007   WRIST SURGERY Bilateral 2008   both wrists    Allergies  Allergen Reactions   Banana Nausea Only    All banana products also   Penicillins Hives    Outpatient Encounter Medications as of 01/03/2019  Medication Sig   ascorbic acid (VITAMIN C) 500 MG tablet Take 1,000 mg by mouth daily.    CARDIZEM LA 120 MG 24 hr tablet TAKE 1 TABLET (120 MG TOTAL) BY MOUTH 2 (TWO) TIMES DAILY.   cholecalciferol (VITAMIN D) 1000 units tablet Take 1,000 Units by mouth daily.   ELIQUIS 5 MG TABS tablet TAKE 1 TABLET (5 MG TOTAL) BY MOUTH 2 (TWO) TIMES DAILY.   furosemide (LASIX) 20 MG tablet TAKE 1 TABLET DAILY AS NEEDED FOR WEIGHT GAIN >3LBS.   metoprolol succinate (TOPROL-XL) 25 MG 24 hr tablet TAKE 1 TABLET BY MOUTH TWICE A DAY   Multiple Vitamin (MULTIVITAMIN) capsule Take 1 capsule by mouth daily.   SYNTHROID 125 MCG tablet TAKE 1 TABLET (125 MCG TOTAL) BY MOUTH DAILY BEFORE BREAKFAST.   traMADol (ULTRAM) 50 MG tablet Take 1 tablet (50 mg total) by mouth every 8 (eight) hours as needed. for pain   vitamin E 400 UNIT capsule Take 400 Units by mouth daily.   No facility-administered encounter medications on file as of 01/03/2019.     Review of Systems  Constitutional: Negative for appetite change, chills, fatigue and fever.  Respiratory: Negative for cough, chest tightness, shortness of breath and wheezing.   Cardiovascular: Negative for chest pain, palpitations and leg swelling.  Gastrointestinal: Negative for abdominal distention, abdominal pain, constipation, diarrhea, nausea and vomiting.  Musculoskeletal: Positive for arthralgias and gait problem. Negative for joint swelling.  Skin: Negative for color change, pallor and rash.       Left leg swelling and bruise per HPI   Neurological: Negative for dizziness, light-headedness and headaches.  Psychiatric/Behavioral: Negative for agitation and sleep disturbance. The patient is not nervous/anxious.     Immunization History  Administered Date(s) Administered   Influenza Split 03/25/2012   Influenza, High Dose Seasonal PF 03/28/2018   Influenza,inj,Quad PF,6+ Mos 02/24/2013, 03/15/2015, 02/25/2016   Influenza-Unspecified 05/01/2014, 03/19/2017   Pneumococcal Conjugate-13 05/27/2014   Pneumococcal Polysaccharide-23  02/25/2016   Tdap 10/10/2016   Zoster 05/22/2001   Zoster Recombinat (Shingrix) 08/31/2017, 11/07/2017   Pertinent  Health Maintenance Due  Topic Date Due   INFLUENZA VACCINE  12/21/2018   DEXA SCAN  Completed   PNA vac Low Risk Adult  Completed   Fall Risk  01/03/2019 12/23/2018 10/04/2018 06/19/2018 02/15/2018  Falls in the past year? 1 1 0 0 No  Number falls in past yr: 1 1 0 0 -  Injury with Fall? 1 1 0 0 -    Vitals:   01/03/19 1004  BP: 118/80  Pulse: (!) 113  Temp: (!) 97.4 F (36.3 C)  TempSrc: Oral  SpO2: 98%  Weight: 147 lb (66.7 kg)  Height: 5\' 3"  (1.6 m)   Body mass index is 26.04 kg/m. Physical Exam Constitutional:      General: She is not in acute distress.    Appearance: She is overweight. She is not ill-appearing.  HENT:     Head: Normocephalic.     Mouth/Throat:     Mouth: Mucous membranes are moist.     Pharynx: Oropharynx is clear. No oropharyngeal exudate or posterior oropharyngeal erythema.  Eyes:     General: No scleral icterus.  Right eye: No discharge.        Left eye: No discharge.     Conjunctiva/sclera: Conjunctivae normal.     Pupils: Pupils are equal, round, and reactive to light.  Neck:     Musculoskeletal: Normal range of motion. No neck rigidity or muscular tenderness.  Cardiovascular:     Rate and Rhythm: Tachycardia present. Rhythm irregular.     Pulses: Normal pulses.     Heart sounds: Normal heart sounds. No murmur. No friction rub. No gallop.   Pulmonary:     Effort: Pulmonary effort is normal. No respiratory distress.     Breath sounds: Normal breath sounds. No wheezing, rhonchi or rales.  Chest:     Chest wall: No tenderness.  Abdominal:     General: Bowel sounds are normal. There is no distension.     Palpations: Abdomen is soft. There is no mass.     Tenderness: There is no abdominal tenderness. There is no right CVA tenderness, left CVA tenderness, guarding or rebound.  Musculoskeletal: Normal range of motion.         General: No tenderness.     Right lower leg: No edema.     Left lower leg: No edema.     Comments: Bilateral knee old surgical incision intact.Left malleolus, nonpitting edema,red and tender to touch.   Lymphadenopathy:     Cervical: No cervical adenopathy.  Skin:    General: Skin is warm and dry.     Coloration: Skin is not pale.     Findings: Bruising and erythema present. No rash.     Comments: Left medial lateral upper redness, leg firm swelling with small Pimple with yellow- whitish fluid.Purple bruise to lower leg extending to foot/toes.FROM to foot non-tender to touch.   Right leg and left fore arm dressing changed.wound bed cleansed with saline,pat dry,Xerofoam cut to fit,covered with non-adhesive gauze,4X 4 gauze and secured with Kerlix,coban and tape.No signs of infection noted. Small amount of blood noted on old dressing.   Neurological:     Mental Status: She is alert and oriented to person, place, and time.     Cranial Nerves: No cranial nerve deficit.     Sensory: No sensory deficit.     Motor: No weakness.     Coordination: Coordination normal.     Gait: Gait abnormal.  Psychiatric:        Mood and Affect: Mood normal.        Behavior: Behavior normal.        Thought Content: Thought content normal.        Judgment: Judgment normal.    Labs reviewed: Recent Labs    06/19/18 1106  NA 140  K 5.1  CL 103  CO2 29  GLUCOSE 108  BUN 20  CREATININE 0.78  CALCIUM 10.5*   Recent Labs    06/19/18 1106  AST 14  ALT 7  BILITOT 0.6  PROT 6.8   Recent Labs    06/19/18 1106  WBC 6.1  NEUTROABS 4,032  HGB 13.9  HCT 42.5  MCV 89.7  PLT 323   Lab Results  Component Value Date   TSH 0.40 06/19/2018   No results found for: HGBA1C Lab Results  Component Value Date   CHOL 193 05/27/2014   HDL 66 05/27/2014   LDLCALC 106 (H) 05/27/2014   TRIG 107 05/27/2014   CHOLHDL 2.9 05/27/2014    Significant Diagnostic Results in last 30 days:  Ct Head Wo  Contrast  Result Date: 12/22/2018 CLINICAL DATA:  Tripped and fell forward striking her forehead and the left side of her face. Denies loss of consciousness. EXAM: CT HEAD WITHOUT CONTRAST CT MAXILLOFACIAL WITHOUT CONTRAST CT CERVICAL SPINE WITHOUT CONTRAST TECHNIQUE: Multidetector CT imaging of the head, cervical spine, and maxillofacial structures were performed using the standard protocol without intravenous contrast. Multiplanar CT image reconstructions of the cervical spine and maxillofacial structures were also generated. COMPARISON:  None. FINDINGS: CT HEAD FINDINGS Brain: No evidence of acute infarction, hemorrhage, hydrocephalus, extra-axial collection or mass lesion/mass effect. There is ventricular and sulcal enlargement reflecting age-appropriate volume loss. Mild periventricular white matter hypoattenuation is present consistent with chronic microvascular ischemic change. Vascular: No hyperdense vessel or unexpected calcification. Skull: Normal. Negative for fracture or focal lesion. Other: Small left frontal scalp contusion. CT MAXILLOFACIAL FINDINGS Osseous: No fracture or mandibular dislocation. No destructive process. Orbits: Negative. No traumatic or inflammatory finding. Sinuses: Clear.  Clear mastoid air cells and middle ear cavities. Soft tissues: No soft tissue mass or hematoma. No notable contusion. CT CERVICAL SPINE FINDINGS Alignment: Reversal the normal cervical lordosis, apex at C6. Mild degenerative anterolisthesis of C5 on C6 and minimally of C3 and C4 and C4 on C5. No traumatic subluxation. Skull base and vertebrae: No acute fracture. No primary bone lesion or focal pathologic process. Soft tissues and spinal canal: No prevertebral fluid or swelling. No visible canal hematoma. Disc levels: Moderate loss of disc height, at all levels, least prominently involving C4-C5, most severe at C5-C6. Endplate spurring and mild spondylotic disc bulging at all levels except C4-C5. Bilateral facet  degenerative changes are noted. No convincing disc herniation. Upper chest: No acute finding.  Clear lung apices. Other: None. IMPRESSION: HEAD CT 1. No acute intracranial abnormalities.  No skull fracture. 2. Atrophy and mild chronic microvascular ischemic change. MAXILLOFACIAL CT 1. No fracture or acute finding. CERVICAL CT 1. No fracture or acute finding. Electronically Signed   By: Amie Portlandavid  Ormond M.D.   On: 12/22/2018 16:07   Ct Cervical Spine Wo Contrast  Result Date: 12/22/2018 CLINICAL DATA:  Tripped and fell forward striking her forehead and the left side of her face. Denies loss of consciousness. EXAM: CT HEAD WITHOUT CONTRAST CT MAXILLOFACIAL WITHOUT CONTRAST CT CERVICAL SPINE WITHOUT CONTRAST TECHNIQUE: Multidetector CT imaging of the head, cervical spine, and maxillofacial structures were performed using the standard protocol without intravenous contrast. Multiplanar CT image reconstructions of the cervical spine and maxillofacial structures were also generated. COMPARISON:  None. FINDINGS: CT HEAD FINDINGS Brain: No evidence of acute infarction, hemorrhage, hydrocephalus, extra-axial collection or mass lesion/mass effect. There is ventricular and sulcal enlargement reflecting age-appropriate volume loss. Mild periventricular white matter hypoattenuation is present consistent with chronic microvascular ischemic change. Vascular: No hyperdense vessel or unexpected calcification. Skull: Normal. Negative for fracture or focal lesion. Other: Small left frontal scalp contusion. CT MAXILLOFACIAL FINDINGS Osseous: No fracture or mandibular dislocation. No destructive process. Orbits: Negative. No traumatic or inflammatory finding. Sinuses: Clear.  Clear mastoid air cells and middle ear cavities. Soft tissues: No soft tissue mass or hematoma. No notable contusion. CT CERVICAL SPINE FINDINGS Alignment: Reversal the normal cervical lordosis, apex at C6. Mild degenerative anterolisthesis of C5 on C6 and minimally  of C3 and C4 and C4 on C5. No traumatic subluxation. Skull base and vertebrae: No acute fracture. No primary bone lesion or focal pathologic process. Soft tissues and spinal canal: No prevertebral fluid or swelling. No visible canal hematoma. Disc levels: Moderate loss of disc height,  at all levels, least prominently involving C4-C5, most severe at C5-C6. Endplate spurring and mild spondylotic disc bulging at all levels except C4-C5. Bilateral facet degenerative changes are noted. No convincing disc herniation. Upper chest: No acute finding.  Clear lung apices. Other: None. IMPRESSION: HEAD CT 1. No acute intracranial abnormalities.  No skull fracture. 2. Atrophy and mild chronic microvascular ischemic change. MAXILLOFACIAL CT 1. No fracture or acute finding. CERVICAL CT 1. No fracture or acute finding. Electronically Signed   By: Amie Portland M.D.   On: 12/22/2018 16:07   Dg Hand Complete Left  Result Date: 12/22/2018 CLINICAL DATA:  Fall.  Left fifth finger swollen and black and blue. EXAM: LEFT HAND - COMPLETE 3+ VIEW COMPARISON:  None. FINDINGS: Subtle nondisplaced fracture across the radial margin of the distal aspect of the fifth finger proximal phalanx. No other convincing fracture. There are advanced arthropathic changes. Marked joint space narrowing with small marginal osteophytes and subchondral cystic change and no erosions are noted involving the DIP and PIP joints of all fingers, mildly involving the IP joint of the thumb. There is milder asymmetric joint space narrowing of the PIP joints most evident of the fifth PIP joint. Joint space narrowing with mild subchondral sclerosis is noted at the scaphoid, trapezium trapezoid articulation and the trapezium first metacarpal articulation. Bones are diffusely demineralized. There is fifth finger soft tissue swelling. IMPRESSION: 1. Subtle nondisplaced fracture of the distal aspect of the proximal phalanx of the fifth finger, across the radial corner. 2.  No other fractures. 3. 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 Electronically Signed   By: Amie Portland M.D.   On: 12/22/2018 16:33   Ct Maxillofacial Wo Contrast  Result Date: 12/22/2018 CLINICAL DATA:  Tripped and fell forward striking her forehead and the left side of her face. Denies loss of consciousness. EXAM: CT HEAD WITHOUT CONTRAST CT MAXILLOFACIAL WITHOUT CONTRAST CT CERVICAL SPINE WITHOUT CONTRAST TECHNIQUE: Multidetector CT imaging of the head, cervical spine, and maxillofacial structures were performed using the standard protocol without intravenous contrast. Multiplanar CT image reconstructions of the cervical spine and maxillofacial structures were also generated. COMPARISON:  None. FINDINGS: CT HEAD FINDINGS Brain: No evidence of acute infarction, hemorrhage, hydrocephalus, extra-axial collection or mass lesion/mass effect. There is ventricular and sulcal enlargement reflecting age-appropriate volume loss. Mild periventricular white matter hypoattenuation is present consistent with chronic microvascular ischemic change. Vascular: No hyperdense vessel or unexpected calcification. Skull: Normal. Negative for fracture or focal lesion. Other: Small left frontal scalp contusion. CT MAXILLOFACIAL FINDINGS Osseous: No fracture or mandibular dislocation. No destructive process. Orbits: Negative. No traumatic or inflammatory finding. Sinuses: Clear.  Clear mastoid air cells and middle ear cavities. Soft tissues: No soft tissue mass or hematoma. No notable contusion. CT CERVICAL SPINE FINDINGS Alignment: Reversal the normal cervical lordosis, apex at C6. Mild degenerative anterolisthesis of C5 on C6 and minimally of C3 and C4 and C4 on C5. No traumatic subluxation. Skull base and vertebrae: No acute fracture. No primary bone lesion or focal pathologic process. Soft tissues and spinal canal: No prevertebral fluid or swelling. No visible canal hematoma. Disc levels: Moderate  loss of disc height, at all levels, least prominently involving C4-C5, most severe at C5-C6. Endplate spurring and mild spondylotic disc bulging at all levels except C4-C5. Bilateral facet degenerative changes are noted. No convincing disc herniation. Upper chest: No acute finding.  Clear lung apices. Other: None. IMPRESSION: HEAD CT 1. No acute intracranial abnormalities.  No skull fracture. 2. Atrophy  and mild chronic microvascular ischemic change. MAXILLOFACIAL CT 1. No fracture or acute finding. CERVICAL CT 1. No fracture or acute finding. Electronically Signed   By: Amie Portlandavid  Ormond M.D.   On: 12/22/2018 16:07    Assessment/Plan 1. Left leg cellulitis Afebrile.Left medial lateral upper leg redness, firm swelling with small Pimple with yellow- whitish fluid.Purple bruise to lower leg extending to foot/toes.FROM to foot non-tender to touch.  - start on Doxycycline 100 mg tablet one by mouth twice daily x 7 days  - Florastor 250 mg capsule one by mouth twice daily x 10 days to prevent antibiotic associated diarrhea.  - CBC with Differential/Platelet  2. Laceration of left forearm, sequela Wound bed red without any signs of infections.wound cleanse with saline,pat dry, covered with cut Xerofoam,nonadhesive gauze,4 X 4 gauze and secured with Kerlix, coban and tape.  3. Laceration of right leg excluding thigh, sequela Progressing wound cleanse with saline,pat dry, covered with cut Xerofoam,nonadhesive gauze,4 X 4 gauze and secured with Kerlix, coban and tape.  4. Tachycardia HR 113 b/min irregular.she has not taken her medication today stated takes around 10 AM but did not since she was coming for visit.she will take medication at home.  5. Pain and swelling of left ankle Malleolus redness and tender to palpation.Purple bruise noted down to foot/toes.  - DG Ankle Complete Left; Future  6. Pain and swelling of left lower leg Extensive purple bruise down to toes. - DG Tibia/Fibula Left;  Future  Family/ staff Communication: Reviewed plan of care with patient and daughter.  Labs/tests ordered:  - DG Tibia/Fibula Left; Future - DG Ankle Complete Left; Future  Caesar Bookmaninah C Deborahann Poteat, NP

## 2019-01-03 NOTE — Patient Instructions (Signed)
Notify provider if symptoms worsen or running fever > 100.5

## 2019-01-06 ENCOUNTER — Ambulatory Visit: Payer: No Typology Code available for payment source | Admitting: Family

## 2019-01-06 ENCOUNTER — Encounter: Payer: Self-pay | Admitting: Family

## 2019-01-06 ENCOUNTER — Ambulatory Visit (INDEPENDENT_AMBULATORY_CARE_PROVIDER_SITE_OTHER): Payer: Medicare Other | Admitting: Family

## 2019-01-06 ENCOUNTER — Other Ambulatory Visit: Payer: Self-pay

## 2019-01-06 VITALS — BP 128/78 | HR 78 | Temp 98.3°F | Resp 18 | Ht 63.0 in | Wt 147.0 lb

## 2019-01-06 DIAGNOSIS — L03116 Cellulitis of left lower limb: Secondary | ICD-10-CM | POA: Diagnosis not present

## 2019-01-06 DIAGNOSIS — S81811S Laceration without foreign body, right lower leg, sequela: Secondary | ICD-10-CM

## 2019-01-06 DIAGNOSIS — S51812S Laceration without foreign body of left forearm, sequela: Secondary | ICD-10-CM | POA: Diagnosis not present

## 2019-01-06 NOTE — Progress Notes (Signed)
Provider: Chelcea Zahn FNP-C  Sharon SellerEubanks, Jessica K, NP  Patient Care Team: Sharon SellerEubanks, Jessica K, NP as PCP - General (Geriatric Medicine) Sallye LatGroat, Christopher, MD as Consulting Physician (Ophthalmology) Newman Niparroll, Donna C, NP as Nurse Practitioner (Cardiology)  Extended Emergency Contact Information Primary Emergency Contact: [facility]Nancy Address: [address]          [place], [place] [address] [place] Home Phone: [phone] Mobile Phone: [phone] Relation: Daughter Secondary Emergency Contact: Vanice SarahLenk,Thomas  [place] Home Phone: [phone] Mobile Phone: [phone] Relation: Son  Code Status:  Goals of care: Advanced Directive information Advanced Directives 10/04/2018  Does Patient Have a Medical Advance Directive? Yes  Type of Advance Directive Living will  Does patient want to make changes to medical advance directive? No - Patient declined  Copy of Healthcare Power of Attorney in Chart? -  Would patient like information on creating a medical advance directive? -  Pre-existing out of facility DNR order (yellow form or pink MOST form) -     Chief Complaint  Patient presents with   Follow-up    cellulitis    HPI:  Pt is a 85 y.o. female seen today for an acute visit for evaluation of left leg cellulitis.she is here with her daughter Harriett Sineancy.she has been taking antibiotics as previous directed.she state leg swelling and bruise has improved.she states still has small area on cellulitis site with white pimple and hard around it but redness has improved.she denies any fever or chills.Her Heart rate was 113 previous visit but she had not taken her medication.she states took her blood pressure medication today. HR within normal range.      Past Medical History:  Diagnosis Date   A-fib (HCC) 05/2015   HOSPITALIZED    Arthritis    Cataract    Hypothyroidism    Permanent atrial fibrillation    Thyroid disease    Past Surgical  History:  Procedure Laterality Date   CARDIOVERSION N/A 06/08/2015   Procedure: CARDIOVERSION;  Surgeon: Jake BatheMark C Skains, MD;  [facility];  Service: Cardiovascular;  Laterality: N/A;   CARDIOVERSION N/A 09/02/2015   Procedure: CARDIOVERSION;  Surgeon: Laqueta LindenSuresh A Koneswaran, MD;  Location: [facility] ENDOSCOPY;  Service: Cardiovascular;  Laterality: N/A;   CATARACT EXTRACTION, BILATERAL Bilateral 2010   JOINT REPLACEMENT     KNEE SURGERY  2002   right hip replacement     RIGHT KNEE REPLACEMENT  2011   TEE WITHOUT CARDIOVERSION N/A 06/08/2015   Procedure: TRANSESOPHAGEAL ECHOCARDIOGRAM (TEE);  Surgeon: Jake BatheMark C Skains, MD;  [facility];  Service: Cardiovascular;  Laterality: N/A;   TOTAL HIP ARTHROPLASTY Right 2011   TOTAL SHOULDER REPLACEMENT Right 2003   TOTAL SHOULDER REPLACEMENT Left 2007   WRIST SURGERY Bilateral 2008   both wrists    Allergies  Allergen Reactions   Banana Nausea Only    All banana products also   Penicillins Hives    Outpatient Encounter Medications as of 01/06/2019  Medication Sig   ascorbic acid (VITAMIN C) 500 MG tablet Take 1,000 mg by mouth daily.   CARDIZEM LA 120 MG 24 hr tablet TAKE 1 TABLET (120 MG TOTAL) BY MOUTH 2 (TWO) TIMES DAILY.   cholecalciferol (VITAMIN D) 1000 units tablet Take 1,000 Units by mouth daily.   doxycycline (VIBRA-TABS) 100 MG tablet Take 1 tablet (100 mg total) by mouth 2 (two) times daily for 7 days.   ELIQUIS 5 MG TABS tablet TAKE 1 TABLET (5 MG TOTAL) BY MOUTH 2 (  TWO) TIMES DAILY.   furosemide (LASIX) 20 MG tablet TAKE 1 TABLET DAILY AS NEEDED FOR WEIGHT GAIN >3LBS.   metoprolol succinate (TOPROL-XL) 25 MG 24 hr tablet TAKE 1 TABLET BY MOUTH TWICE A DAY   Multiple Vitamin (MULTIVITAMIN) capsule Take 1 capsule by mouth daily.   saccharomyces boulardii (FLORASTOR) 250 MG capsule Take 1 capsule (250 mg total) by mouth 2 (two) times daily for 10 days.   SYNTHROID 125 MCG tablet TAKE 1 TABLET (125 MCG  TOTAL) BY MOUTH DAILY BEFORE BREAKFAST.   traMADol (ULTRAM) 50 MG tablet Take 1 tablet (50 mg total) by mouth every 8 (eight) hours as needed. for pain   vitamin E 400 UNIT capsule Take 400 Units by mouth daily.   No facility-administered encounter medications on file as of 01/06/2019.     Review of Systems  Constitutional: Negative for appetite change, chills, fatigue and fever.  Respiratory: Negative for cough, chest tightness, shortness of breath and wheezing.   Cardiovascular: Negative for chest pain and palpitations.       Left leg swelling has improved   Gastrointestinal: Negative for abdominal distention, abdominal pain, constipation, diarrhea, nausea and vomiting.  Musculoskeletal: Positive for gait problem.  Skin: Negative for color change, pallor and rash.       Left forearm and right leg dressing continues to be changed by Home health Nurse.No drainage.   Neurological: Negative for dizziness, light-headedness and headaches.  Psychiatric/Behavioral: Negative for agitation, confusion and sleep disturbance. The patient is not nervous/anxious.     Immunization History  Administered Date(s) Administered   Influenza Split 03/25/2012   Influenza, High Dose Seasonal PF 03/28/2018   Influenza,inj,Quad PF,6+ Mos 02/24/2013, 03/15/2015, 02/25/2016   Influenza-Unspecified 05/01/2014, 03/19/2017   Pneumococcal Conjugate-13 05/27/2014   Pneumococcal Polysaccharide-23 02/25/2016   Tdap 10/10/2016   Zoster 05/22/2001   Zoster Recombinat (Shingrix) 08/31/2017, 11/07/2017   Pertinent  Health Maintenance Due  Topic Date Due   INFLUENZA VACCINE  12/21/2018   DEXA SCAN  Completed   PNA vac Low Risk Adult  Completed   Fall Risk  01/03/2019 12/23/2018 10/04/2018 06/19/2018 02/15/2018  Falls in the past year? 1 1 0 0 No  Number falls in past yr: 1 1 0 0 -  Injury with Fall? 1 1 0 0 -    Vitals:   01/06/19 1049  BP: 128/78  Temp: 98.3 F (36.8 C)  TempSrc: Oral  Weight: 147  lb (66.7 kg)  Height: 5\' 3"  (1.6 m)   Body mass index is 26.04 kg/m. Physical Exam Constitutional:      General: She is not in acute distress.    Appearance: She is overweight. She is not ill-appearing.  HENT:     Head: Normocephalic.  Eyes:     General: No scleral icterus.       Right eye: No discharge.        Left eye: No discharge.     Conjunctiva/sclera: Conjunctivae normal.     Pupils: Pupils are equal, round, and reactive to light.  Cardiovascular:     Rate and Rhythm: Normal rate. Rhythm irregular.     Pulses: Normal pulses.     Heart sounds: Normal heart sounds. No murmur. No friction rub. No gallop.   Pulmonary:     Effort: Pulmonary effort is normal. No respiratory distress.     Breath sounds: Normal breath sounds. No wheezing, rhonchi or rales.  Chest:     Chest wall: No tenderness.  Abdominal:  General: Bowel sounds are normal. There is no distension.     Palpations: Abdomen is soft. There is no mass.     Tenderness: There is no abdominal tenderness. There is no right CVA tenderness, left CVA tenderness, guarding or rebound.  Musculoskeletal:        General: No tenderness.     Right lower leg: No edema.     Left lower leg: No edema.     Comments: Unsteady gait ambulates with right hand cane.Left leg trace -1+ edema.  Skin:    General: Skin is warm and dry.     Coloration: Skin is not pale.     Findings: No erythema.     Comments: 1. Left forearm previous skin laceration site progressive healing noted.No drainage.site cleansed with saline,pat,covered with xerofoam,non adhesive 4X4 and secured with Kerlix and coban for protection.  2. Right leg laceration progressive healing.Wound bed without any signs of infections.Non-tender to touch.wound cleanse with saline,pat,covered with xerofoam,non adhesive 4X4 and secured with Kerlix and coban for protection.  3. Left upper leg medial cellulitis with reduce abscess whitish papule noted with surrounding skin redness  slightly tender touch.overall previous purple bruise have improved and resolved on much of the leg except the toes.  Neurological:     Mental Status: She is alert and oriented to person, place, and time.     Cranial Nerves: No cranial nerve deficit.     Sensory: No sensory deficit.     Motor: No weakness.     Coordination: Coordination normal.     Gait: Gait abnormal.  Psychiatric:        Mood and Affect: Mood normal.        Behavior: Behavior normal.        Thought Content: Thought content normal.        Judgment: Judgment normal.    Labs reviewed: Recent Labs    06/19/18 1106  NA 140  K 5.1  CL 103  CO2 29  GLUCOSE 108  BUN 20  CREATININE 0.78  CALCIUM 10.5*   Recent Labs    06/19/18 1106  AST 14  ALT 7  BILITOT 0.6  PROT 6.8   Recent Labs    06/19/18 1106 01/03/19 1127  WBC 6.1 8.6  NEUTROABS 4,032 6,502  HGB 13.9 12.7  HCT 42.5 38.5  MCV 89.7 89.7  PLT 323 467*   Lab Results  Component Value Date   TSH 0.40 06/19/2018   No results found for: HGBA1C Lab Results  Component Value Date   CHOL 193 05/27/2014   HDL 66 05/27/2014   LDLCALC 106 (H) 05/27/2014   TRIG 107 05/27/2014   CHOLHDL 2.9 05/27/2014    Significant Diagnostic Results in last 30 days:  Dg Tibia/fibula Left  Result Date: 01/03/2019 CLINICAL DATA:  Injury, pain and bruising EXAM: LEFT TIBIA AND FIBULA - 2 VIEW COMPARISON:  None. FINDINGS: Bones are osteopenic. Previous left knee arthroplasty. Components appear aligned. No acute osseous finding fracture or malalignment. Soft tissue bruising suspected of the left lower extremity in the medial tibial region. Peripheral atherosclerosis noted. IMPRESSION: Soft tissue injury/bruising proximal left tibial region medially. Osteopenia and postoperative findings as above No acute osseous finding Peripheral atherosclerosis Electronically Signed   By: Jerilynn Mages.  Shick M.D.   On: 01/03/2019 17:13   Dg Ankle Complete Left  Result Date: 01/03/2019 CLINICAL  DATA:  Recent fall, injury, pain EXAM: LEFT ANKLE COMPLETE - 3+ VIEW COMPARISON:  01/03/2019 FINDINGS: Osteopenia and degenerative changes. Malleoli,  talus and calcaneus appear intact. No acute osseous finding or fracture. Mild soft tissue swelling. IMPRESSION: Osteopenia and degenerative change.  No acute osseous finding. Electronically Signed   By: Judie Petit.  Shick M.D.   On: 01/03/2019 17:14   Ct Head Wo Contrast  Result Date: 12/22/2018 CLINICAL DATA:  Tripped and fell forward striking her forehead and the left side of her face. Denies loss of consciousness. EXAM: CT HEAD WITHOUT CONTRAST CT MAXILLOFACIAL WITHOUT CONTRAST CT CERVICAL SPINE WITHOUT CONTRAST TECHNIQUE: Multidetector CT imaging of the head, cervical spine, and maxillofacial structures were performed using the standard protocol without intravenous contrast. Multiplanar CT image reconstructions of the cervical spine and maxillofacial structures were also generated. COMPARISON:  None. FINDINGS: CT HEAD FINDINGS Brain: No evidence of acute infarction, hemorrhage, hydrocephalus, extra-axial collection or mass lesion/mass effect. There is ventricular and sulcal enlargement reflecting age-appropriate volume loss. Mild periventricular white matter hypoattenuation is present consistent with chronic microvascular ischemic change. Vascular: No hyperdense vessel or unexpected calcification. Skull: Normal. Negative for fracture or focal lesion. Other: Small left frontal scalp contusion. CT MAXILLOFACIAL FINDINGS Osseous: No fracture or mandibular dislocation. No destructive process. Orbits: Negative. No traumatic or inflammatory finding. Sinuses: Clear.  Clear mastoid air cells and middle ear cavities. Soft tissues: No soft tissue mass or hematoma. No notable contusion. CT CERVICAL SPINE FINDINGS Alignment: Reversal the normal cervical lordosis, apex at C6. Mild degenerative anterolisthesis of C5 on C6 and minimally of C3 and C4 and C4 on C5. No traumatic  subluxation. Skull base and vertebrae: No acute fracture. No primary bone lesion or focal pathologic process. Soft tissues and spinal canal: No prevertebral fluid or swelling. No visible canal hematoma. Disc levels: Moderate loss of disc height, at all levels, least prominently involving C4-C5, most severe at C5-C6. Endplate spurring and mild spondylotic disc bulging at all levels except C4-C5. Bilateral facet degenerative changes are noted. No convincing disc herniation. Upper chest: No acute finding.  Clear lung apices. Other: None. IMPRESSION: HEAD CT 1. No acute intracranial abnormalities.  No skull fracture. 2. Atrophy and mild chronic microvascular ischemic change. MAXILLOFACIAL CT 1. No fracture or acute finding. CERVICAL CT 1. No fracture or acute finding. Electronically Signed   By: Amie Portland M.D.   On: 12/22/2018 16:07   Ct Cervical Spine Wo Contrast  Result Date: 12/22/2018 CLINICAL DATA:  Tripped and fell forward striking her forehead and the left side of her face. Denies loss of consciousness. EXAM: CT HEAD WITHOUT CONTRAST CT MAXILLOFACIAL WITHOUT CONTRAST CT CERVICAL SPINE WITHOUT CONTRAST TECHNIQUE: Multidetector CT imaging of the head, cervical spine, and maxillofacial structures were performed using the standard protocol without intravenous contrast. Multiplanar CT image reconstructions of the cervical spine and maxillofacial structures were also generated. COMPARISON:  None. FINDINGS: CT HEAD FINDINGS Brain: No evidence of acute infarction, hemorrhage, hydrocephalus, extra-axial collection or mass lesion/mass effect. There is ventricular and sulcal enlargement reflecting age-appropriate volume loss. Mild periventricular white matter hypoattenuation is present consistent with chronic microvascular ischemic change. Vascular: No hyperdense vessel or unexpected calcification. Skull: Normal. Negative for fracture or focal lesion. Other: Small left frontal scalp contusion. CT MAXILLOFACIAL  FINDINGS Osseous: No fracture or mandibular dislocation. No destructive process. Orbits: Negative. No traumatic or inflammatory finding. Sinuses: Clear.  Clear mastoid air cells and middle ear cavities. Soft tissues: No soft tissue mass or hematoma. No notable contusion. CT CERVICAL SPINE FINDINGS Alignment: Reversal the normal cervical lordosis, apex at C6. Mild degenerative anterolisthesis of C5 on C6 and minimally of  C3 and C4 and C4 on C5. No traumatic subluxation. Skull base and vertebrae: No acute fracture. No primary bone lesion or focal pathologic process. Soft tissues and spinal canal: No prevertebral fluid or swelling. No visible canal hematoma. Disc levels: Moderate loss of disc height, at all levels, least prominently involving C4-C5, most severe at C5-C6. Endplate spurring and mild spondylotic disc bulging at all levels except C4-C5. Bilateral facet degenerative changes are noted. No convincing disc herniation. Upper chest: No acute finding.  Clear lung apices. Other: None. IMPRESSION: HEAD CT 1. No acute intracranial abnormalities.  No skull fracture. 2. Atrophy and mild chronic microvascular ischemic change. MAXILLOFACIAL CT 1. No fracture or acute finding. CERVICAL CT 1. No fracture or acute finding. Electronically Signed   By: Amie Portland M.D.   On: 12/22/2018 16:07   Dg Hand Complete Left  Result Date: 12/22/2018 CLINICAL DATA:  Fall.  Left fifth finger swollen and black and blue. EXAM: LEFT HAND - COMPLETE 3+ VIEW COMPARISON:  None. FINDINGS: Subtle nondisplaced fracture across the radial margin of the distal aspect of the fifth finger proximal phalanx. No other convincing fracture. There are advanced arthropathic changes. Marked joint space narrowing with small marginal osteophytes and subchondral cystic change and no erosions are noted involving the DIP and PIP joints of all fingers, mildly involving the IP joint of the thumb. There is milder asymmetric joint space narrowing of the PIP  joints most evident of the fifth PIP joint. Joint space narrowing with mild subchondral sclerosis is noted at the scaphoid, trapezium trapezoid articulation and the trapezium first metacarpal articulation. Bones are diffusely demineralized. There is fifth finger soft tissue swelling. IMPRESSION: 1. Subtle nondisplaced fracture of the distal aspect of the proximal phalanx of the fifth finger, across the radial corner. 2. No other fractures. 3. 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 Electronically Signed   By: Amie Portland M.D.   On: 12/22/2018 16:33   Ct Maxillofacial Wo Contrast  Result Date: 12/22/2018 CLINICAL DATA:  Tripped and fell forward striking her forehead and the left side of her face. Denies loss of consciousness. EXAM: CT HEAD WITHOUT CONTRAST CT MAXILLOFACIAL WITHOUT CONTRAST CT CERVICAL SPINE WITHOUT CONTRAST TECHNIQUE: Multidetector CT imaging of the head, cervical spine, and maxillofacial structures were performed using the standard protocol without intravenous contrast. Multiplanar CT image reconstructions of the cervical spine and maxillofacial structures were also generated. COMPARISON:  None. FINDINGS: CT HEAD FINDINGS Brain: No evidence of acute infarction, hemorrhage, hydrocephalus, extra-axial collection or mass lesion/mass effect. There is ventricular and sulcal enlargement reflecting age-appropriate volume loss. Mild periventricular white matter hypoattenuation is present consistent with chronic microvascular ischemic change. Vascular: No hyperdense vessel or unexpected calcification. Skull: Normal. Negative for fracture or focal lesion. Other: Small left frontal scalp contusion. CT MAXILLOFACIAL FINDINGS Osseous: No fracture or mandibular dislocation. No destructive process. Orbits: Negative. No traumatic or inflammatory finding. Sinuses: Clear.  Clear mastoid air cells and middle ear cavities. Soft tissues: No soft tissue mass or hematoma. No notable  contusion. CT CERVICAL SPINE FINDINGS Alignment: Reversal the normal cervical lordosis, apex at C6. Mild degenerative anterolisthesis of C5 on C6 and minimally of C3 and C4 and C4 on C5. No traumatic subluxation. Skull base and vertebrae: No acute fracture. No primary bone lesion or focal pathologic process. Soft tissues and spinal canal: No prevertebral fluid or swelling. No visible canal hematoma. Disc levels: Moderate loss of disc height, at all levels, least prominently involving C4-C5, most severe at C5-C6. Endplate  spurring and mild spondylotic disc bulging at all levels except C4-C5. Bilateral facet degenerative changes are noted. No convincing disc herniation. Upper chest: No acute finding.  Clear lung apices. Other: None. IMPRESSION: HEAD CT 1. No acute intracranial abnormalities.  No skull fracture. 2. Atrophy and mild chronic microvascular ischemic change. MAXILLOFACIAL CT 1. No fracture or acute finding. CERVICAL CT 1. No fracture or acute finding. Electronically Signed   By: Amie Portlandavid  Ormond M.D.   On: 12/22/2018 16:07    Assessment/Plan 1. Left leg cellulitis Afebrile. Left upper leg medial cellulitis with reduce abscess whitish papule noted with surrounding skin redness slightly tender touch.overall previous purple bruise have improved and resolved on much of the leg except the toes.Instructed to apply warm compressor to abscess area twice daily for 5-10 minutes. - continue on Doxycycline 100 mg tablet twice daily as directed along with probiotics. -Notify provider if symptoms worsen or not improved.   2. Laceration of left forearm, sequela  Left forearm previous skin laceration site progressive healing noted.No drainage.site cleansed with saline,pat,covered with xerofoam,non adhesive 4X4 and secured with Kerlix and coban for protection.HH Nurse to continue with current dressing changes until completely healed.  3. Laceration of right leg excluding thigh, sequela  progressive healing.Wound  bed without any signs of infections.Non-tender to touch.wound cleanse with saline,pat,covered with xerofoam,non adhesive 4X4 and secured with Kerlix and coban for protection.HH Nurse to continue with current dressing changes until completely healed.  Family/ staff Communication: Reviewed plan of care with patient and daughter.  Labs/tests ordered: None   Next Visit : as needed   Caesar Bookmaninah C Dejuana Weist, NP

## 2019-01-06 NOTE — Patient Instructions (Signed)
1. Continue with Doxycyline 100 mg tablet twice daily and probiotics. 2. Apply warm compressor  to left upper leg abscess.Notify provider's office if symptoms worsen or not resolve.  3. Home health Nurse to continue with dressing changes to left forearm and right leg.

## 2019-01-09 DIAGNOSIS — M48062 Spinal stenosis, lumbar region with neurogenic claudication: Secondary | ICD-10-CM | POA: Diagnosis not present

## 2019-01-09 DIAGNOSIS — S81812D Laceration without foreign body, left lower leg, subsequent encounter: Secondary | ICD-10-CM | POA: Diagnosis not present

## 2019-01-09 DIAGNOSIS — S62647D Nondisplaced fracture of proximal phalanx of left little finger, subsequent encounter for fracture with routine healing: Secondary | ICD-10-CM | POA: Diagnosis not present

## 2019-01-09 DIAGNOSIS — S51812D Laceration without foreign body of left forearm, subsequent encounter: Secondary | ICD-10-CM | POA: Diagnosis not present

## 2019-01-09 DIAGNOSIS — M150 Primary generalized (osteo)arthritis: Secondary | ICD-10-CM | POA: Diagnosis not present

## 2019-01-09 DIAGNOSIS — E890 Postprocedural hypothyroidism: Secondary | ICD-10-CM | POA: Diagnosis not present

## 2019-01-13 DIAGNOSIS — E890 Postprocedural hypothyroidism: Secondary | ICD-10-CM | POA: Diagnosis not present

## 2019-01-13 DIAGNOSIS — S81812D Laceration without foreign body, left lower leg, subsequent encounter: Secondary | ICD-10-CM | POA: Diagnosis not present

## 2019-01-13 DIAGNOSIS — S51812D Laceration without foreign body of left forearm, subsequent encounter: Secondary | ICD-10-CM | POA: Diagnosis not present

## 2019-01-13 DIAGNOSIS — M150 Primary generalized (osteo)arthritis: Secondary | ICD-10-CM | POA: Diagnosis not present

## 2019-01-13 DIAGNOSIS — M48062 Spinal stenosis, lumbar region with neurogenic claudication: Secondary | ICD-10-CM | POA: Diagnosis not present

## 2019-01-13 DIAGNOSIS — S62647D Nondisplaced fracture of proximal phalanx of left little finger, subsequent encounter for fracture with routine healing: Secondary | ICD-10-CM | POA: Diagnosis not present

## 2019-01-14 DIAGNOSIS — S62647D Nondisplaced fracture of proximal phalanx of left little finger, subsequent encounter for fracture with routine healing: Secondary | ICD-10-CM | POA: Diagnosis not present

## 2019-01-14 DIAGNOSIS — S81812D Laceration without foreign body, left lower leg, subsequent encounter: Secondary | ICD-10-CM | POA: Diagnosis not present

## 2019-01-14 DIAGNOSIS — M150 Primary generalized (osteo)arthritis: Secondary | ICD-10-CM | POA: Diagnosis not present

## 2019-01-14 DIAGNOSIS — E890 Postprocedural hypothyroidism: Secondary | ICD-10-CM | POA: Diagnosis not present

## 2019-01-14 DIAGNOSIS — M48062 Spinal stenosis, lumbar region with neurogenic claudication: Secondary | ICD-10-CM | POA: Diagnosis not present

## 2019-01-14 DIAGNOSIS — S51812D Laceration without foreign body of left forearm, subsequent encounter: Secondary | ICD-10-CM | POA: Diagnosis not present

## 2019-01-15 ENCOUNTER — Encounter: Payer: Self-pay | Admitting: Family

## 2019-01-15 ENCOUNTER — Ambulatory Visit (INDEPENDENT_AMBULATORY_CARE_PROVIDER_SITE_OTHER): Payer: Medicare Other | Admitting: Family

## 2019-01-15 ENCOUNTER — Other Ambulatory Visit: Payer: Self-pay

## 2019-01-15 VITALS — BP 98/90 | HR 82 | Temp 98.0°F | Resp 20 | Ht 63.0 in | Wt 147.0 lb

## 2019-01-15 DIAGNOSIS — R609 Edema, unspecified: Secondary | ICD-10-CM | POA: Diagnosis not present

## 2019-01-15 DIAGNOSIS — L03116 Cellulitis of left lower limb: Secondary | ICD-10-CM

## 2019-01-15 MED ORDER — SACCHAROMYCES BOULARDII 250 MG PO CAPS: 250.0000 mg | ORAL_CAPSULE | Freq: Two times a day (BID) | ORAL | 0 refills | Status: AC

## 2019-01-15 MED ORDER — DOXYCYCLINE HYCLATE 100 MG PO TABS: 100.0000 mg | ORAL_TABLET | Freq: Two times a day (BID) | ORAL | 0 refills | Status: AC

## 2019-01-15 NOTE — Progress Notes (Signed)
Provider: Coren Crownover FNP-C  Sharon Seller, NP  Patient Care Team: Sharon Seller, NP as PCP - General (Geriatric Medicine) Sallye Lat, MD as Consulting Physician (Ophthalmology) Newman Nip, NP as Nurse Practitioner (Cardiology)  Extended Emergency Contact Information Primary Emergency Contact: [facility] Address: [address] [place], [place] [address] [place] of [place] Home Phone: [phone] Mobile Phone: [phone] Relation: Daughter Secondary Emergency Contact: Vanice Sarah [place] of [place] Home Phone: [phone] Mobile Phone: [phone] Relation: Son  Code Status:  DNR Goals of care: Advanced Directive information Advanced Directives 10/04/2018  Does Patient Have a Medical Advance Directive? Yes  Type of Advance Directive Living will  Does patient want to make changes to medical advance directive? No - Patient declined  Copy of Healthcare Power of Attorney in Chart? -  Would patient like information on creating a medical advance directive? -  Pre-existing out of facility DNR order (yellow form or pink MOST form) -     Chief Complaint  Patient presents with   Follow-up    Lump on left leg and may be infected    HPI:  Pt is a 85 y.o. female seen today for an acute visit for evaluation of left leg swelling.she states completed previous prescribed oral antibiotics for left upper leg cellulitis.she states abscess improved but still has small area that looks like a pimple that was going to open.Daughter states pimple seems to be improved today.she still has some tenderness around.she denies any fever,chills and no drainage noted.she is status post fall and had extensive bruising on the leg down to the foot.Bruises have resolved except small areas on the toes still has fading purple color. Home health Nurse continues to change dressing on the left fore arm skin tear and right leg laceration.Both areas healing well.     Past Medical History:  Diagnosis Date   A-fib (HCC) 05/2015   HOSPITALIZED    Arthritis    Cataract    Hypothyroidism    Permanent atrial fibrillation    Thyroid disease    Past Surgical History:  Procedure Laterality Date   CARDIOVERSION N/A 06/08/2015   Procedure: CARDIOVERSION;  Surgeon: Jake Bathe, MD;  Location: [facility] ENDOSCOPY;  Service: Cardiovascular;  Laterality: N/A;   CARDIOVERSION N/A 09/02/2015   Procedure: CARDIOVERSION;  Surgeon: Laqueta Linden, MD;  Location: [facility] ENDOSCOPY;  Service: Cardiovascular;  Laterality: N/A;   CATARACT EXTRACTION, BILATERAL Bilateral 2010   JOINT REPLACEMENT     KNEE SURGERY  2002   right hip replacement     RIGHT KNEE REPLACEMENT  2011   TEE WITHOUT CARDIOVERSION N/A 06/08/2015   Procedure: TRANSESOPHAGEAL ECHOCARDIOGRAM (TEE);  Surgeon: Jake Bathe, MD;  Location: [facility] ENDOSCOPY;  Service: Cardiovascular;  Laterality: N/A;   TOTAL HIP ARTHROPLASTY Right 2011   TOTAL SHOULDER REPLACEMENT Right 2003   TOTAL SHOULDER REPLACEMENT Left 2007   WRIST SURGERY Bilateral 2008   both wrists    Allergies  Allergen Reactions   Banana Nausea Only    All banana products also   Penicillins Hives    Outpatient Encounter Medications as of 01/15/2019  Medication Sig   ascorbic acid (VITAMIN C) 500 MG tablet Take 1,000 mg by mouth daily.   CARDIZEM LA 120 MG 24 hr tablet TAKE 1 TABLET (120 MG TOTAL) BY MOUTH 2 (TWO) TIMES DAILY.   cholecalciferol (VITAMIN D) 1000 units tablet Take 1,000 Units by mouth daily.   ELIQUIS  5 MG TABS tablet TAKE 1 TABLET (5 MG TOTAL) BY MOUTH 2 (TWO) TIMES DAILY.   furosemide (LASIX) 20 MG tablet TAKE 1 TABLET DAILY AS NEEDED FOR WEIGHT GAIN >3LBS.   metoprolol succinate (TOPROL-XL) 25 MG 24 hr tablet TAKE 1 TABLET BY MOUTH TWICE A DAY   Multiple Vitamin (MULTIVITAMIN) capsule Take 1 capsule by mouth daily.   SYNTHROID 125 MCG tablet TAKE 1 TABLET (125 MCG TOTAL) BY MOUTH DAILY BEFORE  BREAKFAST.   traMADol (ULTRAM) 50 MG tablet Take 1 tablet (50 mg total) by mouth every 8 (eight) hours as needed. for pain   vitamin E 400 UNIT capsule Take 400 Units by mouth daily.   No facility-administered encounter medications on file as of 01/15/2019.     Review of Systems  Constitutional: Negative for chills, fatigue and fever.  Respiratory: Negative for cough, chest tightness, shortness of breath and wheezing.   Cardiovascular: Positive for leg swelling. Negative for chest pain and palpitations.  Gastrointestinal: Negative for constipation, diarrhea, nausea and vomiting.  Musculoskeletal: Positive for gait problem. Negative for myalgias.  Skin: Negative for color change, pallor and rash.       Left leg abscess cellulitis has improved.   Neurological: Negative for dizziness, weakness, numbness and headaches.  Psychiatric/Behavioral: Negative for agitation, confusion and sleep disturbance. The patient is not nervous/anxious.     Immunization History  Administered Date(s) Administered   Influenza Split 03/25/2012   Influenza, High Dose Seasonal PF 03/28/2018   Influenza,inj,Quad PF,6+ Mos 02/24/2013, 03/15/2015, 02/25/2016   Influenza-Unspecified 05/01/2014, 03/19/2017   Pneumococcal Conjugate-13 05/27/2014   Pneumococcal Polysaccharide-23 02/25/2016   Tdap 10/10/2016   Zoster 05/22/2001   Zoster Recombinat (Shingrix) 08/31/2017, 11/07/2017   Pertinent  Health Maintenance Due  Topic Date Due   INFLUENZA VACCINE  12/21/2018   DEXA SCAN  Completed   PNA vac Low Risk Adult  Completed   Fall Risk  01/15/2019 01/03/2019 12/23/2018 10/04/2018 06/19/2018  Falls in the past year? 1 1 1  0 0  Number falls in past yr: 0 1 1 0 0  Injury with Fall? 1 1 1  0 0  Comment Stiches and brused boy skni tears on arm and leg and broke pinky - - - -    Vitals:   01/15/19 1436  BP: 98/90  Pulse: 82  Resp: 20  Temp: 98 F (36.7 C)  TempSrc: Oral  SpO2: 96%  Weight: 147 lb (66.7  kg)  Height: 5\' 3"  (1.6 m)   Body mass index is 26.04 kg/m. Physical Exam Constitutional:      General: She is not in acute distress.    Appearance: She is overweight. She is not ill-appearing.  Eyes:     General: No scleral icterus.       Right eye: No discharge.        Left eye: No discharge.     Conjunctiva/sclera: Conjunctivae normal.     Pupils: Pupils are equal, round, and reactive to light.  Cardiovascular:     Rate and Rhythm: Normal rate. Rhythm irregular.     Pulses: Normal pulses.     Heart sounds: No murmur. No friction rub. No gallop.   Pulmonary:     Effort: Pulmonary effort is normal. No respiratory distress.     Breath sounds: Normal breath sounds. No wheezing, rhonchi or rales.  Chest:     Chest wall: No tenderness.  Abdominal:     General: Bowel sounds are normal. There is no distension.  Palpations: Abdomen is soft. There is no mass.     Tenderness: There is no abdominal tenderness. There is no right CVA tenderness, left CVA tenderness, guarding or rebound.  Musculoskeletal:        General: No tenderness.     Left lower leg: Edema present.     Comments: Walks with a cane   Skin:    General: Skin is warm and dry.     Coloration: Skin is not pale.     Findings: No rash.     Comments: Left upper medial leg previous abscess site redness has improved.slight tender to touch.No drainage noted.trace -1+ edema to entire leg noted.   Neurological:     Mental Status: She is alert and oriented to person, place, and time.     Sensory: No sensory deficit.     Motor: No weakness.     Coordination: Coordination normal.     Gait: Gait abnormal.  Psychiatric:        Mood and Affect: Mood normal.        Behavior: Behavior normal.        Thought Content: Thought content normal.        Judgment: Judgment normal.    Labs reviewed: Recent Labs    06/19/18 1106  NA 140  K 5.1  CL 103  CO2 29  GLUCOSE 108  BUN 20  CREATININE 0.78  CALCIUM 10.5*   Recent  Labs    06/19/18 1106  AST 14  ALT 7  BILITOT 0.6  PROT 6.8   Recent Labs    06/19/18 1106 01/03/19 1127  WBC 6.1 8.6  NEUTROABS 4,032 6,502  HGB 13.9 12.7  HCT 42.5 38.5  MCV 89.7 89.7  PLT 323 467*   Lab Results  Component Value Date   TSH 0.40 06/19/2018   No results found for: HGBA1C Lab Results  Component Value Date   CHOL 193 05/27/2014   HDL 66 05/27/2014   LDLCALC 106 (H) 05/27/2014   TRIG 107 05/27/2014   CHOLHDL 2.9 05/27/2014    Significant Diagnostic Results in last 30 days:  Dg Tibia/fibula Left  Result Date: 01/03/2019 CLINICAL DATA:  Injury, pain and bruising EXAM: LEFT TIBIA AND FIBULA - 2 VIEW COMPARISON:  None. FINDINGS: Bones are osteopenic. Previous left knee arthroplasty. Components appear aligned. No acute osseous finding fracture or malalignment. Soft tissue bruising suspected of the left lower extremity in the medial tibial region. Peripheral atherosclerosis noted. IMPRESSION: Soft tissue injury/bruising proximal left tibial region medially. Osteopenia and postoperative findings as above No acute osseous finding Peripheral atherosclerosis Electronically Signed   By: Jerilynn Mages.  Shick M.D.   On: 01/03/2019 17:13   Dg Ankle Complete Left  Result Date: 01/03/2019 CLINICAL DATA:  Recent fall, injury, pain EXAM: LEFT ANKLE COMPLETE - 3+ VIEW COMPARISON:  01/03/2019 FINDINGS: Osteopenia and degenerative changes. Malleoli, talus and calcaneus appear intact. No acute osseous finding or fracture. Mild soft tissue swelling. IMPRESSION: Osteopenia and degenerative change.  No acute osseous finding. Electronically Signed   By: Jerilynn Mages.  Shick M.D.   On: 01/03/2019 17:14   Ct Head Wo Contrast  Result Date: 12/22/2018 CLINICAL DATA:  Tripped and fell forward striking her forehead and the left side of her face. Denies loss of consciousness. EXAM: CT HEAD WITHOUT CONTRAST CT MAXILLOFACIAL WITHOUT CONTRAST CT CERVICAL SPINE WITHOUT CONTRAST TECHNIQUE: Multidetector CT imaging of  the head, cervical spine, and maxillofacial structures were performed using the standard protocol without intravenous contrast. Multiplanar CT  image reconstructions of the cervical spine and maxillofacial structures were also generated. COMPARISON:  None. FINDINGS: CT HEAD FINDINGS Brain: No evidence of acute infarction, hemorrhage, hydrocephalus, extra-axial collection or mass lesion/mass effect. There is ventricular and sulcal enlargement reflecting age-appropriate volume loss. Mild periventricular white matter hypoattenuation is present consistent with chronic microvascular ischemic change. Vascular: No hyperdense vessel or unexpected calcification. Skull: Normal. Negative for fracture or focal lesion. Other: Small left frontal scalp contusion. CT MAXILLOFACIAL FINDINGS Osseous: No fracture or mandibular dislocation. No destructive process. Orbits: Negative. No traumatic or inflammatory finding. Sinuses: Clear.  Clear mastoid air cells and middle ear cavities. Soft tissues: No soft tissue mass or hematoma. No notable contusion. CT CERVICAL SPINE FINDINGS Alignment: Reversal the normal cervical lordosis, apex at C6. Mild degenerative anterolisthesis of C5 on C6 and minimally of C3 and C4 and C4 on C5. No traumatic subluxation. Skull base and vertebrae: No acute fracture. No primary bone lesion or focal pathologic process. Soft tissues and spinal canal: No prevertebral fluid or swelling. No visible canal hematoma. Disc levels: Moderate loss of disc height, at all levels, least prominently involving C4-C5, most severe at C5-C6. Endplate spurring and mild spondylotic disc bulging at all levels except C4-C5. Bilateral facet degenerative changes are noted. No convincing disc herniation. Upper chest: No acute finding.  Clear lung apices. Other: None. IMPRESSION: HEAD CT 1. No acute intracranial abnormalities.  No skull fracture. 2. Atrophy and mild chronic microvascular ischemic change. MAXILLOFACIAL CT 1. No fracture  or acute finding. CERVICAL CT 1. No fracture or acute finding. Electronically Signed   By: Amie Portland M.D.   On: 12/22/2018 16:07   Ct Cervical Spine Wo Contrast  Result Date: 12/22/2018 CLINICAL DATA:  Tripped and fell forward striking her forehead and the left side of her face. Denies loss of consciousness. EXAM: CT HEAD WITHOUT CONTRAST CT MAXILLOFACIAL WITHOUT CONTRAST CT CERVICAL SPINE WITHOUT CONTRAST TECHNIQUE: Multidetector CT imaging of the head, cervical spine, and maxillofacial structures were performed using the standard protocol without intravenous contrast. Multiplanar CT image reconstructions of the cervical spine and maxillofacial structures were also generated. COMPARISON:  None. FINDINGS: CT HEAD FINDINGS Brain: No evidence of acute infarction, hemorrhage, hydrocephalus, extra-axial collection or mass lesion/mass effect. There is ventricular and sulcal enlargement reflecting age-appropriate volume loss. Mild periventricular white matter hypoattenuation is present consistent with chronic microvascular ischemic change. Vascular: No hyperdense vessel or unexpected calcification. Skull: Normal. Negative for fracture or focal lesion. Other: Small left frontal scalp contusion. CT MAXILLOFACIAL FINDINGS Osseous: No fracture or mandibular dislocation. No destructive process. Orbits: Negative. No traumatic or inflammatory finding. Sinuses: Clear.  Clear mastoid air cells and middle ear cavities. Soft tissues: No soft tissue mass or hematoma. No notable contusion. CT CERVICAL SPINE FINDINGS Alignment: Reversal the normal cervical lordosis, apex at C6. Mild degenerative anterolisthesis of C5 on C6 and minimally of C3 and C4 and C4 on C5. No traumatic subluxation. Skull base and vertebrae: No acute fracture. No primary bone lesion or focal pathologic process. Soft tissues and spinal canal: No prevertebral fluid or swelling. No visible canal hematoma. Disc levels: Moderate loss of disc height, at all  levels, least prominently involving C4-C5, most severe at C5-C6. Endplate spurring and mild spondylotic disc bulging at all levels except C4-C5. Bilateral facet degenerative changes are noted. No convincing disc herniation. Upper chest: No acute finding.  Clear lung apices. Other: None. IMPRESSION: HEAD CT 1. No acute intracranial abnormalities.  No skull fracture. 2. Atrophy and mild chronic  microvascular ischemic change. MAXILLOFACIAL CT 1. No fracture or acute finding. CERVICAL CT 1. No fracture or acute finding. Electronically Signed   By: Amie Portland M.D.   On: 12/22/2018 16:07   Dg Hand Complete Left  Result Date: 12/22/2018 CLINICAL DATA:  Fall.  Left fifth finger swollen and black and blue. EXAM: LEFT HAND - COMPLETE 3+ VIEW COMPARISON:  None. FINDINGS: Subtle nondisplaced fracture across the radial margin of the distal aspect of the fifth finger proximal phalanx. No other convincing fracture. There are advanced arthropathic changes. Marked joint space narrowing with small marginal osteophytes and subchondral cystic change and no erosions are noted involving the DIP and PIP joints of all fingers, mildly involving the IP joint of the thumb. There is milder asymmetric joint space narrowing of the PIP joints most evident of the fifth PIP joint. Joint space narrowing with mild subchondral sclerosis is noted at the scaphoid, trapezium trapezoid articulation and the trapezium first metacarpal articulation. Bones are diffusely demineralized. There is fifth finger soft tissue swelling. IMPRESSION: 1. Subtle nondisplaced fracture of the distal aspect of the proximal phalanx of the fifth finger, across the radial corner. 2. No other fractures. 3. 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 Electronically Signed   By: Amie Portland M.D.   On: 12/22/2018 16:33   Ct Maxillofacial Wo Contrast  Result Date: 12/22/2018 CLINICAL DATA:  Tripped and fell forward striking her forehead and the  left side of her face. Denies loss of consciousness. EXAM: CT HEAD WITHOUT CONTRAST CT MAXILLOFACIAL WITHOUT CONTRAST CT CERVICAL SPINE WITHOUT CONTRAST TECHNIQUE: Multidetector CT imaging of the head, cervical spine, and maxillofacial structures were performed using the standard protocol without intravenous contrast. Multiplanar CT image reconstructions of the cervical spine and maxillofacial structures were also generated. COMPARISON:  None. FINDINGS: CT HEAD FINDINGS Brain: No evidence of acute infarction, hemorrhage, hydrocephalus, extra-axial collection or mass lesion/mass effect. There is ventricular and sulcal enlargement reflecting age-appropriate volume loss. Mild periventricular white matter hypoattenuation is present consistent with chronic microvascular ischemic change. Vascular: No hyperdense vessel or unexpected calcification. Skull: Normal. Negative for fracture or focal lesion. Other: Small left frontal scalp contusion. CT MAXILLOFACIAL FINDINGS Osseous: No fracture or mandibular dislocation. No destructive process. Orbits: Negative. No traumatic or inflammatory finding. Sinuses: Clear.  Clear mastoid air cells and middle ear cavities. Soft tissues: No soft tissue mass or hematoma. No notable contusion. CT CERVICAL SPINE FINDINGS Alignment: Reversal the normal cervical lordosis, apex at C6. Mild degenerative anterolisthesis of C5 on C6 and minimally of C3 and C4 and C4 on C5. No traumatic subluxation. Skull base and vertebrae: No acute fracture. No primary bone lesion or focal pathologic process. Soft tissues and spinal canal: No prevertebral fluid or swelling. No visible canal hematoma. Disc levels: Moderate loss of disc height, at all levels, least prominently involving C4-C5, most severe at C5-C6. Endplate spurring and mild spondylotic disc bulging at all levels except C4-C5. Bilateral facet degenerative changes are noted. No convincing disc herniation. Upper chest: No acute finding.  Clear lung  apices. Other: None. IMPRESSION: HEAD CT 1. No acute intracranial abnormalities.  No skull fracture. 2. Atrophy and mild chronic microvascular ischemic change. MAXILLOFACIAL CT 1. No fracture or acute finding. CERVICAL CT 1. No fracture or acute finding. Electronically Signed   By: Amie Portland M.D.   On: 12/22/2018 16:07    Assessment/Plan 1. Left leg cellulitis Afebrile.skin redness and tenderness  has improved compared to previous visit.will benefit from additional around of antibiotics.I  don't think I&D is indicated due to whole leg trace -1+ edema. - doxycycline (VIBRA-TABS) 100 MG tablet; Take 1 tablet (100 mg total) by mouth 2 (two) times daily for 7 days.  Dispense: 20 tablet; Refill: 0 - saccharomyces boulardii (FLORASTOR) 250 MG capsule; Take 1 capsule (250 mg total) by mouth 2 (two) times daily for 10 days.  Dispense: 20 capsule; Refill: 0  2. Edema, unspecified type Trace -1+ edema apply knee high ted hose on in the morning and off at bedtime.  - Compression stockings  Family/ staff Communication: Reviewed plan of care with patient and daughter.  Labs/tests ordered: None   Aja Whitehair C Lahela Woodin, NP

## 2019-01-15 NOTE — Patient Instructions (Signed)
1. Take Doxycycline 100 mg tablet one by mouth twice daily x 7 days   2. Florastor 250 mg capsule or over the counter Probiotics one by mouth twice daily x 10 days   3. Home health Nurse to Continue with current dressing changes to right lower leg  4. Notify provider if left leg abscess not resolve or worsen.

## 2019-01-16 DIAGNOSIS — S51812D Laceration without foreign body of left forearm, subsequent encounter: Secondary | ICD-10-CM | POA: Diagnosis not present

## 2019-01-16 DIAGNOSIS — S81812D Laceration without foreign body, left lower leg, subsequent encounter: Secondary | ICD-10-CM | POA: Diagnosis not present

## 2019-01-16 DIAGNOSIS — M150 Primary generalized (osteo)arthritis: Secondary | ICD-10-CM | POA: Diagnosis not present

## 2019-01-16 DIAGNOSIS — E890 Postprocedural hypothyroidism: Secondary | ICD-10-CM | POA: Diagnosis not present

## 2019-01-16 DIAGNOSIS — S62647D Nondisplaced fracture of proximal phalanx of left little finger, subsequent encounter for fracture with routine healing: Secondary | ICD-10-CM | POA: Diagnosis not present

## 2019-01-16 DIAGNOSIS — M48062 Spinal stenosis, lumbar region with neurogenic claudication: Secondary | ICD-10-CM | POA: Diagnosis not present

## 2019-01-20 DIAGNOSIS — S51812D Laceration without foreign body of left forearm, subsequent encounter: Secondary | ICD-10-CM | POA: Diagnosis not present

## 2019-01-20 DIAGNOSIS — E890 Postprocedural hypothyroidism: Secondary | ICD-10-CM | POA: Diagnosis not present

## 2019-01-20 DIAGNOSIS — S81812D Laceration without foreign body, left lower leg, subsequent encounter: Secondary | ICD-10-CM | POA: Diagnosis not present

## 2019-01-20 DIAGNOSIS — S62647D Nondisplaced fracture of proximal phalanx of left little finger, subsequent encounter for fracture with routine healing: Secondary | ICD-10-CM | POA: Diagnosis not present

## 2019-01-20 DIAGNOSIS — M150 Primary generalized (osteo)arthritis: Secondary | ICD-10-CM | POA: Diagnosis not present

## 2019-01-20 DIAGNOSIS — M48062 Spinal stenosis, lumbar region with neurogenic claudication: Secondary | ICD-10-CM | POA: Diagnosis not present

## 2019-01-21 DIAGNOSIS — S51812D Laceration without foreign body of left forearm, subsequent encounter: Secondary | ICD-10-CM | POA: Diagnosis not present

## 2019-01-21 DIAGNOSIS — S62647D Nondisplaced fracture of proximal phalanx of left little finger, subsequent encounter for fracture with routine healing: Secondary | ICD-10-CM | POA: Diagnosis not present

## 2019-01-21 DIAGNOSIS — M48062 Spinal stenosis, lumbar region with neurogenic claudication: Secondary | ICD-10-CM | POA: Diagnosis not present

## 2019-01-21 DIAGNOSIS — E890 Postprocedural hypothyroidism: Secondary | ICD-10-CM | POA: Diagnosis not present

## 2019-01-21 DIAGNOSIS — S81812D Laceration without foreign body, left lower leg, subsequent encounter: Secondary | ICD-10-CM | POA: Diagnosis not present

## 2019-01-21 DIAGNOSIS — M150 Primary generalized (osteo)arthritis: Secondary | ICD-10-CM | POA: Diagnosis not present

## 2019-01-23 DIAGNOSIS — M150 Primary generalized (osteo)arthritis: Secondary | ICD-10-CM | POA: Diagnosis not present

## 2019-01-23 DIAGNOSIS — S62647D Nondisplaced fracture of proximal phalanx of left little finger, subsequent encounter for fracture with routine healing: Secondary | ICD-10-CM | POA: Diagnosis not present

## 2019-01-23 DIAGNOSIS — E890 Postprocedural hypothyroidism: Secondary | ICD-10-CM | POA: Diagnosis not present

## 2019-01-23 DIAGNOSIS — S81812D Laceration without foreign body, left lower leg, subsequent encounter: Secondary | ICD-10-CM | POA: Diagnosis not present

## 2019-01-23 DIAGNOSIS — S51812D Laceration without foreign body of left forearm, subsequent encounter: Secondary | ICD-10-CM | POA: Diagnosis not present

## 2019-01-23 DIAGNOSIS — M48062 Spinal stenosis, lumbar region with neurogenic claudication: Secondary | ICD-10-CM | POA: Diagnosis not present

## 2019-02-11 ENCOUNTER — Other Ambulatory Visit (HOSPITAL_COMMUNITY): Payer: Self-pay | Admitting: Nurse Practitioner

## 2019-02-11 ENCOUNTER — Other Ambulatory Visit: Payer: Self-pay | Admitting: Nurse Practitioner

## 2019-02-25 ENCOUNTER — Telehealth (HOSPITAL_COMMUNITY): Payer: Self-pay | Admitting: *Deleted

## 2019-02-25 NOTE — Telephone Encounter (Signed)
Patient called in stating for the last several days she has been more short of breath. Noticed edema in lower extremity. Has not taken any lasix recently. Per Roderic Palau NP take 20mg  of lasix for 3 days then call with report. Pt in agreement.

## 2019-02-28 NOTE — Telephone Encounter (Signed)
Patient called back stating she is feeling much better with 3 days of lasix. Pt will call back if further issues.

## 2019-03-10 ENCOUNTER — Other Ambulatory Visit: Payer: Self-pay | Admitting: Nurse Practitioner

## 2019-03-15 DIAGNOSIS — Z23 Encounter for immunization: Secondary | ICD-10-CM | POA: Diagnosis not present

## 2019-03-25 ENCOUNTER — Encounter (HOSPITAL_COMMUNITY): Payer: Self-pay | Admitting: Nurse Practitioner

## 2019-03-25 ENCOUNTER — Ambulatory Visit (HOSPITAL_COMMUNITY)
Admission: RE | Admit: 2019-03-25 | Discharge: 2019-03-25 | Disposition: A | Discharge status: Home / Self Care | Payer: Medicare Other | Source: Ambulatory Visit | Attending: Nurse Practitioner | Admitting: Nurse Practitioner

## 2019-03-25 ENCOUNTER — Other Ambulatory Visit: Payer: Self-pay

## 2019-03-25 VITALS — BP 110/66 | HR 88 | Ht 63.0 in | Wt 150.8 lb

## 2019-03-25 DIAGNOSIS — Z79899 Other long term (current) drug therapy: Secondary | ICD-10-CM | POA: Insufficient documentation

## 2019-03-25 DIAGNOSIS — E079 Disorder of thyroid, unspecified: Secondary | ICD-10-CM | POA: Insufficient documentation

## 2019-03-25 DIAGNOSIS — Z7901 Long term (current) use of anticoagulants: Secondary | ICD-10-CM | POA: Diagnosis not present

## 2019-03-25 DIAGNOSIS — Z87891 Personal history of nicotine dependence: Secondary | ICD-10-CM | POA: Diagnosis not present

## 2019-03-25 DIAGNOSIS — E039 Hypothyroidism, unspecified: Secondary | ICD-10-CM | POA: Diagnosis not present

## 2019-03-25 DIAGNOSIS — I4821 Permanent atrial fibrillation: Secondary | ICD-10-CM

## 2019-03-25 NOTE — Progress Notes (Signed)
Patient ID: Katrina Velez, female   DOB: Jun 24, 1925, 85 y.o.   MRN: [id number]       Primary Care Physician: Sharon Seller, NP Referring Physician: Dr. Laqueta Jean Brundidge is a 85 y.o. female with a h/o afib that was loaded on amiodarone and successfully cardioverted 4/13, but with ERAF. She did not see that much difference in SR and had some thyroid concerns  already on replacement, so decision was made to stop amiodarone and aim for rate control,HR has been staying below 100 at home. Her energy is good for her age and she is back to hanging clothes outside. No issues with anticoagulant.   Returns to afib clinic 8/23, after she has noticed at home some fluctuation in heart rate. Her daughter on taking her heart rate and Bp one day, noticed that hr heart rate was 48 bpm, although the pt did not feel bad. She talked to the on call provider and was advised to skip the next rate control drug. A few days later, the pt had a nonsustained episode of rvr at 150 bpm. She did feel lightheaded with that episode. On call provider was contacted and she was told to how to take extra rate control if needed. However after a few more minutes, her HR slowed down.  EKG showed afib with controlled afib. She is also upset because she feels that she needs more thyroid replacement. She has also felt better on a higher dose than what the labs suggest that she takes. Her synthroid was reduced several months back and the pt is c/o thinning hair, thinning nails, more fatigue, depression. She is planning to change to a new MD in the first of October to see if she can get straightened out with her thyroid.   Returns 9/6, holter monitor placed on previous visit and reviewed. Shows average HR around 107 bpm, high rate 160 bpm, minimum 40 bpm at 4:30 am. Pt was not aware of fast or slow heart rate.She could benefit from more rate control. She is still able to do her house work with minimal symptoms and feels that she has good  rate control.   F/u in the afib clinic 10/25 and has good rate control. Currently is on metoprolol ER 25 mg bid and cardizem 120 mg bid. Tried higher doses of BB but could not tolerate. She is tolerating this combination of rate control with HR in the 80's.   F/u in afib clinic,4/25, she is feeling well. Does not feel afib . No issues with shortness of breath or pedal edema. No bleeding issues with eliquis.  F/u in fib clinic, 09/20/17. She has now turned 89 and is doing well. She did lacerate her leg on a patio chair about 6 weeks  Ago and did need stitches. It is healing but a large dark scab is still present. Otherwise, no complaints.  F/u in afib clinic, 03/28/18. She is doing very well. No complaints. It took the area on her leg described above, 6 months to heal and eventually this was done with the help of the would clinic. She is now 89 and going strong. Her renal panel for June showed a creatinine of 0.84 and with her current weight, she still qualifies for 5 mg eliquis bid. No bleeding issues.  F/u in afib clinic, 03/25/19. She continues in rate controlled afib.She did have a fall at home  and went to the ER with bruising and a couple of stitches.No major trauma. She  is now walking with a cane in the house and has a fall alert bracelet now. Does not know why she fell, just  got off balance.    Today, she denies symptoms of palpitations, chest pain, shortness of breath, orthopnea, PND, lower extremity edema, dizziness, presyncope, syncope, or neurologic sequela. The patient is tolerating medications without difficulties and is otherwise without complaint today.   Past Medical History:  Diagnosis Date   A-fib (HCC) 05/2015   HOSPITALIZED    Arthritis    Cataract    Hypothyroidism    Permanent atrial fibrillation (HCC)    Thyroid disease    Past Surgical History:  Procedure Laterality Date   CARDIOVERSION N/A 06/08/2015   Procedure: CARDIOVERSION;  Surgeon: Jake Bathe, MD;   Location: [facility] ENDOSCOPY;  Service: Cardiovascular;  Laterality: N/A;   CARDIOVERSION N/A 09/02/2015   Procedure: CARDIOVERSION;  Surgeon: Laqueta Linden, MD;  Location: [facility] ENDOSCOPY;  Service: Cardiovascular;  Laterality: N/A;   CATARACT EXTRACTION, BILATERAL Bilateral 2010   JOINT REPLACEMENT     KNEE SURGERY  2002   right hip replacement     RIGHT KNEE REPLACEMENT  2011   TEE WITHOUT CARDIOVERSION N/A 06/08/2015   Procedure: TRANSESOPHAGEAL ECHOCARDIOGRAM (TEE);  Surgeon: Jake Bathe, MD;  Location: [facility] ENDOSCOPY;  Service: Cardiovascular;  Laterality: N/A;   TOTAL HIP ARTHROPLASTY Right 2011   TOTAL SHOULDER REPLACEMENT Right 2003   TOTAL SHOULDER REPLACEMENT Left 2007   WRIST SURGERY Bilateral 2008   both wrists    Current Outpatient Medications  Medication Sig Dispense Refill   ascorbic acid (VITAMIN C) 500 MG tablet Take 1,000 mg by mouth daily.     CARDIZEM LA 120 MG 24 hr tablet TAKE 1 TABLET (120 MG TOTAL) BY MOUTH 2 (TWO) TIMES DAILY. 180 tablet 3   cholecalciferol (VITAMIN D) 1000 units tablet Take 1,000 Units by mouth daily.     ELIQUIS 5 MG TABS tablet TAKE 1 TABLET BY MOUTH TWICE A DAY 180 tablet 1   furosemide (LASIX) 20 MG tablet TAKE 1 TABLET DAILY AS NEEDED FOR WEIGHT GAIN >3LBS. 30 tablet 2   metoprolol succinate (TOPROL-XL) 25 MG 24 hr tablet TAKE 1 TABLET BY MOUTH TWICE A DAY 180 tablet 2   Multiple Vitamin (MULTIVITAMIN) capsule Take 1 capsule by mouth daily.     SYNTHROID 125 MCG tablet TAKE 1 TABLET (125 MCG TOTAL) BY MOUTH DAILY BEFORE BREAKFAST. PLEASE CALL AND SCHEDULE AN APPOINTMENT WITH [facility] FOR MORE REFILLS 90 tablet 0   vitamin E 400 UNIT capsule Take 400 Units by mouth daily.     No current facility-administered medications for this encounter.     Allergies  Allergen Reactions   Banana Nausea Only    All banana products also   Penicillins Hives    Social History   Socioeconomic History   Marital status: Widowed     Spouse name: Not on file   Number of children: Not on file   Years of education: Not on file   Highest education level: Not on file  Occupational History   Not on file  Social Needs   Financial resource strain: Not hard at all   Food insecurity    Worry: Never true    Inability: Never true   Transportation needs    Medical: No    Non-medical: No  Tobacco Use   Smoking status: Former Smoker    Packs/day: 1.00    Years: 65.00    Pack years:  65.00    Types: Cigarettes    Quit date: 05/28/2011    Years since quitting: 7.8   Smokeless tobacco: Never Used  Substance and Sexual Activity   Alcohol use: Yes    Alcohol/week: 1.0 - 2.0 standard drinks    Types: 1 - 2 Glasses of wine per week    Comment: occasional drink   Drug use: No   Sexual activity: Not on file  Lifestyle   Physical activity    Days per week: 0 days    Minutes per session: 0 min   Stress: Only a little  Relationships   Social connections    Talks on phone: More than three times a week    Gets together: More than three times a week    Attends religious service: Never    Active member of club or organization: No    Attends meetings of clubs or organizations: Never    Relationship status: Widowed   Intimate partner violence    Fear of current or ex partner: No    Emotionally abused: No    Physically abused: No    Forced sexual activity: No  Other Topics Concern   Not on file  Social History Narrative   DIET: NONE      DO YOU DRINK/EAT THINGS WITH CAFFEINE: YES, COFFEE AND TEA       MARITAL STATUS: WIDOWED      WHAT YEAR WERE YOU MARRIED:1950      DO YOU LIVE IN A HOUSE, APARTMENT, ASSISTED LIVING, CONDO TRAILER ETC.: HOUSE       IS IT ONE OR MORE STORIES: 2 STORIES      HOW MANY PERSONS LIVE IN YOUR HOME: 1      DO YOU HAVE PETS IN YOUR HOME: 1 CAT AND A PART TIME DOG       CURRENT OR PAST PROFESSION: CIVIL SERVANT      DO YOU EXERCISE: NO       WHAT TYPE AND HOW OFTEN:NO     Family History  Problem Relation Age of Onset   Hypercalcemia Daughter    Hypertension Daughter     ROS- All systems are reviewed and negative except as per the HPI above  Physical Exam: Vitals:   03/25/19 1111  BP: 110/66  Pulse: 88  Weight: 68.4 kg  Height: 5\' 3"  (1.6 m)    GEN- The patient is well appearing, alert and oriented x 3 today.   Head- normocephalic, atraumatic Eyes-  Sclera clear, conjunctiva pink Ears- hearing intact Oropharynx- clear Neck- supple, no JVP Lymph- no cervical lymphadenopathy Lungs- Clear to ausculation bilaterally, normal work of breathing Heart- Irregular rate and rhythm, no murmurs, rubs or gallops, PMI not laterally displaced GI- soft, NT, ND, + BS Extremities- no clubbing, cyanosis, or edema MS- no significant deformity or atrophy Skin- no rash or lesion Psych- euthymic mood, full affect Neuro- strength and sensation are intact  EKG-afib at 88 bpm, qrs int 80 ms, qtc 4447 ms Epic records reviewed   Assessment and Plan: 1.Long standing permanent afib Rate controlled Appears to be doing well Continue metoprolol ER 25 mg bid and cardizem 120 mg bid, appears to have HR well controlled with these doses Continue eliquis 5 mg bid, Creatinine last checked 05/2018, 0.78, weight  over 60 kg, no bleeding issues Scheduled for physical labs again in January 2020  2. Hypothyroidism  Per pcp  F/u 6 months  Geroge Baseman. Rhys Lichty, [facility]  [address] [place], [place] [address] [phone]

## 2019-04-24 ENCOUNTER — Other Ambulatory Visit (HOSPITAL_COMMUNITY): Payer: Self-pay | Admitting: Nurse Practitioner

## 2019-04-24 DIAGNOSIS — I4821 Permanent atrial fibrillation: Secondary | ICD-10-CM

## 2019-05-26 ENCOUNTER — Encounter: Payer: Self-pay | Admitting: Family

## 2019-05-26 ENCOUNTER — Other Ambulatory Visit: Payer: Self-pay

## 2019-05-26 ENCOUNTER — Ambulatory Visit (INDEPENDENT_AMBULATORY_CARE_PROVIDER_SITE_OTHER): Payer: Medicare Other | Admitting: Family

## 2019-05-26 DIAGNOSIS — R609 Edema, unspecified: Secondary | ICD-10-CM

## 2019-05-26 NOTE — Progress Notes (Signed)
This service is provided via telemedicine  No vital signs collected/recorded due to the encounter was a telemedicine visit.   Location of patient (ex: home, work):  Home   Patient consents to a telephone visit: yes  Location of the provider (ex: office, home):  Office   Name of any referring provider:  Abbey Chatters, NP   Names of all persons participating in the telemedicine service and their role in the encounter:  Richarda Blade, NP, Asher Muir CMA, and patient   Time spent on call:  Asher Muir CMA spent 9 minutes on phone with patient    Provider: Dinah Ngetich FNP-C  Sharon Seller, NP  Patient Care Team: Sharon Seller, NP as PCP - General (Geriatric Medicine) Sallye Lat, MD as Consulting Physician (Ophthalmology) Newman Nip, NP as Nurse Practitioner (Cardiology)  Extended Emergency Contact Information Primary Emergency Contact: [facility] Address: [address]          [place], [place] [address] [place] Home Phone: [phone] Mobile Phone: [phone] Relation: Daughter Secondary Emergency Contact: Vanice Sarah [place] of [place] Home Phone: [phone] Mobile Phone: [phone] Relation: Son  Code Status:  Full code  Goals of care: Advanced Directive information Advanced Directives 10/04/2018  Does Patient Have a Medical Advance Directive? Yes  Type of Advance Directive Living will  Does patient want to make changes to medical advance directive? No - Patient declined  Copy of Healthcare Power of Attorney in Chart? -  Would patient like information on creating a medical advance directive? -  Pre-existing out of facility DNR order (yellow form or pink MOST form) -     Chief Complaint  Patient presents with  . Acute Visit    Left ankle and swelling. States no swelling today, states left ankle was injured since august   . Medication Management    States no swelling today taking lasix     HPI:   Pt is a 86 y.o. female seen today for an acute visit for evaluation of left leg swelling x 1 day.she states last night while she was watching TV she noticed her slipper was wet she wiped it off but kept getting wet.she realized that the fluid was running down to her slipper from her left ankle.she states injured foot several month ago had pain but this has improved.she states her daughter Hinton Rao on the computer and told her this was edema.she took her PRN Furosemide 20 mg tablet and this helped with her swelling.she states swelling resolved today.she states usually her legs are fine whenever she wakes up in the morning but as the day progresses the legs swell.she states has knee high compression stockings which she wore them for a few weeks but quit wearing them because of difficulties removing stockings. She denies any fatigue,cough,shortness of breath or wheezing.she thinks she might have gained 3 lbs though has not weighed her self today.she states just checks weight whenever she goes to the doctor.   Past Medical History:  Diagnosis Date  . A-fib (HCC) 05/2015   HOSPITALIZED   . Arthritis   . Cataract   . Hypothyroidism   . Permanent atrial fibrillation (HCC)   . Thyroid disease    Past Surgical History:  Procedure Laterality Date  . CARDIOVERSION N/A 06/08/2015   Procedure: CARDIOVERSION;  Surgeon: Jake Bathe, MD;  Location: [facility] ENDOSCOPY;  Service: Cardiovascular;  Laterality: N/A;  . CARDIOVERSION N/A 09/02/2015   Procedure: CARDIOVERSION;  Surgeon: Laqueta Linden, MD;  Location: [facility] ENDOSCOPY;  Service: Cardiovascular;  Laterality: N/A;  . CATARACT EXTRACTION, BILATERAL Bilateral 2010  . JOINT REPLACEMENT    . KNEE SURGERY  2002  . right hip replacement    . RIGHT KNEE REPLACEMENT  2011  . TEE WITHOUT CARDIOVERSION N/A 06/08/2015   Procedure: TRANSESOPHAGEAL ECHOCARDIOGRAM (TEE);  Surgeon: Jake Bathe, MD;  Location: [facility] ENDOSCOPY;  Service: Cardiovascular;  Laterality: N/A;   . TOTAL HIP ARTHROPLASTY Right 2011  . TOTAL SHOULDER REPLACEMENT Right 2003  . TOTAL SHOULDER REPLACEMENT Left 2007  . WRIST SURGERY Bilateral 2008   both wrists    Allergies  Allergen Reactions  . Banana Nausea Only    All banana products also  . Penicillins Hives    Outpatient Encounter Medications as of 05/26/2019  Medication Sig  . ascorbic acid (VITAMIN C) 500 MG tablet Take 1,000 mg by mouth daily.  Marland Kitchen CARDIZEM LA 120 MG 24 hr tablet TAKE 1 TABLET (120 MG TOTAL) BY MOUTH 2 (TWO) TIMES DAILY.  . cholecalciferol (VITAMIN D) 1000 units tablet Take 1,000 Units by mouth daily.  Marland Kitchen ELIQUIS 5 MG TABS tablet TAKE 1 TABLET BY MOUTH TWICE A DAY  . furosemide (LASIX) 20 MG tablet TAKE 1 TABLET DAILY AS NEEDED FOR WEIGHT GAIN >3LBS.  Marland Kitchen metoprolol succinate (TOPROL-XL) 25 MG 24 hr tablet TAKE 1 TABLET BY MOUTH TWICE A DAY  . Multiple Vitamin (MULTIVITAMIN) capsule Take 1 capsule by mouth daily.  Marland Kitchen SYNTHROID 125 MCG tablet TAKE 1 TABLET (125 MCG TOTAL) BY MOUTH DAILY BEFORE BREAKFAST. PLEASE CALL AND SCHEDULE AN APPOINTMENT WITH [facility] FOR MORE REFILLS  . vitamin E 400 UNIT capsule Take 400 Units by mouth daily.   No facility-administered encounter medications on file as of 05/26/2019.    Review of Systems  Constitutional: Negative for appetite change, chills, fatigue and fever.  HENT: Negative for congestion and sore throat.        Chronic runny nose and sneezing.takes Loratadine 10 mg tablet daily   Eyes: Negative for pain, discharge, redness and itching.  Respiratory: Negative for cough, chest tightness, shortness of breath and wheezing.   Cardiovascular: Positive for leg swelling. Negative for chest pain and palpitations.  Gastrointestinal: Negative for abdominal distention, abdominal pain, constipation, diarrhea, nausea and vomiting.  Genitourinary: Negative for decreased urine volume, difficulty urinating, dysuria, flank pain, frequency and urgency.  Skin: Negative for color change,  pallor, rash and wound.  Neurological: Negative for dizziness, weakness, light-headedness, numbness and headaches.  Hematological: Does not bruise/bleed easily.  Psychiatric/Behavioral: Negative for agitation, confusion and sleep disturbance. The patient is not nervous/anxious.     Immunization History  Administered Date(s) Administered  . Fluad Quad(high Dose 65+) 03/15/2019  . Influenza Split 03/25/2012  . Influenza, High Dose Seasonal PF 03/28/2018  . Influenza,inj,Quad PF,6+ Mos 02/24/2013, 03/15/2015, 02/25/2016  . Influenza-Unspecified 05/01/2014, 03/19/2017  . Pneumococcal Conjugate-13 05/27/2014  . Pneumococcal Polysaccharide-23 02/25/2016  . Tdap 10/10/2016  . Zoster 05/22/2001  . Zoster Recombinat (Shingrix) 08/31/2017, 11/07/2017   Pertinent  Health Maintenance Due  Topic Date Due  . INFLUENZA VACCINE  Completed  . DEXA SCAN  Completed  . PNA vac Low Risk Adult  Completed   Fall Risk  05/26/2019 01/15/2019 01/03/2019 12/23/2018 10/04/2018  Falls in the past year? 1 1 1 1  0  Number falls in past yr: 0 0 1 1 0  Injury with Fall? 1 1 1 1  0  Comment - Stiches and brused boy skni tears on arm and  leg and broke pinky - - -    There were no vitals filed for this visit. There is no height or weight on file to calculate BMI. Physical Exam  Unable to complete on telephone visit.   Labs reviewed: Recent Labs    06/19/18 1106  NA 140  K 5.1  CL 103  CO2 29  GLUCOSE 108  BUN 20  CREATININE 0.78  CALCIUM 10.5*   Recent Labs    06/19/18 1106  AST 14  ALT 7  BILITOT 0.6  PROT 6.8   Recent Labs    06/19/18 1106 01/03/19 1127  WBC 6.1 8.6  NEUTROABS 4,032 6,502  HGB 13.9 12.7  HCT 42.5 38.5  MCV 89.7 89.7  PLT 323 467*   Lab Results  Component Value Date   TSH 0.40 06/19/2018   No results found for: HGBA1C Lab Results  Component Value Date   CHOL 193 05/27/2014   HDL 66 05/27/2014   LDLCALC 106 (H) 05/27/2014   TRIG 107 05/27/2014   CHOLHDL 2.9  05/27/2014    Significant Diagnostic Results in last 30 days:  No results found.  Assessment/Plan  Edema, unspecified type - report edema with weeping yesterday which resolved after taking her Furosemide. No cough,shortness of breath or wheezing reported.Has not checked weight but reported possible 3 lbs weight gain. - Encouraged to take Furosemide 20 mg tablet daily as needed for abrupt weight gain,edema ,shortness of breath or wheezing. - Avoid adding salt extra to food to prevent fluid retention which worsen swelling on the legs. - Elevate legs daily while seated - Notify provider's office or go to ED if swelling not resolve after taking  furosemide 20 mg tablet or you develop any shortness of breath,cough or wheezing.  Family/ staff Communication: Reviewed plan of care with patient.  Labs/tests ordered: None   Next appointment: 6 Months for medical management of chronic issues with Sherrie Mustache NP   Spent 12 minutes of non-face to face with patient   Sandrea Hughs, NP

## 2019-05-26 NOTE — Patient Instructions (Signed)
-   Take Furosemide 20 mg tablet daily as needed for abrupt weight gain,edema ,shortness of breath or wheezing.  - Avoid adding extra salt to food to prevent fluid retention which worsen swelling on the legs.  - Elevate legs daily while seated  - Notify provider's office or go to ED if swelling not resolve after taking  furosemide 20 mg tablet or you develop any shortness of breath,cough or wheezing.

## 2019-06-03 ENCOUNTER — Ambulatory Visit: Payer: Medicare Other | Attending: Internal Medicine

## 2019-06-03 DIAGNOSIS — Z23 Encounter for immunization: Secondary | ICD-10-CM | POA: Insufficient documentation

## 2019-06-03 NOTE — Progress Notes (Signed)
   Covid-19 Vaccination Clinic  Name:  Ashunti Schofield    MRN: [id number] DOB: [birth year]  06/03/2019  Ms. Laughery was observed post Covid-19 immunization for 30 minutes based on pre-vaccination screening without incidence. She was provided with Vaccine Information Sheet and instruction to access the V-Safe system.   Ms. Greiner was instructed to call 911 with any severe reactions post vaccine: Marland Kitchen Difficulty breathing  . Swelling of your face and throat  . A fast heartbeat  . A bad rash all over your body  . Dizziness and weakness    Immunizations Administered    Name Date Dose VIS Date Route   Pfizer COVID-19 Vaccine 06/03/2019 12:40 PM 0.3 mL 05/02/2019 Intramuscular   Manufacturer: [employer], [employer]   Lot: [id number]   NDC: [address]-1

## 2019-06-22 ENCOUNTER — Ambulatory Visit: Payer: Medicare Other | Attending: Internal Medicine

## 2019-06-22 DIAGNOSIS — Z23 Encounter for immunization: Secondary | ICD-10-CM | POA: Insufficient documentation

## 2019-06-22 NOTE — Progress Notes (Signed)
   Covid-19 Vaccination Clinic  Name:  Katrina Velez    MRN: [id number] DOB: 12/30/1925  06/22/2019  Katrina Velez was observed post Covid-19 immunization for 15 minutes without incidence. She was provided with Vaccine Information Sheet and instruction to access the V-Safe system.   Katrina Velez was instructed to call 911 with any severe reactions post vaccine: Marland Kitchen Difficulty breathing  . Swelling of your face and throat  . A fast heartbeat  . A bad rash all over your body  . Dizziness and weakness    Immunizations Administered    Name Date Dose VIS Date Route   Pfizer COVID-19 Vaccine 06/22/2019 10:25 AM 0.3 mL 05/02/2019 Intramuscular   Manufacturer: [employer], [employer]   Lot: [id number]   NDC: [address]-1

## 2019-07-07 ENCOUNTER — Other Ambulatory Visit: Payer: Self-pay | Admitting: Family

## 2019-07-07 ENCOUNTER — Telehealth: Payer: Self-pay | Admitting: *Deleted

## 2019-07-07 ENCOUNTER — Other Ambulatory Visit (HOSPITAL_COMMUNITY): Payer: Self-pay | Admitting: Nurse Practitioner

## 2019-07-07 ENCOUNTER — Other Ambulatory Visit: Payer: Self-pay | Admitting: Nurse Practitioner

## 2019-07-07 DIAGNOSIS — M1991 Primary osteoarthritis, unspecified site: Secondary | ICD-10-CM

## 2019-07-07 DIAGNOSIS — M48062 Spinal stenosis, lumbar region with neurogenic claudication: Secondary | ICD-10-CM

## 2019-07-07 NOTE — Telephone Encounter (Signed)
Yes this is not on her medication list and that is likely why it was denied. Has she tried using Tylenol 500 mg 1-2 tablets every 8 hours as needed for pain. If this is not effective would recommend setting her up for an appt to discuss options.  Thank you.,

## 2019-07-07 NOTE — Telephone Encounter (Signed)
Patient called and stated that she needs a refill on her Tramadol. Patient stated that she takes this medication for her Arthritis pain. Stated that she has been on it for awhile but does not take it everyday just when needed. Stated that her last refill was 12/22/18 for #60 and she is just needing a refill. Stated that her refill was denied. [facility] Database Verified and LR: 12/23/18 and we have been refilling since 2019. Patient is requesting a refill please Advise.

## 2019-07-08 NOTE — Telephone Encounter (Signed)
Patient stated that she cannot take Tylenol. Scheduled an appointment to be seen tomorrow with Shanda Bumps to get Rx for Tramadol

## 2019-07-09 ENCOUNTER — Ambulatory Visit (INDEPENDENT_AMBULATORY_CARE_PROVIDER_SITE_OTHER): Payer: Medicare Other | Admitting: Nurse Practitioner

## 2019-07-09 ENCOUNTER — Other Ambulatory Visit: Payer: Self-pay

## 2019-07-09 ENCOUNTER — Encounter: Payer: Self-pay | Admitting: Nurse Practitioner

## 2019-07-09 VITALS — BP 136/78 | HR 59 | Temp 97.1°F | Ht 63.0 in | Wt 149.0 lb

## 2019-07-09 DIAGNOSIS — R609 Edema, unspecified: Secondary | ICD-10-CM

## 2019-07-09 DIAGNOSIS — M48062 Spinal stenosis, lumbar region with neurogenic claudication: Secondary | ICD-10-CM

## 2019-07-09 DIAGNOSIS — M1991 Primary osteoarthritis, unspecified site: Secondary | ICD-10-CM

## 2019-07-09 DIAGNOSIS — E034 Atrophy of thyroid (acquired): Secondary | ICD-10-CM | POA: Diagnosis not present

## 2019-07-09 DIAGNOSIS — I480 Paroxysmal atrial fibrillation: Secondary | ICD-10-CM | POA: Diagnosis not present

## 2019-07-09 MED ORDER — TRAMADOL HCL 50 MG PO TABS: 50.0000 mg | ORAL_TABLET | Freq: Every day | ORAL | 0 refills | Status: DC | PRN

## 2019-07-09 NOTE — Patient Instructions (Addendum)
Can try compression socks for lower leg swelling.   Follow up in 4 weeks for advanced care planning/MOST form via virtual visit/telephone visit  6 month for routine follow up  Make AWV with Shanda Bumps, NP

## 2019-07-09 NOTE — Progress Notes (Signed)
Careteam: Patient Care Team: Sharon Seller, NP as PCP - General (Geriatric Medicine) Sallye Lat, MD as Consulting Physician (Ophthalmology) Newman Nip, NP as Nurse Practitioner (Cardiology)  Advanced Directive information Does Patient Have a Medical Advance Directive?: Yes, Type of Advance Directive: Healthcare Power of Attorney, Does patient want to make changes to medical advance directive?: No - Patient declined  Allergies  Allergen Reactions  . Banana Nausea Only    All banana products also  . Penicillins Hives    Chief Complaint  Patient presents with  . Acute Visit    Discuss getting rx for tramadol due to generalized pain. Patient describes pain as arthritis.      HPI: Patient is a 86 y.o. female for follow up.  Primary OA/spinal stenosis of lumbar spine- previously on tramadol- but was taking off list- she reports low back pain.  MRI lumbar spine in 2018 with severe DDD with left L5-s1 foraminal L5 compression, moderate to severe stenosis L3-L4 She does not take tramadol often. Tylenol makes her nauseous and does not tolerate this. Took ASA for years but now on eliquis and can not take with that.  She does not even need to take one daily but will take one when the pain gets bad. gabapentin was not effective and she did not continue to take.   seasonal allergies- uses eye drops when needed. Using Claritin 10 mg daily  PAF- rate controlled, occasional palpitations-notices more when she is quiet. Continues on metoprolol and cardizem for rate control. Goes to a fib clinic where they monitor her EKG.   Hypothyroid- continues on synthroid- no recent TSH  She had a fall in august that resulted in a wound to LE that ended up getting infected- she required multiple visit in office for this and home health nursing.  No falls since then. Uses cane in the house.   LE edema- rarely needs lasix. Has episode last month, took one dose of lasix and no problem  since  Review of Systems:  Review of Systems  Constitutional: Negative for chills, fever and weight loss.  HENT: Negative for tinnitus.   Respiratory: Negative for cough, sputum production and shortness of breath.   Cardiovascular: Negative for chest pain, palpitations and leg swelling (occasional).  Gastrointestinal: Negative for abdominal pain, constipation, diarrhea and heartburn.  Genitourinary: Negative for dysuria, frequency and urgency.  Musculoskeletal: Positive for back pain and joint pain. Negative for falls and myalgias.  Skin: Negative.   Neurological: Negative for dizziness and headaches.  Psychiatric/Behavioral: Negative for depression and memory loss. The patient does not have insomnia.     Past Medical History:  Diagnosis Date  . A-fib (HCC) 05/2015   HOSPITALIZED   . Arthritis   . Cataract   . Hypothyroidism   . Permanent atrial fibrillation (HCC)   . Thyroid disease    Past Surgical History:  Procedure Laterality Date  . CARDIOVERSION N/A 06/08/2015   Procedure: CARDIOVERSION;  Surgeon: Jake Bathe, MD;  Location: [facility] ENDOSCOPY;  Service: Cardiovascular;  Laterality: N/A;  . CARDIOVERSION N/A 09/02/2015   Procedure: CARDIOVERSION;  Surgeon: Laqueta Linden, MD;  Location: [facility] ENDOSCOPY;  Service: Cardiovascular;  Laterality: N/A;  . CATARACT EXTRACTION, BILATERAL Bilateral 2010  . JOINT REPLACEMENT    . KNEE SURGERY  2002  . right hip replacement    . RIGHT KNEE REPLACEMENT  2011  . TEE WITHOUT CARDIOVERSION N/A 06/08/2015   Procedure: TRANSESOPHAGEAL ECHOCARDIOGRAM (TEE);  Surgeon: Jake Bathe,  MD;  Location: [place];  Service: Cardiovascular;  Laterality: N/A;  . TOTAL HIP ARTHROPLASTY Right 2011  . TOTAL SHOULDER REPLACEMENT Right 2003  . TOTAL SHOULDER REPLACEMENT Left 2007  . WRIST SURGERY Bilateral 2008   both wrists   Social History:   reports that she quit smoking about 8 years ago. Her smoking use included cigarettes. She has a 65.00  pack-year smoking history. She has never used smokeless tobacco. She reports current alcohol use of about 1.0 - 2.0 standard drinks of alcohol per week. She reports that she does not use drugs.  Family History  Problem Relation Age of Onset  . Hypercalcemia Daughter   . Hypertension Daughter     Medications: Patient's Medications  New Prescriptions   No medications on file  Previous Medications   ASCORBIC ACID (VITAMIN C) 500 MG TABLET    Take 1,000 mg by mouth daily.   CARDIZEM LA 120 MG 24 HR TABLET    TAKE 1 TABLET (120 MG TOTAL) BY MOUTH 2 (TWO) TIMES DAILY.   CHOLECALCIFEROL (VITAMIN D) 1000 UNITS TABLET    Take 1,000 Units by mouth daily.   ELIQUIS 5 MG TABS TABLET    TAKE 1 TABLET BY MOUTH TWICE A DAY   FUROSEMIDE (LASIX) 20 MG TABLET    TAKE 1 TABLET DAILY AS NEEDED FOR WEIGHT GAIN >3LBS.   LEVOTHYROXINE (SYNTHROID) 125 MCG TABLET    Take 1 tablet (125 mcg total) by mouth daily before breakfast.   METOPROLOL SUCCINATE (TOPROL-XL) 25 MG 24 HR TABLET    TAKE 1 TABLET BY MOUTH TWICE A DAY   MULTIPLE VITAMIN (MULTIVITAMIN) CAPSULE    Take 1 capsule by mouth daily.   VITAMIN E 400 UNIT CAPSULE    Take 400 Units by mouth daily.  Modified Medications   No medications on file  Discontinued Medications   No medications on file    Physical Exam:  Vitals:   07/09/19 1348  BP: 136/78  Pulse: (!) 59  Temp: (!) 97.1 F (36.2 C)  TempSrc: Temporal  SpO2: 98%  Weight: 149 lb (67.6 kg)  Height: 5\' 3"  (1.6 m)   Body mass index is 26.39 kg/m. Wt Readings from Last 3 Encounters:  07/09/19 149 lb (67.6 kg)  03/25/19 150 lb 12.8 oz (68.4 kg)  01/15/19 147 lb (66.7 kg)    Physical Exam Constitutional:      General: She is not in acute distress.    Appearance: She is well-developed. She is not diaphoretic.  HENT:     Head: Normocephalic and atraumatic.     Mouth/Throat:     Pharynx: No oropharyngeal exudate.  Eyes:     Conjunctiva/sclera: Conjunctivae normal.     Pupils:  Pupils are equal, round, and reactive to light.  Cardiovascular:     Rate and Rhythm: Normal rate and regular rhythm.     Heart sounds: Normal heart sounds.  Pulmonary:     Effort: Pulmonary effort is normal.     Breath sounds: Normal breath sounds.  Abdominal:     General: Bowel sounds are normal.     Palpations: Abdomen is soft.  Musculoskeletal:        General: No tenderness.     Cervical back: Normal range of motion and neck supple.  Skin:    General: Skin is warm and dry.  Neurological:     Mental Status: She is alert and oriented to person, place, and time.     Labs reviewed: Basic  Metabolic Panel: No results for input(s): NA, K, CL, CO2, GLUCOSE, BUN, CREATININE, CALCIUM, MG, PHOS, TSH in the last 8760 hours. Liver Function Tests: No results for input(s): AST, ALT, ALKPHOS, BILITOT, PROT, ALBUMIN in the last 8760 hours. No results for input(s): LIPASE, AMYLASE in the last 8760 hours. No results for input(s): AMMONIA in the last 8760 hours. CBC: Recent Labs    01/03/19 1127  WBC 8.6  NEUTROABS 6,502  HGB 12.7  HCT 38.5  MCV 89.7  PLT 467*   Lipid Panel: No results for input(s): CHOL, HDL, LDLCALC, TRIG, CHOLHDL, LDLDIRECT in the last 8760 hours. TSH: No results for input(s): TSH in the last 8760 hours. A1C: No results found for: HGBA1C   Assessment/Plan 1. Edema, unspecified type - down at this time, using lasix PRN but rarely needs. Encouraged compression hose and elevation as tolerated.  - COMPLETE METABOLIC PANEL WITH GFR  2. Spinal stenosis of lumbar region with neurogenic claudication -ongoing, unable to tolerate tylenol and unable to use NSAIDs due to eliquis. Does not use often but when needed takes tramadol which she has tolerated well. No side effects from medication.  - Pain Mgmt, Profile 1 w/o Conf, U - traMADol (ULTRAM) 50 MG tablet; Take 1 tablet (50 mg total) by mouth daily as needed.  Dispense: 30 tablet; Refill: 0  3. PAF (paroxysmal  atrial fibrillation) (HCC) -rate controlled on cardizem, continues on eliquis for anticoagulation.  - CBC with Differential/Platelet - COMPLETE METABOLIC PANEL WITH GFR  4. Hypothyroidism due to acquired atrophy of thyroid Continues on synthroid 112 mcg, will follow up lab - TSH  5. Primary osteoarthritis, unspecified site Ongoing, using tramadol PRN, will uses 1 tablet every few days when pain gets severe.  - traMADol (ULTRAM) 50 MG tablet; Take 1 tablet (50 mg total) by mouth daily as needed.  Dispense: 30 tablet; Refill: 0  Next appt: 4 weeks for advance care planning 6 months for routine follow up  Demonica Farrey K. Biagio Borg  [facility] [phone]

## 2019-07-10 ENCOUNTER — Other Ambulatory Visit: Payer: Self-pay | Admitting: Nurse Practitioner

## 2019-07-10 LAB — COMPLETE METABOLIC PANEL WITH GFR
AG Ratio: 2 (calc) (ref 1.0–2.5)
ALT: 8 U/L (ref 6–29)
AST: 14 U/L (ref 10–35)
Albumin: 4.3 g/dL (ref 3.6–5.1)
Alkaline phosphatase (APISO): 66 U/L (ref 37–153)
BUN: 19 mg/dL (ref 7–25)
CO2: 28 mmol/L (ref 20–32)
Calcium: 10.3 mg/dL (ref 8.6–10.4)
Chloride: 104 mmol/L (ref 98–110)
Creat: 0.79 mg/dL (ref 0.60–0.88)
GFR, Est African American: 76 mL/min/{1.73_m2} (ref >=60)
GFR, Est Non African American: 65 mL/min/{1.73_m2} (ref >=60)
Globulin: 2.2 g/dL (calc) (ref 1.9–3.7)
Glucose, Bld: 93 mg/dL (ref 65–139)
Potassium: 4.6 mmol/L (ref 3.5–5.3)
Sodium: 141 mmol/L (ref 135–146)
Total Bilirubin: 0.5 mg/dL (ref 0.2–1.2)
Total Protein: 6.5 g/dL (ref 6.1–8.1)

## 2019-07-10 LAB — PAIN MGMT, PROFILE 1 W/O CONF, U
Amphetamines: NEGATIVE ng/mL
Barbiturates: NEGATIVE ng/mL
Benzodiazepines: NEGATIVE ng/mL
Cocaine Metabolite: NEGATIVE ng/mL
Creatinine: 92.7 mg/dL
Marijuana Metabolite: NEGATIVE ng/mL
Methadone Metabolite: NEGATIVE ng/mL
Opiates: NEGATIVE ng/mL
Oxidant: NEGATIVE ug/mL
Oxycodone: NEGATIVE ng/mL
Phencyclidine: NEGATIVE ng/mL
pH: 6.2 (ref 4.5–9.0)

## 2019-07-10 LAB — CBC WITH DIFFERENTIAL/PLATELET
Absolute Monocytes: 831 cells/uL (ref 200–950)
Basophils Absolute: 60 cells/uL (ref 0–200)
Basophils Relative: 0.9 %
Eosinophils Absolute: 241 cells/uL (ref 15–500)
Eosinophils Relative: 3.6 %
HCT: 41.2 % (ref 35.0–45.0)
Hemoglobin: 13.7 g/dL (ref 11.7–15.5)
Lymphs Abs: 1367 cells/uL (ref 850–3900)
MCH: 29.8 pg (ref 27.0–33.0)
MCHC: 33.3 g/dL (ref 32.0–36.0)
MCV: 89.6 fL (ref 80.0–100.0)
MPV: 10.7 fL (ref 7.5–12.5)
Monocytes Relative: 12.4 %
Neutro Abs: 4201 cells/uL (ref 1500–7800)
Neutrophils Relative %: 62.7 %
Platelets: 308 10*3/uL (ref 140–400)
RBC: 4.6 10*6/uL (ref 3.80–5.10)
RDW: 13.2 % (ref 11.0–15.0)
Total Lymphocyte: 20.4 %
WBC: 6.7 10*3/uL (ref 3.8–10.8)

## 2019-07-10 LAB — TSH: TSH: 0.37 mIU/L — ABNORMAL LOW (ref 0.40–4.50)

## 2019-07-10 MED ORDER — LEVOTHYROXINE SODIUM 112 MCG PO TABS: 112.0000 ug | ORAL_TABLET | Freq: Every day | ORAL | 1 refills | Status: DC

## 2019-07-23 ENCOUNTER — Other Ambulatory Visit: Payer: Self-pay

## 2019-07-23 ENCOUNTER — Encounter: Payer: Self-pay | Admitting: Nurse Practitioner

## 2019-07-23 ENCOUNTER — Ambulatory Visit (INDEPENDENT_AMBULATORY_CARE_PROVIDER_SITE_OTHER): Payer: Medicare Other | Admitting: Nurse Practitioner

## 2019-07-23 VITALS — BP 122/70 | HR 93 | Temp 97.3°F | Ht 63.0 in | Wt 150.8 lb

## 2019-07-23 DIAGNOSIS — E034 Atrophy of thyroid (acquired): Secondary | ICD-10-CM

## 2019-07-23 NOTE — Progress Notes (Signed)
Careteam: Patient Care Team: Lauree Chandler, NP as PCP - General (Geriatric Medicine) Warden Fillers, MD as Consulting Physician (Ophthalmology) Sherran Needs, NP as Nurse Practitioner (Cardiology)  Advanced Directive information    Allergies  Allergen Reactions  . Banana Nausea Only    All banana products also  . Penicillins Hives    Chief Complaint  Patient presents with  . Follow-up    Discuss labs and other concerns      HPI: Patient is a 86 y.o. female due to hypothyroidism Pt with hx of a fib, OA, chronic back pain, allergies, hypothyroidism  Reports she was taking synthroid 150 mcg and this was already reduced.  Feels like she is having symptoms of her thyroid and does not wish to have another dose reduction.  Reports she has all the symptoms of hypothyroid even though her lab work shows a low TSH.  Reports her memory/thinking is not clear anymore, losing her hair.  Does not want to go down to less than 125 mcg.  Reports "can not live on synthroid 112 mcg" Reports she has been to 2 endocrinologist and she did not agree with what they had to say. "all they look at is the numbers" Takes Name brand synthroid only   Review of Systems:  Review of Systems  Constitutional: Negative for chills, fever and malaise/fatigue.  Respiratory: Negative for shortness of breath.   Cardiovascular: Negative for chest pain, palpitations and leg swelling.  Musculoskeletal: Positive for joint pain.  Psychiatric/Behavioral: Negative for depression. The patient is not nervous/anxious.     Past Medical History:  Diagnosis Date  . A-fib ([place]) 05/2015   HOSPITALIZED   . Arthritis   . Cataract   . Hypothyroidism   . Permanent atrial fibrillation ([place])   . Thyroid disease    Past Surgical History:  Procedure Laterality Date  . CARDIOVERSION N/A 06/08/2015   Procedure: CARDIOVERSION;  Surgeon: Jerline Pain, MD;  Location: [facility] ENDOSCOPY;  Service: Cardiovascular;   Laterality: N/A;  . CARDIOVERSION N/A 09/02/2015   Procedure: CARDIOVERSION;  Surgeon: Herminio Commons, MD;  Location: [place];  Service: Cardiovascular;  Laterality: N/A;  . CATARACT EXTRACTION, BILATERAL Bilateral 2010  . JOINT REPLACEMENT    . KNEE SURGERY  2002  . right hip replacement    . RIGHT KNEE REPLACEMENT  2011  . TEE WITHOUT CARDIOVERSION N/A 06/08/2015   Procedure: TRANSESOPHAGEAL ECHOCARDIOGRAM (TEE);  Surgeon: Jerline Pain, MD;  Location: [place];  Service: Cardiovascular;  Laterality: N/A;  . TOTAL HIP ARTHROPLASTY Right 2011  . TOTAL SHOULDER REPLACEMENT Right 2003  . TOTAL SHOULDER REPLACEMENT Left 2007  . WRIST SURGERY Bilateral 2008   both wrists   Social History:   reports that she quit smoking about 8 years ago. Her smoking use included cigarettes. She has a 65.00 pack-year smoking history. She has never used smokeless tobacco. She reports current alcohol use of about 1.0 - 2.0 standard drinks of alcohol per week. She reports that she does not use drugs.  Family History  Problem Relation Age of Onset  . Hypercalcemia Daughter   . Hypertension Daughter     Medications: Patient's Medications  New Prescriptions   No medications on file  Previous Medications   ASCORBIC ACID (VITAMIN C) 500 MG TABLET    Take 1,000 mg by mouth daily.   CARDIZEM LA 120 MG 24 HR TABLET    TAKE 1 TABLET (120 MG TOTAL) BY MOUTH 2 (TWO) TIMES DAILY.  CHOLECALCIFEROL (VITAMIN D) 1000 UNITS TABLET    Take 1,000 Units by mouth daily.   ELIQUIS 5 MG TABS TABLET    TAKE 1 TABLET BY MOUTH TWICE A DAY   FUROSEMIDE (LASIX) 20 MG TABLET    TAKE 1 TABLET DAILY AS NEEDED FOR WEIGHT GAIN >3LBS.   LEVOTHYROXINE (SYNTHROID) 112 MCG TABLET    Take 1 tablet (112 mcg total) by mouth daily before breakfast.   METOPROLOL SUCCINATE (TOPROL-XL) 25 MG 24 HR TABLET    TAKE 1 TABLET BY MOUTH TWICE A DAY   MULTIPLE VITAMIN (MULTIVITAMIN) CAPSULE    Take 1 capsule by mouth daily.   TRAMADOL  (ULTRAM) 50 MG TABLET    Take 1 tablet (50 mg total) by mouth daily as needed.   VITAMIN E 400 UNIT CAPSULE    Take 400 Units by mouth daily.  Modified Medications   No medications on file  Discontinued Medications   No medications on file    Physical Exam:  Vitals:   07/23/19 1011  BP: 122/70  Pulse: 93  Temp: (!) 97.3 F (36.3 C)  TempSrc: Temporal  SpO2: 98%  Weight: 150 lb 12.8 oz (68.4 kg)  Height: 5\' 3"  (1.6 m)   Body mass index is 26.71 kg/m. Wt Readings from Last 3 Encounters:  07/23/19 150 lb 12.8 oz (68.4 kg)  07/09/19 149 lb (67.6 kg)  03/25/19 150 lb 12.8 oz (68.4 kg)    Physical Exam HENT:     Head: Normocephalic and atraumatic.  Cardiovascular:     Rate and Rhythm: Normal rate and regular rhythm.  Pulmonary:     Effort: Pulmonary effort is normal.     Breath sounds: Normal breath sounds.  Musculoskeletal:     Right lower leg: No edema.     Left lower leg: No edema.  Skin:    General: Skin is warm and dry.  Neurological:     Mental Status: She is alert and oriented to person, place, and time. Mental status is at baseline.     Labs reviewed: Basic Metabolic Panel: Recent Labs    07/09/19 1438  NA 141  K 4.6  CL 104  CO2 28  GLUCOSE 93  BUN 19  CREATININE 0.79  CALCIUM 10.3  TSH 0.37*   Liver Function Tests: Recent Labs    07/09/19 1438  AST 14  ALT 8  BILITOT 0.5  PROT 6.5   No results for input(s): LIPASE, AMYLASE in the last 8760 hours. No results for input(s): AMMONIA in the last 8760 hours. CBC: Recent Labs    01/03/19 1127 07/09/19 1438  WBC 8.6 6.7  NEUTROABS 6,502 4,201  HGB 12.7 13.7  HCT 38.5 41.2  MCV 89.7 89.6  PLT 467* 308   Lipid Panel: No results for input(s): CHOL, HDL, LDLCALC, TRIG, CHOLHDL, LDLDIRECT in the last 8760 hours. TSH: Recent Labs    07/09/19 1438  TSH 0.37*   A1C: No results found for: HGBA1C   Assessment/Plan 1. Hypothyroidism due to acquired atrophy of thyroid -pt reports she  will not decrease her medication to 112 mcg, she states that she just got refill for 125 mcg, name brand synthroid and this was expensive. She has seen endocrinology in the past but fired them due to not agreeing.  -educated her today on the signs and symptoms of hypo and hyperthyroid and that as she ages this can vary from what is "textbook"  Educated on risk for complications related to low TSH and  she has hx of a fib but reports this is not related. -encouraged her to start synthroid 112 and that we would follow up lab in 8 weeks to recheck or referral to endocrinology for second opinion.  - Ambulatory referral to Endocrinology  Next appt: 08/06/2019 as scheduled.  Janene Harvey. Biagio Borg  [facility] Adult [facility] [phone]

## 2019-08-06 ENCOUNTER — Other Ambulatory Visit: Payer: Self-pay

## 2019-08-06 ENCOUNTER — Encounter: Payer: Medicare Other | Admitting: Nurse Practitioner

## 2019-08-06 ENCOUNTER — Encounter: Payer: Self-pay | Admitting: Nurse Practitioner

## 2019-08-06 NOTE — Progress Notes (Signed)
Err

## 2019-08-11 ENCOUNTER — Other Ambulatory Visit: Payer: Self-pay | Admitting: *Deleted

## 2019-08-11 MED ORDER — SYNTHROID 112 MCG PO TABS: 112.0000 ug | ORAL_TABLET | Freq: Every day | ORAL | 1 refills | Status: DC

## 2019-08-15 ENCOUNTER — Other Ambulatory Visit: Payer: Self-pay | Admitting: Nurse Practitioner

## 2019-08-15 DIAGNOSIS — M48062 Spinal stenosis, lumbar region with neurogenic claudication: Secondary | ICD-10-CM

## 2019-08-15 DIAGNOSIS — M1991 Primary osteoarthritis, unspecified site: Secondary | ICD-10-CM

## 2019-09-21 ENCOUNTER — Other Ambulatory Visit (HOSPITAL_COMMUNITY): Payer: Self-pay | Admitting: Nurse Practitioner

## 2019-09-21 DIAGNOSIS — I4821 Permanent atrial fibrillation: Secondary | ICD-10-CM

## 2019-09-23 ENCOUNTER — Other Ambulatory Visit: Payer: Self-pay

## 2019-09-23 ENCOUNTER — Ambulatory Visit (HOSPITAL_COMMUNITY)
Admission: RE | Admit: 2019-09-23 | Discharge: 2019-09-23 | Disposition: A | Discharge status: Home / Self Care | Payer: Medicare Other | Source: Ambulatory Visit | Attending: Nurse Practitioner | Admitting: Nurse Practitioner

## 2019-09-23 ENCOUNTER — Encounter (HOSPITAL_COMMUNITY): Payer: Self-pay | Admitting: Nurse Practitioner

## 2019-09-23 VITALS — BP 112/74 | HR 88 | Ht 63.0 in | Wt 144.6 lb

## 2019-09-23 DIAGNOSIS — Z87891 Personal history of nicotine dependence: Secondary | ICD-10-CM | POA: Diagnosis not present

## 2019-09-23 DIAGNOSIS — E039 Hypothyroidism, unspecified: Secondary | ICD-10-CM | POA: Diagnosis not present

## 2019-09-23 DIAGNOSIS — I4821 Permanent atrial fibrillation: Secondary | ICD-10-CM | POA: Insufficient documentation

## 2019-09-23 DIAGNOSIS — Z7901 Long term (current) use of anticoagulants: Secondary | ICD-10-CM | POA: Diagnosis not present

## 2019-09-23 DIAGNOSIS — Z79899 Other long term (current) drug therapy: Secondary | ICD-10-CM | POA: Diagnosis not present

## 2019-09-23 DIAGNOSIS — D6869 Other thrombophilia: Secondary | ICD-10-CM

## 2019-09-23 NOTE — Progress Notes (Signed)
Patient ID: Katrina Velez, female   DOB: Dec 19, 1925, 86 y.o.   MRN: [id number]       Primary Care Physician: Sharon Seller, NP Referring Physician: Dr. Laqueta Jean Maeda is a 86 y.o. female with a h/o afib that was loaded on amiodarone and successfully cardioverted 4/13, but with ERAF. She did not see that much difference in SR and had some thyroid concerns  already on replacement, so decision was made to stop amiodarone and aim for rate control,HR has been staying below 100 at home. Her energy is good for her age and she is back to hanging clothes outside. No issues with anticoagulant.   Returns to afib clinic 8/23, after she has noticed at home some fluctuation in heart rate. Her daughter on taking her heart rate and Bp one day, noticed that hr heart rate was 48 bpm, although the pt did not feel bad. She talked to the on call provider and was advised to skip the next rate control drug. A few days later, the pt had a nonsustained episode of rvr at 150 bpm. She did feel lightheaded with that episode. On call provider was contacted and she was told to how to take extra rate control if needed. However after a few more minutes, her HR slowed down.  EKG showed afib with controlled afib. She is also upset because she feels that she needs more thyroid replacement. She has also felt better on a higher dose than what the labs suggest that she takes. Her synthroid was reduced several months back and the pt is c/o thinning hair, thinning nails, more fatigue, depression. She is planning to change to a new MD in the first of October to see if she can get straightened out with her thyroid.   Returns 9/6, holter monitor placed on previous visit and reviewed. Shows average HR around 107 bpm, high rate 160 bpm, minimum 40 bpm at 4:30 am. Pt was not aware of fast or slow heart rate.She could benefit from more rate control. She is still able to do her house work with minimal symptoms and feels that she has good  rate control.   F/u in the afib clinic 10/25 and has good rate control. Currently is on metoprolol ER 25 mg bid and cardizem 120 mg bid. Tried higher doses of BB but could not tolerate. She is tolerating this combination of rate control with HR in the 80's.   F/u in afib clinic,4/25, she is feeling well. Does not feel afib . No issues with shortness of breath or pedal edema. No bleeding issues with eliquis.  F/u in fib clinic, 09/20/17. She has now turned 89 and is doing well. She did lacerate her leg on a patio chair about 6 weeks  Ago and did need stitches. It is healing but a large dark scab is still present. Otherwise, no complaints.  F/u in afib clinic, 03/28/18. She is doing very well. No complaints. It took the area on her leg described above, 6 months to heal and eventually this was done with the help of the would clinic. She is now 89 and going strong. Her renal panel for June showed a creatinine of 0.84 and with her current weight, she still qualifies for 5 mg eliquis bid. No bleeding issues.  F/u in afib clinic, 03/25/19. She continues in rate controlled afib.She did have a fall at home  and went to the ER with bruising and a couple of stitches.No major trauma. She  is now walking with a cane in the house and has a fall alert bracelet now. Does not know why she fell, just  got off balance.    F/u 09/23/19 in afib clinic. She feels well. Relieved to have has her covid shots so she can be more active. She and her daughter went to the beach in April and she really enjoyed the change in scenery. Her afib is rate controlled and she does not feel it. She  had a major fall last fall, mostly bruising, but no further reports of fall. Walks with a cane.   Today, she denies symptoms of palpitations, chest pain, shortness of breath, orthopnea, PND, lower extremity edema, dizziness, presyncope, syncope, or neurologic sequela. The patient is tolerating medications without difficulties and is otherwise without  complaint today.   Past Medical History:  Diagnosis Date  . A-fib (HCC) 05/2015   HOSPITALIZED   . Arthritis   . Cataract   . Hypothyroidism   . Permanent atrial fibrillation (HCC)   . Thyroid disease    Past Surgical History:  Procedure Laterality Date  . CARDIOVERSION N/A 06/08/2015   Procedure: CARDIOVERSION;  Surgeon: Jake Bathe, MD;  Location: [facility] ENDOSCOPY;  Service: Cardiovascular;  Laterality: N/A;  . CARDIOVERSION N/A 09/02/2015   Procedure: CARDIOVERSION;  Surgeon: Laqueta Linden, MD;  Location: [facility] ENDOSCOPY;  Service: Cardiovascular;  Laterality: N/A;  . CATARACT EXTRACTION, BILATERAL Bilateral 2010  . JOINT REPLACEMENT    . KNEE SURGERY  2002  . right hip replacement    . RIGHT KNEE REPLACEMENT  2011  . TEE WITHOUT CARDIOVERSION N/A 06/08/2015   Procedure: TRANSESOPHAGEAL ECHOCARDIOGRAM (TEE);  Surgeon: Jake Bathe, MD;  Location: [facility] ENDOSCOPY;  Service: Cardiovascular;  Laterality: N/A;  . TOTAL HIP ARTHROPLASTY Right 2011  . TOTAL SHOULDER REPLACEMENT Right 2003  . TOTAL SHOULDER REPLACEMENT Left 2007  . WRIST SURGERY Bilateral 2008   both wrists    Current Outpatient Medications  Medication Sig Dispense Refill  . ascorbic acid (VITAMIN C) 500 MG tablet Take 1,000 mg by mouth daily.    Marland Kitchen CARDIZEM LA 120 MG 24 hr tablet TAKE 1 TABLET (120 MG TOTAL) BY MOUTH 2 (TWO) TIMES DAILY. 180 tablet 3  . cholecalciferol (VITAMIN D) 1000 units tablet Take 1,000 Units by mouth daily.    Marland Kitchen ELIQUIS 5 MG TABS tablet TAKE 1 TABLET BY MOUTH TWICE A DAY 180 tablet 1  . furosemide (LASIX) 20 MG tablet TAKE 1 TABLET DAILY AS NEEDED FOR WEIGHT GAIN >3LBS. 30 tablet 2  . metoprolol succinate (TOPROL-XL) 25 MG 24 hr tablet TAKE 1 TABLET BY MOUTH TWICE A DAY 180 tablet 2  . Multiple Vitamin (MULTIVITAMIN) capsule Take 1 capsule by mouth daily.    Marland Kitchen SYNTHROID 112 MCG tablet Take 1 tablet (112 mcg total) by mouth daily before breakfast. 90 tablet 1  . traMADol (ULTRAM) 50 MG tablet  TAKE 1 TABLET (50 MG TOTAL) BY MOUTH DAILY AS NEEDED. 30 tablet 0  . vitamin E 400 UNIT capsule Take 400 Units by mouth daily.     No current facility-administered medications for this encounter.    Allergies  Allergen Reactions  . Banana Nausea Only    All banana products also  . Penicillins Hives    Social History   Socioeconomic History  . Marital status: Widowed    Spouse name: Not on file  . Number of children: Not on file  . Years of education: Not on file  .  Highest education level: Not on file  Occupational History  . Not on file  Tobacco Use  . Smoking status: Former Smoker    Packs/day: 1.00    Years: 65.00    Pack years: 65.00    Types: Cigarettes    Quit date: 05/28/2011    Years since quitting: 8.3  . Smokeless tobacco: Never Used  Substance and Sexual Activity  . Alcohol use: Yes    Alcohol/week: 1.0 - 2.0 standard drinks    Types: 1 - 2 Glasses of wine per week    Comment: occasional drink  . Drug use: No  . Sexual activity: Not on file  Other Topics Concern  . Not on file  Social History Narrative   DIET: NONE      DO YOU DRINK/EAT THINGS WITH CAFFEINE: YES, COFFEE AND TEA       MARITAL STATUS: WIDOWED      WHAT YEAR WERE YOU MARRIED:1950      DO YOU LIVE IN A HOUSE, APARTMENT, ASSISTED LIVING, CONDO TRAILER ETC.: HOUSE       IS IT ONE OR MORE STORIES: 2 STORIES      HOW MANY PERSONS LIVE IN YOUR HOME: 1      DO YOU HAVE PETS IN YOUR HOME: 1 CAT AND A PART TIME DOG       CURRENT OR PAST PROFESSION: CIVIL SERVANT      DO YOU EXERCISE: NO       WHAT TYPE AND HOW OFTEN:NO   Social Determinants of Health   Financial Resource Strain:   . Difficulty of Paying Living Expenses:   Food Insecurity:   . Worried About Programme researcher, broadcasting/film/video in the Last Year:   . Barista in the Last Year:   Transportation Needs:   . Freight forwarder (Medical):   Marland Kitchen Lack of Transportation (Non-Medical):   Physical Activity:   . Days of Exercise per  Week:   . Minutes of Exercise per Session:   Stress:   . Feeling of Stress :   Social Connections:   . Frequency of Communication with Friends and Family:   . Frequency of Social Gatherings with Friends and Family:   . Attends Religious Services:   . Active Member of Clubs or Organizations:   . Attends Banker Meetings:   Marland Kitchen Marital Status:   Intimate Partner Violence:   . Fear of Current or Ex-Partner:   . Emotionally Abused:   Marland Kitchen Physically Abused:   . Sexually Abused:     Family History  Problem Relation Age of Onset  . Hypercalcemia Daughter   . Hypertension Daughter     ROS- All systems are reviewed and negative except as per the HPI above  Physical Exam: Vitals:   09/23/19 1118  BP: 112/74  Pulse: 88  Weight: 65.6 kg  Height: 5\' 3"  (1.6 m)    GEN- The patient is well appearing, alert and oriented x 3 today.   Head- normocephalic, atraumatic Eyes-  Sclera clear, conjunctiva pink Ears- hearing intact Oropharynx- clear Neck- supple, no JVP Lymph- no cervical lymphadenopathy Lungs- Clear to ausculation bilaterally, normal work of breathing Heart- Irregular rate and rhythm, no murmurs, rubs or gallops, PMI not laterally displaced GI- soft, NT, ND, + BS Extremities- no clubbing, cyanosis, or edema MS- no significant deformity or atrophy Skin- no rash or lesion Psych- euthymic mood, full affect Neuro- strength and sensation are intact  EKG-afib at 88 bpm, qrs  int 78 ms, qtc 430  ms Epic records reviewed   Assessment and Plan: 1.Long standing permanent afib Rate controlled Appears to be doing well Continue metoprolol ER 25 mg bid and cardizem 120 mg bid, appears to have HR well controlled with these doses Continue eliquis 5 mg bid, Creatinine last checked 07/09/19, 0.79, weight  over 60 kg, no bleeding issues  2. Hypothyroidism  Per pcp  F/u 6 months  Geroge Baseman. Diksha Tagliaferro, [facility] [address] [place], [place] [address] [phone]

## 2019-10-10 ENCOUNTER — Encounter: Payer: Medicare Other | Admitting: Nurse Practitioner

## 2019-10-24 ENCOUNTER — Other Ambulatory Visit: Payer: Self-pay | Admitting: Nurse Practitioner

## 2019-10-24 DIAGNOSIS — M1991 Primary osteoarthritis, unspecified site: Secondary | ICD-10-CM

## 2019-10-24 DIAGNOSIS — M48062 Spinal stenosis, lumbar region with neurogenic claudication: Secondary | ICD-10-CM

## 2019-10-27 NOTE — Telephone Encounter (Signed)
Last filled in Epic on 08/15/2019  Opioid treatment agreement on file

## 2019-11-05 ENCOUNTER — Other Ambulatory Visit: Payer: Self-pay

## 2019-11-05 ENCOUNTER — Encounter: Payer: Self-pay | Admitting: Nurse Practitioner

## 2019-11-05 ENCOUNTER — Ambulatory Visit (INDEPENDENT_AMBULATORY_CARE_PROVIDER_SITE_OTHER): Payer: Medicare Other | Admitting: Nurse Practitioner

## 2019-11-05 DIAGNOSIS — Z0000 Encounter for general adult medical examination without abnormal findings: Secondary | ICD-10-CM | POA: Diagnosis not present

## 2019-11-05 NOTE — Progress Notes (Signed)
   This service is provided via telemedicine  No vital signs collected/recorded due to the encounter was a telemedicine visit.   Location of patient (ex: home, work):  Home    Patient consents to a telephone visit:  Yes, see telephone encounter dated 11/05/19 with annual consent   Location of the provider (ex: office, home):  [facility], Office   Name of any referring provider:  N/A  Names of all persons participating in the telemedicine service and their role in the encounter:  S.Chrae B/CMA, Abbey Chatters, NP, and Patient   Time spent on call:  8 min with medical assistant

## 2019-11-05 NOTE — Patient Instructions (Signed)
Katrina Velez , Thank you for taking time to come for your Medicare Wellness Visit. I appreciate your ongoing commitment to your health goals. Please review the following plan we discussed and let me know if I can assist you in the future.   Screening recommendations/referrals: Colonoscopy agedout Mammogram aged out Bone Density you have declined Recommended yearly ophthalmology/optometry visit for glaucoma screening and checkup Recommended yearly dental visit for hygiene and checkup  Vaccinations: Influenza vaccine up to date- due Sept 2021 Pneumococcal vaccine up to date Tdap vaccine up to date Shingles vaccine up to date    Advanced directives: on file  Conditions/risks identified: fall risk, advanced age, hearing loss  Next appointment: 1 year   Preventive Care 11 Years and Older, Female Preventive care refers to lifestyle choices and visits with your health care provider that can promote health and wellness. What does preventive care include?  A yearly physical exam. This is also called an annual well check.  Dental exams once or twice a year.  Routine eye exams. Ask your health care provider how often you should have your eyes checked.  Personal lifestyle choices, including:  Daily care of your teeth and gums.  Regular physical activity.  Eating a healthy diet.  Avoiding tobacco and drug use.  Limiting alcohol use.  Practicing safe sex.  Taking low-dose aspirin every day.  Taking vitamin and mineral supplements as recommended by your health care provider. What happens during an annual well check? The services and screenings done by your health care provider during your annual well check will depend on your age, overall health, lifestyle risk factors, and family history of disease. Counseling  Your health care provider may ask you questions about your:  Alcohol use.  Tobacco use.  Drug use.  Emotional well-being.  Home and relationship  well-being.  Sexual activity.  Eating habits.  History of falls.  Memory and ability to understand (cognition).  Work and work Astronomer.  Reproductive health. Screening  You may have the following tests or measurements:  Height, weight, and BMI.  Blood pressure.  Lipid and cholesterol levels. These may be checked every 5 years, or more frequently if you are over 67 years old.  Skin check.  Lung cancer screening. You may have this screening every year starting at age 69 if you have a 30-pack-year history of smoking and currently smoke or have quit within the past 15 years.  Fecal occult blood test (FOBT) of the stool. You may have this test every year starting at age 75.  Flexible sigmoidoscopy or colonoscopy. You may have a sigmoidoscopy every 5 years or a colonoscopy every 10 years starting at age 51.  Hepatitis C blood test.  Hepatitis B blood test.  Sexually transmitted disease (STD) testing.  Diabetes screening. This is done by checking your blood sugar (glucose) after you have not eaten for a while (fasting). You may have this done every 1-3 years.  Bone density scan. This is done to screen for osteoporosis. You may have this done starting at age 68.  Mammogram. This may be done every 1-2 years. Talk to your health care provider about how often you should have regular mammograms. Talk with your health care provider about your test results, treatment options, and if necessary, the need for more tests. Vaccines  Your health care provider may recommend certain vaccines, such as:  Influenza vaccine. This is recommended every year.  Tetanus, diphtheria, and acellular pertussis (Tdap, Td) vaccine. You may need a Td  booster every 10 years.  Zoster vaccine. You may need this after age 55.  Pneumococcal 13-valent conjugate (PCV13) vaccine. One dose is recommended after age 47.  Pneumococcal polysaccharide (PPSV23) vaccine. One dose is recommended after age  39. Talk to your health care provider about which screenings and vaccines you need and how often you need them. This information is not intended to replace advice given to you by your health care provider. Make sure you discuss any questions you have with your health care provider. Document Released: 06/04/2015 Document Revised: 01/26/2016 Document Reviewed: 03/09/2015 Elsevier Interactive Patient Education  2017 [employer] Prevention in the Home Falls can cause injuries. They can happen to people of all ages. There are many things you can do to make your home safe and to help prevent falls. What can I do on the outside of my home?  Regularly fix the edges of walkways and driveways and fix any cracks.  Remove anything that might make you trip as you walk through a door, such as a raised step or threshold.  Trim any bushes or trees on the path to your home.  Use bright outdoor lighting.  Clear any walking paths of anything that might make someone trip, such as rocks or tools.  Regularly check to see if handrails are loose or broken. Make sure that both sides of any steps have handrails.  Any raised decks and porches should have guardrails on the edges.  Have any leaves, snow, or ice cleared regularly.  Use sand or salt on walking paths during winter.  Clean up any spills in your garage right away. This includes oil or grease spills. What can I do in the bathroom?  Use night lights.  Install grab bars by the toilet and in the tub and shower. Do not use towel bars as grab bars.  Use non-skid mats or decals in the tub or shower.  If you need to sit down in the shower, use a plastic, non-slip stool.  Keep the floor dry. Clean up any water that spills on the floor as soon as it happens.  Remove soap buildup in the tub or shower regularly.  Attach bath mats securely with double-sided non-slip rug tape.  Do not have throw rugs and other things on the floor that can make  you trip. What can I do in the bedroom?  Use night lights.  Make sure that you have a light by your bed that is easy to reach.  Do not use any sheets or blankets that are too big for your bed. They should not hang down onto the floor.  Have a firm chair that has side arms. You can use this for support while you get dressed.  Do not have throw rugs and other things on the floor that can make you trip. What can I do in the kitchen?  Clean up any spills right away.  Avoid walking on wet floors.  Keep items that you use a lot in easy-to-reach places.  If you need to reach something above you, use a strong step stool that has a grab bar.  Keep electrical cords out of the way.  Do not use floor polish or wax that makes floors slippery. If you must use wax, use non-skid floor wax.  Do not have throw rugs and other things on the floor that can make you trip. What can I do with my stairs?  Do not leave any items on the stairs.  Make sure that there are handrails on both sides of the stairs and use them. Fix handrails that are broken or loose. Make sure that handrails are as long as the stairways.  Check any carpeting to make sure that it is firmly attached to the stairs. Fix any carpet that is loose or worn.  Avoid having throw rugs at the top or bottom of the stairs. If you do have throw rugs, attach them to the floor with carpet tape.  Make sure that you have a light switch at the top of the stairs and the bottom of the stairs. If you do not have them, ask someone to add them for you. What else can I do to help prevent falls?  Wear shoes that:  Do not have high heels.  Have rubber bottoms.  Are comfortable and fit you well.  Are closed at the toe. Do not wear sandals.  If you use a stepladder:  Make sure that it is fully opened. Do not climb a closed stepladder.  Make sure that both sides of the stepladder are locked into place.  Ask someone to hold it for you, if  possible.  Clearly mark and make sure that you can see:  Any grab bars or handrails.  First and last steps.  Where the edge of each step is.  Use tools that help you move around (mobility aids) if they are needed. These include:  Canes.  Walkers.  Scooters.  Crutches.  Turn on the lights when you go into a dark area. Replace any light bulbs as soon as they burn out.  Set up your furniture so you have a clear path. Avoid moving your furniture around.  If any of your floors are uneven, fix them.  If there are any pets around you, be aware of where they are.  Review your medicines with your doctor. Some medicines can make you feel dizzy. This can increase your chance of falling. Ask your doctor what other things that you can do to help prevent falls. This information is not intended to replace advice given to you by your health care provider. Make sure you discuss any questions you have with your health care provider. Document Released: 03/04/2009 Document Revised: 10/14/2015 Document Reviewed: 06/12/2014 Elsevier Interactive Patient Education  2017 [employer].

## 2019-11-05 NOTE — Progress Notes (Signed)
Subjective:   Katrina Velez is a 86 y.o. female who presents for Medicare Annual (Subsequent) preventive examination.  Review of Systems:   Cardiac Risk Factors include: advanced age (>55men, >86 women);sedentary lifestyle     Objective:     Vitals: There were no vitals taken for this visit.  There is no height or weight on file to calculate BMI.  Advanced Directives 11/05/2019 08/06/2019 07/09/2019 10/04/2018 11/14/2017 10/01/2017 08/31/2017  Does Patient Have a Medical Advance Directive? Yes Yes Yes Yes Yes Yes Yes  Type of Social research officer, government Power of Attorney Living will Healthcare Power of [employer] Power of [place];Living will Healthcare Power of [place];Living will  Does patient want to make changes to medical advance directive? No - Patient declined No - Patient declined No - Patient declined No - Patient declined No - Patient declined No - Patient declined No - Patient declined  Copy of Healthcare Power of Attorney in Chart? Yes - validated most recent copy scanned in chart (See row information) Yes - validated most recent copy scanned in chart (See row information) Yes - validated most recent copy scanned in chart (See row information) - Yes Yes Yes  Would patient like information on creating a medical advance directive? - - - - - - -  Pre-existing out of facility DNR order (yellow form or pink MOST form) - - - - - - -    Tobacco Social History   Tobacco Use  Smoking Status Former Smoker  . Packs/day: 1.00  . Years: 65.00  . Pack years: 65.00  . Types: Cigarettes  . Quit date: 05/28/2011  . Years since quitting: 8.4  Smokeless Tobacco Never Used     Counseling given: Not Answered   Clinical Intake:  Pre-visit preparation completed: Yes  Pain : No/denies pain     BMI - recorded: 25.61 Nutritional Risks: None Diabetes: No  How often do you need to have someone help you when you read  instructions, pamphlets, or other written materials from your doctor or pharmacy?: 1 - Never        Past Medical History:  Diagnosis Date  . A-fib (HCC) 05/2015   HOSPITALIZED   . Arthritis   . Cataract   . Hypothyroidism   . Permanent atrial fibrillation (HCC)   . Thyroid disease    Past Surgical History:  Procedure Laterality Date  . CARDIOVERSION N/A 06/08/2015   Procedure: CARDIOVERSION;  Surgeon: Jake Bathe, MD;  Location: [facility] ENDOSCOPY;  Service: Cardiovascular;  Laterality: N/A;  . CARDIOVERSION N/A 09/02/2015   Procedure: CARDIOVERSION;  Surgeon: Laqueta Linden, MD;  Location: [facility] ENDOSCOPY;  Service: Cardiovascular;  Laterality: N/A;  . CATARACT EXTRACTION, BILATERAL Bilateral 2010  . JOINT REPLACEMENT    . KNEE SURGERY  2002  . right hip replacement    . RIGHT KNEE REPLACEMENT  2011  . TEE WITHOUT CARDIOVERSION N/A 06/08/2015   Procedure: TRANSESOPHAGEAL ECHOCARDIOGRAM (TEE);  Surgeon: Jake Bathe, MD;  Location: [facility] ENDOSCOPY;  Service: Cardiovascular;  Laterality: N/A;  . TOTAL HIP ARTHROPLASTY Right 2011  . TOTAL SHOULDER REPLACEMENT Right 2003  . TOTAL SHOULDER REPLACEMENT Left 2007  . WRIST SURGERY Bilateral 2008   both wrists   Family History  Problem Relation Age of Onset  . Hypercalcemia Daughter   . Hypertension Daughter    Social History   Socioeconomic History  . Marital status: Widowed    Spouse name: Not on file  .  Number of children: Not on file  . Years of education: Not on file  . Highest education level: Not on file  Occupational History  . Not on file  Tobacco Use  . Smoking status: Former Smoker    Packs/day: 1.00    Years: 65.00    Pack years: 65.00    Types: Cigarettes    Quit date: 05/28/2011    Years since quitting: 8.4  . Smokeless tobacco: Never Used  Vaping Use  . Vaping Use: Former  Substance and Sexual Activity  . Alcohol use: Yes    Alcohol/week: 1.0 - 2.0 standard drink    Types: 1 - 2 Glasses of wine per week      Comment: occasional drink  . Drug use: No  . Sexual activity: Not on file  Other Topics Concern  . Not on file  Social History Narrative   DIET: NONE      DO YOU DRINK/EAT THINGS WITH CAFFEINE: YES, COFFEE AND TEA       MARITAL STATUS: WIDOWED      WHAT YEAR WERE YOU MARRIED:1950      DO YOU LIVE IN A HOUSE, APARTMENT, ASSISTED LIVING, CONDO TRAILER ETC.: HOUSE       IS IT ONE OR MORE STORIES: 2 STORIES      HOW MANY PERSONS LIVE IN YOUR HOME: 1      DO YOU HAVE PETS IN YOUR HOME: 1 CAT AND A PART TIME DOG       CURRENT OR PAST PROFESSION: CIVIL SERVANT      DO YOU EXERCISE: NO       WHAT TYPE AND HOW OFTEN:NO   Social Determinants of Health   Financial Resource Strain:   . Difficulty of Paying Living Expenses:   Food Insecurity:   . Worried About Programme researcher, broadcasting/film/video in the Last Year:   . Barista in the Last Year:   Transportation Needs:   . Freight forwarder (Medical):   Marland Kitchen Lack of Transportation (Non-Medical):   Physical Activity:   . Days of Exercise per Week:   . Minutes of Exercise per Session:   Stress:   . Feeling of Stress :   Social Connections:   . Frequency of Communication with Friends and Family:   . Frequency of Social Gatherings with Friends and Family:   . Attends Religious Services:   . Active Member of Clubs or Organizations:   . Attends Banker Meetings:   Marland Kitchen Marital Status:     Outpatient Encounter Medications as of 11/05/2019  Medication Sig  . CARDIZEM LA 120 MG 24 hr tablet TAKE 1 TABLET (120 MG TOTAL) BY MOUTH 2 (TWO) TIMES DAILY.  Marland Kitchen ELIQUIS 5 MG TABS tablet TAKE 1 TABLET BY MOUTH TWICE A DAY  . furosemide (LASIX) 20 MG tablet TAKE 1 TABLET DAILY AS NEEDED FOR WEIGHT GAIN >3LBS.  Marland Kitchen metoprolol succinate (TOPROL-XL) 25 MG 24 hr tablet TAKE 1 TABLET BY MOUTH TWICE A DAY  . SYNTHROID 112 MCG tablet Take 1 tablet (112 mcg total) by mouth daily before breakfast.  . traMADol (ULTRAM) 50 MG tablet TAKE 1 TABLET (50  MG TOTAL) BY MOUTH DAILY AS NEEDED.  . [DISCONTINUED] ascorbic acid (VITAMIN C) 500 MG tablet Take 1,000 mg by mouth daily. (Patient not taking: Reported on 11/05/2019)  . [DISCONTINUED] cholecalciferol (VITAMIN D) 1000 units tablet Take 1,000 Units by mouth daily. (Patient not taking: Reported on 11/05/2019)  . [DISCONTINUED] Multiple  Vitamin (MULTIVITAMIN) capsule Take 1 capsule by mouth daily. (Patient not taking: Reported on 11/05/2019)  . [DISCONTINUED] vitamin E 400 UNIT capsule Take 400 Units by mouth daily. (Patient not taking: Reported on 11/05/2019)   No facility-administered encounter medications on file as of 11/05/2019.    Activities of Daily Living In your present state of health, do you have any difficulty performing the following activities: 11/05/2019  Hearing? Y  Vision? N  Difficulty concentrating or making decisions? N  Walking or climbing stairs? Y  Dressing or bathing? N  Doing errands, shopping? N  Comment daughter helps  Conservation officer, nature and eating ? N  Using the Toilet? N  In the past six months, have you accidently leaked urine? Y  Do you have problems with loss of bowel control? N  Managing your Medications? N  Managing your Finances? N  Housekeeping or managing your Housekeeping? N  Some recent data might be hidden    Patient Care Team: Lauree Chandler, NP as PCP - General (Geriatric Medicine) Warden Fillers, MD as Consulting Physician (Ophthalmology) Sherran Needs, NP as Nurse Practitioner (Cardiology)    Assessment:   This is a routine wellness examination for Letzy.  Exercise Activities and Dietary recommendations Current Exercise Habits: The patient does not participate in regular exercise at present  Goals    . Increase water intake     Starting today I will increase my water intake slowly day by day.        Fall Risk Fall Risk  11/05/2019 07/09/2019 05/26/2019 01/15/2019 01/03/2019  Falls in the past year? 0 1 1 1 1   Number falls in past  yr: 0 0 0 0 1  Injury with Fall? 0 0 1 1 1   Comment - - - Stiches and brused boy skni tears on arm and leg and broke pinky -   Is the patient's home free of loose throw rugs in walkways, pet beds, electrical cords, etc?   no      Grab bars in the bathroom? yes      Handrails on the stairs?   yes      Adequate lighting?   yes  Timed Get Up and Go performed: na  Depression Screen PHQ 2/9 Scores 11/05/2019 07/09/2019 01/15/2019 10/04/2018  PHQ - 2 Score 0 0 0 0  PHQ- 9 Score - - - -     Cognitive Function MMSE - Mini Mental State Exam 10/01/2017 09/29/2016  Orientation to time 5 5  Orientation to Place 5 5  Registration 3 3  Attention/ Calculation 5 5  Recall 2 2  Language- name 2 objects 2 2  Language- repeat 1 1  Language- follow 3 step command 3 3  Language- read & follow direction 1 1  Write a sentence 1 1  Copy design 1 1  Total score 29 29     6CIT Screen 11/05/2019 10/04/2018  What Year? 0 points 0 points  What month? 0 points 0 points  What time? 0 points 0 points  Count back from 20 0 points 0 points  Months in reverse 0 points 0 points  Repeat phrase 2 points 2 points  Total Score 2 2    Immunization History  Administered Date(s) Administered  . Fluad Quad(high Dose 65+) 03/15/2019  . Influenza Split 03/25/2012  . Influenza, High Dose Seasonal PF 03/28/2018  . Influenza,inj,Quad PF,6+ Mos 02/24/2013, 03/15/2015, 02/25/2016  . Influenza-Unspecified 05/01/2014, 03/19/2017  . PFIZER SARS-COV-2 Vaccination 06/03/2019, 06/22/2019  . Pneumococcal  Conjugate-13 05/27/2014  . Pneumococcal Polysaccharide-23 02/25/2016  . Tdap 10/10/2016  . Zoster 05/22/2001  . Zoster Recombinat (Shingrix) 08/31/2017, 11/07/2017    Qualifies for Shingles Vaccine? Up to date  Screening Tests Health Maintenance  Topic Date Due  . INFLUENZA VACCINE  12/21/2019  . TETANUS/TDAP  10/11/2026  . DEXA SCAN  Completed  . COVID-19 Vaccine  Completed  . PNA vac Low Risk Adult  Completed     Cancer Screenings: Lung: Low Dose CT Chest recommended if Age 83-80 years, 30 pack-year currently smoking OR have quit w/in 15years. Patient does not qualify. Breast:  Up to date on Mammogram? Aged out Up to date of Bone Density/Dexa? No- pt has declined Colorectal: aged out  Additional Screenings:  Hepatitis C Screening: na     Plan:      I have personally reviewed and noted the following in the patient's chart:   . Medical and social history . Use of alcohol, tobacco or illicit drugs  . Current medications and supplements . Functional ability and status . Nutritional status . Physical activity . Advanced directives . List of other physicians . Hospitalizations, surgeries, and ER visits in previous 12 months . Vitals . Screenings to include cognitive, depression, and falls . Referrals and appointments  In addition, I have reviewed and discussed with patient certain preventive protocols, quality metrics, and best practice recommendations. A written personalized care plan for preventive services as well as general preventive health recommendations were provided to patient.     Sharon Seller, NP  11/05/2019

## 2019-11-07 ENCOUNTER — Telehealth: Payer: Self-pay

## 2019-11-07 NOTE — Telephone Encounter (Signed)
Katrina Velez, Katrina Velez are scheduled for a virtual visit with your provider today.    Just as we do with appointments in the office, we must obtain your consent to participate.  Your consent will be active for this visit and any virtual visit you may have with one of our providers in the next 365 days.    If you have a MyChart account, I can also send a copy of this consent to you electronically.  All virtual visits are billed to your insurance company just like a traditional visit in the office.  As this is a virtual visit, video technology does not allow for your provider to perform a traditional examination.  This may limit your provider's ability to fully assess your condition.  If your provider identifies any concerns that need to be evaluated in person or the need to arrange testing such as labs, EKG, etc, we will make arrangements to do so.    Although advances in technology are sophisticated, we cannot ensure that it will always work on either your end or our end.  If the connection with a video visit is poor, we may have to switch to a telephone visit.  With either a video or telephone visit, we are not always able to ensure that we have a secure connection.   I need to obtain your verbal consent now.   Are you willing to proceed with your visit today?   Katrina Velez has provided verbal consent on 11/07/2019 for a virtual visit (video or telephone).   Katrina Velez [place], [place] 11/05/2019  10:00 am

## 2019-11-12 ENCOUNTER — Ambulatory Visit (INDEPENDENT_AMBULATORY_CARE_PROVIDER_SITE_OTHER): Payer: Medicare Other | Admitting: Family

## 2019-11-12 ENCOUNTER — Other Ambulatory Visit: Payer: Self-pay

## 2019-11-12 ENCOUNTER — Encounter: Payer: Self-pay | Admitting: Family

## 2019-11-12 VITALS — BP 110/82 | HR 86 | Temp 97.8°F | Resp 16 | Ht 63.0 in | Wt 144.4 lb

## 2019-11-12 DIAGNOSIS — E034 Atrophy of thyroid (acquired): Secondary | ICD-10-CM | POA: Diagnosis not present

## 2019-11-12 DIAGNOSIS — K5901 Slow transit constipation: Secondary | ICD-10-CM | POA: Diagnosis not present

## 2019-11-12 LAB — TSH: TSH: 0.33 mIU/L — ABNORMAL LOW (ref 0.40–4.50)

## 2019-11-12 MED ORDER — DOCUSATE SODIUM 100 MG PO CAPS: 100.0000 mg | ORAL_CAPSULE | Freq: Every day | ORAL | 0 refills | Status: DC | PRN

## 2019-11-12 NOTE — Progress Notes (Signed)
Provider: Juliani Laduke FNP-C  Sharon Seller, NP  Patient Care Team: Sharon Seller, NP as PCP - General (Geriatric Medicine) Sallye Lat, MD as Consulting Physician (Ophthalmology) Newman Nip, NP as Nurse Practitioner (Cardiology)  Extended Emergency Contact Information Primary Emergency Contact: [facility] Address: [address] [place], [place] [address] [place] of [place] Home Phone: [phone] Mobile Phone: [phone] Relation: Daughter Secondary Emergency Contact: Vanice [place] of [place] Home Phone: [phone] Mobile Phone: [phone] Relation: Son  Code Status: Full Code  Goals of care: Advanced Directive information Advanced Directives 11/12/2019  Does Patient Have a Medical Advance Directive? Yes  Type of Advance Directive Living will;Healthcare Power of Attorney  Does patient want to make changes to medical advance directive? No - Patient declined  Copy of Healthcare Power of Attorney in Chart? Yes - validated most recent copy scanned in chart (See row information)  Would patient like information on creating a medical advance directive? -  Pre-existing out of facility DNR order (yellow form or pink MOST form) -     Chief Complaint  Patient presents with  . Acute Visit    Wants a blood test for TSH because not feeling well.    HPI:  Pt is a 86 y.o. female seen today for an acute visit for evaluation of hypothyroidism.she is here with her daughter.she brought a printed paper with symptoms of hypothyroidism.Daughter states patient has all the symptoms on the paper except ammonirrhea.Feels tired,falls asleep on the couch,fingernails are broken and chipping.Keeps filling them.Having aches and pains which could be from her Osteoarthritis too.Has constipation,Has had bald spots on her head.skin is super dry.No weight loss.Has been depressed and crampy.she states upset that her previous levothyroxine was reduce  due to low TSH level  0.37 previous was 0.40 she states does well when her levothyroxine dose is 175 mcg tablet.she does not want to base on her TSH level.Daughter states every Doctor has based dosage on TSH level but wants it based on patient's symptoms.recommended evaluation by Endocrinology but became more upset states " No!No no! That's the one  Who messed my dosage".she also brought in a long letter written to previous doctors concerning her levothyroxine dosage unable to completely review letter during visit provider had other patient's awaiting.she wants her TSH level checked this visit.This will be difficulty to manage would really benefit with referral to endocrinology which she has refused.    Past Medical History:  Diagnosis Date  . A-fib (HCC) 05/2015   HOSPITALIZED   . Arthritis   . Cataract   . Hypothyroidism   . Permanent atrial fibrillation (HCC)   . Thyroid disease    Past Surgical History:  Procedure Laterality Date  . CARDIOVERSION N/A 06/08/2015   Procedure: CARDIOVERSION;  Surgeon: Jake Bathe, MD;  Location: [facility] ENDOSCOPY;  Service: Cardiovascular;  Laterality: N/A;  . CARDIOVERSION N/A 09/02/2015   Procedure: CARDIOVERSION;  Surgeon: Laqueta Linden, MD;  Location: [facility] ENDOSCOPY;  Service: Cardiovascular;  Laterality: N/A;  . CATARACT EXTRACTION, BILATERAL Bilateral 2010  . JOINT REPLACEMENT    . KNEE SURGERY  2002  . right hip replacement    . RIGHT KNEE REPLACEMENT  2011  . TEE WITHOUT CARDIOVERSION N/A 06/08/2015   Procedure: TRANSESOPHAGEAL ECHOCARDIOGRAM (TEE);  Surgeon: Jake Bathe, MD;  Location: [facility] ENDOSCOPY;  Service: Cardiovascular;  Laterality: N/A;  . TOTAL HIP ARTHROPLASTY Right 2011  . TOTAL SHOULDER REPLACEMENT Right 2003  .  TOTAL SHOULDER REPLACEMENT Left 2007  . WRIST SURGERY Bilateral 2008   both wrists    Allergies  Allergen Reactions  . Banana Nausea Only    All banana products also  . Penicillins Hives    Outpatient Encounter  Medications as of 11/12/2019  Medication Sig  . CARDIZEM LA 120 MG 24 hr tablet TAKE 1 TABLET (120 MG TOTAL) BY MOUTH 2 (TWO) TIMES DAILY.  Marland Kitchen ELIQUIS 5 MG TABS tablet TAKE 1 TABLET BY MOUTH TWICE A DAY  . furosemide (LASIX) 20 MG tablet TAKE 1 TABLET DAILY AS NEEDED FOR WEIGHT GAIN >3LBS.  Marland Kitchen metoprolol succinate (TOPROL-XL) 25 MG 24 hr tablet TAKE 1 TABLET BY MOUTH TWICE A DAY  . SYNTHROID 112 MCG tablet Take 1 tablet (112 mcg total) by mouth daily before breakfast.  . traMADol (ULTRAM) 50 MG tablet TAKE 1 TABLET (50 MG TOTAL) BY MOUTH DAILY AS NEEDED.   No facility-administered encounter medications on file as of 11/12/2019.    Review of Systems  Constitutional: Positive for fatigue. Negative for appetite change, chills and fever.  HENT: Negative for congestion, rhinorrhea, sinus pressure, sinus pain, sneezing and sore throat.   Eyes: Negative for discharge, redness and itching.  Respiratory: Negative for cough, chest tightness, shortness of breath and wheezing.   Cardiovascular: Negative for chest pain, palpitations and leg swelling.  Gastrointestinal: Positive for constipation. Negative for abdominal distention, abdominal pain, diarrhea, nausea and vomiting.  Endocrine: Negative for cold intolerance, heat intolerance, polydipsia, polyphagia and polyuria.  Genitourinary: Negative for difficulty urinating, dysuria, flank pain, frequency and urgency.  Musculoskeletal: Positive for arthralgias and gait problem. Negative for joint swelling.  Skin: Negative for color change, pallor and rash.  Neurological: Negative for dizziness, weakness, light-headedness, numbness and headaches.  Hematological: Does not bruise/bleed easily.  Psychiatric/Behavioral: Negative for sleep disturbance. The patient is not nervous/anxious.        Easily agitated    Immunization History  Administered Date(s) Administered  . Fluad Quad(high Dose 65+) 03/15/2019  . Influenza Split 03/25/2012  . Influenza, High  Dose Seasonal PF 03/28/2018  . Influenza,inj,Quad PF,6+ Mos 02/24/2013, 03/15/2015, 02/25/2016  . Influenza-Unspecified 05/01/2014, 03/19/2017  . PFIZER SARS-COV-2 Vaccination 06/03/2019, 06/22/2019  . Pneumococcal Conjugate-13 05/27/2014  . Pneumococcal Polysaccharide-23 02/25/2016  . Tdap 10/10/2016  . Zoster 05/22/2001  . Zoster Recombinat (Shingrix) 08/31/2017, 11/07/2017   Pertinent  Health Maintenance Due  Topic Date Due  . INFLUENZA VACCINE  12/21/2019  . DEXA SCAN  Completed  . PNA vac Low Risk Adult  Completed   Fall Risk  11/12/2019 11/05/2019 07/09/2019 05/26/2019 01/15/2019  Falls in the past year? 0 0 1 1 1   Number falls in past yr: 0 0 0 0 0  Injury with Fall? 0 0 0 1 1  Comment - - - - Stiches and brused boy skni tears on arm and leg and broke pinky    Vitals:   11/12/19 1520  BP: 110/82  Pulse: 86  Resp: 16  Temp: 97.8 F (36.6 C)  SpO2: 96%  Weight: 144 lb 6.4 oz (65.5 kg)  Height: 5\' 3"  (1.6 m)   Body mass index is 25.58 kg/m. Physical Exam Vitals reviewed.  Constitutional:      General: She is not in acute distress.    Appearance: She is overweight. She is not ill-appearing.  HENT:     Head: Normocephalic.     Ears:     Comments: Hearing aids in place  Eyes:  General: No scleral icterus.       Right eye: No discharge.        Left eye: No discharge.     Conjunctiva/sclera: Conjunctivae normal.     Pupils: Pupils are equal, round, and reactive to light.  Neck:     Vascular: No carotid bruit.  Cardiovascular:     Rate and Rhythm: Normal rate. Rhythm irregular.     Pulses: Normal pulses.     Heart sounds: Normal heart sounds. No murmur heard.  No friction rub. No gallop.   Pulmonary:     Effort: Pulmonary effort is normal. No respiratory distress.     Breath sounds: Normal breath sounds. No wheezing, rhonchi or rales.  Chest:     Chest wall: No tenderness.  Abdominal:     General: Bowel sounds are normal. There is no distension.      Palpations: Abdomen is soft. There is no mass.     Tenderness: There is no abdominal tenderness. There is no right CVA tenderness, left CVA tenderness, guarding or rebound.  Musculoskeletal:        General: No swelling or tenderness.     Cervical back: Normal range of motion. No rigidity or tenderness.     Comments: Walks with a cane.trace edema.   Lymphadenopathy:     Cervical: No cervical adenopathy.  Skin:    General: Skin is warm and dry.     Findings: No bruising, erythema or rash.  Neurological:     Mental Status: She is alert and oriented to person, place, and time.     Sensory: No sensory deficit.     Motor: No weakness.     Gait: Gait abnormal.  Psychiatric:        Mood and Affect: Mood normal.        Speech: Speech normal.     Comments: Easily irritable     Labs reviewed: Recent Labs    07/09/19 1438  NA 141  K 4.6  CL 104  CO2 28  GLUCOSE 93  BUN 19  CREATININE 0.79  CALCIUM 10.3   Recent Labs    07/09/19 1438  AST 14  ALT 8  BILITOT 0.5  PROT 6.5   Recent Labs    01/03/19 1127 07/09/19 1438  WBC 8.6 6.7  NEUTROABS 6,502 4,201  HGB 12.7 13.7  HCT 38.5 41.2  MCV 89.7 89.6  PLT 467* 308   Lab Results  Component Value Date   TSH 0.37 (L) 07/09/2019   No results found for: HGBA1C Lab Results  Component Value Date   CHOL 193 05/27/2014   HDL 66 05/27/2014   LDLCALC 106 (H) 05/27/2014   TRIG 107 05/27/2014   CHOLHDL 2.9 05/27/2014    Significant Diagnostic Results in last 30 days:  No results found.  Assessment/Plan 1. Hypothyroidism due to acquired atrophy of thyroid Lab Results  Component Value Date   TSH 0.37 (L) 07/09/2019  - continue on levothyroxine 112 mcg tablet daily on an empty stomach.difficult to manage request dose adjustment to be based on her symptoms instead of her TSH level.will real recommend follow up with Endocrinologist.  - TSH  2. Slow transit constipation Bought colace but has not used.Reports LBM today soft.    - advised to increase water intake to 6-8 glasses daily,increase fiber in diet  - docusate sodium (COLACE) 100 MG capsule; Take 1 capsule (100 mg total) by mouth daily as needed for mild constipation.  Dispense: 10 capsule; Refill:  0 Notify provider if no improvement with use of stool softener.will add Miralx if colace ineffective.   Family/ staff Communication: Reviewed plan of care with patient and daughter verbalized understanding.   Labs/tests ordered: - TSH level   Next Appointment: As needed if symptoms worsen or fail to improve.   Caesar Bookman, NP

## 2019-11-12 NOTE — Patient Instructions (Signed)
TSH level order today will call you with results

## 2019-11-20 ENCOUNTER — Other Ambulatory Visit: Payer: Self-pay | Admitting: Internal Medicine

## 2019-11-20 DIAGNOSIS — M1991 Primary osteoarthritis, unspecified site: Secondary | ICD-10-CM

## 2019-11-20 DIAGNOSIS — M48062 Spinal stenosis, lumbar region with neurogenic claudication: Secondary | ICD-10-CM

## 2019-11-26 ENCOUNTER — Ambulatory Visit: Payer: Medicare Other | Admitting: Nurse Practitioner

## 2019-11-27 ENCOUNTER — Ambulatory Visit (INDEPENDENT_AMBULATORY_CARE_PROVIDER_SITE_OTHER): Payer: Medicare Other | Admitting: Internal Medicine

## 2019-11-27 ENCOUNTER — Encounter: Payer: Self-pay | Admitting: Internal Medicine

## 2019-11-27 ENCOUNTER — Other Ambulatory Visit: Payer: Self-pay

## 2019-11-27 VITALS — BP 138/82 | HR 71 | Temp 97.5°F | Ht 63.0 in | Wt 144.0 lb

## 2019-11-27 DIAGNOSIS — E039 Hypothyroidism, unspecified: Secondary | ICD-10-CM | POA: Diagnosis not present

## 2019-11-27 DIAGNOSIS — K5901 Slow transit constipation: Secondary | ICD-10-CM

## 2019-11-27 DIAGNOSIS — M1991 Primary osteoarthritis, unspecified site: Secondary | ICD-10-CM

## 2019-11-27 DIAGNOSIS — F339 Major depressive disorder, recurrent, unspecified: Secondary | ICD-10-CM

## 2019-11-27 DIAGNOSIS — M48062 Spinal stenosis, lumbar region with neurogenic claudication: Secondary | ICD-10-CM | POA: Diagnosis not present

## 2019-11-27 DIAGNOSIS — I480 Paroxysmal atrial fibrillation: Secondary | ICD-10-CM | POA: Diagnosis not present

## 2019-11-27 DIAGNOSIS — L659 Nonscarring hair loss, unspecified: Secondary | ICD-10-CM

## 2019-11-27 MED ORDER — TRAMADOL HCL 50 MG PO TABS: 50.0000 mg | ORAL_TABLET | Freq: Two times a day (BID) | ORAL | 0 refills | Status: DC | PRN

## 2019-11-27 NOTE — Progress Notes (Signed)
Location:  [facility] clinic Provider:  Jabier Deese L. Renato Gails, D.O., C.M.D.  Goals of Care:  Advanced Directives 11/27/2019  Does Patient Have a Medical Advance Directive? -  Type of Estate agent of [place];Out of facility DNR (pink MOST or yellow form);Living will  Does patient want to make changes to medical advance directive? No - Patient declined  Copy of Healthcare Power of Attorney in Chart? No - copy requested  Would patient like information on creating a medical advance directive? -  Pre-existing out of facility DNR order (yellow form or pink MOST form) -   Chief Complaint  Patient presents with  . Acute Visit    TSH Value concerns about levothyroxine dosing     HPI: Patient is a 86 y.o. female seen today for abnormal TSH and ongoing hypothyroid symptoms.  She forgets what she's going to say so she wrote it down.    She's been having sleepless nights--hard to go back to sleep.  Has lost half of her hair.  Fingernails chip and break.  Has to file them all of the time.  She's lost her appetite.  Poor attitude--had always been optimistic.  She's very tired, wakes up tired.  Constipation.  She's cold wearing a sweater.  Others wonder why she's sitting in the heat.  Says doctors are worried only about her numbers, but not her symptoms.  Shot got to a point at one time where she could barely walk or talk.  One doctor thought she was mentally unhinged.  Went to psychiatrist and he gave her a supplemental dose of levothyroxine.  She is 89.  Wants to have the synthroid she needs.  She requests her dose increased to where she felt her best.    She does not want more operations.  Does not want to mess around.  She wants to get rid of these symptoms.  Her daughter reports she's watched her mom struggle since she was a kid.  She herself had depression and turned out her thyroid was low.    She's had afib anyway which is under control.  Goes to afib clinic.  HR 71.  It was 170  when they found out she was in afib.  They had tried to cardioversion but it was not a success.    No osteoporosis.    Past Medical History:  Diagnosis Date  . A-fib (HCC) 05/2015   HOSPITALIZED   . Arthritis   . Cataract   . Hypothyroidism   . Permanent atrial fibrillation (HCC)   . Thyroid disease     Past Surgical History:  Procedure Laterality Date  . CARDIOVERSION N/A 06/08/2015   Procedure: CARDIOVERSION;  Surgeon: Jake Bathe, MD;  Location: [facility] ENDOSCOPY;  Service: Cardiovascular;  Laterality: N/A;  . CARDIOVERSION N/A 09/02/2015   Procedure: CARDIOVERSION;  Surgeon: Laqueta Linden, MD;  Location: [facility] ENDOSCOPY;  Service: Cardiovascular;  Laterality: N/A;  . CATARACT EXTRACTION, BILATERAL Bilateral 2010  . JOINT REPLACEMENT    . KNEE SURGERY  2002  . right hip replacement    . RIGHT KNEE REPLACEMENT  2011  . TEE WITHOUT CARDIOVERSION N/A 06/08/2015   Procedure: TRANSESOPHAGEAL ECHOCARDIOGRAM (TEE);  Surgeon: Jake Bathe, MD;  Location: [facility] ENDOSCOPY;  Service: Cardiovascular;  Laterality: N/A;  . TOTAL HIP ARTHROPLASTY Right 2011  . TOTAL SHOULDER REPLACEMENT Right 2003  . TOTAL SHOULDER REPLACEMENT Left 2007  . WRIST SURGERY Bilateral 2008   both wrists    Allergies  Allergen  Reactions  . Banana Nausea Only    All banana products also  . Penicillins Hives    Outpatient Encounter Medications as of 11/27/2019  Medication Sig  . CARDIZEM LA 120 MG 24 hr tablet TAKE 1 TABLET (120 MG TOTAL) BY MOUTH 2 (TWO) TIMES DAILY.  Marland Kitchen docusate sodium (COLACE) 100 MG capsule Take 1 capsule (100 mg total) by mouth daily as needed for mild constipation.  Marland Kitchen ELIQUIS 5 MG TABS tablet TAKE 1 TABLET BY MOUTH TWICE A DAY  . furosemide (LASIX) 20 MG tablet TAKE 1 TABLET DAILY AS NEEDED FOR WEIGHT GAIN >3LBS.  Marland Kitchen levothyroxine (SYNTHROID) 100 MCG tablet Take 100 mcg by mouth daily before breakfast.  . metoprolol succinate (TOPROL-XL) 25 MG 24 hr tablet TAKE 1 TABLET BY MOUTH TWICE A DAY   . traMADol (ULTRAM) 50 MG tablet TAKE 1 TABLET (50 MG TOTAL) BY MOUTH DAILY AS NEEDED.   No facility-administered encounter medications on file as of 11/27/2019.    Review of Systems:  Review of Systems  Constitutional: Positive for malaise/fatigue. Negative for chills and fever.  HENT: Positive for hearing loss. Negative for congestion and sore throat.   Eyes: Negative for blurred vision.       Glasses  Respiratory: Negative for cough and shortness of breath.   Cardiovascular: Negative for chest pain, palpitations and leg swelling.  Gastrointestinal: Positive for constipation. Negative for abdominal pain.  Genitourinary: Negative for dysuria.  Skin: Positive for itching. Negative for rash.       Dry scaly skin  Neurological: Positive for weakness. Negative for dizziness and loss of consciousness.  Endo/Heme/Allergies: Bruises/bleeds easily.  Psychiatric/Behavioral: Positive for depression.    Health Maintenance  Topic Date Due  . INFLUENZA VACCINE  12/21/2019  . TETANUS/TDAP  10/11/2026  . DEXA SCAN  Completed  . COVID-19 Vaccine  Completed  . PNA vac Low Risk Adult  Completed    Physical Exam: Vitals:   11/27/19 1456  BP: 138/82  Pulse: 71  Temp: (!) 97.5 F (36.4 C)  TempSrc: Temporal  SpO2: 98%  Weight: 144 lb (65.3 kg)  Height: 5\' 3"  (1.6 m)   Body mass index is 25.51 kg/m. Physical Exam Vitals reviewed.  Constitutional:      Appearance: Normal appearance.  HENT:     Head: Normocephalic and atraumatic.  Eyes:     Comments: glasses  Neck:     Comments: No palpable thyroid nodules, masses, adenopathy Cardiovascular:     Rate and Rhythm: Rhythm irregular.     Heart sounds: Murmur heard.   Pulmonary:     Effort: Pulmonary effort is normal.     Breath sounds: Normal breath sounds. No rales.  Abdominal:     General: Bowel sounds are normal.     Palpations: Abdomen is soft.  Musculoskeletal:        General: Normal range of motion.     Cervical back:  Neck supple.     Right lower leg: No edema.     Left lower leg: No edema.  Skin:    General: Skin is warm.     Comments: Hair thinning  Neurological:     General: No focal deficit present.     Mental Status: She is alert and oriented to person, place, and time.  Psychiatric:     Comments: Tearful several times and feels she is not listened to     Labs reviewed: Basic Metabolic Panel: Recent Labs    07/09/19 1438 11/12/19 1620  NA 141  --   K 4.6  --   CL 104  --   CO2 28  --   GLUCOSE 93  --   BUN 19  --   CREATININE 0.79  --   CALCIUM 10.3  --   TSH 0.37* 0.33*   Liver Function Tests: Recent Labs    07/09/19 1438  AST 14  ALT 8  BILITOT 0.5  PROT 6.5   No results for input(s): LIPASE, AMYLASE in the last 8760 hours. No results for input(s): AMMONIA in the last 8760 hours. CBC: Recent Labs    01/03/19 1127 07/09/19 1438  WBC 8.6 6.7  NEUTROABS 6,502 4,201  HGB 12.7 13.7  HCT 38.5 41.2  MCV 89.7 89.6  PLT 467* 308   Lipid Panel: No results for input(s): CHOL, HDL, LDLCALC, TRIG, CHOLHDL, LDLDIRECT in the last 8760 hours. No results found for: HGBA1C  Procedures since last visit: No results found.  Assessment/Plan 1. Hypothyroidism, unspecified type -currently a little hyperthyroid on labs but is requesting to go back up to her dose which is when she felt best -risks of this discussed with her -I have reached out to her cardiology NP to get her input at pt's request -explained that it's risky to take more medication than recommended due to potential rapid afib, heart attack risk -pt understands this but does not seem to think it will happen and she will just feel better -All of her doctors and nurse practitioners are concerned about this risk -She seems to be requesting palliative approach but it is not guideline based  2. Spinal stenosis of lumbar region with neurogenic claudication -cont current mgt - traMADol (ULTRAM) 50 MG tablet; Take  1 tablet (50 mg total) by mouth 2 (two) times daily as needed for moderate pain or severe pain.  Dispense: 60 tablet; Refill: 0  3. Primary osteoarthritis, unspecified site - continue tylenol, topicals and tramadol when severe - traMADol (ULTRAM) 50 MG tablet; Take 1 tablet (50 mg total) by mouth 2 (two) times daily as needed for moderate pain or severe pain.  Dispense: 60 tablet; Refill: 0  4. PAF (paroxysmal atrial fibrillation) (HCC) -rate is controlled at present, but I'm concerned that will not be the case if we do up her levothyroxine  5. Slow transit constipation -cont current bowel regimen, hydrate, fiber, move more  6. Depression, recurrent (HCC) -also ongoing, not certain it's due to her thyroid, not on meds for this either  7. Hair thinning -may be age-related as most of my patients have this same concern  Labs/tests ordered:  No new Next appt:  01/07/2020--as scheduled  Brylen Wagar L. Yousof Alderman, D.O. Geriatrics [facility] [address], [place] [address] Cell Phone (Mon-Fri 8am-5pm):  [phone] On Call:  [phone] & follow prompts after 5pm & weekends Office Phone:  [phone] Office Fax:  [phone]

## 2019-12-05 ENCOUNTER — Other Ambulatory Visit: Payer: Self-pay

## 2019-12-05 ENCOUNTER — Emergency Department (HOSPITAL_COMMUNITY)
Admission: EM | Admit: 2019-12-05 | Discharge: 2019-12-05 | Disposition: A | Discharge status: Home / Self Care | Payer: Medicare Other | Attending: Emergency Medicine | Admitting: Emergency Medicine

## 2019-12-05 ENCOUNTER — Encounter: Payer: Self-pay | Admitting: Family

## 2019-12-05 ENCOUNTER — Ambulatory Visit (INDEPENDENT_AMBULATORY_CARE_PROVIDER_SITE_OTHER): Payer: Medicare Other | Admitting: Family

## 2019-12-05 ENCOUNTER — Encounter (HOSPITAL_COMMUNITY): Payer: Self-pay

## 2019-12-05 VITALS — BP 116/70 | HR 73 | Temp 96.2°F | Ht 63.0 in | Wt 143.0 lb

## 2019-12-05 DIAGNOSIS — Z96641 Presence of right artificial hip joint: Secondary | ICD-10-CM | POA: Insufficient documentation

## 2019-12-05 DIAGNOSIS — Z96611 Presence of right artificial shoulder joint: Secondary | ICD-10-CM | POA: Diagnosis not present

## 2019-12-05 DIAGNOSIS — H1132 Conjunctival hemorrhage, left eye: Secondary | ICD-10-CM | POA: Diagnosis not present

## 2019-12-05 DIAGNOSIS — Z79890 Hormone replacement therapy (postmenopausal): Secondary | ICD-10-CM | POA: Diagnosis not present

## 2019-12-05 DIAGNOSIS — H5789 Other specified disorders of eye and adnexa: Secondary | ICD-10-CM | POA: Diagnosis not present

## 2019-12-05 DIAGNOSIS — E039 Hypothyroidism, unspecified: Secondary | ICD-10-CM | POA: Insufficient documentation

## 2019-12-05 DIAGNOSIS — Z7901 Long term (current) use of anticoagulants: Secondary | ICD-10-CM | POA: Insufficient documentation

## 2019-12-05 DIAGNOSIS — I4891 Unspecified atrial fibrillation: Secondary | ICD-10-CM | POA: Insufficient documentation

## 2019-12-05 DIAGNOSIS — Z87891 Personal history of nicotine dependence: Secondary | ICD-10-CM | POA: Diagnosis not present

## 2019-12-05 DIAGNOSIS — Z96651 Presence of right artificial knee joint: Secondary | ICD-10-CM | POA: Insufficient documentation

## 2019-12-05 DIAGNOSIS — Z96612 Presence of left artificial shoulder joint: Secondary | ICD-10-CM | POA: Diagnosis not present

## 2019-12-05 NOTE — Progress Notes (Signed)
Provider: Branch Pacitti FNP-C  Sharon Seller, NP  Patient Care Team: Sharon Seller, NP as PCP - General (Geriatric Medicine) Sallye Lat, MD as Consulting Physician (Ophthalmology) Newman Nip, NP as Nurse Practitioner (Cardiology)  Extended Emergency Contact Information Primary Emergency Contact: [facility] Address: [address] [place], [place] [address] [place] of [place] Home Phone: [phone] Mobile Phone: [phone] Relation: Daughter Secondary Emergency Contact: Vanice [place] of [place] Home Phone: [phone] Mobile Phone: [phone] Relation: Son  Code Status:  DNR Goals of care: Advanced Directive information Advanced Directives 11/27/2019  Does Patient Have a Medical Advance Directive? -  Type of Estate agent of [place];Out of facility DNR (pink MOST or yellow form);Living will  Does patient want to make changes to medical advance directive? No - Patient declined  Copy of Healthcare Power of Attorney in Chart? No - copy requested  Would patient like information on creating a medical advance directive? -  Pre-existing out of facility DNR order (yellow form or pink MOST form) -     Chief Complaint  Patient presents with   Acute Visit    Examine left eye, eye is discolored. Patient denies vision changes or injury to eye. Eye Dr., Dr.Groat is closed. Patient states the left eye has caused her issues in the past.     HPI:  Pt is a 86 y.o. female seen today for an acute visit for evaluation of left eye redness.she states eye felt irritated and painful.No drainage.she looked the mirror and found left eye was red.Unclear what cause the redness but states ever since she had cataract surgery the left eye has given her trouble.when her daughter came home from work saw the redness in her eye and brought her in for evaluation.She denies any vision changes or injury to eye. She see ophthalmologist  Dr.Groat but office was closed today.she denies any dizziness or blurry vision.she has chronic right side headaches.     Past Medical History:  Diagnosis Date   A-fib (HCC) 05/2015   HOSPITALIZED    Arthritis    Cataract    Hypothyroidism    Permanent atrial fibrillation (HCC)    Thyroid disease    Past Surgical History:  Procedure Laterality Date   CARDIOVERSION N/A 06/08/2015   Procedure: CARDIOVERSION;  Surgeon: Jake Bathe, MD;  Location: [facility] ENDOSCOPY;  Service: Cardiovascular;  Laterality: N/A;   CARDIOVERSION N/A 09/02/2015   Procedure: CARDIOVERSION;  Surgeon: Laqueta Linden, MD;  Location: [facility] ENDOSCOPY;  Service: Cardiovascular;  Laterality: N/A;   CATARACT EXTRACTION, BILATERAL Bilateral 2010   JOINT REPLACEMENT     KNEE SURGERY  2002   right hip replacement     RIGHT KNEE REPLACEMENT  2011   TEE WITHOUT CARDIOVERSION N/A 06/08/2015   Procedure: TRANSESOPHAGEAL ECHOCARDIOGRAM (TEE);  Surgeon: Jake Bathe, MD;  Location: [facility] ENDOSCOPY;  Service: Cardiovascular;  Laterality: N/A;   TOTAL HIP ARTHROPLASTY Right 2011   TOTAL SHOULDER REPLACEMENT Right 2003   TOTAL SHOULDER REPLACEMENT Left 2007   WRIST SURGERY Bilateral 2008   both wrists    Allergies  Allergen Reactions   Banana Nausea Only    All banana products also   Penicillins Hives    Outpatient Encounter Medications as of 12/05/2019  Medication Sig   CARDIZEM LA 120 MG 24 hr tablet TAKE 1 TABLET (120 MG TOTAL) BY MOUTH 2 (TWO) TIMES DAILY.   docusate sodium (COLACE) 100 MG  capsule Take 1 capsule (100 mg total) by mouth daily as needed for mild constipation.   ELIQUIS 5 MG TABS tablet TAKE 1 TABLET BY MOUTH TWICE A DAY   furosemide (LASIX) 20 MG tablet TAKE 1 TABLET DAILY AS NEEDED FOR WEIGHT GAIN >3LBS.   levothyroxine (SYNTHROID) 100 MCG tablet Take 100 mcg by mouth daily before breakfast.   metoprolol succinate (TOPROL-XL) 25 MG 24 hr tablet TAKE 1 TABLET BY MOUTH TWICE A DAY    traMADol (ULTRAM) 50 MG tablet Take 1 tablet (50 mg total) by mouth 2 (two) times daily as needed for moderate pain or severe pain.   No facility-administered encounter medications on file as of 12/05/2019.    Review of Systems  Eyes: Positive for pain and visual disturbance. Negative for discharge and itching.  Respiratory: Negative for cough, chest tightness, shortness of breath and wheezing.   Cardiovascular: Negative for chest pain, palpitations and leg swelling.  Neurological: Negative for dizziness, seizures, speech difficulty, weakness and light-headedness.    Immunization History  Administered Date(s) Administered   Fluad Quad(high Dose 65+) 03/15/2019   Influenza Split 03/25/2012   Influenza, High Dose Seasonal PF 03/28/2018   Influenza,inj,Quad PF,6+ Mos 02/24/2013, 03/15/2015, 02/25/2016   Influenza-Unspecified 05/01/2014, 03/19/2017   PFIZER SARS-COV-2 Vaccination 06/03/2019, 06/22/2019   Pneumococcal Conjugate-13 05/27/2014   Pneumococcal Polysaccharide-23 02/25/2016   Tdap 10/10/2016   Zoster 05/22/2001   Zoster Recombinat (Shingrix) 08/31/2017, 11/07/2017   Pertinent  Health Maintenance Due  Topic Date Due   INFLUENZA VACCINE  12/21/2019   DEXA SCAN  Completed   PNA vac Low Risk Adult  Completed   Fall Risk  11/27/2019 11/12/2019 11/05/2019 07/09/2019 05/26/2019  Falls in the past year? 0 0 0 1 1  Number falls in past yr: - 0 0 0 0  Injury with Fall? 0 0 0 0 1  Comment - - - - -    Vitals:   12/05/19 1455  BP: 116/70  Pulse: 73  Temp: (!) 96.2 F (35.7 C)  TempSrc: Temporal  SpO2: 96%  Weight: 143 lb (64.9 kg)  Height: 5\' 3"  (1.6 m)   Body mass index is 25.33 kg/m. Physical Exam Vitals reviewed.  Constitutional:      General: She is not in acute distress.    Appearance: She is not ill-appearing.  HENT:     Head: Normocephalic.  Eyes:     General: Vision grossly intact.        Left eye: No discharge or hordeolum.     Extraocular  Movements:     Left eye: Normal extraocular motion and no nystagmus.     Conjunctiva/sclera:     Right eye: Right conjunctiva is not injected. No exudate or hemorrhage.    Left eye: Left conjunctiva is injected. No exudate.    Comments: Unclear dark red skin tissue noted on 7-5 O'clock position.eyelid non-tender to palpation.left eyelid swelling noted.   Cardiovascular:     Rate and Rhythm: Normal rate and regular rhythm.     Pulses: Normal pulses.     Heart sounds: Normal heart sounds. No murmur heard.  No friction rub. No gallop.   Pulmonary:     Effort: Pulmonary effort is normal. No respiratory distress.     Breath sounds: Normal breath sounds. No wheezing, rhonchi or rales.  Chest:     Chest wall: No tenderness.  Skin:    General: Skin is warm.     Coloration: Skin is not pale.  Findings: No bruising or erythema.  Neurological:     Mental Status: She is alert and oriented to person, place, and time.     Cranial Nerves: No cranial nerve deficit.     Motor: No weakness.     Gait: Gait abnormal.  Psychiatric:        Mood and Affect: Mood normal.        Behavior: Behavior normal.        Thought Content: Thought content normal.        Judgment: Judgment normal.     Labs reviewed: Recent Labs    07/09/19 1438  NA 141  K 4.6  CL 104  CO2 28  GLUCOSE 93  BUN 19  CREATININE 0.79  CALCIUM 10.3   Recent Labs    07/09/19 1438  AST 14  ALT 8  BILITOT 0.5  PROT 6.5   Recent Labs    01/03/19 1127 07/09/19 1438  WBC 8.6 6.7  NEUTROABS 6,502 4,201  HGB 12.7 13.7  HCT 38.5 41.2  MCV 89.7 89.6  PLT 467* 308   Lab Results  Component Value Date   TSH 0.33 (L) 11/12/2019   No results found for: HGBA1C Lab Results  Component Value Date   CHOL 193 05/27/2014   HDL 66 05/27/2014   LDLCALC 106 (H) 05/27/2014   TRIG 107 05/27/2014   CHOLHDL 2.9 05/27/2014    Significant Diagnostic Results in last 30 days:  No results found.  Assessment/Plan   Redness  of left eye Left eye red blood shot Unclear dark red skin tissue noted on 7-5 O'clock position.eyelid non-tender to palpation.suspicious for injury to sclera.No drainage noted. Recommended further evaluation at the ED given that it's Friday afternoon and going into the weekend and Ophthalmologist office closed today. Daughter and patient verbalized understanding stated will go to [facility] ER since they are close by.   Family/ staff Communication: Reviewed plan of care with patient and daughter.  Labs/tests ordered: None   Next Appointment: send to ED   Caesar Bookman, NP

## 2019-12-05 NOTE — ED Triage Notes (Signed)
Pt arrives to ED w/ c/o blood in R eye that started this morning. Pt states that her eye was itching her today so she had been rubbing it, but then noticed today that it had become red. Pt denies vision changes.

## 2019-12-05 NOTE — Patient Instructions (Signed)
Please get left eye evaluated in the ED

## 2019-12-05 NOTE — ED Provider Notes (Signed)
[facility] EMERGENCY DEPARTMENT Provider Note   CSN: [id number] Arrival date & time: 12/05/19  1537   History Chief Complaint  Patient presents with  . Eye Problem    Katrina Velez is a 86 y.o. female.  The history is provided by the patient.  Eye Problem She has history of atrial fibrillation anticoagulated on apixaban and comes in because of redness in her left eye.  She noted some pain and itching in her left eye this morning, and noticed that it was red when she looked in the mirror.  She was seen by nurse practitioner was concerned that there might be a laceration and directed her to come to the ED.  The eye is no longer painful or itchy.  Vision is normal.  She denies any trauma.  Past Medical History:  Diagnosis Date  . A-fib (HCC) 05/2015   HOSPITALIZED   . Arthritis   . Cataract   . Hypothyroidism   . Permanent atrial fibrillation (HCC)   . Thyroid disease     Patient Active Problem List   Diagnosis Date Noted  . Spinal stenosis of lumbar region with neurogenic claudication 05/11/2017  . Seasonal allergic rhinitis due to pollen 10/04/2016  . High risk medication use 10/04/2016  . Osteoarthritis 03/01/2016  . Iatrogenic hyperthyroidism 03/01/2016  . Atrial fibrillation (HCC) 06/05/2015  . Nonspecific abnormal electrocardiogram (ECG) (EKG) 05/27/2014  . Elevated glucose 03/26/2012  . Hypothyroidism post removal of goiter 01/31/2012  . Nicotine addiction 01/31/2012  . Inflammatory arthritis 01/31/2012    Past Surgical History:  Procedure Laterality Date  . CARDIOVERSION N/A 06/08/2015   Procedure: CARDIOVERSION;  Surgeon: Jake Bathe, MD;  Location: [facility] ENDOSCOPY;  Service: Cardiovascular;  Laterality: N/A;  . CARDIOVERSION N/A 09/02/2015   Procedure: CARDIOVERSION;  Surgeon: Laqueta Linden, MD;  Location: [facility] ENDOSCOPY;  Service: Cardiovascular;  Laterality: N/A;  . CATARACT EXTRACTION, BILATERAL Bilateral 2010  . JOINT REPLACEMENT    .  KNEE SURGERY  2002  . right hip replacement    . RIGHT KNEE REPLACEMENT  2011  . TEE WITHOUT CARDIOVERSION N/A 06/08/2015   Procedure: TRANSESOPHAGEAL ECHOCARDIOGRAM (TEE);  Surgeon: Jake Bathe, MD;  Location: [facility] ENDOSCOPY;  Service: Cardiovascular;  Laterality: N/A;  . TOTAL HIP ARTHROPLASTY Right 2011  . TOTAL SHOULDER REPLACEMENT Right 2003  . TOTAL SHOULDER REPLACEMENT Left 2007  . WRIST SURGERY Bilateral 2008   both wrists     OB History   No obstetric history on file.     Family History  Problem Relation Age of Onset  . Hypercalcemia Daughter   . Hypertension Daughter     Social History   Tobacco Use  . Smoking status: Former Smoker    Packs/day: 1.00    Years: 65.00    Pack years: 65.00    Types: Cigarettes    Quit date: 05/28/2011    Years since quitting: 8.5  . Smokeless tobacco: Never Used  Vaping Use  . Vaping Use: Former  Substance Use Topics  . Alcohol use: Yes    Alcohol/week: 1.0 - 2.0 standard drink    Types: 1 - 2 Glasses of wine per week    Comment: occasional drink  . Drug use: No    Home Medications Prior to Admission medications   Medication Sig Start Date End Date Taking? Authorizing Provider  CARDIZEM LA 120 MG 24 hr tablet TAKE 1 TABLET (120 MG TOTAL) BY MOUTH 2 (TWO) TIMES DAILY. 09/22/19   Noralyn Pick,  Elvina Sidle, NP  docusate sodium (COLACE) 100 MG capsule Take 1 capsule (100 mg total) by mouth daily as needed for mild constipation. 11/12/19   Ngetich, Donalee Citrin, NP  ELIQUIS 5 MG TABS tablet TAKE 1 TABLET BY MOUTH TWICE A DAY 07/07/19   Newman Nip, NP  furosemide (LASIX) 20 MG tablet TAKE 1 TABLET DAILY AS NEEDED FOR WEIGHT GAIN >3LBS. 11/07/18   Newman Nip, NP  levothyroxine (SYNTHROID) 100 MCG tablet Take 100 mcg by mouth daily before breakfast.    [provider]  metoprolol succinate (TOPROL-XL) 25 MG 24 hr tablet TAKE 1 TABLET BY MOUTH TWICE A DAY 04/24/19   Newman Nip, NP  traMADol (ULTRAM) 50 MG tablet Take 1 tablet  (50 mg total) by mouth 2 (two) times daily as needed for moderate pain or severe pain. 11/27/19   Reed, Tiffany L, DO    Allergies    Banana and Penicillins  Review of Systems   Review of Systems  All other systems reviewed and are negative.   Physical Exam Updated Vital Signs BP 115/62   Pulse 67   Temp 98.6 F (37 C)   Resp 15   Ht 5\' 3"  (1.6 m)   Wt 64.9 kg   SpO2 98%   BMI 25.33 kg/m   Physical Exam Vitals and nursing note reviewed.   86 year old female, resting comfortably and in no acute distress. Vital signs are normal. Oxygen saturation is 98%, which is normal. Head is normocephalic and atraumatic. PERRLA, EOMI. Oropharynx is clear.  There is a subconjunctival hemorrhage of the left eye which is most prominent inferiorly and temporally but involves most of the conjunctiva.  No evidence of any laceration.  Anterior chamber is clear, no hyphema. Neck is nontender and supple without adenopathy or JVD. Back is nontender and there is no CVA tenderness. Lungs are clear without rales, wheezes, or rhonchi. Chest is nontender. Heart has regular rate and rhythm without murmur. Abdomen is soft, flat, nontender without masses or hepatosplenomegaly and peristalsis is normoactive. Extremities have no cyanosis or edema, full range of motion is present. Skin is warm and dry without rash. Neurologic: Mental status is normal, cranial nerves are intact, there are no motor or sensory deficits.  ED Results / Procedures / Treatments    Procedures Procedures  Medications Ordered in ED Medications - No data to display  ED Course  I have reviewed the triage vital signs and the nursing notes.  MDM Rules/Calculators/A&P Subconjunctival hemorrhage of the left eye, will made worse by anticoagulated state.  No evidence of any actual significant eye injury.  Old records reviewed confirming outpatient management of atrial fibrillation, evaluation by nurse practitioner prior to coming to the  ED.  Patient was reassured of the benign nature of the condition.  Final Clinical Impression(s) / ED Diagnoses Final diagnoses:  Subconjunctival hemorrhage, left  Chronic anticoagulation    Rx / DC Orders ED Discharge Orders    None       99, MD 12/05/19 2319

## 2020-01-07 ENCOUNTER — Ambulatory Visit: Payer: Medicare Other | Admitting: Nurse Practitioner

## 2020-01-27 IMAGING — CT CT HEAD WITHOUT CONTRAST
5 of 14 series · 16 of 47 positions shown, 17 images · non-contrast
Comparison: None.

CLINICAL DATA: Tripped and fell forward striking her forehead and
the left side of her face. Denies loss of consciousness.

EXAM:
CT HEAD WITHOUT CONTRAST
CT MAXILLOFACIAL WITHOUT CONTRAST
CT CERVICAL SPINE WITHOUT CONTRAST
TECHNIQUE: Multidetector CT imaging of the head, cervical spine, and
maxillofacial structures were performed using the standard protocol
without intravenous contrast. Multiplanar CT image reconstructions
of the cervical spine and maxillofacial structures were also
generated.

[Series 4: maxilllofacial 2.0 hr40 3 · axial · 0.38mm/px · z∈[-217,-109]mm · 4 of 90 slices shown, 5 images]
[im 18/90  brain]
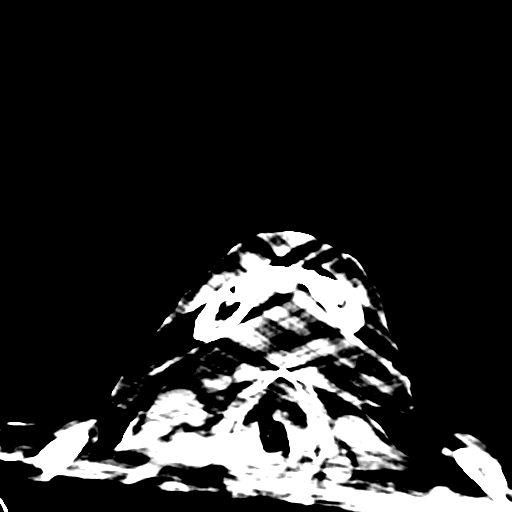
[im 18/90  bone]
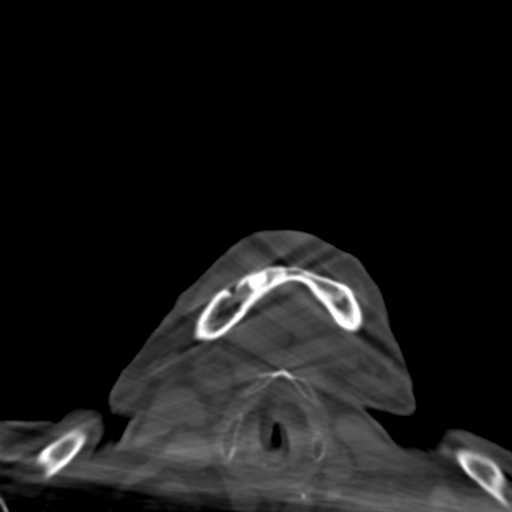
[im 36/90  brain]
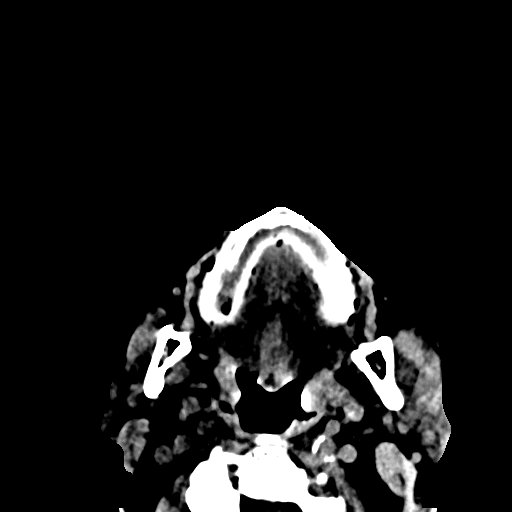
[im 54/90  brain]
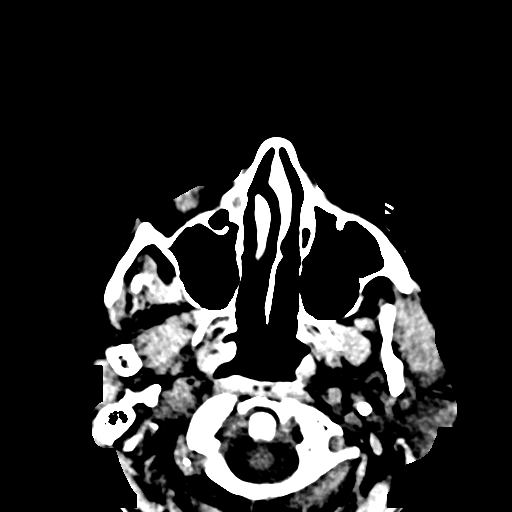
[im 72/90  brain]
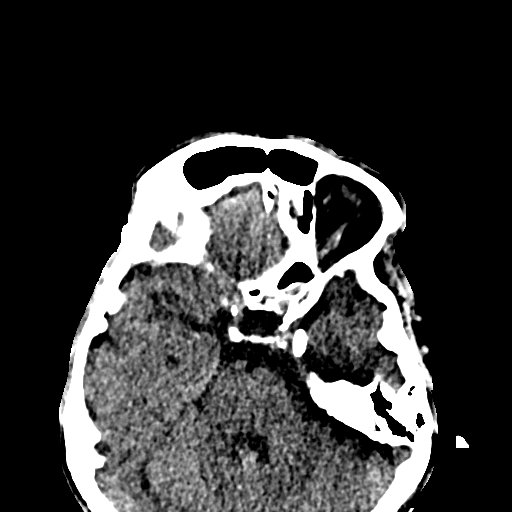

[Series 7: head bone · axial · 0.41mm/px · z∈[-107,-31]mm · 3 of 78 slices shown]
[im 20/78  bone]
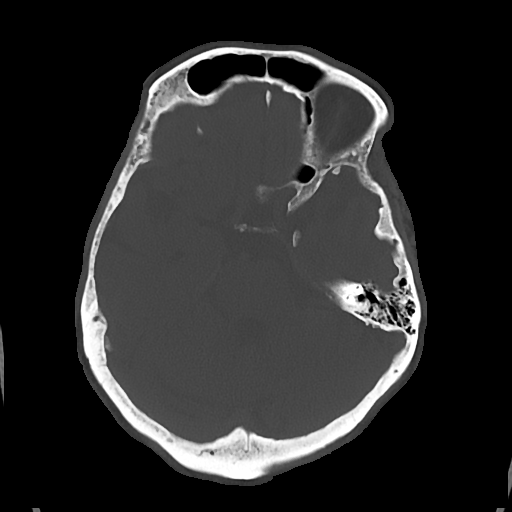
[im 39/78  bone]
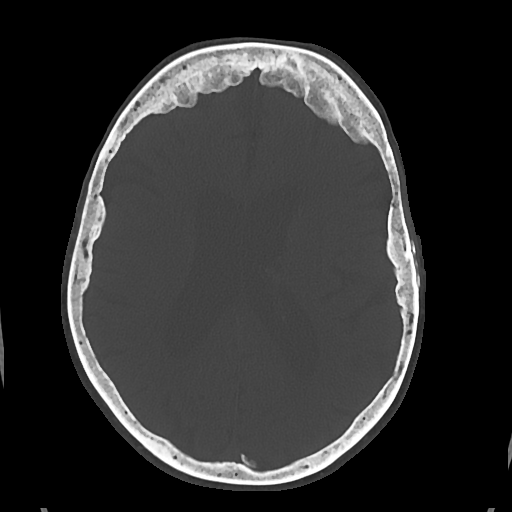
[im 58/78  bone]
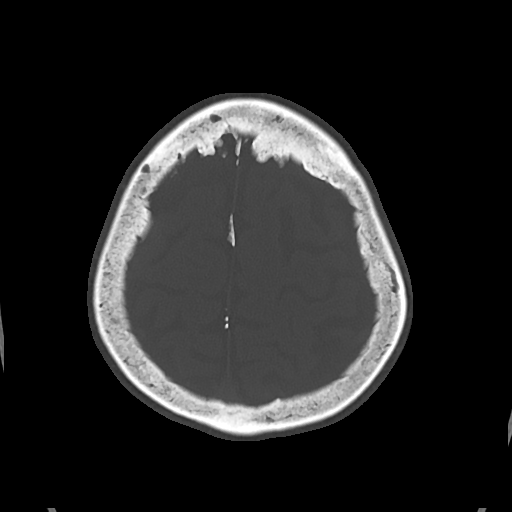

[Series 11: maxilllofacial 2.0 hr59 3 · axial · 0.38mm/px · z∈[-217,-109]mm · 4 of 90 slices shown]
[im 18/90  brain]
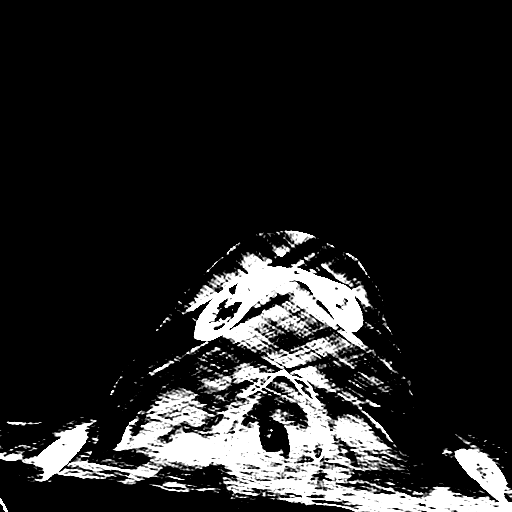
[im 36/90  brain]
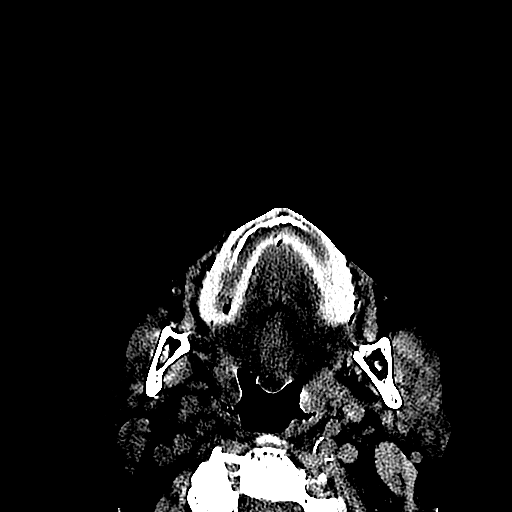
[im 54/90  brain]
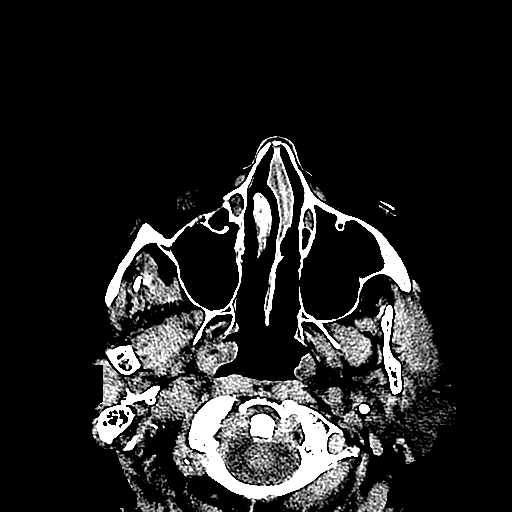
[im 72/90  brain]
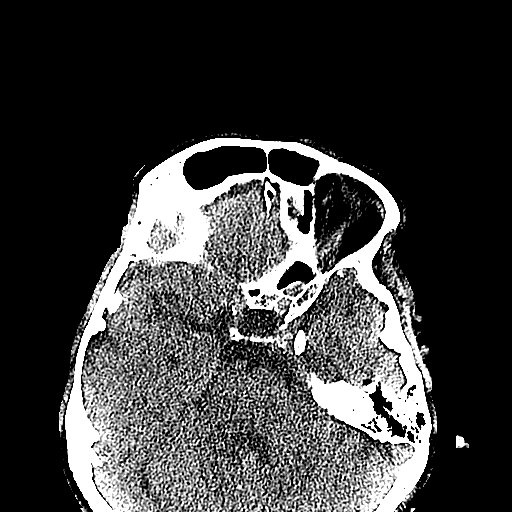

[Series 13: st cor · coronal · 0.41mm/px · 1 of 79 slices shown]
[im 40/79  brain]
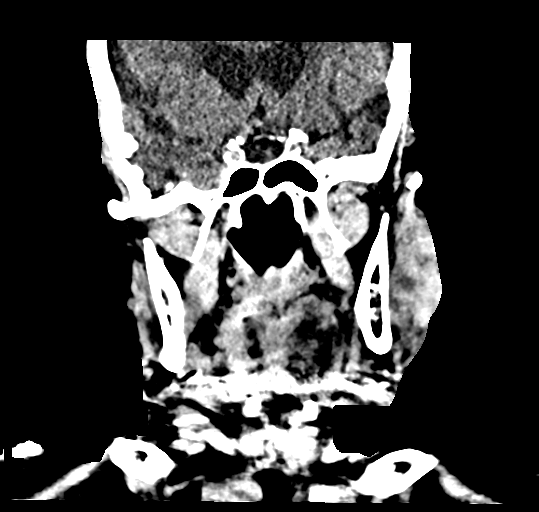

[Series 20: orthogonal axials · axial · 0.21mm/px · z∈[-249,-158]mm · 4 of 88 slices shown]
[im 18/88  brain]
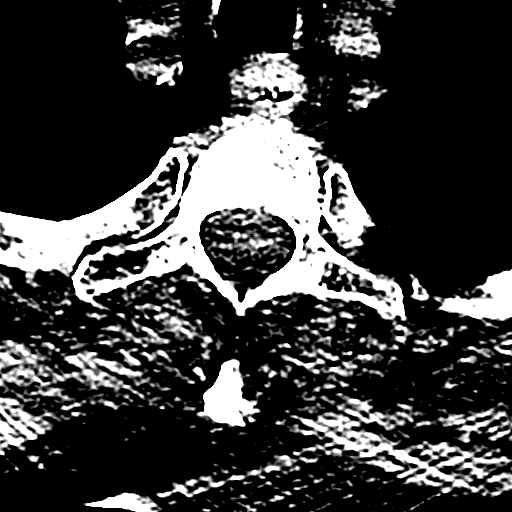
[im 35/88  brain]
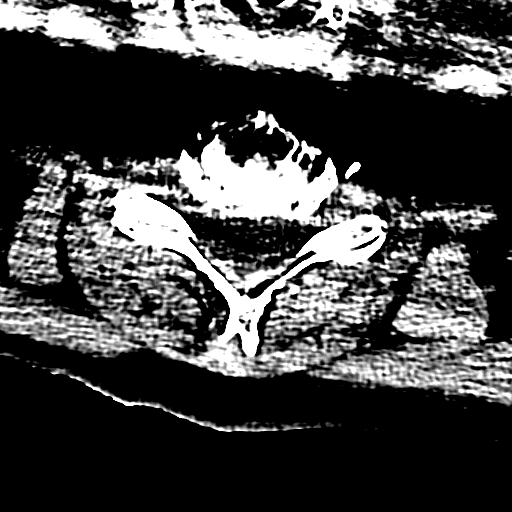
[im 53/88  brain]
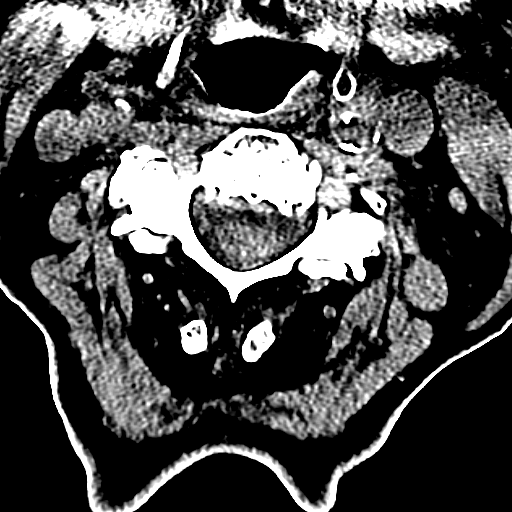
[im 70/88  brain]
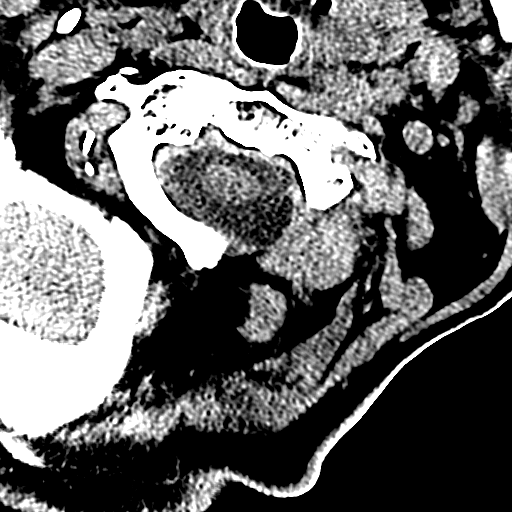

[16 of 47 positions shown; findings below may reference images not displayed]

FINDINGS: CT HEAD FINDINGS

Brain: No evidence of acute infarction, hemorrhage, hydrocephalus,
extra-axial collection or mass lesion/mass effect.

There is ventricular and sulcal enlargement reflecting
age-appropriate volume loss. Mild periventricular white matter
hypoattenuation is present consistent with chronic microvascular
ischemic change.

Vascular: No hyperdense vessel or unexpected calcification.

Skull: Normal. Negative for fracture or focal lesion.

Other: Small left frontal scalp contusion.

CT MAXILLOFACIAL FINDINGS

Osseous: No fracture or mandibular dislocation. No destructive
process.

Orbits: Negative. No traumatic or inflammatory finding.

Sinuses: Clear.  Clear mastoid air cells and middle ear cavities.

Soft tissues: No soft tissue mass or hematoma. No notable contusion.

CT CERVICAL SPINE FINDINGS

Alignment: Reversal the normal cervical lordosis, apex at C6. Mild
degenerative anterolisthesis of C5 on C6 and minimally of C3 and C4
and C4 on C5. No traumatic subluxation.

Skull base and vertebrae: No acute fracture. No primary bone lesion
or focal pathologic process.

Soft tissues and spinal canal: No prevertebral fluid or swelling. No
visible canal hematoma.

Disc levels: Moderate loss of disc height, at all levels, least
prominently involving C4-C5, most severe at C5-C6. Endplate spurring
and mild spondylotic disc bulging at all levels except C4-C5.
Bilateral facet degenerative changes are noted. No convincing disc
herniation.

Upper chest: No acute finding.  Clear lung apices.

Other: None.
IMPRESSION: HEAD CT

1. No acute intracranial abnormalities.  No skull fracture.
2. Atrophy and mild chronic microvascular ischemic change.

MAXILLOFACIAL CT

1. No fracture or acute finding.

CERVICAL CT

1. No fracture or acute finding.

## 2020-01-27 IMAGING — DX LEFT HAND - COMPLETE 3+ VIEW
3 series · 3 of 3 positions shown · non-contrast
Comparison: None.

CLINICAL DATA: Fall.  Left fifth finger swollen and black and blue.

EXAM:
LEFT HAND - COMPLETE 3+ VIEW

[hand pa]
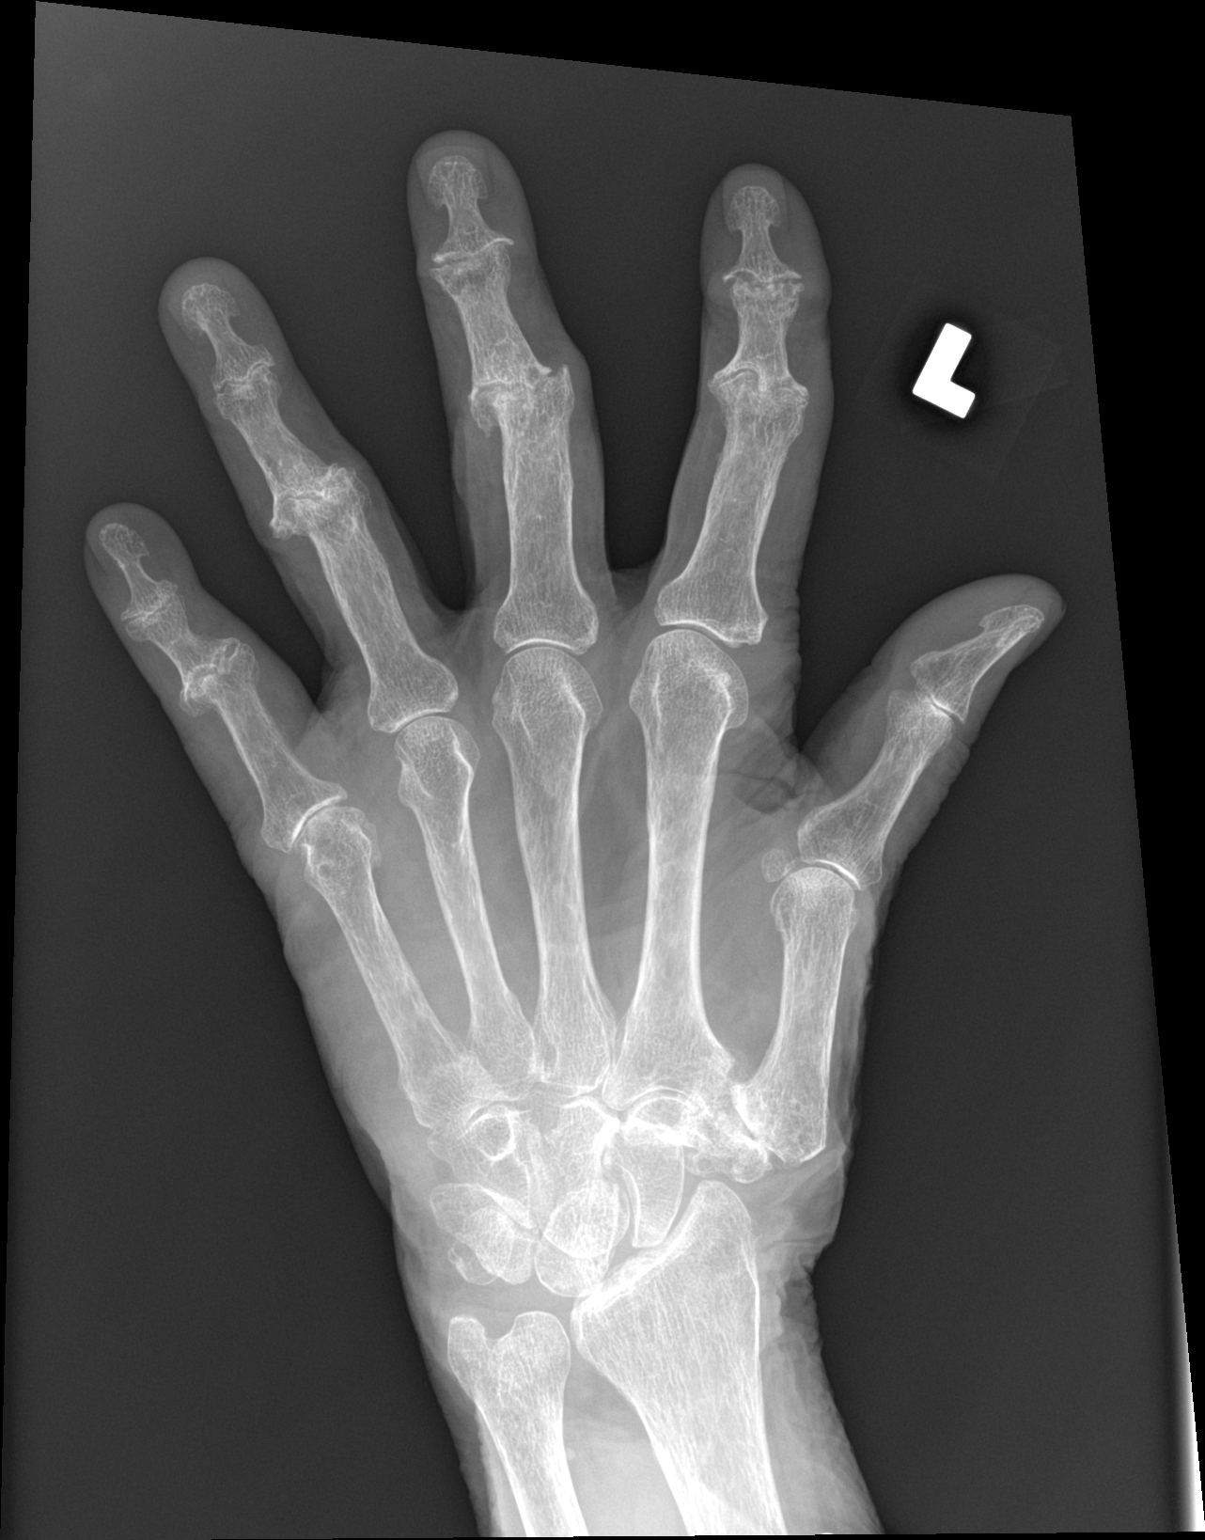

[hand obl]
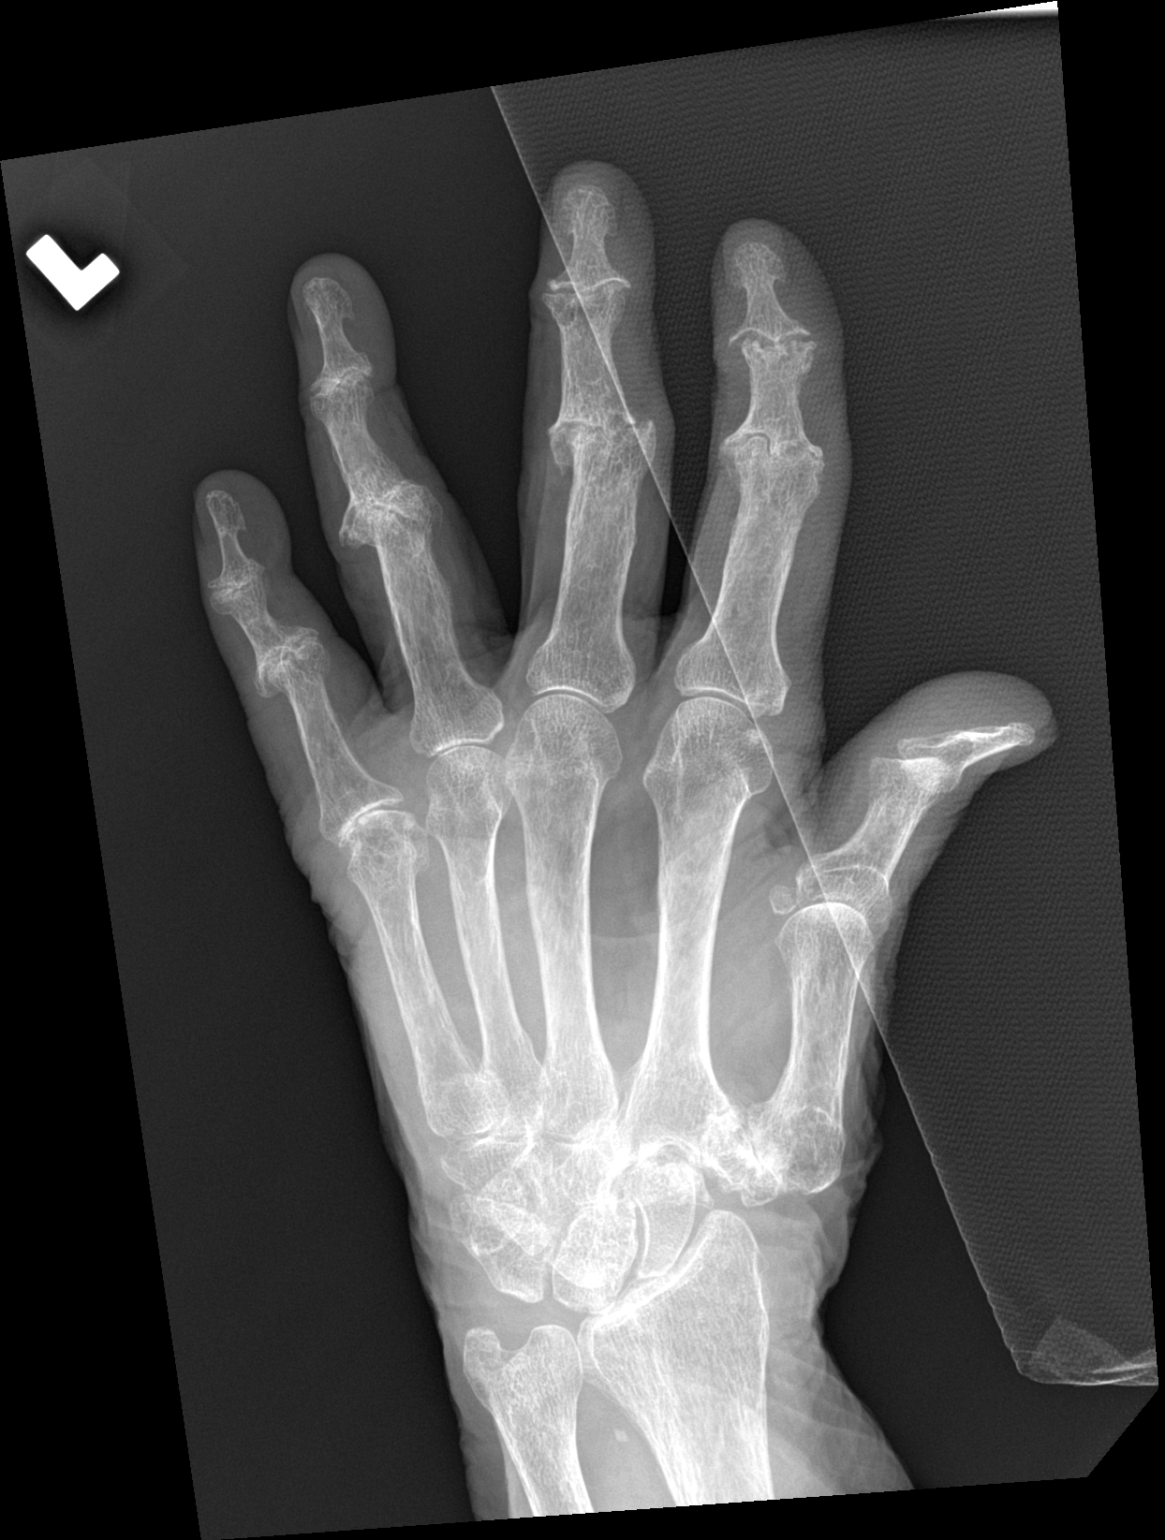

[hand lat]
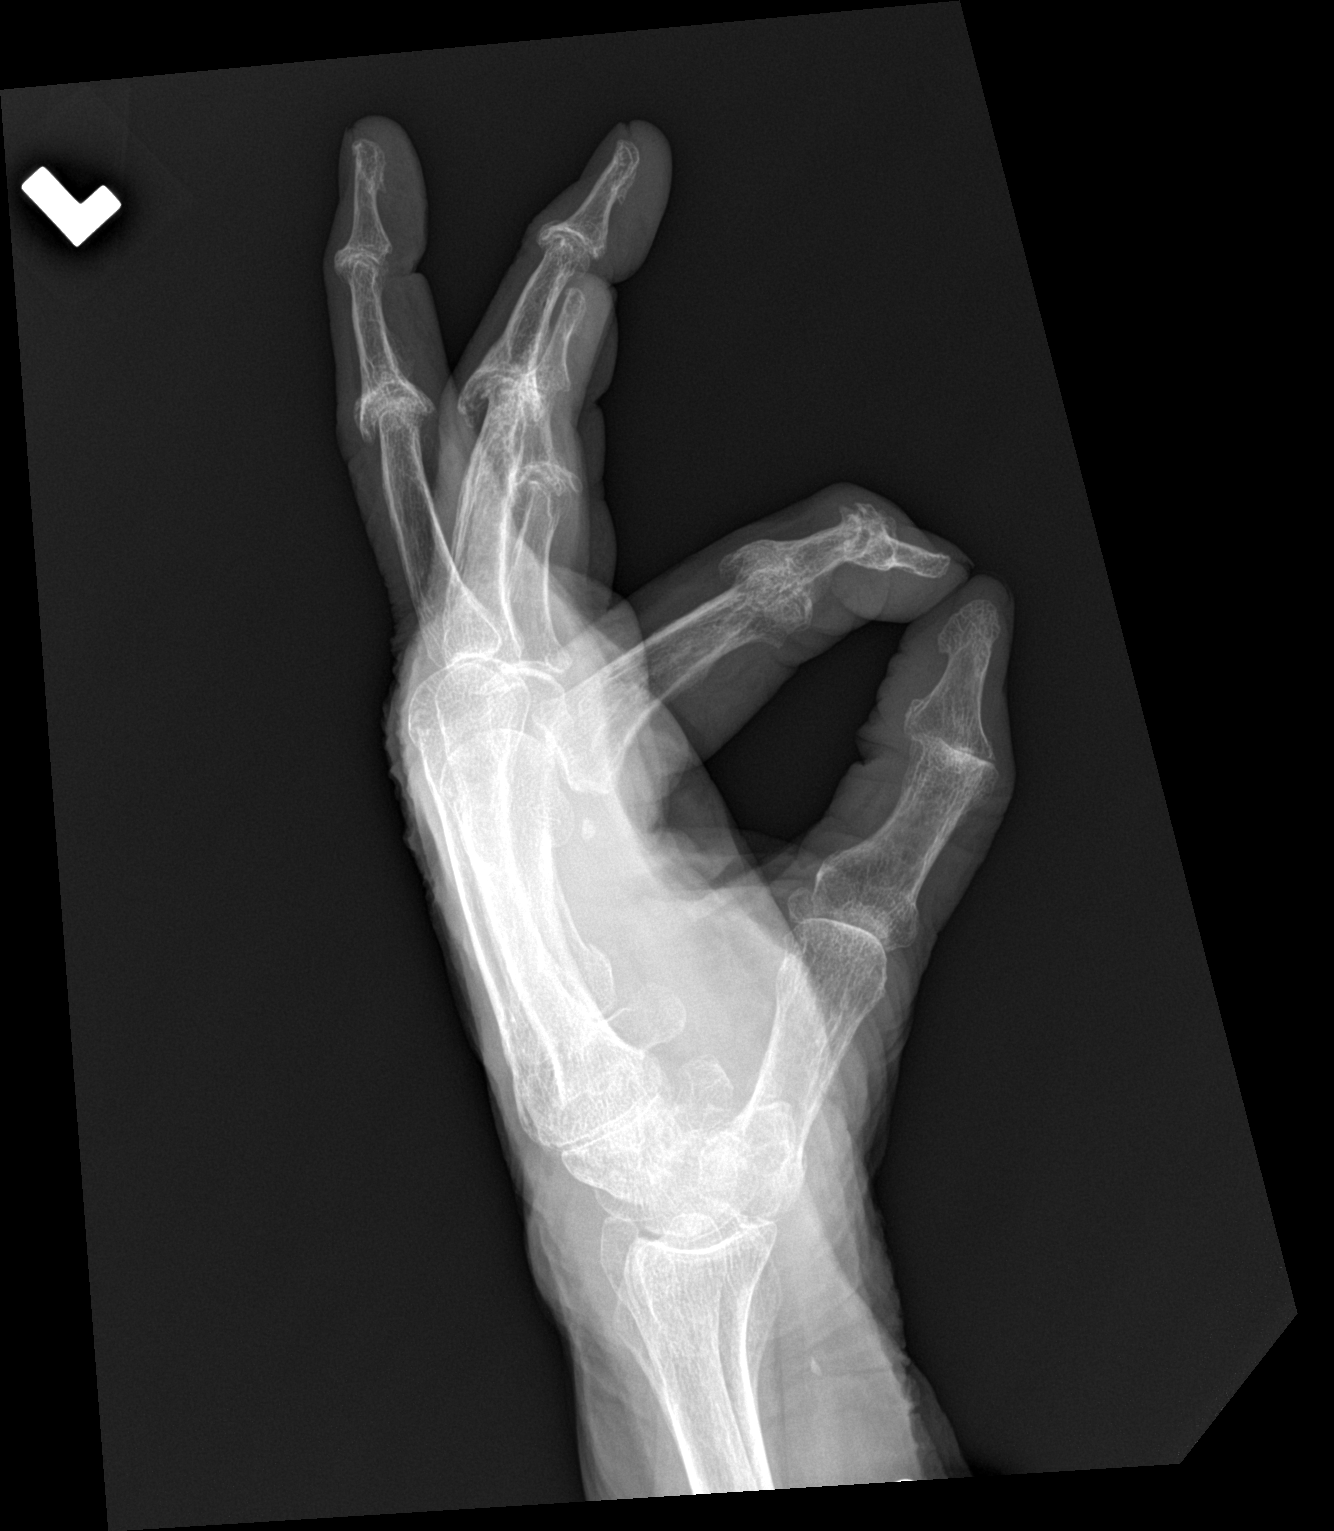

[3 of 3 positions shown; findings below may reference images not displayed]

FINDINGS: Subtle nondisplaced fracture across the radial margin of the distal
aspect of the fifth finger proximal phalanx.

No other convincing fracture.

There are advanced arthropathic changes. Marked joint space
narrowing with small marginal osteophytes and subchondral cystic
change and no erosions are noted involving the DIP and PIP joints of
all fingers, mildly involving the IP joint of the thumb. There is
milder asymmetric joint space narrowing of the PIP joints most
evident of the fifth PIP joint. Joint space narrowing with mild
subchondral sclerosis is noted at the scaphoid, trapezium trapezoid
articulation and the trapezium first metacarpal articulation.

Bones are diffusely demineralized.

There is fifth finger soft tissue swelling.
IMPRESSION: 1. Subtle nondisplaced fracture of the distal aspect of the proximal
phalanx of the fifth finger, across the radial corner.
2. No other fractures.
3. Advanced arthropathic changes consistent with erosive
osteoarthritis.

## 2020-02-06 ENCOUNTER — Other Ambulatory Visit (HOSPITAL_COMMUNITY): Payer: Self-pay | Admitting: Nurse Practitioner

## 2020-02-08 ENCOUNTER — Other Ambulatory Visit (HOSPITAL_COMMUNITY): Payer: Self-pay | Admitting: Nurse Practitioner

## 2020-02-08 DIAGNOSIS — I4821 Permanent atrial fibrillation: Secondary | ICD-10-CM

## 2020-02-15 DIAGNOSIS — Z23 Encounter for immunization: Secondary | ICD-10-CM | POA: Diagnosis not present

## 2020-03-16 DIAGNOSIS — Z23 Encounter for immunization: Secondary | ICD-10-CM | POA: Diagnosis not present

## 2020-03-22 ENCOUNTER — Ambulatory Visit (HOSPITAL_COMMUNITY)
Admission: RE | Admit: 2020-03-22 | Discharge: 2020-03-22 | Disposition: A | Discharge status: Home / Self Care | Payer: Medicare Other | Source: Ambulatory Visit | Attending: Nurse Practitioner | Admitting: Nurse Practitioner

## 2020-03-22 ENCOUNTER — Encounter (HOSPITAL_COMMUNITY): Payer: Self-pay | Admitting: Nurse Practitioner

## 2020-03-22 ENCOUNTER — Other Ambulatory Visit: Payer: Self-pay

## 2020-03-22 VITALS — BP 110/76 | HR 105 | Ht 63.0 in | Wt 141.2 lb

## 2020-03-22 DIAGNOSIS — Z87891 Personal history of nicotine dependence: Secondary | ICD-10-CM | POA: Insufficient documentation

## 2020-03-22 DIAGNOSIS — I4821 Permanent atrial fibrillation: Secondary | ICD-10-CM | POA: Diagnosis not present

## 2020-03-22 DIAGNOSIS — Z880 Allergy status to penicillin: Secondary | ICD-10-CM | POA: Insufficient documentation

## 2020-03-22 DIAGNOSIS — Z79890 Hormone replacement therapy (postmenopausal): Secondary | ICD-10-CM | POA: Insufficient documentation

## 2020-03-22 DIAGNOSIS — E039 Hypothyroidism, unspecified: Secondary | ICD-10-CM | POA: Insufficient documentation

## 2020-03-22 DIAGNOSIS — Z7901 Long term (current) use of anticoagulants: Secondary | ICD-10-CM | POA: Insufficient documentation

## 2020-03-22 DIAGNOSIS — Z79899 Other long term (current) drug therapy: Secondary | ICD-10-CM | POA: Insufficient documentation

## 2020-03-22 DIAGNOSIS — D6869 Other thrombophilia: Secondary | ICD-10-CM

## 2020-03-22 LAB — CBC
HCT: 43.1 % (ref 36.0–46.0)
Hemoglobin: 13.5 g/dL (ref 12.0–15.0)
MCH: 29.5 pg (ref 26.0–34.0)
MCHC: 31.3 g/dL (ref 30.0–36.0)
MCV: 94.3 fL (ref 80.0–100.0)
Platelets: 315 10*3/uL (ref 150–400)
RBC: 4.57 MIL/uL (ref 3.87–5.11)
RDW: 13.5 % (ref 11.5–15.5)
WBC: 7 10*3/uL (ref 4.0–10.5)
nRBC: 0 % (ref 0.0–0.2)

## 2020-03-22 LAB — BASIC METABOLIC PANEL
Anion gap: 8 (ref 5–15)
BUN: 11 mg/dL (ref 8–23)
CO2: 30 mmol/L (ref 22–32)
Calcium: 9.9 mg/dL (ref 8.9–10.3)
Chloride: 101 mmol/L (ref 98–111)
Creatinine, Ser: 0.85 mg/dL (ref 0.44–1.00)
GFR, Estimated: >60 mL/min (ref >60)
Glucose, Bld: 109 mg/dL — ABNORMAL HIGH (ref 70–99)
Potassium: 4.4 mmol/L (ref 3.5–5.1)
Sodium: 139 mmol/L (ref 135–145)

## 2020-03-22 LAB — TSH: TSH: 0.453 u[IU]/mL (ref 0.350–4.500)

## 2020-03-22 NOTE — Progress Notes (Signed)
Patient ID: Katrina Velez, female   DOB: Dec 19, 1925, 86 y.o.   MRN: [id number]       Primary Care Physician: Sharon Seller, NP Referring Physician: Dr. Laqueta Jean Maeda is a 86 y.o. female with a h/o afib that was loaded on amiodarone and successfully cardioverted 4/13, but with ERAF. She did not see that much difference in SR and had some thyroid concerns  already on replacement, so decision was made to stop amiodarone and aim for rate control,HR has been staying below 100 at home. Her energy is good for her age and she is back to hanging clothes outside. No issues with anticoagulant.   Returns to afib clinic 8/23, after she has noticed at home some fluctuation in heart rate. Her daughter on taking her heart rate and Bp one day, noticed that hr heart rate was 48 bpm, although the pt did not feel bad. She talked to the on call provider and was advised to skip the next rate control drug. A few days later, the pt had a nonsustained episode of rvr at 150 bpm. She did feel lightheaded with that episode. On call provider was contacted and she was told to how to take extra rate control if needed. However after a few more minutes, her HR slowed down.  EKG showed afib with controlled afib. She is also upset because she feels that she needs more thyroid replacement. She has also felt better on a higher dose than what the labs suggest that she takes. Her synthroid was reduced several months back and the pt is c/o thinning hair, thinning nails, more fatigue, depression. She is planning to change to a new MD in the first of October to see if she can get straightened out with her thyroid.   Returns 9/6, holter monitor placed on previous visit and reviewed. Shows average HR around 107 bpm, high rate 160 bpm, minimum 40 bpm at 4:30 am. Pt was not aware of fast or slow heart rate.She could benefit from more rate control. She is still able to do her house work with minimal symptoms and feels that she has good  rate control.   F/u in the afib clinic 10/25 and has good rate control. Currently is on metoprolol ER 25 mg bid and cardizem 120 mg bid. Tried higher doses of BB but could not tolerate. She is tolerating this combination of rate control with HR in the 80's.   F/u in afib clinic,4/25, she is feeling well. Does not feel afib . No issues with shortness of breath or pedal edema. No bleeding issues with eliquis.  F/u in fib clinic, 09/20/17. She has now turned 89 and is doing well. She did lacerate her leg on a patio chair about 6 weeks  Ago and did need stitches. It is healing but a large dark scab is still present. Otherwise, no complaints.  F/u in afib clinic, 03/28/18. She is doing very well. No complaints. It took the area on her leg described above, 6 months to heal and eventually this was done with the help of the would clinic. She is now 89 and going strong. Her renal panel for June showed a creatinine of 0.84 and with her current weight, she still qualifies for 5 mg eliquis bid. No bleeding issues.  F/u in afib clinic, 03/25/19. She continues in rate controlled afib.She did have a fall at home  and went to the ER with bruising and a couple of stitches.No major trauma. She  is now walking with a cane in the house and has a fall alert bracelet now. Does not know why she fell, just  got off balance.    F/u 09/23/19 in afib clinic. She feels well. Relieved to have has her covid shots so she can be more active. She and her daughter went to the beach in April and she really enjoyed the change in scenery. Her afib is rate controlled and she does not feel it. She  had a major fall last fall, mostly bruising, but no further reports of fall. Walks with a cane.     F/u in afib clinic, 03/22/20. Overall no  issues with afib, she is rate controlled   at home in the 80's/90's. Her biggest concern is that she  feels she needs at least 125 mcg levothyroxine to feel her best. This  has been her dose for years and when she  tries to reduce it, she feels terrible. She states that Dr. Neva Seat  before he retired would listen to her and her reason for the higher dose, not go strictly by lab values,  but her last few  physician's want to go strictly by the TSH. I explained to her that the lab  values are what is used to adjust meds but she feels it should be how she feels. She was recommended by Dr. Renato Gails to reduce to 112 mcg daily but she is taking an higher dose than  this, having older med doses  around the house, I believe she indicated she was taking 125 mcg daily,   Today, she denies symptoms of palpitations, chest pain, shortness of breath, orthopnea, PND, lower extremity edema, dizziness, presyncope, syncope, or neurologic sequela. The patient is tolerating medications without difficulties and is otherwise without complaint today.   Past Medical History:  Diagnosis Date  . A-fib (HCC) 05/2015   HOSPITALIZED   . Arthritis   . Cataract   . Hypothyroidism   . Permanent atrial fibrillation (HCC)   . Thyroid disease    Past Surgical History:  Procedure Laterality Date  . CARDIOVERSION N/A 06/08/2015   Procedure: CARDIOVERSION;  Surgeon: Jake Bathe, MD;  Location: [facility] ENDOSCOPY;  Service: Cardiovascular;  Laterality: N/A;  . CARDIOVERSION N/A 09/02/2015   Procedure: CARDIOVERSION;  Surgeon: Laqueta Linden, MD;  Location: [facility] ENDOSCOPY;  Service: Cardiovascular;  Laterality: N/A;  . CATARACT EXTRACTION, BILATERAL Bilateral 2010  . JOINT REPLACEMENT    . KNEE SURGERY  2002  . right hip replacement    . RIGHT KNEE REPLACEMENT  2011  . TEE WITHOUT CARDIOVERSION N/A 06/08/2015   Procedure: TRANSESOPHAGEAL ECHOCARDIOGRAM (TEE);  Surgeon: Jake Bathe, MD;  Location: [facility] ENDOSCOPY;  Service: Cardiovascular;  Laterality: N/A;  . TOTAL HIP ARTHROPLASTY Right 2011  . TOTAL SHOULDER REPLACEMENT Right 2003  . TOTAL SHOULDER REPLACEMENT Left 2007  . WRIST SURGERY Bilateral 2008   both wrists    Current Outpatient  Medications  Medication Sig Dispense Refill  . CARDIZEM LA 120 MG 24 hr tablet TAKE 1 TABLET (120 MG TOTAL) BY MOUTH 2 (TWO) TIMES DAILY. 180 tablet 3  . docusate sodium (COLACE) 100 MG capsule Take 1 capsule (100 mg total) by mouth daily as needed for mild constipation. 10 capsule 0  . ELIQUIS 5 MG TABS tablet TAKE 1 TABLET BY MOUTH TWICE A DAY 180 tablet 1  . furosemide (LASIX) 20 MG tablet TAKE 1 TABLET DAILY AS NEEDED FOR WEIGHT GAIN >3LBS. 30 tablet 2  .  levothyroxine (SYNTHROID) 100 MCG tablet Take 100 mcg by mouth daily before breakfast.    . metoprolol succinate (TOPROL-XL) 25 MG 24 hr tablet TAKE 1 TABLET BY MOUTH TWICE A DAY 180 tablet 2  . traMADol (ULTRAM) 50 MG tablet Take 1 tablet (50 mg total) by mouth 2 (two) times daily as needed for moderate pain or severe pain. 60 tablet 0   No current facility-administered medications for this encounter.    Allergies  Allergen Reactions  . Banana Nausea Only    All banana products also  . Penicillins Hives    Social History   Socioeconomic History  . Marital status: Widowed    Spouse name: Not on file  . Number of children: Not on file  . Years of education: Not on file  . Highest education level: Not on file  Occupational History  . Not on file  Tobacco Use  . Smoking status: Former Smoker    Packs/day: 1.00    Years: 65.00    Pack years: 65.00    Types: Cigarettes    Quit date: 05/28/2011    Years since quitting: 8.8  . Smokeless tobacco: Never Used  Vaping Use  . Vaping Use: Former  Substance and Sexual Activity  . Alcohol use: Yes    Alcohol/week: 1.0 - 2.0 standard drink    Types: 1 - 2 Glasses of wine per week    Comment: occasional drink  . Drug use: No  . Sexual activity: Not on file  Other Topics Concern  . Not on file  Social History Narrative   DIET: NONE      DO YOU DRINK/EAT THINGS WITH CAFFEINE: YES, COFFEE AND TEA       MARITAL STATUS: WIDOWED      WHAT YEAR WERE YOU MARRIED:1950      DO YOU  LIVE IN A HOUSE, APARTMENT, ASSISTED LIVING, CONDO TRAILER ETC.: HOUSE       IS IT ONE OR MORE STORIES: 2 STORIES      HOW MANY PERSONS LIVE IN YOUR HOME: 1      DO YOU HAVE PETS IN YOUR HOME: 1 CAT AND A PART TIME DOG       CURRENT OR PAST PROFESSION: CIVIL SERVANT      DO YOU EXERCISE: NO       WHAT TYPE AND HOW OFTEN:NO   Social Determinants of Health   Financial Resource Strain:   . Difficulty of Paying Living Expenses: Not on file  Food Insecurity:   . Worried About Programme researcher, broadcasting/film/video in the Last Year: Not on file  . Ran Out of Food in the Last Year: Not on file  Transportation Needs:   . Lack of Transportation (Medical): Not on file  . Lack of Transportation (Non-Medical): Not on file  Physical Activity:   . Days of Exercise per Week: Not on file  . Minutes of Exercise per Session: Not on file  Stress:   . Feeling of Stress : Not on file  Social Connections:   . Frequency of Communication with Friends and Family: Not on file  . Frequency of Social Gatherings with Friends and Family: Not on file  . Attends Religious Services: Not on file  . Active Member of Clubs or Organizations: Not on file  . Attends Banker Meetings: Not on file  . Marital Status: Not on file  Intimate Partner Violence:   . Fear of Current or Ex-Partner: Not on file  .  Emotionally Abused: Not on file  . Physically Abused: Not on file  . Sexually Abused: Not on file    Family History  Problem Relation Age of Onset  . Hypercalcemia Daughter   . Hypertension Daughter     ROS- All systems are reviewed and negative except as per the HPI above  Physical Exam: Vitals:   03/22/20 1130  BP: 110/76  Pulse: (!) 105  Weight: 64 kg  Height: 5\' 3"  (1.6 m)    GEN- The patient is well appearing, alert and oriented x 3 today.   Head- normocephalic, atraumatic Eyes-  Sclera clear, conjunctiva pink Ears- hearing intact Oropharynx- clear Neck- supple, no JVP Lymph- no cervical  lymphadenopathy Lungs- Clear to ausculation bilaterally, normal work of breathing Heart- Irregular rate and rhythm, no murmurs, rubs or gallops, PMI not laterally displaced GI- soft, NT, ND, + BS Extremities- no clubbing, cyanosis, or edema MS- no significant deformity or atrophy Skin- no rash or lesion Psych- euthymic mood, full affect Neuro- strength and sensation are intact  EKG-afib at 105 bpm, qrs int 80 ms, qtc 444  Ms( at home v rates in the upper 80's to 90's)  Epic records reviewed   Assessment and Plan: 1.Long standing permanent afib Rate controlled Appears to be doing well Continue metoprolol ER 25 mg bid and cardizem 120 mg bid, appears to have HR well controlled with these doses Continue eliquis 5 mg bid  Cbc/bmet/tsh today   2. Hypothyroidism  Pt wishes to stay on 125 mcg levothyroxine daily,  the last  prescribed dose for her was 112 mcg   F/u 6 months  C. Lupita Leash [facility] [address] [place], [place] [phone]

## 2020-03-24 ENCOUNTER — Ambulatory Visit (HOSPITAL_COMMUNITY): Payer: Medicare Other | Admitting: Nurse Practitioner

## 2020-03-30 ENCOUNTER — Other Ambulatory Visit: Payer: Self-pay | Admitting: Nurse Practitioner

## 2020-03-30 MED ORDER — LEVOTHYROXINE SODIUM 112 MCG PO TABS: 112.0000 ug | ORAL_TABLET | Freq: Every day | ORAL | Status: DC

## 2020-05-11 ENCOUNTER — Telehealth (HOSPITAL_COMMUNITY): Payer: Self-pay

## 2020-05-11 NOTE — Telephone Encounter (Signed)
Patient called in and states she has been winded and having tightness in her chest. Symptoms since this morning. Blood pressure reading 141/70, Heart rate 90s. She previously had the same symptoms back in October and was recommended to take Lasix 20mg  for 3 days and this helped improve her symptoms. She has not been weighing herself. Patient instructed to take Lasix 20mg  daily and to contact the clinic back this afternoon or tomorrow am to let know how she is feeling. Per - NP. Patient verbalized understanding.

## 2020-05-12 ENCOUNTER — Other Ambulatory Visit: Payer: Self-pay | Admitting: Internal Medicine

## 2020-05-12 DIAGNOSIS — M1991 Primary osteoarthritis, unspecified site: Secondary | ICD-10-CM

## 2020-05-12 DIAGNOSIS — M48062 Spinal stenosis, lumbar region with neurogenic claudication: Secondary | ICD-10-CM

## 2020-05-12 NOTE — Telephone Encounter (Signed)
Patient is requesting refill on medication "Tramadol 50mg ". Patient last refill was 11/27/2019 with 60 tablets to be taken twice daily as needed. Patient has no additional refills and is due. Medication pend and sent to 01/28/2020, NP. Due to PCP Richarda Blade, NP being out of office. Please Advise.

## 2020-05-20 ENCOUNTER — Other Ambulatory Visit (HOSPITAL_COMMUNITY): Payer: Self-pay | Admitting: Nurse Practitioner

## 2020-05-20 ENCOUNTER — Encounter: Payer: Self-pay | Admitting: Internal Medicine

## 2020-05-20 ENCOUNTER — Other Ambulatory Visit: Payer: Self-pay

## 2020-05-20 ENCOUNTER — Ambulatory Visit (INDEPENDENT_AMBULATORY_CARE_PROVIDER_SITE_OTHER): Payer: Medicare Other | Admitting: Internal Medicine

## 2020-05-20 VITALS — BP 122/70 | HR 122 | Temp 97.9°F | Ht 63.0 in | Wt 139.8 lb

## 2020-05-20 DIAGNOSIS — M48062 Spinal stenosis, lumbar region with neurogenic claudication: Secondary | ICD-10-CM

## 2020-05-20 DIAGNOSIS — I480 Paroxysmal atrial fibrillation: Secondary | ICD-10-CM | POA: Diagnosis not present

## 2020-05-20 DIAGNOSIS — M1991 Primary osteoarthritis, unspecified site: Secondary | ICD-10-CM

## 2020-05-20 DIAGNOSIS — F339 Major depressive disorder, recurrent, unspecified: Secondary | ICD-10-CM

## 2020-05-20 DIAGNOSIS — E039 Hypothyroidism, unspecified: Secondary | ICD-10-CM | POA: Diagnosis not present

## 2020-05-20 MED ORDER — LEVOTHYROXINE SODIUM 125 MCG PO TABS: 125.0000 ug | ORAL_TABLET | Freq: Every day | ORAL | 3 refills | Status: DC

## 2020-05-20 MED ORDER — TRAMADOL HCL 50 MG PO TABS: 50.0000 mg | ORAL_TABLET | Freq: Two times a day (BID) | ORAL | 0 refills | Status: DC | PRN

## 2020-05-20 MED ORDER — MIRTAZAPINE 7.5 MG PO TABS: 7.5000 mg | ORAL_TABLET | Freq: Every day | ORAL | 5 refills | Status: DC

## 2020-05-20 NOTE — Progress Notes (Signed)
Location:  [facility] Provider:  Marks Scalera L. Renato Gails, D.O., C.M.D.  Goals of Care:  Advanced Directives 11/27/2019  Does Patient Have a Medical Advance Directive? -  Type of Estate agent of [place];Out of facility DNR (pink MOST or yellow form);Living will  Does patient want to make changes to medical advance directive? No - Patient declined  Copy of Healthcare Power of Attorney in Chart? No - copy requested  Would patient like information on creating a medical advance directive? -  Pre-existing out of facility DNR order (yellow form or pink MOST form) -   Chief Complaint  Patient presents with  . Acute Visit    Discuss medications synthroid, patient is taking a different dose.  Here with daughter.     HPI: Patient is a 86 y.o. female seen today for medical management of chronic diseases.    Pt doing ok.  Is able to do some things but arthritis limits ability to cut up veggies for soup.    She still has thinning hair and fatigue.  She is taking or synthroid and never did take the 112 previously recommended.  She requested 125 alternating with but already had rapid afib upon arrival that did improve over the course of the visit when I examined her.  Her last tsh was in normal range.   She admits to being depressed some days and is willing to try a medication.  Her appetite is not that good either.  She does sleep well at night.    Past Medical History:  Diagnosis Date  . A-fib (HCC) 05/2015   HOSPITALIZED   . Arthritis   . Cataract   . Hypothyroidism   . Permanent atrial fibrillation (HCC)   . Thyroid disease     Past Surgical History:  Procedure Laterality Date  . CARDIOVERSION N/A 06/08/2015   Procedure: CARDIOVERSION;  Surgeon: Jake Bathe, MD;  Location: [facility] ENDOSCOPY;  Service: Cardiovascular;  Laterality: N/A;  . CARDIOVERSION N/A 09/02/2015   Procedure: CARDIOVERSION;  Surgeon: Laqueta Linden, MD;  Location: [facility] ENDOSCOPY;  Service:  Cardiovascular;  Laterality: N/A;  . CATARACT EXTRACTION, BILATERAL Bilateral 2010  . JOINT REPLACEMENT    . KNEE SURGERY  2002  . right hip replacement    . RIGHT KNEE REPLACEMENT  2011  . TEE WITHOUT CARDIOVERSION N/A 06/08/2015   Procedure: TRANSESOPHAGEAL ECHOCARDIOGRAM (TEE);  Surgeon: Jake Bathe, MD;  Location: [facility] ENDOSCOPY;  Service: Cardiovascular;  Laterality: N/A;  . TOTAL HIP ARTHROPLASTY Right 2011  . TOTAL SHOULDER REPLACEMENT Right 2003  . TOTAL SHOULDER REPLACEMENT Left 2007  . WRIST SURGERY Bilateral 2008   both wrists    Allergies  Allergen Reactions  . Banana Nausea Only    All banana products also  . Penicillins Hives    Outpatient Encounter Medications as of 05/20/2020  Medication Sig  . CARDIZEM LA 120 MG 24 hr tablet TAKE 1 TABLET (120 MG TOTAL) BY MOUTH 2 (TWO) TIMES DAILY.  Marland Kitchen docusate sodium (COLACE) 100 MG capsule Take 1 capsule (100 mg total) by mouth daily as needed for mild constipation.  Marland Kitchen ELIQUIS 5 MG TABS tablet TAKE 1 TABLET BY MOUTH TWICE A DAY  . furosemide (LASIX) 20 MG tablet TAKE 1 TABLET DAILY AS NEEDED FOR WEIGHT GAIN >3LBS.  Marland Kitchen metoprolol succinate (TOPROL-XL) 25 MG 24 hr tablet TAKE 1 TABLET BY MOUTH TWICE A DAY  . traMADol (ULTRAM) 50 MG tablet TAKE 1 TABLET (50 MG TOTAL) BY  MOUTH 2 (TWO) TIMES DAILY AS NEEDED FOR MODERATE PAIN OR SEVERE PAIN.  . [DISCONTINUED] levothyroxine (SYNTHROID) 125 MCG tablet Take 125 mcg by mouth daily before breakfast.  . levothyroxine (SYNTHROID) 125 MCG tablet Take 1 tablet (125 mcg total) by mouth daily before breakfast.  . [DISCONTINUED] levothyroxine (SYNTHROID) 112 MCG tablet Take 1 tablet (112 mcg total) by mouth daily before breakfast.   No facility-administered encounter medications on file as of 05/20/2020.    Review of Systems:  Review of Systems  Constitutional: Positive for malaise/fatigue. Negative for chills and fever.       Wt down about 2 lbs  HENT: Negative for congestion and sore throat.    Eyes:       Glasses  Respiratory: Positive for shortness of breath. Negative for cough.   Cardiovascular: Negative for chest pain, palpitations and leg swelling.       Had sob last week and given diuretics from cardiology which helped   Gastrointestinal: Negative for abdominal pain, blood in stool, constipation, diarrhea and melena.  Genitourinary: Negative for dysuria.  Musculoskeletal: Positive for back pain and joint pain. Negative for falls.  Neurological: Positive for weakness. Negative for dizziness.  Psychiatric/Behavioral: Positive for depression. Negative for memory loss. The patient is nervous/anxious. The patient does not have insomnia.     Health Maintenance  Topic Date Due  . TETANUS/TDAP  10/11/2026  . INFLUENZA VACCINE  Completed  . DEXA SCAN  Completed  . COVID-19 Vaccine  Completed  . PNA vac Low Risk Adult  Completed    Physical Exam: Vitals:   05/20/20 0943  BP: 122/70  Pulse: (!) 122  Temp: 97.9 F (36.6 C)  TempSrc: Temporal  SpO2: 98%  Weight: 139 lb 12.8 oz (63.4 kg)  Height: 5\' 3"  (1.6 m)   Body mass index is 24.76 kg/m. Physical Exam Vitals reviewed.  Constitutional:      General: She is not in acute distress.    Appearance: Normal appearance. She is not toxic-appearing.  HENT:     Head: Normocephalic and atraumatic.  Eyes:     Conjunctiva/sclera: Conjunctivae normal.     Pupils: Pupils are equal, round, and reactive to light.     Comments: glasses  Cardiovascular:     Rate and Rhythm: Tachycardia present. Rhythm irregular.  Pulmonary:     Effort: Pulmonary effort is normal.     Breath sounds: Normal breath sounds. No wheezing, rhonchi or rales.  Abdominal:     General: Bowel sounds are normal.  Musculoskeletal:     Right lower leg: No edema.     Left lower leg: No edema.  Skin:    General: Skin is warm and dry.  Neurological:     General: No focal deficit present.     Mental Status: She is alert and oriented to person, place,  and time.  Psychiatric:        Mood and Affect: Mood normal.     Labs reviewed: Basic Metabolic Panel: Recent Labs    07/09/19 1438 11/12/19 1620 03/22/20 1215  NA 141  --  139  K 4.6  --  4.4  CL 104  --  101  CO2 28  --  30  GLUCOSE 93  --  109*  BUN 19  --  11  CREATININE 0.79  --  0.85  CALCIUM 10.3  --  9.9  TSH 0.37* 0.33* 0.453   Liver Function Tests: Recent Labs    07/09/19 1438  AST 14  ALT 8  BILITOT 0.5  PROT 6.5   No results for input(s): LIPASE, AMYLASE in the last 8760 hours. No results for input(s): AMMONIA in the last 8760 hours. CBC: Recent Labs    07/09/19 1438 03/22/20 1215  WBC 6.7 7.0  NEUTROABS 4,201  --   HGB 13.7 13.5  HCT 41.2 43.1  MCV 89.6 94.3  PLT 308 315   Lipid Panel: No results for input(s): CHOL, HDL, LDLCALC, TRIG, CHOLHDL, LDLDIRECT in the last 8760 hours. No results found for: HGBA1C  Procedures since last visit: No results found.  Assessment/Plan 1. Hypothyroidism, unspecified type -cont levothyroxine daily which she reports is what she's been taking not the that was on her list -wants more but not indicated based on levels and risk for more rapid afib and chf  2. PAF (paroxysmal atrial fibrillation) (HCC) -rate typically controlled when her daughter checks her vitals at home, improved after she calmed down here today  3. Primary osteoarthritis, unspecified site - traMADol (ULTRAM) 50 MG tablet; Take 1 tablet (50 mg total) by mouth 2 (two) times daily as needed for moderate pain or severe pain.  Dispense: 60 tablet; Refill: 0  4. Depression, recurrent (HCC) -start remeron for mood and appetite - mirtazapine (REMERON) 7.5 MG tablet; Take 1 tablet (7.5 mg total) by mouth at bedtime.  Dispense: 90 tablet; Refill: 5  5. Spinal stenosis of lumbar region with neurogenic claudication -ongoing pain from this and her OA in hands especially -is very conservative with her tramadol use - traMADol (ULTRAM) 50  MG tablet; Take 1 tablet (50 mg total) by mouth 2 (two) times daily as needed for moderate pain or severe pain.  Dispense: 60 tablet; Refill: 0  Labs/tests ordered:   Lab Orders  No laboratory test(s) ordered today   Next appt:  11/08/2020   Preslee Regas L. Joaovictor Krone, D.O. Geriatrics [facility] [address], [place] [address] Cell Phone (Mon-Fri 8am-5pm):  [phone] On Call:  [phone] & follow prompts after 5pm & weekends Office Phone:  [phone] Office Fax:  [phone]

## 2020-07-06 ENCOUNTER — Telehealth: Payer: Self-pay | Admitting: *Deleted

## 2020-07-06 NOTE — Telephone Encounter (Signed)
Would recommended taking half tablet for 1 week then can stop.

## 2020-07-06 NOTE — Telephone Encounter (Signed)
Katrina Velez, daughter, called and stated that Dr. Renato Gails placed patient on Mirtazapine on 05/20/2020 and ever since patient started taking medication it causes her to be so sleepy. Has always had pretty good energy. Been falling asleep sitting up and never has done this before since starting the pill. Doesn't think she needs the pill.   They are wondering if she can discontinue the medication.   Please Advise.     OV NOTE DATED 05/20/2020: 4. Depression, recurrent (HCC) -start remeron for mood and appetite - mirtazapine (REMERON) 7.5 MG tablet; Take 1 tablet (7.5 mg total) by mouth at bedtime.  Dispense: 90 tablet; Refill: 5

## 2020-07-06 NOTE — Telephone Encounter (Signed)
Daughter Notified and agreed.  

## 2020-08-16 ENCOUNTER — Other Ambulatory Visit (HOSPITAL_COMMUNITY): Payer: Self-pay | Admitting: Nurse Practitioner

## 2020-08-16 DIAGNOSIS — I4821 Permanent atrial fibrillation: Secondary | ICD-10-CM

## 2020-09-03 DIAGNOSIS — Z23 Encounter for immunization: Secondary | ICD-10-CM | POA: Diagnosis not present

## 2020-09-16 ENCOUNTER — Ambulatory Visit: Payer: Medicare Other | Admitting: Internal Medicine

## 2020-09-16 DIAGNOSIS — F3340 Major depressive disorder, recurrent, in remission, unspecified: Secondary | ICD-10-CM | POA: Diagnosis not present

## 2020-09-16 DIAGNOSIS — E039 Hypothyroidism, unspecified: Secondary | ICD-10-CM | POA: Diagnosis not present

## 2020-09-16 DIAGNOSIS — G5600 Carpal tunnel syndrome, unspecified upper limb: Secondary | ICD-10-CM | POA: Diagnosis not present

## 2020-09-16 DIAGNOSIS — E559 Vitamin D deficiency, unspecified: Secondary | ICD-10-CM | POA: Diagnosis not present

## 2020-09-16 DIAGNOSIS — D6869 Other thrombophilia: Secondary | ICD-10-CM | POA: Diagnosis not present

## 2020-09-16 DIAGNOSIS — I10 Essential (primary) hypertension: Secondary | ICD-10-CM | POA: Diagnosis not present

## 2020-09-16 DIAGNOSIS — I480 Paroxysmal atrial fibrillation: Secondary | ICD-10-CM | POA: Diagnosis not present

## 2020-09-16 DIAGNOSIS — Z79899 Other long term (current) drug therapy: Secondary | ICD-10-CM | POA: Diagnosis not present

## 2020-09-16 DIAGNOSIS — H9190 Unspecified hearing loss, unspecified ear: Secondary | ICD-10-CM | POA: Diagnosis not present

## 2020-09-16 DIAGNOSIS — F17211 Nicotine dependence, cigarettes, in remission: Secondary | ICD-10-CM | POA: Diagnosis not present

## 2020-09-16 DIAGNOSIS — I70201 Unspecified atherosclerosis of native arteries of extremities, right leg: Secondary | ICD-10-CM | POA: Diagnosis not present

## 2020-09-16 DIAGNOSIS — K5900 Constipation, unspecified: Secondary | ICD-10-CM | POA: Diagnosis not present

## 2020-09-21 ENCOUNTER — Ambulatory Visit: Payer: Medicare Other | Admitting: Family Medicine

## 2020-09-28 DIAGNOSIS — H04123 Dry eye syndrome of bilateral lacrimal glands: Secondary | ICD-10-CM | POA: Diagnosis not present

## 2020-09-28 DIAGNOSIS — H1131 Conjunctival hemorrhage, right eye: Secondary | ICD-10-CM | POA: Diagnosis not present

## 2020-09-28 DIAGNOSIS — Z961 Presence of intraocular lens: Secondary | ICD-10-CM | POA: Diagnosis not present

## 2020-09-30 ENCOUNTER — Other Ambulatory Visit: Payer: Self-pay

## 2020-09-30 ENCOUNTER — Encounter (HOSPITAL_COMMUNITY): Payer: Self-pay | Admitting: Nurse Practitioner

## 2020-09-30 ENCOUNTER — Ambulatory Visit (HOSPITAL_COMMUNITY)
Admission: RE | Admit: 2020-09-30 | Discharge: 2020-09-30 | Disposition: A | Discharge status: Home / Self Care | Payer: Medicare Other | Source: Ambulatory Visit | Attending: Nurse Practitioner | Admitting: Nurse Practitioner

## 2020-09-30 VITALS — BP 138/78 | HR 89 | Ht 63.0 in | Wt 136.2 lb

## 2020-09-30 DIAGNOSIS — Z87891 Personal history of nicotine dependence: Secondary | ICD-10-CM | POA: Insufficient documentation

## 2020-09-30 DIAGNOSIS — I4821 Permanent atrial fibrillation: Secondary | ICD-10-CM | POA: Insufficient documentation

## 2020-09-30 DIAGNOSIS — E039 Hypothyroidism, unspecified: Secondary | ICD-10-CM | POA: Insufficient documentation

## 2020-09-30 DIAGNOSIS — D6869 Other thrombophilia: Secondary | ICD-10-CM | POA: Diagnosis not present

## 2020-09-30 DIAGNOSIS — Z79890 Hormone replacement therapy (postmenopausal): Secondary | ICD-10-CM | POA: Diagnosis not present

## 2020-09-30 DIAGNOSIS — Z7901 Long term (current) use of anticoagulants: Secondary | ICD-10-CM | POA: Diagnosis not present

## 2020-09-30 NOTE — Progress Notes (Signed)
Patient ID: Katrina Velez, female   DOB: 07-Nov-1925, 87 y.o.   MRN: [id number]       Primary Care Physician: Katrina Kuster, MD Referring Physician: Dr. Laqueta Jean Velez is a 87 y.o. female with a h/o afib that was loaded on amiodarone and successfully cardioverted 4/13, but with ERAF. She did not see that much difference in SR and had some thyroid concerns  already on replacement, so decision was made to stop amiodarone and aim for rate control,HR has been staying below 100 at home. Her energy is good for her age and she is back to hanging clothes outside. No issues with anticoagulant.   Returns to afib clinic 8/23, after she has noticed at home some fluctuation in heart rate. Her daughter on taking her heart rate and Bp one day, noticed that hr heart rate was 48 bpm, although the pt did not feel bad. She talked to the on call provider and was advised to skip the next rate control drug. A few days later, the pt had a nonsustained episode of rvr at 150 bpm. She did feel lightheaded with that episode. On call provider was contacted and she was told to how to take extra rate control if needed. However after a few more minutes, her HR slowed down.  EKG showed afib with controlled afib. She is also upset because she feels that she needs more thyroid replacement. She has also felt better on a higher dose than what the labs suggest that she takes. Her synthroid was reduced several months back and the pt is c/o thinning hair, thinning nails, more fatigue, depression. She is planning to change to a new MD in the first of October to see if she can get straightened out with her thyroid.   Returns 9/6, holter monitor placed on previous visit and reviewed. Shows average HR around 107 bpm, high rate 160 bpm, minimum 40 bpm at 4:30 am. Pt was not aware of fast or slow heart rate.She could benefit from more rate control. She is still able to do her house work with minimal symptoms and feels that she has good  rate control.   F/u in the afib clinic 10/25 and has good rate control. Currently is on metoprolol ER 25 mg bid and cardizem 120 mg bid. Tried higher doses of BB but could not tolerate. She is tolerating this combination of rate control with HR in the 80's.   F/u in afib clinic,4/25, she is feeling well. Does not feel afib . No issues with shortness of breath or pedal edema. No bleeding issues with eliquis.  F/u in fib clinic, 09/20/17. She has now turned 89 and is doing well. She did lacerate her leg on a patio chair about 6 weeks  Ago and did need stitches. It is healing but a large dark scab is still present. Otherwise, no complaints.  F/u in afib clinic, 03/28/18. She is doing very well. No complaints. It took the area on her leg described above, 6 months to heal and eventually this was done with the help of the would clinic. She is now 89 and going strong. Her renal panel for June showed a creatinine of 0.84 and with her current weight, she still qualifies for 5 mg eliquis bid. No bleeding issues.  F/u in afib clinic, 03/25/19. She continues in rate controlled afib.She did have a fall at home  and went to the ER with bruising and a couple of stitches.No major trauma. She  is now walking with a cane in the house and has a fall alert bracelet now. Does not know why she fell, just  got off balance.    F/u 09/23/19 in afib clinic. She feels well. Relieved to have has her covid shots so she can be more active. She and her daughter went to the beach in April and she really enjoyed the change in scenery. Her afib is rate controlled and she does not feel it. She  had a major fall last fall, mostly bruising, but no further reports of fall. Walks with a cane.     F/u in afib clinic, 03/22/20. Overall no  issues with afib, she is rate controlled   at home in the 80's/90's. Her biggest concern is that she  feels she needs at least 125 mcg levothyroxine to feel her best. This  has been her dose for years and when she  tries to reduce it, she feels terrible. She states that Katrina Velez  before he retired would listen to her and her reason for the higher dose, not go strictly by lab values,  but her last few  physician's want to go strictly by the TSH. I explained to her that the lab  values are what is used to adjust meds but she feels it should be how she feels. She was recommended by Katrina Velez to reduce to 112 mcg daily but she is taking an higher dose than  this, having older med doses  around the house, I believe she indicated she was taking 125 mcg daily.  F/u in afib clinic, 09/30/20. She remains in permanent afib with controlled ventricular rate. No recent falls or other complaints today. Her weight has declined but still above 60 kgs (61.8 kgs) and creatinine in less than 1.5, so  she will continue on eliquis 5 mg bid. No bleeding issues.   Today, she denies symptoms of palpitations, chest pain, shortness of breath, orthopnea, PND, lower extremity edema, dizziness, presyncope, syncope, or neurologic sequela. The patient is tolerating medications without difficulties and is otherwise without complaint today.   Past Medical History:  Diagnosis Date  . A-fib (HCC) 05/2015   HOSPITALIZED   . Arthritis   . Cataract   . Hypothyroidism   . Permanent atrial fibrillation (HCC)   . Thyroid disease    Past Surgical History:  Procedure Laterality Date  . CARDIOVERSION N/A 06/08/2015   Procedure: CARDIOVERSION;  Surgeon: Jake BatheMark C Skains, MD;  [facility];  Service: Cardiovascular;  Laterality: N/A;  . CARDIOVERSION N/A 09/02/2015   Procedure: CARDIOVERSION;  Surgeon: Katrina LindenSuresh A Koneswaran, MD;  [facility];  Service: Cardiovascular;  Laterality: N/A;  . CATARACT EXTRACTION, BILATERAL Bilateral 2010  . JOINT REPLACEMENT    . KNEE SURGERY  2002  . right hip replacement    . RIGHT KNEE REPLACEMENT  2011  . TEE WITHOUT CARDIOVERSION N/A 06/08/2015   Procedure: TRANSESOPHAGEAL ECHOCARDIOGRAM (TEE);   Surgeon: Jake BatheMark C Skains, MD;  [facility];  Service: Cardiovascular;  Laterality: N/A;  . TOTAL HIP ARTHROPLASTY Right 2011  . TOTAL SHOULDER REPLACEMENT Right 2003  . TOTAL SHOULDER REPLACEMENT Left 2007  . WRIST SURGERY Bilateral 2008   both wrists    Current Outpatient Medications  Medication Sig Dispense Refill  . CARDIZEM LA 120 MG 24 hr tablet TAKE 1 TABLET (120 MG TOTAL) BY MOUTH 2 (TWO) TIMES DAILY. 180 tablet 1  . docusate sodium (COLACE) 100 MG capsule Take 1 capsule (100 mg  total) by mouth daily as needed for mild constipation. 10 capsule 0  . ELIQUIS 5 MG TABS tablet TAKE 1 TABLET BY MOUTH TWICE A DAY 180 tablet 1  . furosemide (LASIX) 20 MG tablet TAKE 1 TABLET DAILY AS NEEDED FOR WEIGHT GAIN >3LBS. 30 tablet 2  . levothyroxine (SYNTHROID) 125 MCG tablet Take 1 tablet (125 mcg total) by mouth daily before breakfast. 90 tablet 3  . metoprolol succinate (TOPROL-XL) 25 MG 24 hr tablet TAKE 1 TABLET BY MOUTH TWICE A DAY 180 tablet 1  . mirtazapine (REMERON) 7.5 MG tablet Take 1 tablet (7.5 mg total) by mouth at bedtime. 90 tablet 5  . traMADol (ULTRAM) 50 MG tablet Take 1 tablet (50 mg total) by mouth 2 (two) times daily as needed for moderate pain or severe pain. 60 tablet 0   No current facility-administered medications for this encounter.    Allergies  Allergen Reactions  . Banana Nausea Only    All banana products also  . Penicillins Hives    Social History   Socioeconomic History  . Marital status: Widowed    Spouse name: Not on file  . Number of children: Not on file  . Years of education: Not on file  . Highest education level: Not on file  Occupational History  . Not on file  Tobacco Use  . Smoking status: Former Smoker    Packs/day: 1.00    Years: 65.00    Pack years: 65.00    Types: Cigarettes    Quit date: 05/28/2011    Years since quitting: 9.3  . Smokeless tobacco: Never Used  Vaping Use  . Vaping Use: Former  Substance and Sexual  Activity  . Alcohol use: Yes    Alcohol/week: 1.0 - 2.0 standard drink    Types: 1 - 2 Glasses of wine per week    Comment: occasional drink  . Drug use: No  . Sexual activity: Not on file  Other Topics Concern  . Not on file  Social History Narrative   DIET: NONE      DO YOU DRINK/EAT THINGS WITH CAFFEINE: YES, COFFEE AND TEA       MARITAL STATUS: WIDOWED      WHAT YEAR WERE YOU MARRIED:1950      DO YOU LIVE IN A HOUSE, APARTMENT, ASSISTED LIVING, CONDO TRAILER ETC.: HOUSE       IS IT ONE OR MORE STORIES: 2 STORIES      HOW MANY PERSONS LIVE IN YOUR HOME: 1      DO YOU HAVE PETS IN YOUR HOME: 1 CAT AND A PART TIME DOG       CURRENT OR PAST PROFESSION: CIVIL SERVANT      DO YOU EXERCISE: NO       WHAT TYPE AND HOW OFTEN:NO   Social Determinants of Health   Financial Resource Strain: Not on file  Food Insecurity: Not on file  Transportation Needs: Not on file  Physical Activity: Not on file  Stress: Not on file  Social Connections: Not on file  Intimate Partner Violence: Not on file    Family History  Problem Relation Age of Onset  . Hypercalcemia Daughter   . Hypertension Daughter     ROS- All systems are reviewed and negative except as per the HPI above  Physical Exam: There were no vitals filed for this visit.  GEN- The patient is well appearing, alert and oriented x 3 today.   Head- normocephalic, atraumatic Eyes-  Sclera clear, conjunctiva pink Ears- hearing intact Oropharynx- clear Neck- supple, no JVP Lymph- no cervical lymphadenopathy Lungs- Clear to ausculation bilaterally, normal work of breathing Heart- Irregular rate and rhythm, no murmurs, rubs or gallops, PMI not laterally displaced GI- soft, NT, ND, + BS Extremities- no clubbing, cyanosis, or edema MS- no significant deformity or atrophy Skin- no rash or lesion Psych- euthymic mood, full affect Neuro- strength and sensation are intact  EKG-afib at 89 bpm, qrs int 82 ms, qtc 430  ms Epic records reviewed   Assessment and Plan: 1.Long standing permanent afib Rate controlled Appears to be doing well Continue metoprolol ER 25 mg bid and cardizem 120 mg bid, appears to have HR well controlled with these doses Continue eliquis 5 mg bid, appropriately dosed at this time  Has physical appointment with labs in mid June   2. Hypothyroidism  Per PCP   F/u 6 months  Lupita Leash C. Matthew Fol[facility] [address] [place], [place] [address] [phone]

## 2020-10-19 DIAGNOSIS — I10 Essential (primary) hypertension: Secondary | ICD-10-CM | POA: Diagnosis not present

## 2020-10-19 DIAGNOSIS — G5600 Carpal tunnel syndrome, unspecified upper limb: Secondary | ICD-10-CM | POA: Diagnosis not present

## 2020-10-19 DIAGNOSIS — D6869 Other thrombophilia: Secondary | ICD-10-CM | POA: Diagnosis not present

## 2020-10-19 DIAGNOSIS — I70201 Unspecified atherosclerosis of native arteries of extremities, right leg: Secondary | ICD-10-CM | POA: Diagnosis not present

## 2020-10-19 DIAGNOSIS — H9190 Unspecified hearing loss, unspecified ear: Secondary | ICD-10-CM | POA: Diagnosis not present

## 2020-10-19 DIAGNOSIS — Z0001 Encounter for general adult medical examination with abnormal findings: Secondary | ICD-10-CM | POA: Diagnosis not present

## 2020-10-19 DIAGNOSIS — Z7189 Other specified counseling: Secondary | ICD-10-CM | POA: Diagnosis not present

## 2020-10-19 DIAGNOSIS — Z7289 Other problems related to lifestyle: Secondary | ICD-10-CM | POA: Diagnosis not present

## 2020-10-19 DIAGNOSIS — M1990 Unspecified osteoarthritis, unspecified site: Secondary | ICD-10-CM | POA: Diagnosis not present

## 2020-10-19 DIAGNOSIS — E039 Hypothyroidism, unspecified: Secondary | ICD-10-CM | POA: Diagnosis not present

## 2020-10-19 DIAGNOSIS — F17211 Nicotine dependence, cigarettes, in remission: Secondary | ICD-10-CM | POA: Diagnosis not present

## 2020-11-08 ENCOUNTER — Telehealth: Payer: Self-pay

## 2020-11-08 ENCOUNTER — Encounter: Payer: Self-pay | Admitting: Nurse Practitioner

## 2020-11-08 ENCOUNTER — Other Ambulatory Visit: Payer: Self-pay

## 2020-11-08 ENCOUNTER — Encounter: Payer: Medicare Other | Admitting: Nurse Practitioner

## 2020-11-08 NOTE — Progress Notes (Deleted)
   This service is provided via telemedicine  No vital signs collected/recorded due to the encounter was a telemedicine visit.   Location of patient (ex: home, work):  Home  Patient consents to a telephone visit: Yes, see telephone visit dated 11/08/20  Location of the provider (ex: office, home):  [facility], Office   Name of any referring provider:  N/A  Names of all persons participating in the telemedicine service and their role in the encounter:  S.Chrae B/CMA, Abbey Chatters, NP, and Patient   Time spent on call:  9 min with medical assistant

## 2020-11-08 NOTE — Telephone Encounter (Addendum)
  After 2 attempts to call patient to complete her AWV today I called her daughter Harriett Sine.  Per Harriett Sine patient has changed her PCP and is no longer a patient at [facility].

## 2020-11-08 NOTE — Progress Notes (Signed)
Patient is no longer a [facility] patient per conversation with her daughter Harriett Sine

## 2020-11-09 NOTE — Progress Notes (Signed)
This encounter was created in error - please disregard.

## 2020-11-15 ENCOUNTER — Inpatient Hospital Stay (HOSPITAL_COMMUNITY)
Admission: EM | Admit: 2020-11-15 | Discharge: 2020-11-24 | DRG: 481 | Disposition: A | Discharge status: Skilled Nursing Facility | Payer: Medicare Other | Attending: Internal Medicine | Admitting: Internal Medicine

## 2020-11-15 ENCOUNTER — Other Ambulatory Visit (HOSPITAL_COMMUNITY): Payer: Self-pay | Admitting: Nurse Practitioner

## 2020-11-15 DIAGNOSIS — R451 Restlessness and agitation: Secondary | ICD-10-CM | POA: Diagnosis not present

## 2020-11-15 DIAGNOSIS — Z96641 Presence of right artificial hip joint: Secondary | ICD-10-CM | POA: Diagnosis present

## 2020-11-15 DIAGNOSIS — W1830XA Fall on same level, unspecified, initial encounter: Secondary | ICD-10-CM | POA: Diagnosis present

## 2020-11-15 DIAGNOSIS — J301 Allergic rhinitis due to pollen: Secondary | ICD-10-CM | POA: Diagnosis present

## 2020-11-15 DIAGNOSIS — F05 Delirium due to known physiological condition: Secondary | ICD-10-CM | POA: Diagnosis not present

## 2020-11-15 DIAGNOSIS — N1831 Chronic kidney disease, stage 3a: Secondary | ICD-10-CM | POA: Diagnosis present

## 2020-11-15 DIAGNOSIS — M9701XA Periprosthetic fracture around internal prosthetic right hip joint, initial encounter: Secondary | ICD-10-CM | POA: Diagnosis not present

## 2020-11-15 DIAGNOSIS — Z91018 Allergy to other foods: Secondary | ICD-10-CM

## 2020-11-15 DIAGNOSIS — Z66 Do not resuscitate: Secondary | ICD-10-CM | POA: Diagnosis present

## 2020-11-15 DIAGNOSIS — I4891 Unspecified atrial fibrillation: Secondary | ICD-10-CM | POA: Diagnosis not present

## 2020-11-15 DIAGNOSIS — E039 Hypothyroidism, unspecified: Secondary | ICD-10-CM | POA: Diagnosis present

## 2020-11-15 DIAGNOSIS — D62 Acute posthemorrhagic anemia: Secondary | ICD-10-CM

## 2020-11-15 DIAGNOSIS — S72331A Displaced oblique fracture of shaft of right femur, initial encounter for closed fracture: Principal | ICD-10-CM | POA: Diagnosis present

## 2020-11-15 DIAGNOSIS — Z20822 Contact with and (suspected) exposure to covid-19: Secondary | ICD-10-CM | POA: Diagnosis not present

## 2020-11-15 DIAGNOSIS — D72829 Elevated white blood cell count, unspecified: Secondary | ICD-10-CM | POA: Diagnosis present

## 2020-11-15 DIAGNOSIS — Z01818 Encounter for other preprocedural examination: Secondary | ICD-10-CM

## 2020-11-15 DIAGNOSIS — R609 Edema, unspecified: Secondary | ICD-10-CM | POA: Diagnosis not present

## 2020-11-15 DIAGNOSIS — Z419 Encounter for procedure for purposes other than remedying health state, unspecified: Secondary | ICD-10-CM

## 2020-11-15 DIAGNOSIS — I517 Cardiomegaly: Secondary | ICD-10-CM | POA: Diagnosis not present

## 2020-11-15 DIAGNOSIS — E890 Postprocedural hypothyroidism: Secondary | ICD-10-CM | POA: Diagnosis present

## 2020-11-15 DIAGNOSIS — S7290XA Unspecified fracture of unspecified femur, initial encounter for closed fracture: Secondary | ICD-10-CM

## 2020-11-15 DIAGNOSIS — Z79890 Hormone replacement therapy (postmenopausal): Secondary | ICD-10-CM

## 2020-11-15 DIAGNOSIS — S72401A Unspecified fracture of lower end of right femur, initial encounter for closed fracture: Secondary | ICD-10-CM | POA: Diagnosis present

## 2020-11-15 DIAGNOSIS — M1990 Unspecified osteoarthritis, unspecified site: Secondary | ICD-10-CM | POA: Diagnosis present

## 2020-11-15 DIAGNOSIS — Z7901 Long term (current) use of anticoagulants: Secondary | ICD-10-CM

## 2020-11-15 DIAGNOSIS — H9190 Unspecified hearing loss, unspecified ear: Secondary | ICD-10-CM | POA: Diagnosis present

## 2020-11-15 DIAGNOSIS — M1991 Primary osteoarthritis, unspecified site: Secondary | ICD-10-CM

## 2020-11-15 DIAGNOSIS — Z79899 Other long term (current) drug therapy: Secondary | ICD-10-CM

## 2020-11-15 DIAGNOSIS — J449 Chronic obstructive pulmonary disease, unspecified: Secondary | ICD-10-CM | POA: Diagnosis present

## 2020-11-15 DIAGNOSIS — M9711XA Periprosthetic fracture around internal prosthetic right knee joint, initial encounter: Secondary | ICD-10-CM | POA: Diagnosis present

## 2020-11-15 DIAGNOSIS — W19XXXA Unspecified fall, initial encounter: Secondary | ICD-10-CM

## 2020-11-15 DIAGNOSIS — Z79891 Long term (current) use of opiate analgesic: Secondary | ICD-10-CM

## 2020-11-15 DIAGNOSIS — S72009A Fracture of unspecified part of neck of unspecified femur, initial encounter for closed fracture: Secondary | ICD-10-CM | POA: Diagnosis not present

## 2020-11-15 DIAGNOSIS — I499 Cardiac arrhythmia, unspecified: Secondary | ICD-10-CM | POA: Diagnosis not present

## 2020-11-15 DIAGNOSIS — D75839 Thrombocytosis, unspecified: Secondary | ICD-10-CM | POA: Diagnosis not present

## 2020-11-15 DIAGNOSIS — Z96651 Presence of right artificial knee joint: Secondary | ICD-10-CM | POA: Diagnosis present

## 2020-11-15 DIAGNOSIS — S7291XA Unspecified fracture of right femur, initial encounter for closed fracture: Secondary | ICD-10-CM | POA: Diagnosis not present

## 2020-11-15 DIAGNOSIS — Z87891 Personal history of nicotine dependence: Secondary | ICD-10-CM

## 2020-11-15 DIAGNOSIS — S72301A Unspecified fracture of shaft of right femur, initial encounter for closed fracture: Secondary | ICD-10-CM

## 2020-11-15 DIAGNOSIS — Z96612 Presence of left artificial shoulder joint: Secondary | ICD-10-CM | POA: Diagnosis present

## 2020-11-15 DIAGNOSIS — Z96611 Presence of right artificial shoulder joint: Secondary | ICD-10-CM | POA: Diagnosis present

## 2020-11-15 DIAGNOSIS — I4821 Permanent atrial fibrillation: Secondary | ICD-10-CM | POA: Diagnosis present

## 2020-11-15 DIAGNOSIS — R17 Unspecified jaundice: Secondary | ICD-10-CM | POA: Diagnosis not present

## 2020-11-15 DIAGNOSIS — M48062 Spinal stenosis, lumbar region with neurogenic claudication: Secondary | ICD-10-CM

## 2020-11-15 DIAGNOSIS — Z880 Allergy status to penicillin: Secondary | ICD-10-CM

## 2020-11-15 DIAGNOSIS — Z743 Need for continuous supervision: Secondary | ICD-10-CM | POA: Diagnosis not present

## 2020-11-15 DIAGNOSIS — E871 Hypo-osmolality and hyponatremia: Secondary | ICD-10-CM | POA: Diagnosis not present

## 2020-11-15 NOTE — ED Triage Notes (Signed)
Patient was on floor upon EMS arrival.  Patient has right thigh pain. CMS in tact. Having a hard time picking up her right leg. Right leg swollen and appears a bit deformed. 5/10 pain with no movement. Denies hitting head, on Eliquist for afib.

## 2020-11-16 ENCOUNTER — Emergency Department (HOSPITAL_COMMUNITY): Payer: Medicare Other

## 2020-11-16 ENCOUNTER — Other Ambulatory Visit: Payer: Self-pay

## 2020-11-16 ENCOUNTER — Encounter (HOSPITAL_COMMUNITY): Payer: Self-pay | Admitting: Internal Medicine

## 2020-11-16 DIAGNOSIS — I10 Essential (primary) hypertension: Secondary | ICD-10-CM | POA: Diagnosis not present

## 2020-11-16 DIAGNOSIS — R0902 Hypoxemia: Secondary | ICD-10-CM | POA: Diagnosis not present

## 2020-11-16 DIAGNOSIS — Z7901 Long term (current) use of anticoagulants: Secondary | ICD-10-CM | POA: Diagnosis not present

## 2020-11-16 DIAGNOSIS — F05 Delirium due to known physiological condition: Secondary | ICD-10-CM | POA: Diagnosis not present

## 2020-11-16 DIAGNOSIS — H9190 Unspecified hearing loss, unspecified ear: Secondary | ICD-10-CM | POA: Diagnosis present

## 2020-11-16 DIAGNOSIS — R2681 Unsteadiness on feet: Secondary | ICD-10-CM | POA: Diagnosis not present

## 2020-11-16 DIAGNOSIS — E871 Hypo-osmolality and hyponatremia: Secondary | ICD-10-CM | POA: Diagnosis not present

## 2020-11-16 DIAGNOSIS — D72829 Elevated white blood cell count, unspecified: Secondary | ICD-10-CM | POA: Diagnosis present

## 2020-11-16 DIAGNOSIS — E039 Hypothyroidism, unspecified: Secondary | ICD-10-CM | POA: Diagnosis not present

## 2020-11-16 DIAGNOSIS — R451 Restlessness and agitation: Secondary | ICD-10-CM | POA: Diagnosis not present

## 2020-11-16 DIAGNOSIS — S72331A Displaced oblique fracture of shaft of right femur, initial encounter for closed fracture: Secondary | ICD-10-CM | POA: Diagnosis not present

## 2020-11-16 DIAGNOSIS — S7291XA Unspecified fracture of right femur, initial encounter for closed fracture: Secondary | ICD-10-CM

## 2020-11-16 DIAGNOSIS — R17 Unspecified jaundice: Secondary | ICD-10-CM | POA: Diagnosis not present

## 2020-11-16 DIAGNOSIS — D649 Anemia, unspecified: Secondary | ICD-10-CM | POA: Diagnosis not present

## 2020-11-16 DIAGNOSIS — D75839 Thrombocytosis, unspecified: Secondary | ICD-10-CM | POA: Diagnosis not present

## 2020-11-16 DIAGNOSIS — R4182 Altered mental status, unspecified: Secondary | ICD-10-CM | POA: Diagnosis not present

## 2020-11-16 DIAGNOSIS — R410 Disorientation, unspecified: Secondary | ICD-10-CM | POA: Diagnosis not present

## 2020-11-16 DIAGNOSIS — E890 Postprocedural hypothyroidism: Secondary | ICD-10-CM | POA: Diagnosis not present

## 2020-11-16 DIAGNOSIS — Z4789 Encounter for other orthopedic aftercare: Secondary | ICD-10-CM | POA: Diagnosis not present

## 2020-11-16 DIAGNOSIS — M48062 Spinal stenosis, lumbar region with neurogenic claudication: Secondary | ICD-10-CM | POA: Diagnosis not present

## 2020-11-16 DIAGNOSIS — W1830XA Fall on same level, unspecified, initial encounter: Secondary | ICD-10-CM | POA: Diagnosis present

## 2020-11-16 DIAGNOSIS — K5900 Constipation, unspecified: Secondary | ICD-10-CM | POA: Diagnosis not present

## 2020-11-16 DIAGNOSIS — Z66 Do not resuscitate: Secondary | ICD-10-CM | POA: Diagnosis not present

## 2020-11-16 DIAGNOSIS — M9711XA Periprosthetic fracture around internal prosthetic right knee joint, initial encounter: Secondary | ICD-10-CM | POA: Diagnosis not present

## 2020-11-16 DIAGNOSIS — Z01818 Encounter for other preprocedural examination: Secondary | ICD-10-CM | POA: Diagnosis not present

## 2020-11-16 DIAGNOSIS — Z7401 Bed confinement status: Secondary | ICD-10-CM | POA: Diagnosis not present

## 2020-11-16 DIAGNOSIS — Z20822 Contact with and (suspected) exposure to covid-19: Secondary | ICD-10-CM | POA: Diagnosis not present

## 2020-11-16 DIAGNOSIS — W19XXXD Unspecified fall, subsequent encounter: Secondary | ICD-10-CM | POA: Diagnosis not present

## 2020-11-16 DIAGNOSIS — D638 Anemia in other chronic diseases classified elsewhere: Secondary | ICD-10-CM | POA: Diagnosis not present

## 2020-11-16 DIAGNOSIS — S72301A Unspecified fracture of shaft of right femur, initial encounter for closed fracture: Secondary | ICD-10-CM | POA: Diagnosis not present

## 2020-11-16 DIAGNOSIS — S79929A Unspecified injury of unspecified thigh, initial encounter: Secondary | ICD-10-CM | POA: Diagnosis not present

## 2020-11-16 DIAGNOSIS — R41841 Cognitive communication deficit: Secondary | ICD-10-CM | POA: Diagnosis not present

## 2020-11-16 DIAGNOSIS — E034 Atrophy of thyroid (acquired): Secondary | ICD-10-CM | POA: Diagnosis not present

## 2020-11-16 DIAGNOSIS — D500 Iron deficiency anemia secondary to blood loss (chronic): Secondary | ICD-10-CM | POA: Diagnosis not present

## 2020-11-16 DIAGNOSIS — M25551 Pain in right hip: Secondary | ICD-10-CM | POA: Diagnosis not present

## 2020-11-16 DIAGNOSIS — J449 Chronic obstructive pulmonary disease, unspecified: Secondary | ICD-10-CM | POA: Diagnosis not present

## 2020-11-16 DIAGNOSIS — I4891 Unspecified atrial fibrillation: Secondary | ICD-10-CM | POA: Diagnosis not present

## 2020-11-16 DIAGNOSIS — F419 Anxiety disorder, unspecified: Secondary | ICD-10-CM | POA: Diagnosis not present

## 2020-11-16 DIAGNOSIS — J301 Allergic rhinitis due to pollen: Secondary | ICD-10-CM | POA: Diagnosis present

## 2020-11-16 DIAGNOSIS — F329 Major depressive disorder, single episode, unspecified: Secondary | ICD-10-CM | POA: Diagnosis not present

## 2020-11-16 DIAGNOSIS — S7290XA Unspecified fracture of unspecified femur, initial encounter for closed fracture: Secondary | ICD-10-CM

## 2020-11-16 DIAGNOSIS — S72401A Unspecified fracture of lower end of right femur, initial encounter for closed fracture: Secondary | ICD-10-CM | POA: Diagnosis not present

## 2020-11-16 DIAGNOSIS — Z96651 Presence of right artificial knee joint: Secondary | ICD-10-CM | POA: Diagnosis present

## 2020-11-16 DIAGNOSIS — M1990 Unspecified osteoarthritis, unspecified site: Secondary | ICD-10-CM | POA: Diagnosis present

## 2020-11-16 DIAGNOSIS — Z4889 Encounter for other specified surgical aftercare: Secondary | ICD-10-CM | POA: Diagnosis not present

## 2020-11-16 DIAGNOSIS — M9701XA Periprosthetic fracture around internal prosthetic right hip joint, initial encounter: Secondary | ICD-10-CM | POA: Diagnosis not present

## 2020-11-16 DIAGNOSIS — I517 Cardiomegaly: Secondary | ICD-10-CM | POA: Diagnosis not present

## 2020-11-16 DIAGNOSIS — I4821 Permanent atrial fibrillation: Secondary | ICD-10-CM | POA: Diagnosis not present

## 2020-11-16 DIAGNOSIS — S72009A Fracture of unspecified part of neck of unspecified femur, initial encounter for closed fracture: Secondary | ICD-10-CM | POA: Diagnosis not present

## 2020-11-16 DIAGNOSIS — M6281 Muscle weakness (generalized): Secondary | ICD-10-CM | POA: Diagnosis not present

## 2020-11-16 DIAGNOSIS — D62 Acute posthemorrhagic anemia: Secondary | ICD-10-CM | POA: Diagnosis not present

## 2020-11-16 DIAGNOSIS — Z96641 Presence of right artificial hip joint: Secondary | ICD-10-CM | POA: Diagnosis present

## 2020-11-16 DIAGNOSIS — N1831 Chronic kidney disease, stage 3a: Secondary | ICD-10-CM | POA: Diagnosis not present

## 2020-11-16 DIAGNOSIS — S72301D Unspecified fracture of shaft of right femur, subsequent encounter for closed fracture with routine healing: Secondary | ICD-10-CM | POA: Diagnosis not present

## 2020-11-16 DIAGNOSIS — S72401D Unspecified fracture of lower end of right femur, subsequent encounter for closed fracture with routine healing: Secondary | ICD-10-CM | POA: Diagnosis not present

## 2020-11-16 DIAGNOSIS — W19XXXA Unspecified fall, initial encounter: Secondary | ICD-10-CM | POA: Diagnosis not present

## 2020-11-16 LAB — CBC WITH DIFFERENTIAL/PLATELET
Abs Immature Granulocytes: 0.04 10*3/uL (ref 0.00–0.07)
Basophils Absolute: 0.1 10*3/uL (ref 0.0–0.1)
Basophils Relative: 1 %
Eosinophils Absolute: 0.2 10*3/uL (ref 0.0–0.5)
Eosinophils Relative: 2 %
HCT: 40.8 % (ref 36.0–46.0)
Hemoglobin: 13.1 g/dL (ref 12.0–15.0)
Immature Granulocytes: 1 %
Lymphocytes Relative: 14 %
Lymphs Abs: 1.1 10*3/uL (ref 0.7–4.0)
MCH: 30 pg (ref 26.0–34.0)
MCHC: 32.1 g/dL (ref 30.0–36.0)
MCV: 93.6 fL (ref 80.0–100.0)
Monocytes Absolute: 0.8 10*3/uL (ref 0.1–1.0)
Monocytes Relative: 11 %
Neutro Abs: 5.6 10*3/uL (ref 1.7–7.7)
Neutrophils Relative %: 71 %
Platelets: 316 10*3/uL (ref 150–400)
RBC: 4.36 MIL/uL (ref 3.87–5.11)
RDW: 13.5 % (ref 11.5–15.5)
WBC: 7.8 10*3/uL (ref 4.0–10.5)
nRBC: 0 % (ref 0.0–0.2)

## 2020-11-16 LAB — RESP PANEL BY RT-PCR (FLU A&B, COVID) ARPGX2
Influenza A by PCR: NEGATIVE
Influenza B by PCR: NEGATIVE
SARS Coronavirus 2 by RT PCR: NEGATIVE

## 2020-11-16 LAB — SURGICAL PCR SCREEN
MRSA, PCR: NEGATIVE
Staphylococcus aureus: NEGATIVE

## 2020-11-16 LAB — BASIC METABOLIC PANEL
Anion gap: 8 (ref 5–15)
BUN: 21 mg/dL (ref 8–23)
CO2: 26 mmol/L (ref 22–32)
Calcium: 9.5 mg/dL (ref 8.9–10.3)
Chloride: 100 mmol/L (ref 98–111)
Creatinine, Ser: 0.95 mg/dL (ref 0.44–1.00)
GFR, Estimated: 56 mL/min — ABNORMAL LOW (ref >60)
Glucose, Bld: 136 mg/dL — ABNORMAL HIGH (ref 70–99)
Potassium: 4.2 mmol/L (ref 3.5–5.1)
Sodium: 134 mmol/L — ABNORMAL LOW (ref 135–145)

## 2020-11-16 LAB — PROTIME-INR
INR: 1.6 — ABNORMAL HIGH (ref 0.8–1.2)
Prothrombin Time: 18.9 seconds — ABNORMAL HIGH (ref 11.4–15.2)

## 2020-11-16 MED ORDER — MORPHINE SULFATE (PF) 2 MG/ML IV SOLN
0.5000 mg | INTRAVENOUS | Status: DC | PRN
  Administered 2020-11-16 (×2): 0.5 mg via INTRAVENOUS
  Filled 2020-11-16: qty 1

## 2020-11-16 MED ORDER — MORPHINE SULFATE (PF) 2 MG/ML IV SOLN
0.5000 mg | INTRAVENOUS | Status: DC | PRN
  Filled 2020-11-16: qty 1

## 2020-11-16 MED ORDER — ENSURE ENLIVE PO LIQD
237.0000 mL | Freq: Two times a day (BID) | ORAL | Status: DC
  Administered 2020-11-16: 237 mL via ORAL

## 2020-11-16 MED ORDER — METOPROLOL SUCCINATE ER 25 MG PO TB24
25.0000 mg | ORAL_TABLET | Freq: Two times a day (BID) | ORAL | Status: DC
  Administered 2020-11-16 – 2020-11-20 (×10): 25 mg via ORAL
  Filled 2020-11-16 (×10): qty 1

## 2020-11-16 MED ORDER — FENTANYL CITRATE (PF) 100 MCG/2ML IJ SOLN
25.0000 ug | Freq: Once | INTRAMUSCULAR | Status: AC
  Administered 2020-11-16: 25 ug via INTRAVENOUS
  Filled 2020-11-16: qty 2

## 2020-11-16 MED ORDER — MUPIROCIN 2 % EX OINT: 1.0000 "application " | TOPICAL_OINTMENT | Freq: Two times a day (BID) | CUTANEOUS | Status: DC

## 2020-11-16 MED ORDER — SENNA 8.6 MG PO TABS
1.0000 | ORAL_TABLET | Freq: Two times a day (BID) | ORAL | Status: DC
  Administered 2020-11-16 – 2020-11-24 (×13): 8.6 mg via ORAL
  Filled 2020-11-16 (×13): qty 1

## 2020-11-16 MED ORDER — METHOCARBAMOL 500 MG PO TABS
500.0000 mg | ORAL_TABLET | Freq: Four times a day (QID) | ORAL | Status: DC | PRN
  Administered 2020-11-16: 500 mg via ORAL
  Filled 2020-11-16: qty 1

## 2020-11-16 MED ORDER — LEVOTHYROXINE SODIUM 25 MCG PO TABS
125.0000 ug | ORAL_TABLET | Freq: Every day | ORAL | Status: DC
  Administered 2020-11-16 – 2020-11-22 (×5): 125 ug via ORAL
  Filled 2020-11-16 (×6): qty 1

## 2020-11-16 MED ORDER — MIRTAZAPINE 15 MG PO TABS
7.5000 mg | ORAL_TABLET | Freq: Every day | ORAL | Status: DC
  Administered 2020-11-16 – 2020-11-23 (×8): 7.5 mg via ORAL
  Filled 2020-11-16 (×8): qty 1

## 2020-11-16 MED ORDER — HYDROCODONE-ACETAMINOPHEN 5-325 MG PO TABS
1.0000 | ORAL_TABLET | ORAL | Status: DC | PRN
  Administered 2020-11-16: 2 via ORAL
  Filled 2020-11-16: qty 2

## 2020-11-16 MED ORDER — METHOCARBAMOL 1000 MG/10ML IJ SOLN: 500.0000 mg | Freq: Four times a day (QID) | INTRAVENOUS | Status: DC | PRN

## 2020-11-16 MED ORDER — FENTANYL CITRATE (PF) 100 MCG/2ML IJ SOLN
50.0000 ug | Freq: Once | INTRAMUSCULAR | Status: AC
  Administered 2020-11-16: 50 ug via INTRAVENOUS
  Filled 2020-11-16: qty 2

## 2020-11-16 MED ORDER — DILTIAZEM HCL ER COATED BEADS 120 MG PO CP24
120.0000 mg | ORAL_CAPSULE | Freq: Two times a day (BID) | ORAL | Status: DC
  Administered 2020-11-16 – 2020-11-24 (×17): 120 mg via ORAL
  Filled 2020-11-16 (×21): qty 1

## 2020-11-16 NOTE — Progress Notes (Signed)
Patient ID: Katrina Velez, female   DOB: 10-14-1925, 87 y.o.   MRN: [id number] This is a very nice long-term patient of mine who fell earlier today.  She suffered a femur fracture between a total hip and total knee both of which look well fixed.  She unfortunately is on Eliquis so there has to be some delay prior to her surgery.  I have spoken with Dr. Jena Gauss who is nice enough to take her on for Thursday if the schedule will allow.  He is our trauma surgeon and if for some reason his schedule gets overwhelmed I will be happy to be involved in her care.  She is going to be evaluated and admitted by the Orthopedictrauma service for now. Jodi Geralds

## 2020-11-16 NOTE — ED Notes (Signed)
Dr. Rathore at bedside 

## 2020-11-16 NOTE — ED Notes (Signed)
Pt's 02 sat began dropping as the of Fentanyl took affect.  Pt was roused and placed on 2LNC and rebounded.  Pt is not resting and maintaining 02 sat of 99 on 2L.

## 2020-11-16 NOTE — TOC CAGE-AID Note (Signed)
Transition of Care [name]) - CAGE-AID Screening   Patient Details  Name: Katrina Velez MRN: [id number] Date of Birth: 18-Oct-1925  Transition of Care [facility]) CM/SW Contact:    Katha Hamming, RN Phone Number:[phone] 11/16/2020, 8:57 PM   Clinical Narrative:  Ground level fall with right femur fracture. Reports no drug use, 1-2 alcoholic drinks per week. Declines resources.   CAGE-AID Screening:    Have You Ever Felt You Ought to Cut Down on Your Drinking or Drug Use?: No Have People Annoyed You By Critizing Your Drinking Or Drug Use?: No Have You Felt Bad Or Guilty About Your Drinking Or Drug Use?: No Have You Ever Had a Drink or Used Drugs First Thing In The Morning to Steady Your Nerves or to Get Rid of a Hangover?: No CAGE-AID Score: 0  Substance Abuse Education Offered: No (one-two alcoholic drinks per week, denies drug use, declines resources)

## 2020-11-16 NOTE — H&P (Signed)
History and Physical    Katrina Velez IZT:[id number] DOB: 1925-06-13 DOA: 11/15/2020  PCP: System, Provider Not In Patient coming from: Home  Chief Complaint: Fall  HPI: Katrina Velez is a 87 y.o. female with medical history significant of permanent A. fib on Eliquis, hypothyroidism, osteoarthritis, depression, spinal stenosis of lumbar region with neurogenic claudication presented to the ED via EMS complaining of right thigh pain after a mechanical fall.  No significant lab abnormalities.  Screening COVID test negative.  X-ray showing mild right femoral shaft fracture with overlapping angulated and displaced fragments.  Chest x-ray showing no active disease. ED PA spoke to on-call provider for [facility] orthopedics, will consult in a.m.  Patient states she was walking from her bedroom to her kitchen to take her evening medications and somehow slipped and slid down the wall onto the floor.  Since then she is having a lot of pain in her right thigh.  Denies hitting her head or loss of consciousness.  She did take Eliquis around 10 PM this evening.  She has no other complaints.  Denies lightheadedness/dizziness, chest pain, shortness of breath, cough, nausea, vomiting, abdominal pain, diarrhea, or dysuria.  Daughter at bedside states confirms that the patient was in her usual state of health until this evening.  She had dinner with family and was not having any problems prior to this fall.  Review of Systems:  All systems reviewed and apart from history of presenting illness, are negative.  Past Medical History:  Diagnosis Date   A-fib (HCC) 05/2015   HOSPITALIZED    Arthritis    Cataract    Hypothyroidism    Permanent atrial fibrillation (HCC)    Thyroid disease     Past Surgical History:  Procedure Laterality Date   CARDIOVERSION N/A 06/08/2015   Procedure: CARDIOVERSION;  Surgeon: Jake Bathe, MD;  Location: [facility] ENDOSCOPY;  Service: Cardiovascular;  Laterality: N/A;   CARDIOVERSION N/A  09/02/2015   Procedure: CARDIOVERSION;  Surgeon: Laqueta Linden, MD;  Location: [facility] ENDOSCOPY;  Service: Cardiovascular;  Laterality: N/A;   CATARACT EXTRACTION, BILATERAL Bilateral 2010   JOINT REPLACEMENT     KNEE SURGERY  2002   right hip replacement     RIGHT KNEE REPLACEMENT  2011   TEE WITHOUT CARDIOVERSION N/A 06/08/2015   Procedure: TRANSESOPHAGEAL ECHOCARDIOGRAM (TEE);  Surgeon: Jake Bathe, MD;  Location: [facility] ENDOSCOPY;  Service: Cardiovascular;  Laterality: N/A;   TOTAL HIP ARTHROPLASTY Right 2011   TOTAL SHOULDER REPLACEMENT Right 2003   TOTAL SHOULDER REPLACEMENT Left 2007   WRIST SURGERY Bilateral 2008   both wrists     reports that she quit smoking about 9 years ago. Her smoking use included cigarettes. She has a 65.00 pack-year smoking history. She has never used smokeless tobacco. She reports current alcohol use of about 1.0 - 2.0 standard drink of alcohol per week. She reports that she does not use drugs.  Allergies  Allergen Reactions   Banana Nausea Only    All banana products also   Penicillins Hives    Family History  Problem Relation Age of Onset   Hypercalcemia Daughter    Hypertension Daughter     Prior to Admission medications   Medication Sig Start Date End Date Taking? Authorizing Provider  CARDIZEM LA 120 MG 24 hr tablet TAKE 1 TABLET (120 MG TOTAL) BY MOUTH 2 (TWO) TIMES DAILY. Patient taking differently: Take 120 mg by mouth in the morning and at bedtime. 08/16/20  Yes Newman Nip,  NP  ELIQUIS 5 MG TABS tablet TAKE 1 TABLET BY MOUTH TWICE A DAY Patient taking differently: Take 5 mg by mouth 2 (two) times daily. 05/20/20  Yes Newman Nip, NP  furosemide (LASIX) 20 MG tablet TAKE 1 TABLET DAILY AS NEEDED FOR WEIGHT GAIN >3LBS. Patient taking differently: Take 20 mg by mouth as needed. Weight gain > 3 pounds 11/07/18  Yes Newman Nip, NP  levothyroxine (SYNTHROID) 125 MCG tablet Take 1 tablet (125 mcg total) by mouth daily before  breakfast. 05/20/20  Yes Reed, Tiffany L, DO  metoprolol succinate (TOPROL-XL) 25 MG 24 hr tablet TAKE 1 TABLET BY MOUTH TWICE A DAY Patient taking differently: Take 25 mg by mouth in the morning and at bedtime. Take 1 tablet by mouth twice a day 08/16/20  Yes Newman Nip, NP  mirtazapine (REMERON) 7.5 MG tablet Take 1 tablet (7.5 mg total) by mouth at bedtime. 05/20/20  Yes Reed, Tiffany L, DO  traMADol (ULTRAM) 50 MG tablet Take 1 tablet (50 mg total) by mouth 2 (two) times daily as needed for moderate pain or severe pain. 05/20/20  Yes Reed, Tiffany L, DO  Triprolidine-Pseudoephedrine (ANTIHISTAMINE PO) Take 1 tablet by mouth daily as needed (runny nose).   Yes [provider]  docusate sodium (COLACE) 100 MG capsule Take 1 capsule (100 mg total) by mouth daily as needed for mild constipation. 11/12/19   Ngetich, Donalee Citrin, NP    Physical Exam: Vitals:   11/16/20 0330 11/16/20 0345 11/16/20 0400 11/16/20 0404  BP: 127/72  (!) 148/93   Pulse: 90 87 89   Resp: (!) 26 19 17    Temp:    (!) 97.4 F (36.3 C)  TempSrc:    Oral  SpO2: 99% 98% 99%     Physical Exam Constitutional:      General: She is not in acute distress. HENT:     Head: Normocephalic and atraumatic.  Eyes:     Extraocular Movements: Extraocular movements intact.     Conjunctiva/sclera: Conjunctivae normal.  Cardiovascular:     Rate and Rhythm: Normal rate. Rhythm irregular.     Pulses: Normal pulses.  Pulmonary:     Effort: Pulmonary effort is normal. No respiratory distress.     Breath sounds: Normal breath sounds. No wheezing or rales.  Abdominal:     General: Bowel sounds are normal. There is no distension.     Palpations: Abdomen is soft.     Tenderness: There is no abdominal tenderness.  Musculoskeletal:        General: No swelling or tenderness.     Cervical back: Normal range of motion and neck supple.     Comments: Right lower extremity: Dorsalis pedis pulse intact.  Sensation intact.   Skin:    General: Skin is warm and dry.  Neurological:     General: No focal deficit present.     Mental Status: She is alert and oriented to person, place, and time.     Labs on Admission: I have personally reviewed following labs and imaging studies  CBC: Recent Labs  Lab 11/16/20 0005  WBC 7.8  NEUTROABS 5.6  HGB 13.1  HCT 40.8  MCV 93.6  PLT 316   Basic Metabolic Panel: Recent Labs  Lab 11/16/20 0005  NA 134*  K 4.2  CL 100  CO2 26  GLUCOSE 136*  BUN 21  CREATININE 0.95  CALCIUM 9.5   GFR: CrCl cannot be calculated (Unknown ideal weight.). Liver Function  Tests: No results for input(s): AST, ALT, ALKPHOS, BILITOT, PROT, ALBUMIN in the last 168 hours. No results for input(s): LIPASE, AMYLASE in the last 168 hours. No results for input(s): AMMONIA in the last 168 hours. Coagulation Profile: Recent Labs  Lab 11/16/20 0005  INR 1.6*   Cardiac Enzymes: No results for input(s): CKTOTAL, CKMB, CKMBINDEX, TROPONINI in the last 168 hours. BNP (last 3 results) No results for input(s): PROBNP in the last 8760 hours. HbA1C: No results for input(s): HGBA1C in the last 72 hours. CBG: No results for input(s): GLUCAP in the last 168 hours. Lipid Profile: No results for input(s): CHOL, HDL, LDLCALC, TRIG, CHOLHDL, LDLDIRECT in the last 72 hours. Thyroid Function Tests: No results for input(s): TSH, T4TOTAL, FREET4, T3FREE, THYROIDAB in the last 72 hours. Anemia Panel: No results for input(s): VITAMINB12, FOLATE, FERRITIN, TIBC, IRON, RETICCTPCT in the last 72 hours. Urine analysis:    Component Value Date/Time   COLORURINE YELLOW 10/04/2016 1124   APPEARANCEUR CLEAR 10/04/2016 1124   LABSPEC 1.019 10/04/2016 1124   PHURINE 6.0 10/04/2016 1124   GLUCOSEU NEGATIVE 10/04/2016 1124   HGBUR NEGATIVE 10/04/2016 1124   BILIRUBINUR NEGATIVE 10/04/2016 1124   BILIRUBINUR neg 05/27/2014 1050   KETONESUR NEGATIVE 10/04/2016 1124   PROTEINUR NEGATIVE 10/04/2016 1124    UROBILINOGEN 0.2 05/27/2014 1050   UROBILINOGEN 0.2 12/30/2009 1211   NITRITE NEGATIVE 10/04/2016 1124   LEUKOCYTESUR 2+ (A) 10/04/2016 1124    Radiological Exams on Admission: DG Pelvis 1-2 Views  Result Date: 11/16/2020 CLINICAL DATA:  Fall, preop EXAM: PELVIS - 1-2 VIEW COMPARISON:  None. FINDINGS: Right hip replacement. There is a right mid femoral fracture image below the femoral stem. This is angulated. Joint space narrowing and spurring in the left hip. Calcified fibroid in the mid pelvis. IMPRESSION: Angulated mid right femoral fracture below the hip replacement stem. Electronically Signed   By: Charlett NoseKevin  Dover M.D.   On: 11/16/2020 00:59   DG Chest Port 1 View  Result Date: 11/16/2020 CLINICAL DATA:  Fall, preop EXAM: PORTABLE CHEST 1 VIEW COMPARISON:  06/05/2015 FINDINGS: Cardiomegaly. Aortic atherosclerosis. Lungs clear. No effusions or edema. No acute bony abnormality. IMPRESSION: Cardiomegaly.  No active disease. Electronically Signed   By: Charlett NoseKevin  Dover M.D.   On: 11/16/2020 00:57   DG Femur Min 2 Views Right  Result Date: 11/16/2020 CLINICAL DATA:  Fall EXAM: RIGHT FEMUR 2 VIEWS COMPARISON:  None. FINDINGS: There is a right hip replacement. Mid right femoral fracture is noted below the femoral stem with angulation, displacement and overlapping fragments. No subluxation or dislocation. IMPRESSION: Mid right femoral shaft fracture with overlapping, angulated and displaced fragments. Electronically Signed   By: Charlett NoseKevin  Dover M.D.   On: 11/16/2020 01:00    EKG: Independently reviewed.  A. fib.  Assessment/Plan Principal Problem:   Femur fracture (HCC) Active Problems:   Hypothyroidism post removal of goiter   Atrial fibrillation (HCC)   Right femur fracture secondary to a mechanical fall X-ray showing mild right femoral shaft fracture with overlapping angulated and displaced fragments. -Morphine as needed for pain, Robaxin as needed for muscle spasms.  Hold home Eliquis.   Orthopedics will consult in the morning.  Keep n.p.o.  Permanent A. fib  Currently rate controlled. -Hold home Eliquis.  Continue Cardizem and metoprolol.  Hypothyroidism -Continue Synthroid  DVT prophylaxis: SCDs Code Status: Patient wishes to be DNR. Family Communication: Daughter at bedside. Disposition Plan: Status is: Inpatient  Remains inpatient appropriate because:Inpatient level of care appropriate due  to severity of illness and needs surgery for femur fracture.  Dispo: The patient is from: Home              Anticipated d/c is to: Home              Patient currently is not medically stable to d/c.   Difficult to place patient No  Level of care: Level of care: Telemetry Medical  The medical decision making on this patient was of high complexity and the patient is at high risk for clinical deterioration, therefore this is a level 3 visit.  John Giovanni MD [facility]  If 7PM-7AM, please contact night-coverage [email or web address]  11/16/2020, 4:15 AM

## 2020-11-16 NOTE — ED Notes (Signed)
AC contacted this RN and let her know that pt cannot go until after 7. Going to see if pt can be reassigned after 7

## 2020-11-16 NOTE — ED Notes (Signed)
Attempted to call report x 1  

## 2020-11-16 NOTE — TOC Initial Note (Signed)
Transition of Care [facility]) - Initial/Assessment Note    Patient Details  Name: Katrina Velez MRN: [id number] Date of Birth: 1925/05/31  Transition of Care [facility]) CM/SW Contact:    Epifanio Lesches, RN Phone Number: 11/16/2020, 3:52 PM  Clinical Narrative:        Presented with R hip fx after falling. Hx of A. fib on Eliquis, hypothyroidism, osteoarthritis, depression, spinal stenosis. From home alone . Daughter lives close by, across the street.  Supportive family.   PTA independent with ADL's, no DME usage. Owns cane , walker.       Plan: surgery ORIF ,Thursday per Dr. Jena Gauss  [facility] team following and will assist with needs.....  Expected Discharge Plan: Skilled Nursing Facility Barriers to Discharge: Continued Medical Work up   Patient Goals and CMS Choice        Expected Discharge Plan and Services Expected Discharge Plan: Skilled Nursing Facility                                              Prior Living Arrangements/Services                       Activities of Daily Living Home Assistive Devices/Equipment: Shower chair with back, Medical laboratory scientific officer (specify quad or straight), Walker (specify type) ADL Screening (condition at time of admission) Patient's cognitive ability adequate to safely complete daily activities?: Yes Is the patient deaf or have difficulty hearing?: Yes Does the patient have difficulty seeing, even when wearing glasses/contacts?: Yes Does the patient have difficulty concentrating, remembering, or making decisions?: Yes Patient able to express need for assistance with ADLs?: Yes Does the patient have difficulty dressing or bathing?: No Independently performs ADLs?: Yes (appropriate for developmental age) Does the patient have difficulty walking or climbing stairs?: Yes Weakness of Legs: Both Weakness of Arms/Hands: Both  Permission Sought/Granted                  Emotional Assessment              Admission diagnosis:  Hip  fracture (HCC) [S72.009A] Fall [W19.XXXA] Pre-op evaluation [Z01.818] Fall, initial encounter [id number]] Closed fracture of shaft of right femur, unspecified fracture morphology, initial encounter (HCC) [S72.301A] Patient Active Problem List   Diagnosis Date Noted   Femur fracture (HCC) 11/16/2020   Spinal stenosis of lumbar region with neurogenic claudication 05/11/2017   Seasonal allergic rhinitis due to pollen 10/04/2016   High risk medication use 10/04/2016   Osteoarthritis 03/01/2016   Iatrogenic hyperthyroidism 03/01/2016   Atrial fibrillation (HCC) 06/05/2015   Nonspecific abnormal electrocardiogram (ECG) (EKG) 05/27/2014   Elevated glucose 03/26/2012   Hypothyroidism post removal of goiter 01/31/2012   Nicotine addiction 01/31/2012   Inflammatory arthritis 01/31/2012   PCP:  System, Provider Not In Pharmacy:   [id number] - [place], [place] - [address] [place] [address] Phone: [phone] Fax: [phone]     Social Determinants of Health (SDOH) Interventions    Readmission Risk Interventions No flowsheet data found.

## 2020-11-16 NOTE — Plan of Care (Signed)

## 2020-11-16 NOTE — ED Notes (Signed)
This RN called charge for [facility] and was told that they did not have enough nurses to take this patient. Charge RN notified and [facility] contacted. AC told this RN they would contact this RN back.

## 2020-11-16 NOTE — ED Provider Notes (Signed)
[facility] EMERGENCY DEPARTMENT Provider Note   CSN: [id number] Arrival date & time: 11/15/20  2352     History Chief Complaint  Patient presents with   Katrina Velez is a 87 y.o. female.  87 year old female presents to the emergency department for evaluation by EMS.  She had a mechanical fall in her kitchen.  Reports falling back towards the wall and sliding down the wall onto the floor.  She is complaining of pain to her right thigh.  This has been constant.  Aggravated with movement and rated a 5/10 at rest.  She took a tramadol at 2200 tonight.  Did not receive any medications in transport.  Denies hitting her head or loss of consciousness.  No lightheadedness, dizziness, chest pain, shortness of breath associated with her fall.  Further denies headache, back pain, extremity numbness or paresthesias.  She is chronically anticoagulated on Eliquis for atrial fibrillation.  Uses a "stick" when ambulating at baseline.  Orthopedist - Dr. Luiz Blare   Fall      Past Medical History:  Diagnosis Date   A-fib [facility]) 05/2015   HOSPITALIZED    Arthritis    Cataract    Hypothyroidism    Permanent atrial fibrillation [facility])    Thyroid disease     Patient Active Problem List   Diagnosis Date Noted   Hip fracture (HCC) 11/16/2020   Spinal stenosis of lumbar region with neurogenic claudication 05/11/2017   Seasonal allergic rhinitis due to pollen 10/04/2016   High risk medication use 10/04/2016   Osteoarthritis 03/01/2016   Iatrogenic hyperthyroidism 03/01/2016   Atrial fibrillation (HCC) 06/05/2015   Nonspecific abnormal electrocardiogram (ECG) (EKG) 05/27/2014   Elevated glucose 03/26/2012   Hypothyroidism post removal of goiter 01/31/2012   Nicotine addiction 01/31/2012   Inflammatory arthritis 01/31/2012    Past Surgical History:  Procedure Laterality Date   CARDIOVERSION N/A 06/08/2015   Procedure: CARDIOVERSION;  Surgeon: Jake Bathe, MD;   Location: [facility] ENDOSCOPY;  Service: Cardiovascular;  Laterality: N/A;   CARDIOVERSION N/A 09/02/2015   Procedure: CARDIOVERSION;  Surgeon: Laqueta Linden, MD;  Location: [facility] ENDOSCOPY;  Service: Cardiovascular;  Laterality: N/A;   CATARACT EXTRACTION, BILATERAL Bilateral 2010   JOINT REPLACEMENT     KNEE SURGERY  2002   right hip replacement     RIGHT KNEE REPLACEMENT  2011   TEE WITHOUT CARDIOVERSION N/A 06/08/2015   Procedure: TRANSESOPHAGEAL ECHOCARDIOGRAM (TEE);  Surgeon: Jake Bathe, MD;  Location: [facility] ENDOSCOPY;  Service: Cardiovascular;  Laterality: N/A;   TOTAL HIP ARTHROPLASTY Right 2011   TOTAL SHOULDER REPLACEMENT Right 2003   TOTAL SHOULDER REPLACEMENT Left 2007   WRIST SURGERY Bilateral 2008   both wrists     OB History   No obstetric history on file.     Family History  Problem Relation Age of Onset   Hypercalcemia Daughter    Hypertension Daughter     Social History   Tobacco Use   Smoking status: Former    Packs/day: 1.00    Years: 65.00    Pack years: 65.00    Types: Cigarettes    Quit date: 05/28/2011    Years since quitting: 9.4   Smokeless tobacco: Never  Vaping Use   Vaping Use: Former  Substance Use Topics   Alcohol use: Yes    Alcohol/week: 1.0 - 2.0 standard drink    Types: 1 - 2 Glasses of wine per week    Comment: occasional drink  Drug use: No    Home Medications Prior to Admission medications   Medication Sig Start Date End Date Taking? Authorizing Provider  CARDIZEM LA 120 MG 24 hr tablet TAKE 1 TABLET (120 MG TOTAL) BY MOUTH 2 (TWO) TIMES DAILY. Patient taking differently: Take 120 mg by mouth in the morning and at bedtime. 08/16/20  Yes Newman Niparroll, Donna C, NP  ELIQUIS 5 MG TABS tablet TAKE 1 TABLET BY MOUTH TWICE A DAY Patient taking differently: Take 5 mg by mouth 2 (two) times daily. 05/20/20  Yes Newman Niparroll, Donna C, NP  furosemide (LASIX) 20 MG tablet TAKE 1 TABLET DAILY AS NEEDED FOR WEIGHT GAIN >3LBS. Patient taking differently:  Take 20 mg by mouth as needed. Weight gain > 3 pounds 11/07/18  Yes Newman Niparroll, Donna C, NP  levothyroxine (SYNTHROID) 125 MCG tablet Take 1 tablet (125 mcg total) by mouth daily before breakfast. 05/20/20  Yes Reed, Tiffany L, DO  metoprolol succinate (TOPROL-XL) 25 MG 24 hr tablet TAKE 1 TABLET BY MOUTH TWICE A DAY Patient taking differently: Take 25 mg by mouth in the morning and at bedtime. Take 1 tablet by mouth twice a day 08/16/20  Yes Newman Niparroll, Donna C, NP  mirtazapine (REMERON) 7.5 MG tablet Take 1 tablet (7.5 mg total) by mouth at bedtime. 05/20/20  Yes Reed, Tiffany L, DO  traMADol (ULTRAM) 50 MG tablet Take 1 tablet (50 mg total) by mouth 2 (two) times daily as needed for moderate pain or severe pain. 05/20/20  Yes Reed, Tiffany L, DO  Triprolidine-Pseudoephedrine (ANTIHISTAMINE PO) Take 1 tablet by mouth daily as needed (runny nose).   Yes [provider]  docusate sodium (COLACE) 100 MG capsule Take 1 capsule (100 mg total) by mouth daily as needed for mild constipation. 11/12/19   Ngetich, Dinah C, NP    Allergies    Banana and Penicillins  Review of Systems   Review of Systems Ten systems reviewed and are negative for acute change, except as noted in the HPI.    Physical Exam Updated Vital Signs BP 138/73   Pulse 90   Temp 97.9 F (36.6 C) (Oral)   Resp 16   SpO2 95%   Physical Exam Vitals and nursing note reviewed.  Constitutional:      General: She is not in acute distress.    Appearance: She is well-developed. She is not diaphoretic.     Comments: Nontoxic-appearing and in no distress.  HENT:     Head: Normocephalic and atraumatic.  Eyes:     General: No scleral icterus.    Conjunctiva/sclera: Conjunctivae normal.  Cardiovascular:     Rate and Rhythm: Normal rate and regular rhythm.     Pulses: Normal pulses.     Comments: DP pulse 2+ in the RLE Pulmonary:     Effort: Pulmonary effort is normal. No respiratory distress.     Comments: Respirations even  and unlabored Musculoskeletal:        General: Tenderness and deformity present.     Cervical back: Normal range of motion.     Comments: Leg shortening and inward rotation of the right lower extremity.  There is deformity to the midshaft of the right femur without crepitus.  No large hematoma.  Also tenderness to the right hip without crepitus.  Skin:    General: Skin is warm and dry.     Coloration: Skin is not pale.     Findings: No erythema or rash.  Neurological:     Mental Status:  She is alert and oriented to person, place, and time.     Comments: Sensation to light touch intact in BLE. Able to wiggle all toes of R foot.  Psychiatric:        Behavior: Behavior normal.    ED Results / Procedures / Treatments   Labs (all labs ordered are listed, but only abnormal results are displayed) Labs Reviewed  BASIC METABOLIC PANEL - Abnormal; Notable for the following components:      Result Value   Sodium 134 (*)    Glucose, Bld 136 (*)    GFR, Estimated 56 (*)    All other components within normal limits  PROTIME-INR - Abnormal; Notable for the following components:   Prothrombin Time 18.9 (*)    INR 1.6 (*)    All other components within normal limits  RESP PANEL BY RT-PCR (FLU A&B, COVID) ARPGX2  CBC WITH DIFFERENTIAL/PLATELET    EKG None  Radiology DG Pelvis 1-2 Views  Result Date: 11/16/2020 CLINICAL DATA:  Fall, preop EXAM: PELVIS - 1-2 VIEW COMPARISON:  None. FINDINGS: Right hip replacement. There is a right mid femoral fracture image below the femoral stem. This is angulated. Joint space narrowing and spurring in the left hip. Calcified fibroid in the mid pelvis. IMPRESSION: Angulated mid right femoral fracture below the hip replacement stem. Electronically Signed   By: Charlett Nose M.D.   On: 11/16/2020 00:59   DG Chest Port 1 View  Result Date: 11/16/2020 CLINICAL DATA:  Fall, preop EXAM: PORTABLE CHEST 1 VIEW COMPARISON:  06/05/2015 FINDINGS: Cardiomegaly. Aortic  atherosclerosis. Lungs clear. No effusions or edema. No acute bony abnormality. IMPRESSION: Cardiomegaly.  No active disease. Electronically Signed   By: Charlett Nose M.D.   On: 11/16/2020 00:57   DG Femur Min 2 Views Right  Result Date: 11/16/2020 CLINICAL DATA:  Fall EXAM: RIGHT FEMUR 2 VIEWS COMPARISON:  None. FINDINGS: There is a right hip replacement. Mid right femoral fracture is noted below the femoral stem with angulation, displacement and overlapping fragments. No subluxation or dislocation. IMPRESSION: Mid right femoral shaft fracture with overlapping, angulated and displaced fragments. Electronically Signed   By: Charlett Nose M.D.   On: 11/16/2020 01:00    Procedures Procedures   Medications Ordered in ED Medications  fentaNYL (SUBLIMAZE) injection 50 mcg (has no administration in time range)  fentaNYL (SUBLIMAZE) injection 25 mcg (25 mcg Intravenous Given 11/16/20 0019)    ED Course  I have reviewed the triage vital signs and the nursing notes.  Pertinent labs & imaging results that were available during my care of the patient were reviewed by me and considered in my medical decision making (see chart for details).  Clinical Course as of 11/16/20 0202  Tue Nov 16, 2020  5784 X-ray reviewed by me.  Consistent with femoral shaft fracture below her hardware from prior hip arthroplasty.  Will require admission for orthopedic management. [KH]  0106 Consult placed to Orthopedics. [KH]  0114 Received call back from PA McKenzie of [employer]. Notified of need for AM consultation. Agree with plan to hold anticoagulation pending formal bedside consult in AM. [KH]  0201 Spoke with Dr. Loney Loh of [facility] who will admit. [KH]    Clinical Course User Index [KH] Darylene Price   MDM Rules/Calculators/A&P                          87 year old female presents to the emergency department after mechanical fall  at home.  Sustained closed, displaced and angulated right femoral shaft  fracture.  Patient to be assessed by orthopedics in the morning; followed by Dr. Luiz Blare of [facility].  Will hold Eliquis given anticipation for operative management.  TRH to admit.   Final Clinical Impression(s) / ED Diagnoses Final diagnoses:  Closed fracture of shaft of right femur, unspecified fracture morphology, initial encounter [facility])  Fall, initial encounter    Rx / DC Orders ED Discharge Orders     None        Antony Madura, Cordelia Poche 11/16/20 0203    Palumbo, April, MD 11/16/20 0206

## 2020-11-16 NOTE — ED Notes (Addendum)
Per Dr. Loney Loh  I have switched her to low-dose IV morphine. You can try giving her Robaxin first for muscle spasms to see if that helps. If she continues to have pain,then try a dose of morphine. Continue to monitor sats closely, thanks.

## 2020-11-16 NOTE — Progress Notes (Signed)
   Patient seen and examined at bedside, patient admitted after midnight, please see earlier detailed admission note by John Giovanni, MD. Briefly, patient presented secondary to fall and subsequent right hip fracture. Orthopedic surgery consulted with plan for surgery in two days  Subjective: Some leg pain. No other concerns.  BP (!) 111/57 (BP Location: Right Arm)   Pulse 88   Temp 98.3 F (36.8 C)   Resp 18   Ht 5\' 2"  (1.575 m)   Wt 65.8 kg   SpO2 96%   BMI 26.52 kg/m   General exam: Appears calm and comfortable Respiratory system: Clear to auscultation. Respiratory effort normal. Cardiovascular system: S1 & S2 heard, RRR. No murmurs, rubs, gallops or clicks. Gastrointestinal system: Abdomen is nondistended, soft and nontender. No organomegaly or masses felt. Normal bowel sounds heard. Central nervous system: Alert and oriented. No focal neurological deficits. Musculoskeletal: No calf tenderness Skin: No cyanosis. No rashes Psychiatry: Judgement and insight appear normal. Mood & affect appropriate.   Brief assessment/Plan:  Right femur fracture Secondary to fall. No concern for syncopal episode. Orthopedic surgery consulted with plan for surgery in two days. -Continue morphine prn -Add Norco prn; increase to Percocet if inadequate PO pain control  Permanent atrial fibrillation Currently in atrial fibrillation but rate is controlled. Patient is on Cardizem and metoprolol as an outpatient. She is also on Eliquis for stroke prophylaxis. -Continue Diltiazem and metoprolol -Hold Eliquis secondary to surgery  Hypothyroidism -Continue synthroid  Family communication: Son and daughter at bedside DVT prophylaxis: SCDs Disposition: Discharge likely to SNF pending orthopedic surgery/PT/OT recommendations post surgery  , MD [facility] 11/16/2020, 2:01 PM

## 2020-11-16 NOTE — H&P (View-Only) (Signed)
Reason for Consult:Right femur fx Referring Physician: John Giovanni Time called: 6546 Time at bedside: 0944   Katrina Velez is an 87 y.o. female.  HPI: Katrina Velez fell at home last night after slipping on something on the floor. She had immediate pain in her right thigh and could not get up. She was brought to the ED where x-rays showed a right periprosthetic femur fx and orthopedic surgery was consulted. Due to the complex nature of the fracture orthopedic trauma consultation was requested. She lives at home and usually ambulates with the aid of a walking stick.  Past Medical History:  Diagnosis Date   A-fib (HCC) 05/2015   HOSPITALIZED    Arthritis    Cataract    Hypothyroidism    Permanent atrial fibrillation (HCC)    Thyroid disease     Past Surgical History:  Procedure Laterality Date   CARDIOVERSION N/A 06/08/2015   Procedure: CARDIOVERSION;  Surgeon: Jake Bathe, MD;  Location: [facility] ENDOSCOPY;  Service: Cardiovascular;  Laterality: N/A;   CARDIOVERSION N/A 09/02/2015   Procedure: CARDIOVERSION;  Surgeon: Laqueta Linden, MD;  Location: [facility] ENDOSCOPY;  Service: Cardiovascular;  Laterality: N/A;   CATARACT EXTRACTION, BILATERAL Bilateral 2010   JOINT REPLACEMENT     KNEE SURGERY  2002   right hip replacement     RIGHT KNEE REPLACEMENT  2011   TEE WITHOUT CARDIOVERSION N/A 06/08/2015   Procedure: TRANSESOPHAGEAL ECHOCARDIOGRAM (TEE);  Surgeon: Jake Bathe, MD;  Location: [facility] ENDOSCOPY;  Service: Cardiovascular;  Laterality: N/A;   TOTAL HIP ARTHROPLASTY Right 2011   TOTAL SHOULDER REPLACEMENT Right 2003   TOTAL SHOULDER REPLACEMENT Left 2007   WRIST SURGERY Bilateral 2008   both wrists    Family History  Problem Relation Age of Onset   Hypercalcemia Daughter    Hypertension Daughter     Social History:  reports that she quit smoking about 9 years ago. Her smoking use included cigarettes. She has a 65.00 pack-year smoking history. She has never used smokeless tobacco.  She reports current alcohol use of about 1.0 - 2.0 standard drink of alcohol per week. She reports that she does not use drugs.  Allergies:  Allergies  Allergen Reactions   Banana Nausea Only    All banana products also   Penicillins Hives    Medications: I have reviewed the patient's current medications.  Results for orders placed or performed during the hospital encounter of 11/15/20 (from the past 48 hour(s))  CBC with Differential     Status: None   Collection Time: 11/16/20 12:05 AM  Result Value Ref Range   WBC 7.8 4.0 - 10.5 K/uL   RBC 4.36 3.87 - 5.11 MIL/uL   Hemoglobin 13.1 12.0 - 15.0 g/dL   HCT 50.3 54.6 - 56.8 %   MCV 93.6 80.0 - 100.0 fL   MCH 30.0 26.0 - 34.0 pg   MCHC 32.1 30.0 - 36.0 g/dL   RDW 12.7 51.7 - 00.1 %   Platelets 316 150 - 400 K/uL   nRBC 0.0 0.0 - 0.2 %   Neutrophils Relative % 71 %   Neutro Abs 5.6 1.7 - 7.7 K/uL   Lymphocytes Relative 14 %   Lymphs Abs 1.1 0.7 - 4.0 K/uL   Monocytes Relative 11 %   Monocytes Absolute 0.8 0.1 - 1.0 K/uL   Eosinophils Relative 2 %   Eosinophils Absolute 0.2 0.0 - 0.5 K/uL   Basophils Relative 1 %   Basophils Absolute 0.1 0.0 - 0.1  K/uL   Immature Granulocytes 1 %   Abs Immature Granulocytes 0.04 0.00 - 0.07 K/uL    Comment: Performed at [facility], [address], [place], [place] [address]  Basic metabolic panel     Status: Abnormal   Collection Time: 11/16/20 12:05 AM  Result Value Ref Range   Sodium 134 (L) 135 - 145 mmol/L   Potassium 4.2 3.5 - 5.1 mmol/L   Chloride 100 98 - 111 mmol/L   CO2 26 22 - 32 mmol/L   Glucose, Bld 136 (H) 70 - 99 mg/dL    Comment: Glucose reference range applies only to samples taken after fasting for at least 8 hours.   BUN 21 8 - 23 mg/dL   Creatinine, Ser 4.76 0.44 - 1.00 mg/dL   Calcium 9.5 8.9 - 54.6 mg/dL   GFR, Estimated 56 (L) >60 mL/min    Comment: (NOTE) Calculated using the CKD-EPI Creatinine Equation (2021)    Anion gap 8 5 - 15    Comment:  Performed at [facility], [address] [place], [place] [address]  Protime-INR     Status: Abnormal   Collection Time: 11/16/20 12:05 AM  Result Value Ref Range   Prothrombin Time 18.9 (H) 11.4 - 15.2 seconds   INR 1.6 (H) 0.8 - 1.2    Comment: (NOTE) INR goal varies based on device and disease states. Performed at [facility], [address] [place], [place] [address]   Resp Panel by RT-PCR (Flu A&B, Covid) Nasopharyngeal Swab     Status: None   Collection Time: 11/16/20  1:25 AM   Specimen: Nasopharyngeal Swab; Nasopharyngeal(NP) swabs in vial transport medium  Result Value Ref Range   SARS Coronavirus 2 by RT PCR NEGATIVE NEGATIVE    Comment: (NOTE) SARS-CoV-2 target nucleic acids are NOT DETECTED.  The SARS-CoV-2 RNA is generally detectable in upper respiratory specimens during the acute phase of infection. The lowest concentration of SARS-CoV-2 viral copies this assay can detect is 138 copies/mL. A negative result does not preclude SARS-Cov-2 infection and should not be used as the sole basis for treatment or other patient management decisions. A negative result may occur with  improper specimen collection/handling, submission of specimen other than nasopharyngeal swab, presence of viral mutation(s) within the areas targeted by this assay, and inadequate number of viral copies(<138 copies/mL). A negative result must be combined with clinical observations, patient history, and epidemiological information. The expected result is Negative.  Fact Sheet for Patients:  BloggerCourse.com  Fact Sheet for Healthcare Providers:  SeriousBroker.it  This test is no t yet approved or cleared by the [place] FDA and  has been authorized for detection and/or diagnosis of SARS-CoV-2 by FDA under an Emergency Use Authorization (EUA). This EUA will remain  in effect (meaning this test can be used) for the duration of  the COVID-19 declaration under Section 564(b)(1) of the Act, 21 U.S.C.section 360bbb-3(b)(1), unless the authorization is terminated  or revoked sooner.       Influenza A by PCR NEGATIVE NEGATIVE   Influenza B by PCR NEGATIVE NEGATIVE    Comment: (NOTE) The Xpert Xpress SARS-CoV-2/FLU/RSV plus assay is intended as an aid in the diagnosis of influenza from Nasopharyngeal swab specimens and should not be used as a sole basis for treatment. Nasal washings and aspirates are unacceptable for Xpert Xpress SARS-CoV-2/FLU/RSV testing.  Fact Sheet for Patients: BloggerCourse.com  Fact Sheet for Healthcare Providers: SeriousBroker.it  This test is not  yet approved or cleared by the [place] FDA and has been authorized for detection and/or diagnosis of SARS-CoV-2 by FDA under an Emergency Use Authorization (EUA). This EUA will remain in effect (meaning this test can be used) for the duration of the COVID-19 declaration under Section 564(b)(1) of the Act, 21 U.S.C. section 360bbb-3(b)(1), unless the authorization is terminated or revoked.  Performed at [facility], [address] [place], [place] [address]     DG Pelvis 1-2 Views  Result Date: 11/16/2020 CLINICAL DATA:  Fall, preop EXAM: PELVIS - 1-2 VIEW COMPARISON:  None. FINDINGS: Right hip replacement. There is a right mid femoral fracture image below the femoral stem. This is angulated. Joint space narrowing and spurring in the left hip. Calcified fibroid in the mid pelvis. IMPRESSION: Angulated mid right femoral fracture below the hip replacement stem. Electronically Signed   By: Kevin  Dover M.D.   On: 11/16/2020 00:59   DG Chest Port 1 View  Result Date: 11/16/2020 CLINICAL DATA:  Fall, preop EXAM: PORTABLE CHEST 1 VIEW COMPARISON:  06/05/2015 FINDINGS: Cardiomegaly. Aortic atherosclerosis. Lungs clear. No effusions or edema. No acute bony abnormality. IMPRESSION:  Cardiomegaly.  No active disease. Electronically Signed   By: Kevin  Dover M.D.   On: 11/16/2020 00:57   DG Femur Min 2 Views Right  Result Date: 11/16/2020 CLINICAL DATA:  Fall EXAM: RIGHT FEMUR 2 VIEWS COMPARISON:  None. FINDINGS: There is a right hip replacement. Mid right femoral fracture is noted below the femoral stem with angulation, displacement and overlapping fragments. No subluxation or dislocation. IMPRESSION: Mid right femoral shaft fracture with overlapping, angulated and displaced fragments. Electronically Signed   By: Kevin  Dover M.D.   On: 11/16/2020 01:00    Review of Systems  HENT:  Negative for ear discharge, ear pain, hearing loss and tinnitus.   Eyes:  Negative for photophobia and pain.  Respiratory:  Negative for cough and shortness of breath.   Cardiovascular:  Negative for chest pain.  Gastrointestinal:  Negative for abdominal pain, nausea and vomiting.  Genitourinary:  Negative for dysuria, flank pain, frequency and urgency.  Musculoskeletal:  Positive for arthralgias (Right thigh). Negative for back pain, myalgias and neck pain.  Neurological:  Negative for dizziness and headaches.  Hematological:  Does not bruise/bleed easily.  Psychiatric/Behavioral:  The patient is not nervous/anxious.   Blood pressure 96/67, pulse 66, temperature (!) 97.2 F (36.2 C), temperature source Oral, resp. rate (!) 21, SpO2 95 %. Physical Exam Constitutional:      General: She is not in acute distress.    Appearance: She is well-developed. She is not diaphoretic.  HENT:     Head: Normocephalic and atraumatic.  Eyes:     General: No scleral icterus.       Right eye: No discharge.        Left eye: No discharge.     Conjunctiva/sclera: Conjunctivae normal.  Cardiovascular:     Rate and Rhythm: Normal rate and regular rhythm.  Pulmonary:     Effort: Pulmonary effort is normal. No respiratory distress.  Musculoskeletal:     Cervical back: Normal range of motion.     Comments:  RLE No traumatic wounds, ecchymosis, or rash  Mod TTP thigh  No knee or ankle effusion  Knee stable to varus/ valgus and anterior/posterior stress  Sens DPN, SPN, TN intact  Motor EHL, ext, flex, evers 5/5  DP 1+, PT 0, No significant edema  Skin:    General: Skin   is warm and dry.  Neurological:     Mental Status: She is alert.  Psychiatric:        Mood and Affect: Mood normal.        Behavior: Behavior normal.    Assessment/Plan: Right femur fx -- Plan ORIF Thursday with Dr. Jena Gauss. Please keep NPO after MN Wednesday. Multiple medical problems including permanent A. fib on Eliquis, hypothyroidism, osteoarthritis, depression, and spinal stenosis of lumbar region with neurogenic claudication -- Please continue to hold Eliquis. If feel the need for heparin gtts for bridging please stop 6h prior to surgery.    Freeman Caldron, PA-C Orthopedic Surgery [phone] 11/16/2020, 9:56 AM

## 2020-11-16 NOTE — Consult Note (Signed)
Reason for Consult:Right femur fx Referring Physician: John Giovanni Time called: [id number] Time at bedside: 0944   Katrina Velez is an 87 y.o. female.  HPI: Katrina Velez fell at home last night after slipping on something on the floor. She had immediate pain in her right thigh and could not get up. She was brought to the ED where x-rays showed a right periprosthetic femur fx and orthopedic surgery was consulted. Due to the complex nature of the fracture orthopedic trauma consultation was requested. She lives at home and usually ambulates with the aid of a walking stick.  Past Medical History:  Diagnosis Date   A-fib (HCC) 05/2015   HOSPITALIZED    Arthritis    Cataract    Hypothyroidism    Permanent atrial fibrillation (HCC)    Thyroid disease     Past Surgical History:  Procedure Laterality Date   CARDIOVERSION N/A 06/08/2015   Procedure: CARDIOVERSION;  Surgeon: Jake Bathe, MD;  Location: [facility] ENDOSCOPY;  Service: Cardiovascular;  Laterality: N/A;   CARDIOVERSION N/A 09/02/2015   Procedure: CARDIOVERSION;  Surgeon: Laqueta Linden, MD;  Location: [facility] ENDOSCOPY;  Service: Cardiovascular;  Laterality: N/A;   CATARACT EXTRACTION, BILATERAL Bilateral 2010   JOINT REPLACEMENT     KNEE SURGERY  2002   right hip replacement     RIGHT KNEE REPLACEMENT  2011   TEE WITHOUT CARDIOVERSION N/A 06/08/2015   Procedure: TRANSESOPHAGEAL ECHOCARDIOGRAM (TEE);  Surgeon: Jake Bathe, MD;  Location: [facility] ENDOSCOPY;  Service: Cardiovascular;  Laterality: N/A;   TOTAL HIP ARTHROPLASTY Right 2011   TOTAL SHOULDER REPLACEMENT Right 2003   TOTAL SHOULDER REPLACEMENT Left 2007   WRIST SURGERY Bilateral 2008   both wrists    Family History  Problem Relation Age of Onset   Hypercalcemia Daughter    Hypertension Daughter     Social History:  reports that she quit smoking about 9 years ago. Her smoking use included cigarettes. She has a 65.00 pack-year smoking history. She has never used smokeless tobacco.  She reports current alcohol use of about 1.0 - 2.0 standard drink of alcohol per week. She reports that she does not use drugs.  Allergies:  Allergies  Allergen Reactions   Banana Nausea Only    All banana products also   Penicillins Hives    Medications: I have reviewed the patient's current medications.  Results for orders placed or performed during the hospital encounter of 11/15/20 (from the past 48 hour(s))  CBC with Differential     Status: None   Collection Time: 11/16/20 12:05 AM  Result Value Ref Range   WBC 7.8 4.0 - 10.5 K/uL   RBC 4.36 3.87 - 5.11 MIL/uL   Hemoglobin 13.1 12.0 - 15.0 g/dL   HCT 50.3 54.6 - 56.8 %   MCV 93.6 80.0 - 100.0 fL   MCH 30.0 26.0 - 34.0 pg   MCHC 32.1 30.0 - 36.0 g/dL   RDW 12.7 51.7 - 00.1 %   Platelets 316 150 - 400 K/uL   nRBC 0.0 0.0 - 0.2 %   Neutrophils Relative % 71 %   Neutro Abs 5.6 1.7 - 7.7 K/uL   Lymphocytes Relative 14 %   Lymphs Abs 1.1 0.7 - 4.0 K/uL   Monocytes Relative 11 %   Monocytes Absolute 0.8 0.1 - 1.0 K/uL   Eosinophils Relative 2 %   Eosinophils Absolute 0.2 0.0 - 0.5 K/uL   Basophils Relative 1 %   Basophils Absolute 0.1 0.0 - 0.1  K/uL   Immature Granulocytes 1 %   Abs Immature Granulocytes 0.04 0.00 - 0.07 K/uL    Comment: Performed at [facility], [address], [place], [place] [address]  Basic metabolic panel     Status: Abnormal   Collection Time: 11/16/20 12:05 AM  Result Value Ref Range   Sodium 134 (L) 135 - 145 mmol/L   Potassium 4.2 3.5 - 5.1 mmol/L   Chloride 100 98 - 111 mmol/L   CO2 26 22 - 32 mmol/L   Glucose, Bld 136 (H) 70 - 99 mg/dL    Comment: Glucose reference range applies only to samples taken after fasting for at least 8 hours.   BUN 21 8 - 23 mg/dL   Creatinine, Ser 4.76 0.44 - 1.00 mg/dL   Calcium 9.5 8.9 - 54.6 mg/dL   GFR, Estimated 56 (L) >60 mL/min    Comment: (NOTE) Calculated using the CKD-EPI Creatinine Equation (2021)    Anion gap 8 5 - 15    Comment:  Performed at [facility], [address] [place], [place] [address]  Protime-INR     Status: Abnormal   Collection Time: 11/16/20 12:05 AM  Result Value Ref Range   Prothrombin Time 18.9 (H) 11.4 - 15.2 seconds   INR 1.6 (H) 0.8 - 1.2    Comment: (NOTE) INR goal varies based on device and disease states. Performed at [facility], [address] [place], [place] [address]   Resp Panel by RT-PCR (Flu A&B, Covid) Nasopharyngeal Swab     Status: None   Collection Time: 11/16/20  1:25 AM   Specimen: Nasopharyngeal Swab; Nasopharyngeal(NP) swabs in vial transport medium  Result Value Ref Range   SARS Coronavirus 2 by RT PCR NEGATIVE NEGATIVE    Comment: (NOTE) SARS-CoV-2 target nucleic acids are NOT DETECTED.  The SARS-CoV-2 RNA is generally detectable in upper respiratory specimens during the acute phase of infection. The lowest concentration of SARS-CoV-2 viral copies this assay can detect is 138 copies/mL. A negative result does not preclude SARS-Cov-2 infection and should not be used as the sole basis for treatment or other patient management decisions. A negative result may occur with  improper specimen collection/handling, submission of specimen other than nasopharyngeal swab, presence of viral mutation(s) within the areas targeted by this assay, and inadequate number of viral copies(<138 copies/mL). A negative result must be combined with clinical observations, patient history, and epidemiological information. The expected result is Negative.  Fact Sheet for Patients:  BloggerCourse.com  Fact Sheet for Healthcare Providers:  SeriousBroker.it  This test is no t yet approved or cleared by the [place] FDA and  has been authorized for detection and/or diagnosis of SARS-CoV-2 by FDA under an Emergency Use Authorization (EUA). This EUA will remain  in effect (meaning this test can be used) for the duration of  the COVID-19 declaration under Section 564(b)(1) of the Act, 21 U.S.C.section 360bbb-3(b)(1), unless the authorization is terminated  or revoked sooner.       Influenza A by PCR NEGATIVE NEGATIVE   Influenza B by PCR NEGATIVE NEGATIVE    Comment: (NOTE) The Xpert Xpress SARS-CoV-2/FLU/RSV plus assay is intended as an aid in the diagnosis of influenza from Nasopharyngeal swab specimens and should not be used as a sole basis for treatment. Nasal washings and aspirates are unacceptable for Xpert Xpress SARS-CoV-2/FLU/RSV testing.  Fact Sheet for Patients: BloggerCourse.com  Fact Sheet for Healthcare Providers: SeriousBroker.it  This test is not  yet approved or cleared by the [place] FDA and has been authorized for detection and/or diagnosis of SARS-CoV-2 by FDA under an Emergency Use Authorization (EUA). This EUA will remain in effect (meaning this test can be used) for the duration of the COVID-19 declaration under Section 564(b)(1) of the Act, 21 U.S.C. section 360bbb-3(b)(1), unless the authorization is terminated or revoked.  Performed at [facility], [address] [place], [place] [address]     DG Pelvis 1-2 Views  Result Date: 11/16/2020 CLINICAL DATA:  Fall, preop EXAM: PELVIS - 1-2 VIEW COMPARISON:  None. FINDINGS: Right hip replacement. There is a right mid femoral fracture image below the femoral stem. This is angulated. Joint space narrowing and spurring in the left hip. Calcified fibroid in the mid pelvis. IMPRESSION: Angulated mid right femoral fracture below the hip replacement stem. Electronically Signed   By: Charlett NoseKevin  Dover M.D.   On: 11/16/2020 00:59   DG Chest Port 1 View  Result Date: 11/16/2020 CLINICAL DATA:  Fall, preop EXAM: PORTABLE CHEST 1 VIEW COMPARISON:  06/05/2015 FINDINGS: Cardiomegaly. Aortic atherosclerosis. Lungs clear. No effusions or edema. No acute bony abnormality. IMPRESSION:  Cardiomegaly.  No active disease. Electronically Signed   By: Charlett NoseKevin  Dover M.D.   On: 11/16/2020 00:57   DG Femur Min 2 Views Right  Result Date: 11/16/2020 CLINICAL DATA:  Fall EXAM: RIGHT FEMUR 2 VIEWS COMPARISON:  None. FINDINGS: There is a right hip replacement. Mid right femoral fracture is noted below the femoral stem with angulation, displacement and overlapping fragments. No subluxation or dislocation. IMPRESSION: Mid right femoral shaft fracture with overlapping, angulated and displaced fragments. Electronically Signed   By: Charlett NoseKevin  Dover M.D.   On: 11/16/2020 01:00    Review of Systems  HENT:  Negative for ear discharge, ear pain, hearing loss and tinnitus.   Eyes:  Negative for photophobia and pain.  Respiratory:  Negative for cough and shortness of breath.   Cardiovascular:  Negative for chest pain.  Gastrointestinal:  Negative for abdominal pain, nausea and vomiting.  Genitourinary:  Negative for dysuria, flank pain, frequency and urgency.  Musculoskeletal:  Positive for arthralgias (Right thigh). Negative for back pain, myalgias and neck pain.  Neurological:  Negative for dizziness and headaches.  Hematological:  Does not bruise/bleed easily.  Psychiatric/Behavioral:  The patient is not nervous/anxious.   Blood pressure 96/67, pulse 66, temperature (!) 97.2 F (36.2 C), temperature source Oral, resp. rate (!) 21, SpO2 95 %. Physical Exam Constitutional:      General: She is not in acute distress.    Appearance: She is well-developed. She is not diaphoretic.  HENT:     Head: Normocephalic and atraumatic.  Eyes:     General: No scleral icterus.       Right eye: No discharge.        Left eye: No discharge.     Conjunctiva/sclera: Conjunctivae normal.  Cardiovascular:     Rate and Rhythm: Normal rate and regular rhythm.  Pulmonary:     Effort: Pulmonary effort is normal. No respiratory distress.  Musculoskeletal:     Cervical back: Normal range of motion.     Comments:  RLE No traumatic wounds, ecchymosis, or rash  Mod TTP thigh  No knee or ankle effusion  Knee stable to varus/ valgus and anterior/posterior stress  Sens DPN, SPN, TN intact  Motor EHL, ext, flex, evers 5/5  DP 1+, PT 0, No significant edema  Skin:    General: Skin  is warm and dry.  Neurological:     Mental Status: She is alert.  Psychiatric:        Mood and Affect: Mood normal.        Behavior: Behavior normal.    Assessment/Plan: Right femur fx -- Plan ORIF Thursday with Dr. Jena Gauss. Please keep NPO after MN Wednesday. Multiple medical problems including permanent A. fib on Eliquis, hypothyroidism, osteoarthritis, depression, and spinal stenosis of lumbar region with neurogenic claudication -- Please continue to hold Eliquis. If feel the need for heparin gtts for bridging please stop 6h prior to surgery.    Freeman Caldron, PA-C Orthopedic Surgery [phone] 11/16/2020, 9:56 AM

## 2020-11-17 DIAGNOSIS — E034 Atrophy of thyroid (acquired): Secondary | ICD-10-CM

## 2020-11-17 DIAGNOSIS — I4821 Permanent atrial fibrillation: Secondary | ICD-10-CM

## 2020-11-17 LAB — BASIC METABOLIC PANEL
Anion gap: 7 (ref 5–15)
BUN: 27 mg/dL — ABNORMAL HIGH (ref 8–23)
CO2: 29 mmol/L (ref 22–32)
Calcium: 9.8 mg/dL (ref 8.9–10.3)
Chloride: 96 mmol/L — ABNORMAL LOW (ref 98–111)
Creatinine, Ser: 1.01 mg/dL — ABNORMAL HIGH (ref 0.44–1.00)
GFR, Estimated: 52 mL/min — ABNORMAL LOW (ref >60)
Glucose, Bld: 112 mg/dL — ABNORMAL HIGH (ref 70–99)
Potassium: 4.3 mmol/L (ref 3.5–5.1)
Sodium: 132 mmol/L — ABNORMAL LOW (ref 135–145)

## 2020-11-17 MED ORDER — POVIDONE-IODINE 10 % EX SWAB
2.0000 "application " | Freq: Once | CUTANEOUS | Status: AC
  Administered 2020-11-18: 2 via TOPICAL

## 2020-11-17 MED ORDER — HALOPERIDOL LACTATE 5 MG/ML IJ SOLN
2.0000 mg | Freq: Once | INTRAMUSCULAR | Status: AC
  Administered 2020-11-17: 2 mg via INTRAMUSCULAR
  Filled 2020-11-17: qty 1

## 2020-11-17 MED ORDER — ENOXAPARIN SODIUM 30 MG/0.3ML IJ SOSY
30.0000 mg | PREFILLED_SYRINGE | Freq: Every day | INTRAMUSCULAR | Status: DC
  Administered 2020-11-17: 30 mg via SUBCUTANEOUS
  Filled 2020-11-17: qty 0.3

## 2020-11-17 MED ORDER — KCL IN DEXTROSE-NACL 20-5-0.9 MEQ/L-%-% IV SOLN
INTRAVENOUS | Status: DC
  Filled 2020-11-17 (×5): qty 1000

## 2020-11-17 MED ORDER — ADULT MULTIVITAMIN W/MINERALS CH
1.0000 | ORAL_TABLET | Freq: Every day | ORAL | Status: DC
  Administered 2020-11-17 – 2020-11-24 (×8): 1 via ORAL
  Filled 2020-11-17 (×8): qty 1

## 2020-11-17 MED ORDER — BOOST PLUS PO LIQD
237.0000 mL | Freq: Three times a day (TID) | ORAL | Status: DC
  Administered 2020-11-17 – 2020-11-19 (×3): 237 mL via ORAL
  Filled 2020-11-17 (×19): qty 237

## 2020-11-17 MED ORDER — VANCOMYCIN HCL IN DEXTROSE 1-5 GM/200ML-% IV SOLN
1000.0000 mg | INTRAVENOUS | Status: AC
  Administered 2020-11-18: 1000 mg via INTRAVENOUS
  Filled 2020-11-17: qty 200

## 2020-11-17 MED ORDER — HYDROCODONE-ACETAMINOPHEN 5-325 MG PO TABS: 1.0000 | ORAL_TABLET | ORAL | Status: DC | PRN

## 2020-11-17 MED ORDER — CHLORHEXIDINE GLUCONATE 4 % EX LIQD: 60.0000 mL | Freq: Once | CUTANEOUS | Status: DC

## 2020-11-17 MED ORDER — ACETAMINOPHEN 325 MG PO TABS
650.0000 mg | ORAL_TABLET | ORAL | Status: DC | PRN
  Administered 2020-11-21 – 2020-11-23 (×5): 650 mg via ORAL
  Filled 2020-11-17 (×4): qty 2

## 2020-11-17 MED ORDER — TRAMADOL HCL 50 MG PO TABS: 50.0000 mg | ORAL_TABLET | Freq: Four times a day (QID) | ORAL | Status: DC | PRN

## 2020-11-17 NOTE — Progress Notes (Signed)
   11/17/20 0335  Assess: MEWS Score  BP (!) 80/60  Pulse Rate (!) 137  Resp 20  Level of Consciousness Alert  Assess: MEWS Score  MEWS Temp 0  MEWS Systolic 2  MEWS Pulse 3  MEWS RR 0  MEWS LOC 0  MEWS Score 5  MEWS Score Color Red  Assess: if the MEWS score is Yellow or Red  Were vital signs taken at a resting state? No  Focused Assessment No change from prior assessment  Early Detection of Sepsis Score *See Row Information* Low  Treat  MEWS Interventions Escalated (See documentation below)  Pain Scale PAINAD  Pain Intervention(s) MD notified (Comment)  Take Vital Signs  Increase Vital Sign Frequency  Red: Q 1hr X 4 then Q 4hr X 4, if remains red, continue Q 4hrs  Escalate  MEWS: Escalate Red: discuss with charge nurse/RN and provider, consider discussing with RRT  Notify: Charge Nurse/RN  Name of Charge Nurse/RN Notified JOYCE  Date Charge Nurse/RN Notified 11/17/20  Time Charge Nurse/RN Notified 0358  Notify: Provider  Provider Name/Title [facility]  Date Provider Notified 11/17/20  Time Provider Notified [phone]  Notification Type Page  Date of Provider Response 11/17/20  Time of Provider Response 0400

## 2020-11-17 NOTE — Progress Notes (Signed)
HOSPITAL MEDICINE OVERNIGHT EVENT NOTE    Notified by nursing that patient was exhibiting bouts of increased agitation and confusion throughout the evening.  Patient was exhibiting significant aggression with staff despite being in ongoing traction.  Significant progression of patient's confusion seemed to occur after the evening dose of 0.5 mg of IV morphine leading me to believe that despite small dose this could have been a contributing factor.  Patient was assessed by rapid response earlier in the evening.  Additionally, Chinita Greenland, NP administered 2 mg of intravenous Haldol due to her ongoing agitation.  I went to go evaluate the patient at the bedside.  At the time of my arrival the patient seems to be sleeping peacefully.  Of note, patient seems to be exhibiting low blood pressures, particularly throughout the evening shift.  I am uncertain as to the accuracy of these blood pressures as the patient has been struggling with staff throughout the evening.  Furthermore, it seems the patient possesses low normal blood pressures at baseline anyway based on review of blood pressures on 6/28.    No obvious evidence of bleeding or infection.  Continue to monitor closely on telemetry.  Marinda Elk  MD [facility]

## 2020-11-17 NOTE — Progress Notes (Signed)
Increasingly agitated- medicated for pain with morphine- only increased confusion- pt picking at lines and tubes causing  a lot of artifact on monitor- causing erroneous heart rate readings- notified on call for [facility] Onalee Hua RN house supervisor came up to bedside for evaluation-pt had pulled out iv- and Onalee Hua restarted- and will update on call phys about status- mittins placed for sfety and to protect new iv site.

## 2020-11-17 NOTE — Progress Notes (Signed)
   11/17/20 0124  Assess: MEWS Score  BP 124/83  Pulse Rate 99  ECG Heart Rate (!) 121  Resp (!) 24  SpO2 98 %  O2 Device Nasal Cannula  O2 Flow Rate (L/min) 2 L/min  Assess: MEWS Score  MEWS Temp 0  MEWS Systolic 0  MEWS Pulse 2  MEWS RR 1  MEWS LOC 0  MEWS Score 3  MEWS Score Color Yellow  Assess: if the MEWS score is Yellow or Red  Were vital signs taken at a resting state? No  Focused Assessment No change from prior assessment  Early Detection of Sepsis Score *See Row Information* Low  MEWS guidelines implemented *See Row Information* Yes  Treat  MEWS Interventions Escalated (See documentation below)  Pain Scale PAINAD  Pain Intervention(s) MD notified (Comment)  Take Vital Signs  Increase Vital Sign Frequency  Yellow: Q 2hr X 2 then Q 4hr X 2, if remains yellow, continue Q 4hrs  Escalate  MEWS: Escalate Yellow: discuss with charge nurse/RN and consider discussing with provider and RRT  Notify: Charge Nurse/RN  Name of Charge Nurse/RN Notified joyce  Date Charge Nurse/RN Notified 11/17/20  Time Charge Nurse/RN Notified 0155  Notify: Provider  Provider Name/Title [facility]  Date Provider Notified 11/17/20  Time Provider Notified 2300  Notification Type Page  Notification Reason Other (Comment) (aggitation)  Provider response Other (Comment) (house superviser at bedside)

## 2020-11-17 NOTE — Progress Notes (Signed)
Central tele notified this RN that HR spiked into the 150s. Paged APP Garner Nash to update at this time.

## 2020-11-17 NOTE — Progress Notes (Addendum)
Initial Nutrition Assessment  DOCUMENTATION CODES:   Not applicable  INTERVENTION:   -D/c Ensure Enlive -MVI with minerals daily -Boost Plus TID, each supplement provides 360 kcals and 16 grams protein  NUTRITION DIAGNOSIS:   Inadequate oral intake related to decreased appetite as evidenced by per patient/family report.  GOAL:   Patient will meet greater than or equal to 90% of their needs  MONITOR:   PO intake, Supplement acceptance, Labs, Weight trends, Skin, I & O's  REASON FOR ASSESSMENT:   Consult Assessment of nutrition requirement/status, Poor PO, Hip fracture protocol  ASSESSMENT:   Katrina Velez is a 87 y.o. female with medical history significant of permanent A. fib on Eliquis, hypothyroidism, osteoarthritis, depression, spinal stenosis of lumbar region with neurogenic claudication presented to the ED via EMS complaining of right thigh pain after a mechanical fall.  No significant lab abnormalities.  Screening COVID test negative.  X-ray showing mild right femoral shaft fracture with overlapping angulated and displaced fragments.  Chest x-ray showing no active disease. ED PA spoke to on-call provider for [facility] orthopedics, will consult in a.m.  Pt admitted with rt femur fracture s/p mechanical fall.   Per ortho notes, plan for ORIF tomorrow (11/18/20).   Spoke with pt and son at bedside. Per son, pt is not her usual self. She is currently very lethargic and not eating and drinking, which is concerning for him. Pt has been drinking a few sips of water and has not voided yet today.   RD able to arouse pt and pt able to communicate better after helping her locate and place her hearing aids. Son and pt reports good appetite PTA (Breakfast: oatmeal and eggs; Lunch: sandwich; Dinner: meat, starch, and vegetable). Pt consumes Ensure daily, but only likes the dark chocolate flavor; pt tried Ensure Enlive yesterday and did not like the chocolate flavor. RD was able to get pt  to have several sips of water with encouragement.   Per son, wt has been stable, but suspects she has lost weight during hospitalization due to not eating here. Reviewed wt hx; wt has been stable over the past 6 months.   Discussed importance of good meal and supplement intake to promote healing. Will trial Boost Plus (pt family member brought in preferred flavor of Ensure today from home).   Noted pt with generalized fat and muscle depletions likes due to advanced age. Pt ambulate with a cane or walker at baseline.   Medications reviewed and include cardizem, remeron, and senokot.   Labs reviewed: Na: 132.   NUTRITION - FOCUSED PHYSICAL EXAM:  Flowsheet Row Most Recent Value  Orbital Region Mild depletion  Upper Arm Region Moderate depletion  Thoracic and Lumbar Region No depletion  Buccal Region Mild depletion  Temple Region Mild depletion  Clavicle Bone Region Moderate depletion  Clavicle and Acromion Bone Region Moderate depletion  Scapular Bone Region Moderate depletion  Dorsal Hand Mild depletion  Patellar Region Moderate depletion  Anterior Thigh Region Moderate depletion  Posterior Calf Region Moderate depletion  Edema (RD Assessment) None  Hair Reviewed  Eyes Reviewed  Mouth Reviewed  Skin Reviewed  Nails Reviewed       Diet Order:   Diet Order             Diet regular Room service appropriate? Yes; Fluid consistency: Thin  Diet effective now                   EDUCATION NEEDS:   Education needs  have been addressed  Skin:  Skin Assessment: Reviewed RN Assessment  Last BM:  11/15/20  Height:   Ht Readings from Last 1 Encounters:  11/16/20 5\' 2"  (1.575 m)    Weight:   Wt Readings from Last 1 Encounters:  11/16/20 65.8 kg    Ideal Body Weight:  50 kg  BMI:  Body mass index is 26.52 kg/m.  Estimated Nutritional Needs:   Kcal:  1750-1950  Protein:  85-100 grams  Fluid:  > 1.7 L    11/18/20, RD, LDN, CDCES Registered Dietitian  II Certified Diabetes Care and Education Specialist Please refer to [facility] for RD and/or RD on-call/weekend/after hours pager

## 2020-11-17 NOTE — Progress Notes (Signed)
   11/17/20 0500  Assess: MEWS Score  BP (!) 90/43  Pulse Rate (!) 112  Level of Consciousness Alert  SpO2 95 %  Assess: MEWS Score  MEWS Temp 0  MEWS Systolic 1  MEWS Pulse 2  MEWS RR 0  MEWS LOC 0  MEWS Score 3  MEWS Score Color Yellow  Assess: if the MEWS score is Yellow or Red  Were vital signs taken at a resting state? Yes  Focused Assessment No change from prior assessment  Early Detection of Sepsis Score *See Row Information* Low  MEWS guidelines implemented *See Row Information* No, previously red, continue vital signs every 4 hours  Treat  MEWS Interventions Administered prn meds/treatments  Pain Scale PAINAD  Faces Pain Scale 2  Take Vital Signs  Increase Vital Sign Frequency  Red: Q 1hr X 4 then Q 4hr X 4, if remains red, continue Q 4hrs  Escalate  MEWS: Escalate Yellow: discuss with charge nurse/RN and consider discussing with provider and RRT  Notify: Charge Nurse/RN  Name of Charge Nurse/RN Notified joyce  Date Charge Nurse/RN Notified 11/17/20  Time Charge Nurse/RN Notified 0500  Document  Patient Outcome Stabilized after interventions  Progress note created (see row info) Yes

## 2020-11-17 NOTE — Progress Notes (Signed)
Declines to allow o2 sensor to be replaced at this time

## 2020-11-17 NOTE — Progress Notes (Signed)
PROGRESS NOTE    Katrina Velez  HCW:[id number] DOB: December 20, 1925 DOA: 11/15/2020 PCP: System, Provider Not In   Brief Narrative:  The patient is a 87 year old elderly Caucasian female with a past medical history significant for but not limited to permanent atrial fibrillation on anticoagulation with apixaban, hypothyroidism, osteoarthritis, depression, spinal stenosis of his lumbar spine with neurogenic claudication as well as other comorbidities presented to the ED via EMS complaining of right thigh pain after mechanical fall.  She had no significant abnormalities noted on her laboratory work.  She screened negative for COVID.  X-ray did of the hip showed mild right femoral shaft fracture with overlapping angulated and displaced fragments.  Chest x-ray showed no active disease and the EDP spoke to the orthopedic surgeon who consulted and are planning for surgical intervention on Thursday.  Patient was walking from her bedroom to the kitchen to take her evening medications and slept and fell down to the floor.  She denied hitting her head or loss of consciousness.  She did take Eliquis the night that she fell.  Overnight she became extremely combative and agitated after administration of IV morphine.  She was subsequently given IV Haldol given the agitation.  This morning she is a little groggy and a little confused.  Assessment & Plan:   Principal Problem:   Femur fracture (HCC) Active Problems:   Hypothyroidism post removal of goiter   Atrial fibrillation (HCC)  Right Femur Fracture -In the setting of Fall -No concern for Syncopal Episode -Orthopedic Surgery Consulted with plans for Surgical Intervention in 2 days given Eliquis Use -Judicious use of Narcotics -VTE Prophylaxis with Enoxaparin 30 mg sq q24h -Ortho Recommendind PWB RLE 50% and recommending that the Dressings -PT/OT to evaluate and Treat  -Pain Control with Acetaminophen 650 mg po q4hprn Mild Pain, Hydrocodone-Acetaminophen 5-325  mg po q4hprn Severe Pain and Tramadol 50 mg po q6hprn Moderate Pain, WILL NOT USE ANYMORE IV Morphine given her Agitation and combativeness -C/w Robaxin 500 mg po/IV q6hprn Muscle Spasms -Gentle IVF Hydration with D5 NS a  -Bowel Regimen with Senna 8.6 mg po 1 tab BID  -C/w Supportive Care  Permanent Atrial Fibrillation -Hold Eliquis (Takes 5 mg po BID but will likley need to reduce to 2.5 mg po BID when re-ordered) -C/w with Diltaizem 120 mg po BID and Metoprolol Succinate 25 mg po BID' -INR was 1.6 -Continue to Monitor on Telemetry  Iatrogenic Hypothyroidism (Post Removal of Goiter) -Check TSH -C/w Levothyroxine 125 mcg po Daily   Hyponatremia -Mild. Na+ went from 134 -> 132 -Started D5 NS at 75 mL/hr -Continue to Monitor and Trend -Repeat CMP in the AM   Acute Agitation and Confusion -Had significant aggression towards the staff and removed IV -In the setting of Narcotics, specifically IV Morphine 0.5 mg administration -Daughter states the patient had a similar episode 30 years ago after morphine -STOP Morphine -Given 2 mg of IV Haldol overnight -Delirium Precautions  CKD Stage 3a -BUN/Cr went from 21/0.95 -> 27/1.01 -IVF as above -Avoid Further Nephrotoxic Medications, Contrast Dyes, and Hypotension and Renally adjust Medications -Repeat CMP in the AM   DVT prophylaxis: Enoxaparin 30 mg sq q24h Code Status: FULL CODE  Family Communication: Discussed with Daughter at bedside  Disposition Plan: Pending further clinical improvement and clearance by orthopedic surgery wants to do the surgery on Thursday  Status is: Inpatient  Remains inpatient appropriate because:Unsafe d/c plan, IV treatments appropriate due to intensity of illness or inability to take PO, and  Inpatient level of care appropriate due to severity of illness  Dispo: The patient is from: Home              Anticipated d/c is to: SNF              Patient currently is not medically stable to d/c.   Difficult  to place patient No  Consultants:  Orthopedic surgery  Procedures: None  Antimicrobials:  Anti-infectives (From admission, onward)    None        Subjective: Seen and examined at bedside and she is groggy and confused.  Had been given morphine overnight became extremely delirious and agitated and then subsequently given Haldol.  She had just awoken and was still little confused.  She denied any complaints of pain.  No other concerns or complaints at this time. Objective: Vitals:   11/17/20 0335 11/17/20 0500 11/17/20 0600 11/17/20 0705  BP: (!) 80/60 (!) 90/43 (!) 112/42 (!) 97/58  Pulse: (!) 137 (!) 112  99  Resp: 20   20  Temp:      TempSrc:      SpO2:  95%  95%  Weight:      Height:       No intake or output data in the 24 hours ending 11/17/20 0807 Filed Weights   11/16/20 1049  Weight: 65.8 kg   Examination: Physical Exam:  Constitutional: Thin elderly Caucasian female currently resting and a little somnolent Eyes: Lids and conjunctivae normal, sclerae anicteric  ENMT: External Ears, Nose appear normal. Grossly normal hearing. Neck: Appears normal, supple, no cervical masses, normal ROM, no appreciable thyromegaly; no JVD Respiratory: Diminished to auscultation bilaterally, no wheezing, rales, rhonchi or crackles. Normal respiratory effort and patient is not tachypenic. No accessory muscle use.  Unlabored breathing Cardiovascular: RRR, no murmurs / rubs / gallops. S1 and S2 auscultated.  No appreciable extremity edema Abdomen: Soft, non-tender, non-distended. Bowel sounds positive.  GU: Deferred. Musculoskeletal: No clubbing / cyanosis of digits/nails.  Her right leg is in traction Skin: No rashes, lesions, ulcers on limited skin evaluation. No induration; Warm and dry.  Neurologic: Somnolent and Drowsy Psychiatric: Impaired judgment and insight. She is groggy and not fully Alert and oriented x 3. Normal mood and appropriate affect.   Data Reviewed: I have  personally reviewed following labs and imaging studies  CBC: Recent Labs  Lab 11/16/20 0005  WBC 7.8  NEUTROABS 5.6  HGB 13.1  HCT 40.8  MCV 93.6  PLT 316   Basic Metabolic Panel: Recent Labs  Lab 11/16/20 0005 11/17/20 0600  NA 134* 132*  K 4.2 4.3  CL 100 96*  CO2 26 29  GLUCOSE 136* 112*  BUN 21 27*  CREATININE 0.95 1.01*  CALCIUM 9.5 9.8   GFR: Estimated Creatinine Clearance: 31 mL/min (A) (by C-G formula based on SCr of 1.01 mg/dL (H)). Liver Function Tests: No results for input(s): AST, ALT, ALKPHOS, BILITOT, PROT, ALBUMIN in the last 168 hours. No results for input(s): LIPASE, AMYLASE in the last 168 hours. No results for input(s): AMMONIA in the last 168 hours. Coagulation Profile: Recent Labs  Lab 11/16/20 0005  INR 1.6*   Cardiac Enzymes: No results for input(s): CKTOTAL, CKMB, CKMBINDEX, TROPONINI in the last 168 hours. BNP (last 3 results) No results for input(s): PROBNP in the last 8760 hours. HbA1C: No results for input(s): HGBA1C in the last 72 hours. CBG: No results for input(s): GLUCAP in the last 168 hours. Lipid Profile: No results  for input(s): CHOL, HDL, LDLCALC, TRIG, CHOLHDL, LDLDIRECT in the last 72 hours. Thyroid Function Tests: No results for input(s): TSH, T4TOTAL, FREET4, T3FREE, THYROIDAB in the last 72 hours. Anemia Panel: No results for input(s): VITAMINB12, FOLATE, FERRITIN, TIBC, IRON, RETICCTPCT in the last 72 hours. Sepsis Labs: No results for input(s): PROCALCITON, LATICACIDVEN in the last 168 hours.  Recent Results (from the past 240 hour(s))  Resp Panel by RT-PCR (Flu A&B, Covid) Nasopharyngeal Swab     Status: None   Collection Time: 11/16/20  1:25 AM   Specimen: Nasopharyngeal Swab; Nasopharyngeal(NP) swabs in vial transport medium  Result Value Ref Range Status   SARS Coronavirus 2 by RT PCR NEGATIVE NEGATIVE Final    Comment: (NOTE) SARS-CoV-2 target nucleic acids are NOT DETECTED.  The SARS-CoV-2 RNA is  generally detectable in upper respiratory specimens during the acute phase of infection. The lowest concentration of SARS-CoV-2 viral copies this assay can detect is 138 copies/mL. A negative result does not preclude SARS-Cov-2 infection and should not be used as the sole basis for treatment or other patient management decisions. A negative result may occur with  improper specimen collection/handling, submission of specimen other than nasopharyngeal swab, presence of viral mutation(s) within the areas targeted by this assay, and inadequate number of viral copies(<138 copies/mL). A negative result must be combined with clinical observations, patient history, and epidemiological information. The expected result is Negative.  Fact Sheet for Patients:  BloggerCourse.com  Fact Sheet for Healthcare Providers:  SeriousBroker.it  This test is no t yet approved or cleared by the [place] FDA and  has been authorized for detection and/or diagnosis of SARS-CoV-2 by FDA under an Emergency Use Authorization (EUA). This EUA will remain  in effect (meaning this test can be used) for the duration of the COVID-19 declaration under Section 564(b)(1) of the Act, 21 U.S.C.section 360bbb-3(b)(1), unless the authorization is terminated  or revoked sooner.       Influenza A by PCR NEGATIVE NEGATIVE Final   Influenza B by PCR NEGATIVE NEGATIVE Final    Comment: (NOTE) The Xpert Xpress SARS-CoV-2/FLU/RSV plus assay is intended as an aid in the diagnosis of influenza from Nasopharyngeal swab specimens and should not be used as a sole basis for treatment. Nasal washings and aspirates are unacceptable for Xpert Xpress SARS-CoV-2/FLU/RSV testing.  Fact Sheet for Patients: BloggerCourse.com  Fact Sheet for Healthcare Providers: SeriousBroker.it  This test is not yet approved or cleared by the [place] FDA and has been authorized for detection and/or diagnosis of SARS-CoV-2 by FDA under an Emergency Use Authorization (EUA). This EUA will remain in effect (meaning this test can be used) for the duration of the COVID-19 declaration under Section 564(b)(1) of the Act, 21 U.S.C. section 360bbb-3(b)(1), unless the authorization is terminated or revoked.  Performed at [facility], [address] [place], [place] [address]   Surgical PCR screen     Status: None   Collection Time: 11/16/20 10:52 AM   Specimen: Nasal Mucosa; Nasal Swab  Result Value Ref Range Status   MRSA, PCR NEGATIVE NEGATIVE Final   Staphylococcus aureus NEGATIVE NEGATIVE Final    Comment: (NOTE) The Xpert SA Assay (FDA approved for NASAL specimens in patients 33 years of age and older), is one component of a comprehensive surveillance program. It is not intended to diagnose infection nor to guide or monitor treatment. Performed at [facility], [address], [place], [place] [address]     RN  Pressure Injury Documentation:     Estimated body mass index is 26.52 kg/m as calculated from the following:   Height as of this encounter: 5\' 2"  (1.575 m).   Weight as of this encounter: 65.8 kg.  Malnutrition Type:   Malnutrition Characteristics:   Nutrition Interventions:   Radiology Studies: DG Pelvis 1-2 Views  Result Date: 11/16/2020 CLINICAL DATA:  Fall, preop EXAM: PELVIS - 1-2 VIEW COMPARISON:  None. FINDINGS: Right hip replacement. There is a right mid femoral fracture image below the femoral stem. This is angulated. Joint space narrowing and spurring in the left hip. Calcified fibroid in the mid pelvis. IMPRESSION: Angulated mid right femoral fracture below the hip replacement stem. Electronically Signed   By: 11/18/2020 M.D.   On: 11/16/2020 00:59   DG Chest Port 1 View  Result Date: 11/16/2020 CLINICAL DATA:  Fall, preop EXAM: PORTABLE CHEST 1 VIEW COMPARISON:  06/05/2015  FINDINGS: Cardiomegaly. Aortic atherosclerosis. Lungs clear. No effusions or edema. No acute bony abnormality. IMPRESSION: Cardiomegaly.  No active disease. Electronically Signed   By: 06/07/2015 M.D.   On: 11/16/2020 00:57   DG Femur Min 2 Views Right  Result Date: 11/16/2020 CLINICAL DATA:  Fall EXAM: RIGHT FEMUR 2 VIEWS COMPARISON:  None. FINDINGS: There is a right hip replacement. Mid right femoral fracture is noted below the femoral stem with angulation, displacement and overlapping fragments. No subluxation or dislocation. IMPRESSION: Mid right femoral shaft fracture with overlapping, angulated and displaced fragments. Electronically Signed   By: 11/18/2020 M.D.   On: 11/16/2020 01:00    Scheduled Meds:  diltiazem  120 mg Oral BID   feeding supplement  237 mL Oral BID BM   levothyroxine  125 mcg Oral Q0600   metoprolol succinate  25 mg Oral BID   mirtazapine  7.5 mg Oral QHS   senna  1 tablet Oral BID   Continuous Infusions:  methocarbamol (ROBAXIN) IV      LOS: 1 day   11/18/2020, DO [facility] PAGER is on AMION  If 7PM-7AM, please contact night-coverage [email or web address]

## 2020-11-17 NOTE — Progress Notes (Signed)
   11/17/20 0500 11/17/20 0600  Assess: MEWS Score  BP (!) 90/43 (!) 112/42  Assess: MEWS Score  MEWS Temp 0 0  MEWS Systolic 1 0  MEWS Pulse 2 2  MEWS RR 0 0  MEWS LOC 0 0  MEWS Score 3 2  MEWS Score Color Yellow Yellow  Assess: if the MEWS score is Yellow or Red  Were vital signs taken at a resting state?  --  Yes  Focused Assessment  --  No change from prior assessment  Early Detection of Sepsis Score *See Row Information*  --  Low  MEWS guidelines implemented *See Row Information*  --  No, previously red, continue vital signs every 4 hours  Treat  Pain Scale  --  PAINAD  Faces Pain Scale  --  4  Take Vital Signs  Increase Vital Sign Frequency   --  Red: Q 1hr X 4 then Q 4hr X 4, if remains red, continue Q 4hrs  Escalate  MEWS: Escalate  --  Yellow: discuss with charge nurse/RN and consider discussing with provider and RRT

## 2020-11-18 ENCOUNTER — Inpatient Hospital Stay (HOSPITAL_COMMUNITY): Payer: Medicare Other

## 2020-11-18 ENCOUNTER — Encounter (HOSPITAL_COMMUNITY)
Admission: EM | Disposition: A | Discharge status: Skilled Nursing Facility | Payer: Self-pay | Source: Home / Self Care | Attending: Internal Medicine

## 2020-11-18 ENCOUNTER — Encounter (HOSPITAL_COMMUNITY): Payer: Self-pay | Admitting: Internal Medicine

## 2020-11-18 ENCOUNTER — Inpatient Hospital Stay (HOSPITAL_COMMUNITY): Payer: Medicare Other | Admitting: Certified Registered Nurse Anesthetist

## 2020-11-18 DIAGNOSIS — D649 Anemia, unspecified: Secondary | ICD-10-CM

## 2020-11-18 DIAGNOSIS — D62 Acute posthemorrhagic anemia: Secondary | ICD-10-CM

## 2020-11-18 HISTORY — PX: ORIF FEMUR FRACTURE: SHX2119

## 2020-11-18 LAB — CBC WITH DIFFERENTIAL/PLATELET
Abs Immature Granulocytes: 0.07 K/uL (ref 0.00–0.07)
Basophils Absolute: 0.1 K/uL (ref 0.0–0.1)
Basophils Relative: 0 %
Eosinophils Absolute: 0 K/uL (ref 0.0–0.5)
Eosinophils Relative: 0 %
HCT: 24.7 % — ABNORMAL LOW (ref 36.0–46.0)
Hemoglobin: 8 g/dL — ABNORMAL LOW (ref 12.0–15.0)
Immature Granulocytes: 1 %
Lymphocytes Relative: 6 %
Lymphs Abs: 0.7 K/uL (ref 0.7–4.0)
MCH: 30.2 pg (ref 26.0–34.0)
MCHC: 32.4 g/dL (ref 30.0–36.0)
MCV: 93.2 fL (ref 80.0–100.0)
Monocytes Absolute: 1.8 K/uL — ABNORMAL HIGH (ref 0.1–1.0)
Monocytes Relative: 15 %
Neutro Abs: 9.2 K/uL — ABNORMAL HIGH (ref 1.7–7.7)
Neutrophils Relative %: 78 %
Platelets: 281 K/uL (ref 150–400)
RBC: 2.65 MIL/uL — ABNORMAL LOW (ref 3.87–5.11)
RDW: 13.7 % (ref 11.5–15.5)
WBC: 11.9 K/uL — ABNORMAL HIGH (ref 4.0–10.5)
nRBC: 0 % (ref 0.0–0.2)

## 2020-11-18 LAB — COMPREHENSIVE METABOLIC PANEL
ALT: 12 U/L (ref 0–44)
AST: 27 U/L (ref 15–41)
Albumin: 3 g/dL — ABNORMAL LOW (ref 3.5–5.0)
Alkaline Phosphatase: 43 U/L (ref 38–126)
Anion gap: 6 (ref 5–15)
BUN: 14 mg/dL (ref 8–23)
CO2: 27 mmol/L (ref 22–32)
Calcium: 9 mg/dL (ref 8.9–10.3)
Chloride: 101 mmol/L (ref 98–111)
Creatinine, Ser: 0.68 mg/dL (ref 0.44–1.00)
GFR, Estimated: >60 mL/min (ref >60)
Glucose, Bld: 148 mg/dL — ABNORMAL HIGH (ref 70–99)
Potassium: 4.2 mmol/L (ref 3.5–5.1)
Sodium: 134 mmol/L — ABNORMAL LOW (ref 135–145)
Total Bilirubin: 0.7 mg/dL (ref 0.3–1.2)
Total Protein: 5.6 g/dL — ABNORMAL LOW (ref 6.5–8.1)

## 2020-11-18 LAB — PREPARE RBC (CROSSMATCH)

## 2020-11-18 LAB — MAGNESIUM: Magnesium: 1.8 mg/dL (ref 1.7–2.4)

## 2020-11-18 LAB — VITAMIN D 25 HYDROXY (VIT D DEFICIENCY, FRACTURES): Vit D, 25-Hydroxy: 32.97 ng/mL (ref 30–100)

## 2020-11-18 LAB — PHOSPHORUS: Phosphorus: 2.4 mg/dL — ABNORMAL LOW (ref 2.5–4.6)

## 2020-11-18 SURGERY — OPEN REDUCTION INTERNAL FIXATION (ORIF) DISTAL FEMUR FRACTURE
Anesthesia: General | Site: Leg Upper | Laterality: Right

## 2020-11-18 MED ORDER — SUGAMMADEX SODIUM 200 MG/2ML IV SOLN
INTRAVENOUS | Status: DC | PRN
  Administered 2020-11-18: 100 mg via INTRAVENOUS

## 2020-11-18 MED ORDER — METOCLOPRAMIDE HCL 5 MG/ML IJ SOLN: 5.0000 mg | Freq: Three times a day (TID) | INTRAMUSCULAR | Status: DC | PRN

## 2020-11-18 MED ORDER — DILTIAZEM HCL-DEXTROSE 125-5 MG/125ML-% IV SOLN (PREMIX)
INTRAVENOUS | Status: DC | PRN
  Administered 2020-11-18: 5 mg/h via INTRAVENOUS

## 2020-11-18 MED ORDER — CHLORHEXIDINE GLUCONATE 0.12 % MT SOLN
OROMUCOSAL | Status: AC
  Administered 2020-11-18: 15 mL via OROMUCOSAL
  Filled 2020-11-18: qty 15

## 2020-11-18 MED ORDER — LIDOCAINE 2% (20 MG/ML) 5 ML SYRINGE
INTRAMUSCULAR | Status: DC | PRN
  Administered 2020-11-18: 40 mg via INTRAVENOUS

## 2020-11-18 MED ORDER — FENTANYL CITRATE (PF) 250 MCG/5ML IJ SOLN
INTRAMUSCULAR | Status: AC
  Filled 2020-11-18: qty 5

## 2020-11-18 MED ORDER — ORAL CARE MOUTH RINSE: 15.0000 mL | Freq: Once | OROMUCOSAL | Status: AC

## 2020-11-18 MED ORDER — ENOXAPARIN SODIUM 30 MG/0.3ML IJ SOSY: 30.0000 mg | PREFILLED_SYRINGE | Freq: Every day | INTRAMUSCULAR | Status: DC

## 2020-11-18 MED ORDER — CHLORHEXIDINE GLUCONATE 0.12 % MT SOLN: 15.0000 mL | Freq: Once | OROMUCOSAL | Status: AC

## 2020-11-18 MED ORDER — LACTATED RINGERS IV SOLN: INTRAVENOUS | Status: DC

## 2020-11-18 MED ORDER — ROCURONIUM BROMIDE 10 MG/ML (PF) SYRINGE
PREFILLED_SYRINGE | INTRAVENOUS | Status: DC | PRN
  Administered 2020-11-18: 50 mg via INTRAVENOUS

## 2020-11-18 MED ORDER — ONDANSETRON HCL 4 MG PO TABS: 4.0000 mg | ORAL_TABLET | Freq: Four times a day (QID) | ORAL | Status: DC | PRN

## 2020-11-18 MED ORDER — ONDANSETRON HCL 4 MG/2ML IJ SOLN
INTRAMUSCULAR | Status: AC
  Filled 2020-11-18: qty 2

## 2020-11-18 MED ORDER — ONDANSETRON HCL 4 MG/2ML IJ SOLN: 4.0000 mg | Freq: Four times a day (QID) | INTRAMUSCULAR | Status: DC | PRN

## 2020-11-18 MED ORDER — POTASSIUM PHOSPHATES 15 MMOLE/5ML IV SOLN
10.0000 mmol | Freq: Once | INTRAVENOUS | Status: DC
  Filled 2020-11-18: qty 3.33

## 2020-11-18 MED ORDER — TRANEXAMIC ACID-NACL 1000-0.7 MG/100ML-% IV SOLN
1000.0000 mg | Freq: Once | INTRAVENOUS | Status: AC
Start: 2020-11-18 — End: 2020-11-18
  Administered 2020-11-18: 1000 mg via INTRAVENOUS
  Filled 2020-11-18: qty 100

## 2020-11-18 MED ORDER — DEXAMETHASONE SODIUM PHOSPHATE 10 MG/ML IJ SOLN
INTRAMUSCULAR | Status: DC | PRN
  Administered 2020-11-18: 5 mg via INTRAVENOUS

## 2020-11-18 MED ORDER — POLYETHYLENE GLYCOL 3350 17 G PO PACK: 17.0000 g | PACK | Freq: Every day | ORAL | Status: DC | PRN

## 2020-11-18 MED ORDER — ROCURONIUM BROMIDE 10 MG/ML (PF) SYRINGE
PREFILLED_SYRINGE | INTRAVENOUS | Status: AC
  Filled 2020-11-18: qty 10

## 2020-11-18 MED ORDER — PHENYLEPHRINE 40 MCG/ML (10ML) SYRINGE FOR IV PUSH (FOR BLOOD PRESSURE SUPPORT)
PREFILLED_SYRINGE | INTRAVENOUS | Status: DC | PRN
  Administered 2020-11-18: 120 ug via INTRAVENOUS
  Administered 2020-11-18: 200 ug via INTRAVENOUS

## 2020-11-18 MED ORDER — ONDANSETRON HCL 4 MG/2ML IJ SOLN
INTRAMUSCULAR | Status: DC | PRN
  Administered 2020-11-18: 4 mg via INTRAVENOUS

## 2020-11-18 MED ORDER — SODIUM CHLORIDE 0.9% IV SOLUTION: Freq: Once | INTRAVENOUS | Status: DC

## 2020-11-18 MED ORDER — VANCOMYCIN HCL 1000 MG/200ML IV SOLN
1000.0000 mg | Freq: Two times a day (BID) | INTRAVENOUS | Status: AC
  Administered 2020-11-18: 1000 mg via INTRAVENOUS
  Filled 2020-11-18: qty 200

## 2020-11-18 MED ORDER — PHENYLEPHRINE HCL-NACL 10-0.9 MG/250ML-% IV SOLN
INTRAVENOUS | Status: DC | PRN
  Administered 2020-11-18: 50 ug/min via INTRAVENOUS

## 2020-11-18 MED ORDER — METOPROLOL TARTRATE 5 MG/5ML IV SOLN
INTRAVENOUS | Status: DC | PRN
  Administered 2020-11-18 (×2): 2.5 mg via INTRAVENOUS

## 2020-11-18 MED ORDER — VANCOMYCIN HCL 1000 MG IV SOLR
INTRAVENOUS | Status: DC | PRN
  Administered 2020-11-18: 1000 mg via TOPICAL

## 2020-11-18 MED ORDER — DOCUSATE SODIUM 100 MG PO CAPS
100.0000 mg | ORAL_CAPSULE | Freq: Two times a day (BID) | ORAL | Status: DC
  Administered 2020-11-18 – 2020-11-24 (×8): 100 mg via ORAL
  Filled 2020-11-18 (×8): qty 1

## 2020-11-18 MED ORDER — MAGNESIUM SULFATE 2 GM/50ML IV SOLN
2.0000 g | Freq: Once | INTRAVENOUS | Status: DC
  Filled 2020-11-18: qty 50

## 2020-11-18 MED ORDER — FENTANYL CITRATE (PF) 100 MCG/2ML IJ SOLN: 25.0000 ug | INTRAMUSCULAR | Status: DC | PRN

## 2020-11-18 MED ORDER — 0.9 % SODIUM CHLORIDE (POUR BTL) OPTIME
TOPICAL | Status: DC | PRN
  Administered 2020-11-18: 1000 mL

## 2020-11-18 MED ORDER — DILTIAZEM LOAD VIA INFUSION
INTRAVENOUS | Status: DC | PRN
  Administered 2020-11-18 (×2): 5 mg via INTRAVENOUS

## 2020-11-18 MED ORDER — PROPOFOL 10 MG/ML IV BOLUS
INTRAVENOUS | Status: DC | PRN
  Administered 2020-11-18: 50 mg via INTRAVENOUS

## 2020-11-18 MED ORDER — LIDOCAINE 2% (20 MG/ML) 5 ML SYRINGE
INTRAMUSCULAR | Status: AC
  Filled 2020-11-18: qty 5

## 2020-11-18 MED ORDER — METOCLOPRAMIDE HCL 5 MG PO TABS: 5.0000 mg | ORAL_TABLET | Freq: Three times a day (TID) | ORAL | Status: DC | PRN

## 2020-11-18 MED ORDER — PROPOFOL 10 MG/ML IV BOLUS
INTRAVENOUS | Status: AC
  Filled 2020-11-18: qty 20

## 2020-11-18 MED ORDER — VANCOMYCIN HCL 1000 MG IV SOLR
INTRAVENOUS | Status: AC
  Filled 2020-11-18: qty 1000

## 2020-11-18 MED ORDER — FENTANYL CITRATE (PF) 100 MCG/2ML IJ SOLN
INTRAMUSCULAR | Status: DC | PRN
  Administered 2020-11-18: 25 ug via INTRAVENOUS
  Administered 2020-11-18: 50 ug via INTRAVENOUS
  Administered 2020-11-18: 25 ug via INTRAVENOUS
  Administered 2020-11-18: 50 ug via INTRAVENOUS

## 2020-11-18 MED ORDER — DEXAMETHASONE SODIUM PHOSPHATE 10 MG/ML IJ SOLN
INTRAMUSCULAR | Status: AC
  Filled 2020-11-18: qty 1

## 2020-11-18 SURGICAL SUPPLY — 74 items
BAG COUNTER SPONGE SURGICOUNT (BAG) ×2 IMPLANT
BAG SPNG CNTER NS LX DISP (BAG) ×1
BIT DRILL 4.3 (BIT) ×2
BIT DRILL 4.3X300MM (BIT) IMPLANT
BIT DRILL LONG 3.3 (BIT) ×2 IMPLANT
BIT DRILL QC 3.3X195 (BIT) ×1 IMPLANT
BNDG COHESIVE 6X5 TAN STRL LF (GAUZE/BANDAGES/DRESSINGS) ×2 IMPLANT
BNDG ELASTIC 6X10 VLCR STRL LF (GAUZE/BANDAGES/DRESSINGS) ×2 IMPLANT
BNDG ELASTIC 6X15 VLCR STRL LF (GAUZE/BANDAGES/DRESSINGS) ×1 IMPLANT
BRUSH SCRUB EZ  4% CHG (MISCELLANEOUS) ×2
BRUSH SCRUB EZ 4% CHG (MISCELLANEOUS) IMPLANT
CABLE CERLAGE W/CRIMP 1.8 (Cable) ×2 IMPLANT
CAP LOCK NCB (Cap) ×10 IMPLANT
CHLORAPREP W/TINT 26 (MISCELLANEOUS) ×2 IMPLANT
COVER SURGICAL LIGHT HANDLE (MISCELLANEOUS) ×2 IMPLANT
DERMABOND ADVANCED (GAUZE/BANDAGES/DRESSINGS) ×2
DERMABOND ADVANCED .7 DNX12 (GAUZE/BANDAGES/DRESSINGS) IMPLANT
DRAPE C-ARM 42X72 X-RAY (DRAPES) ×2 IMPLANT
DRAPE C-ARMOR (DRAPES) ×2 IMPLANT
DRAPE HALF SHEET 40X57 (DRAPES) ×4 IMPLANT
DRAPE ORTHO SPLIT 77X108 STRL (DRAPES) ×4
DRAPE SURG 17X23 STRL (DRAPES) ×2 IMPLANT
DRAPE SURG ORHT 6 SPLT 77X108 (DRAPES) ×2 IMPLANT
DRAPE U-SHAPE 47X51 STRL (DRAPES) ×2 IMPLANT
DRSG ADAPTIC 3X8 NADH LF (GAUZE/BANDAGES/DRESSINGS) IMPLANT
DRSG MEPILEX BORDER 4X12 (GAUZE/BANDAGES/DRESSINGS) ×1 IMPLANT
DRSG MEPILEX BORDER 4X4 (GAUZE/BANDAGES/DRESSINGS) IMPLANT
DRSG MEPILEX BORDER 4X8 (GAUZE/BANDAGES/DRESSINGS) ×1 IMPLANT
DRSG PAD ABDOMINAL 8X10 ST (GAUZE/BANDAGES/DRESSINGS) ×3 IMPLANT
ELECT REM PT RETURN 9FT ADLT (ELECTROSURGICAL) ×2
ELECTRODE REM PT RTRN 9FT ADLT (ELECTROSURGICAL) ×1 IMPLANT
GAUZE SPONGE 4X4 12PLY STRL (GAUZE/BANDAGES/DRESSINGS) ×2 IMPLANT
GLOVE SURG ENC MOIS LTX SZ6.5 (GLOVE) ×6 IMPLANT
GLOVE SURG ENC MOIS LTX SZ7.5 (GLOVE) ×8 IMPLANT
GLOVE SURG UNDER POLY LF SZ6.5 (GLOVE) ×2 IMPLANT
GLOVE SURG UNDER POLY LF SZ7.5 (GLOVE) ×2 IMPLANT
GOWN STRL REUS W/ TWL LRG LVL3 (GOWN DISPOSABLE) ×3 IMPLANT
GOWN STRL REUS W/TWL LRG LVL3 (GOWN DISPOSABLE) ×6
K-WIRE 2.0 (WIRE) ×2
K-WIRE FXSTD 280X2XNS SS (WIRE) ×1
KIT BASIN OR (CUSTOM PROCEDURE TRAY) ×2 IMPLANT
KIT TURNOVER KIT B (KITS) ×2 IMPLANT
KWIRE FXSTD 280X2XNS SS (WIRE) IMPLANT
MANIFOLD NEPTUNE II (INSTRUMENTS) ×1 IMPLANT
NS IRRIG 1000ML POUR BTL (IV SOLUTION) ×2 IMPLANT
PACK TOTAL JOINT (CUSTOM PROCEDURE TRAY) ×2 IMPLANT
PAD ARMBOARD 7.5X6 YLW CONV (MISCELLANEOUS) ×4 IMPLANT
PAD CAST 4YDX4 CTTN HI CHSV (CAST SUPPLIES) ×1 IMPLANT
PADDING CAST COTTON 4X4 STRL (CAST SUPPLIES) ×2
PADDING CAST COTTON 6X4 STRL (CAST SUPPLIES) ×2 IMPLANT
PLATE RT DISTAL FEMUR NCB 18H (Plate) ×1 IMPLANT
SCREW 5.0 70MM (Screw) ×1 IMPLANT
SCREW 5.0 80MM (Screw) ×1 IMPLANT
SCREW CORTICAL NCB 5.0X40 (Screw) ×1 IMPLANT
SCREW NCB 3.5X75X5X6.2XST (Screw) IMPLANT
SCREW NCB 4.0MX38M (Screw) ×2 IMPLANT
SCREW NCB 4.0MX44M (Screw) ×1 IMPLANT
SCREW NCB 4.0X36MM (Screw) ×1 IMPLANT
SCREW NCB 5.0X75MM (Screw) ×4 IMPLANT
SCREW NCB 5.0X85MM (Screw) ×1 IMPLANT
SLEEVE SCD COMPRESS KNEE MED (STOCKING) ×1 IMPLANT
SPONGE T-LAP 18X18 ~~LOC~~+RFID (SPONGE) ×3 IMPLANT
STAPLER VISISTAT 35W (STAPLE) ×2 IMPLANT
SUCTION FRAZIER HANDLE 10FR (MISCELLANEOUS) ×2
SUCTION TUBE FRAZIER 10FR DISP (MISCELLANEOUS) ×1 IMPLANT
SUT ETHILON 3 0 PS 1 (SUTURE) ×4 IMPLANT
SUT VIC AB 0 CT1 27 (SUTURE)
SUT VIC AB 0 CT1 27XBRD ANBCTR (SUTURE) IMPLANT
SUT VIC AB 1 CT1 27 (SUTURE)
SUT VIC AB 1 CT1 27XBRD ANBCTR (SUTURE) IMPLANT
SUT VIC AB 2-0 CT1 27 (SUTURE) ×4
SUT VIC AB 2-0 CT1 TAPERPNT 27 (SUTURE) ×2 IMPLANT
TOWEL GREEN STERILE (TOWEL DISPOSABLE) ×4 IMPLANT
WATER STERILE IRR 1000ML POUR (IV SOLUTION) ×3 IMPLANT

## 2020-11-18 NOTE — Anesthesia Preprocedure Evaluation (Addendum)
Anesthesia Evaluation  Patient identified by MRN, date of birth, ID band Patient confused    Reviewed: Allergy & Precautions, NPO status , Patient's Chart, lab work & pertinent test results, reviewed documented beta blocker date and time   Airway Mallampati: II  TM Distance: >3 FB Neck ROM: Full    Dental  (+) Edentulous Upper, Edentulous Lower, Dental Advisory Given   Pulmonary COPD, former smoker (65 pack year smoking history),    Pulmonary exam normal breath sounds clear to auscultation       Cardiovascular + dysrhythmias (on eliquis) Atrial Fibrillation  Rhythm:Irregular Rate:Tachycardia  TEE 2017 - Left ventricle: The cavity size was normal. Wall thickness was  normal. Systolic function was normal. The estimated ejection  fraction was in the range of 55% to 60%.  - Aortic valve: There was trivial regurgitation.  - Mitral valve: There was mild regurgitation.  - Left atrium: No evidence of thrombus in the atrial cavity or appendage. No evidence of thrombus in the appendage.  - Right atrium: No evidence of thrombus in the atrial cavity or appendage.  - Tricuspid valve: There was moderate regurgitation.   Neuro/Psych negative neurological ROS  negative psych ROS   GI/Hepatic negative GI ROS, Neg liver ROS,   Endo/Other  Hypothyroidism   Renal/GU negative Renal ROS  negative genitourinary   Musculoskeletal  (+) Arthritis ,   Abdominal   Peds  Hematology  (+) Blood dyscrasia (Hgb 8.0), anemia ,   Anesthesia Other Findings   Reproductive/Obstetrics                           Anesthesia Physical Anesthesia Plan  ASA: 3  Anesthesia Plan: General   Post-op Pain Management:    Induction: Intravenous  PONV Risk Score and Plan: 3 and Dexamethasone, Ondansetron and Treatment may vary due to age or medical condition  Airway Management Planned: Oral ETT  Additional Equipment: Arterial  line  Intra-op Plan:   Post-operative Plan: Extubation in OR and Possible Post-op intubation/ventilation  Informed Consent: I have reviewed the patients History and Physical, chart, labs and discussed the procedure including the risks, benefits and alternatives for the proposed anesthesia with the patient or authorized representative who has indicated his/her understanding and acceptance.   Patient has DNR.  Discussed DNR with patient and Suspend DNR.   Dental advisory given  Plan Discussed with: CRNA  Anesthesia Plan Comments:       Anesthesia Quick Evaluation

## 2020-11-18 NOTE — Plan of Care (Signed)
  Problem: Education: Goal: Knowledge of General Education information will improve Description: Including pain rating scale, medication(s)/side effects and non-pharmacologic comfort measures Outcome: Not Progressing   Problem: Health Behavior/Discharge Planning: Goal: Ability to manage health-related needs will improve Outcome: Not Progressing   Problem: Activity: Goal: Risk for activity intolerance will decrease Outcome: Not Progressing   Problem: Nutrition: Goal: Adequate nutrition will be maintained Outcome: Not Progressing   Problem: Coping: Goal: Level of anxiety will decrease Outcome: Not Progressing   Problem: Safety: Goal: Ability to remain free from injury will improve Outcome: Not Progressing   

## 2020-11-18 NOTE — Interval H&P Note (Signed)
History and Physical Interval Note:  11/18/2020 12:12 PM  Katrina Velez  has presented today for surgery, with the diagnosis of R femur Fracture.  The various methods of treatment have been discussed with the patient and family. After consideration of risks, benefits and other options for treatment, the patient has consented to  Procedure(s): OPEN REDUCTION INTERNAL FIXATION (ORIF) DISTAL FEMUR FRACTURE (Right) as a surgical intervention.  The patient's history has been reviewed, patient examined, no change in status, stable for surgery.  I have reviewed the patient's chart and labs.  Questions were answered to the patient's satisfaction.     Caryn Bee P Etherine Mackowiak

## 2020-11-18 NOTE — Anesthesia Procedure Notes (Signed)
Procedure Name: Intubation Date/Time: 11/18/2020 1:20 PM Performed by: Moshe Salisbury, CRNA Pre-anesthesia Checklist: Patient identified, Emergency Drugs available, Suction available and Patient being monitored Patient Re-evaluated:Patient Re-evaluated prior to induction Oxygen Delivery Method: Circle System Utilized Preoxygenation: Pre-oxygenation with 100% oxygen Induction Type: IV induction Ventilation: Mask ventilation without difficulty Laryngoscope Size: Mac and 3 Grade View: Grade I Tube type: Oral Tube size: 7.5 mm Number of attempts: 1 Airway Equipment and Method: Stylet and Oral airway Placement Confirmation: ETT inserted through vocal cords under direct vision, positive ETCO2 and breath sounds checked- equal and bilateral Secured at: 19 cm Tube secured with: Tape Dental Injury: Teeth and Oropharynx as per pre-operative assessment

## 2020-11-18 NOTE — Progress Notes (Signed)
PROGRESS NOTE    Katrina Velez  NWG:[id number] DOB: 1926-03-18 DOA: 11/15/2020 PCP: System, Provider Not In   Brief Narrative:  The patient is a 87 year old elderly Caucasian female with a past medical history significant for but not limited to permanent atrial fibrillation on anticoagulation with apixaban, hypothyroidism, osteoarthritis, depression, spinal stenosis of his lumbar spine with neurogenic claudication as well as other comorbidities presented to the ED via EMS complaining of right thigh pain after mechanical fall.  She had no significant abnormalities noted on her laboratory work.  She screened negative for COVID.  X-ray did of the hip showed mild right femoral shaft fracture with overlapping angulated and displaced fragments.  Chest x-ray showed no active disease and the EDP spoke to the orthopedic surgeon who consulted and are planning for surgical intervention on Thursday.  Patient was walking from her bedroom to the kitchen to take her evening medications and slept and fell down to the floor.  She denied hitting her head or loss of consciousness.  She did take Eliquis the night that she fell.  Overnight she became extremely combative and agitated after administration of IV morphine.  She was subsequently given IV Haldol given the agitation.  Yesterday morning she is a little groggy and a little confused and today she is more awake   Assessment & Plan:   Principal Problem:   Femur fracture (HCC) Active Problems:   Hypothyroidism post removal of goiter   Atrial fibrillation (HCC)  Right Femur Fracture -In the setting of Fall -No concern for Syncopal Episode -Orthopedic Surgery Consulted with plans for Surgical Intervention today given Eliquis Use -Judicious use of Narcotics -VTE Prophylaxis with Enoxaparin 30 mg sq q24h -Ortho Recommendind PWB RLE 50% and recommending that the Dressings -PT/OT to evaluate and Treat  -Pain Control with Acetaminophen 650 mg po q4hprn Mild Pain,  Hydrocodone-Acetaminophen 5-325 mg po q4hprn Severe Pain and Tramadol 50 mg po q6hprn Moderate Pain, WILL NOT USE ANYMORE IV Morphine given her Agitation and combativeness -C/w Robaxin 500 mg po/IV q6hprn Muscle Spasms -Gentle IVF Hydration with D5 NS a  -Bowel Regimen with Senna 8.6 mg po 1 tab BID  -C/w Supportive Care  Permanent Atrial Fibrillation -Hold Eliquis (Takes 5 mg po BID but will likley need to reduce to 2.5 mg po BID when re-ordered) -C/w with Diltaizem 120 mg po BID and Metoprolol Succinate 25 mg po BID -INR was 1.6 -Continue to Monitor on Telemetry  Iatrogenic Hypothyroidism (Post Removal of Goiter) -Check TSH -C/w Levothyroxine 125 mcg po Daily   Hyponatremia -Mild. Na+ went from 134 -> 132 -> 134 -Started D5 NS at 75 mL/hr and will continue  -Continue to Monitor and Trend -Repeat CMP in the AM   Acute Agitation and Confusion, improved  -Had significant aggression towards the staff and removed IV -In the setting of Narcotics, specifically IV Morphine 0.5 mg administration -Daughter states the patient had a similar episode 30 years ago after morphine -STOP Morphine -Given 2 mg of IV Haldol overnight -Delirium Precautions  CKD Stage 3a -BUN/Cr went from 21/0.95 -> 27/1.01 -> 14/0.68 -IVF as above -Avoid Further Nephrotoxic Medications, Contrast Dyes, and Hypotension and Renally adjust Medications -Repeat CMP in the AM   Normocytic Anemia -Patient was likely Hemoconcentrated on Admission -Hgb/Hct went from 13.1/40.8 -> 8.0/24.7 and ? Dilutional Drop; Expect to Drop further -Check Anemia Panel in the AM -Continue to Monitor for S/Sx of Bleeding; No overt bleeding noted -Repeat CBC in the AM   Leukocytosis -Likely  Reactive in the setting of Fall and Pain -WBC went from 7.8 -> 11.9 -Continue to Monitor for S/Sx of Infection -Currently Afebrile -Repeat WBC in the AM   Hypophosphatemia -Mild. Phos Level was 2.4 -Replete with IV K Phos 10 mmol -Continue to  Monitor and Replete as Necessary -Repeat Phos Level in the AM  DVT prophylaxis: Enoxaparin 30 mg sq q24h Code Status: FULL CODE  Family Communication: Discussed with Daughter at bedside  Disposition Plan: Pending further clinical improvement and clearance by orthopedic surgery wants to do the surgery on Thursday  Status is: Inpatient  Remains inpatient appropriate because:Unsafe d/c plan, IV treatments appropriate due to intensity of illness or inability to take PO, and Inpatient level of care appropriate due to severity of illness  Dispo: The patient is from: Home              Anticipated d/c is to: SNF              Patient currently is not medically stable to d/c.   Difficult to place patient No  Consultants:  Orthopedic surgery  Procedures: None  Antimicrobials:  Anti-infectives (From admission, onward)    Start     Dose/Rate Route Frequency Ordered Stop   11/18/20 1248  vancomycin (VANCOCIN) powder          As needed 11/18/20 1249     11/18/20 1100  vancomycin (VANCOCIN) IVPB 1000 mg/200 mL premix        1,000 mg 200 mL/hr over 60 Minutes Intravenous On call to O.R. 11/17/20 2136 11/19/20 0559        Subjective: Seen and examined at bedside and is a little confused but not as much as yesterday.  Much more awake and alert and responsive.  She is hard of hearing.  Denies any concerns or pain at this time.  States that she is hungry.  Objective: Vitals:   11/18/20 0400 11/18/20 0700 11/18/20 1210 11/18/20 1248  BP: 101/61 (!) 148/67 (!) 125/43 (!) 109/41  Pulse: (!) 130 (!) 122 60 92  Resp: 16 19 17  (!) 24  Temp: 98.4 F (36.9 C)  98.6 F (37 C)   TempSrc:   Oral   SpO2: 94%  97% 95%  Weight:   65.8 kg   Height:   5\' 2"  (1.575 m)     Intake/Output Summary (Last 24 hours) at 11/18/2020 1339 Last data filed at 11/17/2020 1649 Gross per 24 hour  Intake 362.68 ml  Output 200 ml  Net 162.68 ml   Filed Weights   11/16/20 1049 11/18/20 1210  Weight: 65.8 kg 65.8  kg   Examination: Physical Exam:  Constitutional: Thin elderly Caucasian female currently in NAD and appears calm and comfortable Eyes: Lids and conjunctivae normal, sclerae anicteric  ENMT: External Ears, Nose appear normal. Hard of Hearing  Neck: Appears normal, supple, no cervical masses, normal ROM, no appreciable thyromegaly; no JVD Respiratory: Diminished to auscultation bilaterally, no wheezing, rales, rhonchi or crackles. Normal respiratory effort and patient is not tachypenic. No accessory muscle use. Unlabored breathing  Cardiovascular: RRR, no murmurs / rubs / gallops. S1 and S2 auscultated. No extremity edema.  Abdomen: Soft, non-tender, non-distended. Bowel sounds positive.  GU: Deferred. Musculoskeletal: No clubbing / cyanosis of digits/nails. Right Leg is in Traction Skin: No rashes, lesions, ulcers on a limited skin evaluation. No induration; Warm and dry.  Neurologic: CN 2-12 grossly intact with no focal deficits. Romberg sign and cerebellar reflexes not assessed.  Psychiatric: Normal  judgment and insight. More awake and Alert and oriented x 2. Normal mood and appropriate affect.   Data Reviewed: I have personally reviewed following labs and imaging studies  CBC: Recent Labs  Lab 11/16/20 0005 11/18/20 0950  WBC 7.8 11.9*  NEUTROABS 5.6 9.2*  HGB 13.1 8.0*  HCT 40.8 24.7*  MCV 93.6 93.2  PLT 316 281    Basic Metabolic Panel: Recent Labs  Lab 11/16/20 0005 11/17/20 0600 11/18/20 0950  NA 134* 132* 134*  K 4.2 4.3 4.2  CL 100 96* 101  CO2 26 29 27   GLUCOSE 136* 112* 148*  BUN 21 27* 14  CREATININE 0.95 1.01* 0.68  CALCIUM 9.5 9.8 9.0  MG  --   --  1.8  PHOS  --   --  2.4*    GFR: Estimated Creatinine Clearance: 39.1 mL/min (by C-G formula based on SCr of 0.68 mg/dL). Liver Function Tests: Recent Labs  Lab 11/18/20 0950  AST 27  ALT 12  ALKPHOS 43  BILITOT 0.7  PROT 5.6*  ALBUMIN 3.0*   No results for input(s): LIPASE, AMYLASE in the last  168 hours. No results for input(s): AMMONIA in the last 168 hours. Coagulation Profile: Recent Labs  Lab 11/16/20 0005  INR 1.6*    Cardiac Enzymes: No results for input(s): CKTOTAL, CKMB, CKMBINDEX, TROPONINI in the last 168 hours. BNP (last 3 results) No results for input(s): PROBNP in the last 8760 hours. HbA1C: No results for input(s): HGBA1C in the last 72 hours. CBG: No results for input(s): GLUCAP in the last 168 hours. Lipid Profile: No results for input(s): CHOL, HDL, LDLCALC, TRIG, CHOLHDL, LDLDIRECT in the last 72 hours. Thyroid Function Tests: No results for input(s): TSH, T4TOTAL, FREET4, T3FREE, THYROIDAB in the last 72 hours. Anemia Panel: No results for input(s): VITAMINB12, FOLATE, FERRITIN, TIBC, IRON, RETICCTPCT in the last 72 hours. Sepsis Labs: No results for input(s): PROCALCITON, LATICACIDVEN in the last 168 hours.  Recent Results (from the past 240 hour(s))  Resp Panel by RT-PCR (Flu A&B, Covid) Nasopharyngeal Swab     Status: None   Collection Time: 11/16/20  1:25 AM   Specimen: Nasopharyngeal Swab; Nasopharyngeal(NP) swabs in vial transport medium  Result Value Ref Range Status   SARS Coronavirus 2 by RT PCR NEGATIVE NEGATIVE Final    Comment: (NOTE) SARS-CoV-2 target nucleic acids are NOT DETECTED.  The SARS-CoV-2 RNA is generally detectable in upper respiratory specimens during the acute phase of infection. The lowest concentration of SARS-CoV-2 viral copies this assay can detect is 138 copies/mL. A negative result does not preclude SARS-Cov-2 infection and should not be used as the sole basis for treatment or other patient management decisions. A negative result may occur with  improper specimen collection/handling, submission of specimen other than nasopharyngeal swab, presence of viral mutation(s) within the areas targeted by this assay, and inadequate number of viral copies(<138 copies/mL). A negative result must be combined with clinical  observations, patient history, and epidemiological information. The expected result is Negative.  Fact Sheet for Patients:  11/18/20  Fact Sheet for Healthcare Providers:  BloggerCourse.com  This test is no t yet approved or cleared by the [place] FDA and  has been authorized for detection and/or diagnosis of SARS-CoV-2 by FDA under an Emergency Use Authorization (EUA). This EUA will remain  in effect (meaning this test can be used) for the duration of the COVID-19 declaration under Section 564(b)(1) of the Act, 21 U.S.C.section 360bbb-3(b)(1), unless the authorization is terminated  or revoked sooner.       Influenza A by PCR NEGATIVE NEGATIVE Final   Influenza B by PCR NEGATIVE NEGATIVE Final    Comment: (NOTE) The Xpert Xpress SARS-CoV-2/FLU/RSV plus assay is intended as an aid in the diagnosis of influenza from Nasopharyngeal swab specimens and should not be used as a sole basis for treatment. Nasal washings and aspirates are unacceptable for Xpert Xpress SARS-CoV-2/FLU/RSV testing.  Fact Sheet for Patients: BloggerCourse.com  Fact Sheet for Healthcare Providers: SeriousBroker.it  This test is not yet approved or cleared by the [place] FDA and has been authorized for detection and/or diagnosis of SARS-CoV-2 by FDA under an Emergency Use Authorization (EUA). This EUA will remain in effect (meaning this test can be used) for the duration of the COVID-19 declaration under Section 564(b)(1) of the Act, 21 U.S.C. section 360bbb-3(b)(1), unless the authorization is terminated or revoked.  Performed at [facility], [address], [place], [place] [address]   Surgical PCR screen     Status: None   Collection Time: 11/16/20 10:52 AM   Specimen: Nasal Mucosa; Nasal Swab  Result Value Ref Range Status   MRSA, PCR NEGATIVE NEGATIVE Final    Staphylococcus aureus NEGATIVE NEGATIVE Final    Comment: (NOTE) The Xpert SA Assay (FDA approved for NASAL specimens in patients 36 years of age and older), is one component of a comprehensive surveillance program. It is not intended to diagnose infection nor to guide or monitor treatment. Performed at [facility], [address] [place], [place] [address]      RN Pressure Injury Documentation:     Estimated body mass index is 26.53 kg/m as calculated from the following:   Height as of this encounter: 5\' 2"  (1.575 m).   Weight as of this encounter: 65.8 kg.  Malnutrition Type: Nutrition Problem: Inadequate oral intake Etiology: decreased appetite Malnutrition Characteristics: Signs/Symptoms: per patient/family report Nutrition Interventions: Interventions: Boost Plus, MVI Radiology Studies: No results found.  Scheduled Meds:  sodium chloride   Intravenous Once   sodium chloride   Intravenous Once   chlorhexidine  60 mL Topical Once   [MAR Hold] diltiazem  120 mg Oral BID   [MAR Hold] enoxaparin (LOVENOX) injection  30 mg Subcutaneous Daily   [MAR Hold] lactose free nutrition  237 mL Oral TID WC   [MAR Hold] levothyroxine  125 mcg Oral Q0600   [MAR Hold] metoprolol succinate  25 mg Oral BID   [MAR Hold] mirtazapine  7.5 mg Oral QHS   [MAR Hold] multivitamin with minerals  1 tablet Oral Daily   [MAR Hold] senna  1 tablet Oral BID   Continuous Infusions:  dextrose 5 % and 0.9 % NaCl with KCl 20 mEq/L 75 mL/hr at 11/17/20 2235   lactated ringers 10 mL/hr at 11/18/20 1250   [MAR Hold] methocarbamol (ROBAXIN) IV     vancomycin 1,000 mg (11/18/20 1250)    LOS: 2 days   11/20/20, DO [facility] PAGER is on AMION  If 7PM-7AM, please contact night-coverage [email or web address]

## 2020-11-18 NOTE — Anesthesia Procedure Notes (Signed)
Arterial Line Insertion Start/End6/30/2022 1:25 PM, 11/18/2020 1:30 PM Performed by: Elmer Picker, MD, anesthesiologist  Patient location: OR. Preanesthetic checklist: patient identified, IV checked, site marked, risks and benefits discussed, surgical consent, monitors and equipment checked, pre-op evaluation, timeout performed and anesthesia consent Right, radial was placed Catheter size: 20 G Hand hygiene performed , maximum sterile barriers used  and Seldinger technique used  Attempts: 1 Procedure performed without using ultrasound guided technique. Following insertion, dressing applied and Biopatch. Post procedure assessment: normal  Patient tolerated the procedure well with no immediate complications.

## 2020-11-18 NOTE — Progress Notes (Signed)
Updated MD. No new orders. Will follow yellow MEWS protocol at this time.      11/17/20 2357  Vitals  Temp 98.4 F (36.9 C)  Temp Source Oral  BP 108/60  MAP (mmHg) 75  BP Location Left Arm  BP Method Automatic  Patient Position (if appropriate) Lying  Pulse Rate (!) 118  Pulse Rate Source Dinamap  Resp 16  Level of Consciousness  Level of Consciousness Alert  MEWS COLOR  MEWS Score Color Yellow  Oxygen Therapy  SpO2 95 %  O2 Device Nasal Cannula  O2 Flow Rate (L/min) 2 L/min  Pain Assessment  Pain Scale 0-10  Pain Score 0  Pain Type Acute pain  Pain Location Leg  Pain Orientation Right  Pain Descriptors / Indicators Throbbing  Pain Frequency Intermittent  Pain Onset Gradual  Pain Intervention(s) MD notified (Comment)  Multiple Pain Sites No  PCA/Epidural/Spinal Assessment  Respiratory Pattern Regular  Glasgow Coma Scale  Eye Opening 4  Best Verbal Response (NON-intubated) 4  Best Motor Response 6  Glasgow Coma Scale Score 14  MEWS Score  MEWS Temp 0  MEWS Systolic 0  MEWS Pulse 2  MEWS RR 0  MEWS LOC 0  MEWS Score 2  Provider Notification  Provider Name/Title Garner Nash APP  Date Provider Notified 11/18/20  Time Provider Notified 0001  Notification Type Page  Notification Reason Other (Comment)  Provider response Other (Comment)  Date of Provider Response 11/18/20  Time of Provider Response [phone]

## 2020-11-18 NOTE — Op Note (Signed)
Orthopaedic Surgery Operative Note (CSN: [id number] ) Date of Surgery: 11/18/2020  Admit Date: 11/15/2020   Diagnoses: Pre-Op Diagnoses: Right periprosthetic femoral shaft and distal femur fracture  Post-Op Diagnosis: Same  Procedures: CPT 27511-Open reduction internal fixation of right distal femur fracture CPT 27507-Open reduction internal fixation of right femoral shaft  Surgeons : Primary: Roby Lofts, MD  Assistant: Ulyses Southward, PA-C  Location: OR 6   Anesthesia:General   Antibiotics: Vancomycin IV preop and 1 gm vancomycin powder  Tourniquet time:None    Estimated Blood Loss:400 mL  Complications:None   Specimens:None   Implants: Implant Name Type Inv. Item Serial No. Manufacturer Lot No. LRB No. Used Action  CAP LOCK NCB - [id number] Cap CAP LOCK NCB  [employer]IMMER [employer](ORTH,TRAU,BIO,SG)  Right 1 Implanted  Cerclage Cable with Crimp 1.6mm diameter 25 inch length    [employer] [id number] Right 1 Implanted  Cerclage Cable with Crimp 1.73mm diameter and 25 inch length    [employer] [id number] Right 1 Implanted  PLATE RT DISTAL FEMUR NCB 18H - [id number] Plate PLATE RT DISTAL FEMUR NCB 18H  Z[employer]MER [employer](ORTH,TRAU,BIO,SG)  Right 1 Implanted  SCREW NCB 5.0X75MM - [id number] Screw SCREW NCB 5.0X75MM  [employer](ORTH,TRAU,BIO,SG)  Right 2 Implanted  SCREW CORTICAL NCB 5.0X40 - [id number] Screw SCREW CORTICAL NCB 5.0X40  Z[employer]MER [employer]ON(ORTH,TRAU,BIO,SG)  Right 1 Implanted  SCREW 5.0 - [id number] Screw SCREW 5.0  ZIMMER [employer]ON(ORTH,TRAU,BIO,SG)  Right 1 Implanted  SCREW 5.0 - [id number] Screw SCREW 5.0  Z[employer]MER [employer]ON(ORTH,TRAU,BIO,SG)  Right 1 Implanted     Indications for Surgery: 87 year old female who sustained a right endoprosthetic femoral shaft fracture with an associated nondisplaced distal femur fracture.  Due to the unstable nature of her injuries I recommended proceeding with open reduction internal fixation.  Risks and benefits were  discussed with the patient's daughter.  Risks included but not limited to bleeding, infection, malunion, nonunion, hardware failure, hardware irritation, nerve or blood vessel injury, DVT, even the possibility anesthetic complications.  The patient agreed to proceed with surgery and consent was obtained.  Operative Findings: Open reduction internal fixation of right femoral shaft fracture and right distal femur fracture using [employer] cables around the spiral oblique femoral shaft component x2 and a 18 hole [employer] NCB distal femoral locking plate.  Procedure: The patient was identified in the preoperative holding area. Consent was confirmed with the patient and their family and all questions were answered. The operative extremity was marked after confirmation with the patient. she was then brought back to the operating room by our anesthesia colleagues.  She was placed under general anesthetic and carefully transferred over to a radiolucent flat top table.  A bump was placed under her operative hip.  The right lower extremity was prepped and draped in usual sterile fashion.  A timeout was performed verifying patient, the procedure, and extremity.  Preoperative antibiotics were dosed.  Fluoroscopic imaging showed the unstable nature of her injury.  The hip and knee were flexed over a triangle.  A lateral approach to the distal femur was carried down through skin and subcutaneous tissue.  I incised through the IT band and proceeded to release the vastus lateralis off of the intermuscular septum.  I continued the release and cauterizing the perforators until I reached the fracture site.  At this point I cleared out the hematoma that was in place.  I used reduction tenaculums and reduction clamps to assist with reducing the fracture in  near anatomic position.  I confirmed this with fluoroscopy.  Once I had near anatomic reduction I then proceeded to place provisional fixation with cables.  Using a  cable passer I was able to pass 2 cables around the femur and the location of the spiral oblique femoral shaft fracture.  I then attention these cables to provide provisional reduction and remove the clamps.  Due to the nondisplaced distal femur fracture I decided to place a 18 hole [employer] NCB distal femoral locking plate.  I attached to the targeting arm and slid it submuscularly along the lateral cortex of the femur.  A K wire was used distally to hold the position of the plate.  I then used percutaneous incision to place a 3.3 mm drill bit just distal to the femoral prosthesis.  Once I confirmed adequate positioning of the plate I then placed 5.0 millimeter screws distally to bring the plate flush to bone.  I then placed a 5.0 millimeter screw in the femoral shaft proximal to the fracture to bring the plate flush to bone.  I then was able to use the targeting arm to place 4.0 millimeter screws in bicortical fashion anterior to the prosthesis.  2 screws were placed anterior to prosthesis and then the 3.3 mm drill bit was removed and a 4.0 millimeter screw was placed.  I returned to the distal segment and placed a intermediary 4.0 millimeter screw in the distal femoral diaphysis.  I then proceeded to place a total of five 5.0 millimeter screws.  Locking caps were placed on all of the distal screws.  Locking caps were placed on all of the 4.0 millimeter screws.  Final fluoroscopic imaging was obtained.  The incision was copiously irrigated.  A gram of vancomycin powder was placed into the incision.  A layer closure of 0 Vicryl for the IT band, 2-0 Vicryl and 3-0 Monocryl for the skin with Dermabond.  A sterile dressing was placed.  The patient was then awoken from anesthesia and taken to the PACU in stable condition.  Post Op Plan/Instructions: The patient will be weightbearing as tolerated to the right lower extremity.  She will receive postoperative Ancef.  She will receive Eliquis for her A. fib  once her hemoglobin is stabilized.  We will mobilize her with physical and Occupational Therapy.  I was present and performed the entire surgery.  Ulyses Southward, PA-C did assist me throughout the case. An assistant was necessary given the difficulty in approach, maintenance of reduction and ability to instrument the fracture.   Truitt Merle, MD [facility]

## 2020-11-18 NOTE — Transfer of Care (Signed)
Immediate Anesthesia Transfer of Care Note  Patient: Katrina Velez  Procedure(s) Performed: OPEN REDUCTION INTERNAL FIXATION (ORIF) DISTAL FEMUR FRACTURE (Right: Leg Upper)  Patient Location: PACU  Anesthesia Type:General  Level of Consciousness: drowsy and patient cooperative  Airway & Oxygen Therapy: Patient Spontanous Breathing and Patient connected to nasal cannula oxygen  Post-op Assessment: Report given to RN, Post -op Vital signs reviewed and stable and Patient moving all extremities  Post vital signs: Reviewed and stable  Last Vitals:  Vitals Value Taken Time  BP 101/81 11/18/20 1536  Temp    Pulse    Resp 19 11/18/20 1537  SpO2    Vitals shown include unvalidated device data.  Last Pain:  Vitals:   11/18/20 1210  TempSrc: Oral  PainSc:          Complications: No notable events documented.

## 2020-11-18 NOTE — Progress Notes (Signed)
Patient became increasingly agitated with staff. Staff attempted to reorient patient and explain importance of tele monitor, pulse ox and IV. Patient then began attempting to hit and grab staff and trying to slide down the bed to " get out of bed." Mittens placed on patient. Charge RN made aware. Tech sitting at bedside with patient at this time. Secure chat sent to NP Garner Nash  to update on situation. Order placed for safety sitter.

## 2020-11-18 NOTE — Progress Notes (Signed)
PT Cancellation Note  Patient Details Name: Katrina Velez MRN: [id number] DOB: 06/25/1925   Cancelled Treatment:    Reason Eval/Treat Not Completed: Medical issues which prohibited therapy.  Pt is awaiting surgery and combative with staff.  Hold PT evaluation until pt is more stable.  Surgery may be tomorrow for femoral fracture.   Ivar Drape 11/18/2020, 9:27 AM  Samul Dada, PT MS Acute Rehab Dept. Number: [facility] [id number] and [facility] [phone]

## 2020-11-18 NOTE — Progress Notes (Signed)
Patient became agitated with staff and began hitting staff members and verbally abusing staff at this time. Charge RN made aware.

## 2020-11-19 ENCOUNTER — Encounter (HOSPITAL_COMMUNITY): Payer: Self-pay | Admitting: Student

## 2020-11-19 DIAGNOSIS — I4891 Unspecified atrial fibrillation: Secondary | ICD-10-CM

## 2020-11-19 LAB — COMPREHENSIVE METABOLIC PANEL
ALT: 14 U/L (ref 0–44)
AST: 34 U/L (ref 15–41)
Albumin: 2.9 g/dL — ABNORMAL LOW (ref 3.5–5.0)
Alkaline Phosphatase: 43 U/L (ref 38–126)
Anion gap: 6 (ref 5–15)
BUN: 14 mg/dL (ref 8–23)
CO2: 26 mmol/L (ref 22–32)
Calcium: 9 mg/dL (ref 8.9–10.3)
Chloride: 103 mmol/L (ref 98–111)
Creatinine, Ser: 0.67 mg/dL (ref 0.44–1.00)
GFR, Estimated: >60 mL/min (ref >60)
Glucose, Bld: 151 mg/dL — ABNORMAL HIGH (ref 70–99)
Potassium: 4.2 mmol/L (ref 3.5–5.1)
Sodium: 135 mmol/L (ref 135–145)
Total Bilirubin: 1.2 mg/dL (ref 0.3–1.2)
Total Protein: 5.5 g/dL — ABNORMAL LOW (ref 6.5–8.1)

## 2020-11-19 LAB — CBC WITH DIFFERENTIAL/PLATELET
Abs Immature Granulocytes: 0.1 10*3/uL — ABNORMAL HIGH (ref 0.00–0.07)
Basophils Absolute: 0 10*3/uL (ref 0.0–0.1)
Basophils Relative: 0 %
Eosinophils Absolute: 0 10*3/uL (ref 0.0–0.5)
Eosinophils Relative: 0 %
HCT: 26.4 % — ABNORMAL LOW (ref 36.0–46.0)
Hemoglobin: 8.9 g/dL — ABNORMAL LOW (ref 12.0–15.0)
Immature Granulocytes: 1 %
Lymphocytes Relative: 6 %
Lymphs Abs: 0.9 10*3/uL (ref 0.7–4.0)
MCH: 31.1 pg (ref 26.0–34.0)
MCHC: 33.7 g/dL (ref 30.0–36.0)
MCV: 92.3 fL (ref 80.0–100.0)
Monocytes Absolute: 2.7 10*3/uL — ABNORMAL HIGH (ref 0.1–1.0)
Monocytes Relative: 18 %
Neutro Abs: 11.6 10*3/uL — ABNORMAL HIGH (ref 1.7–7.7)
Neutrophils Relative %: 75 %
Platelets: 294 10*3/uL (ref 150–400)
RBC: 2.86 MIL/uL — ABNORMAL LOW (ref 3.87–5.11)
RDW: 14 % (ref 11.5–15.5)
WBC: 15.3 10*3/uL — ABNORMAL HIGH (ref 4.0–10.5)
nRBC: 0 % (ref 0.0–0.2)

## 2020-11-19 LAB — RETICULOCYTES
Immature Retic Fract: 27.7 % — ABNORMAL HIGH (ref 2.3–15.9)
RBC.: 2.84 MIL/uL — ABNORMAL LOW (ref 3.87–5.11)
Retic Count, Absolute: 84.3 10*3/uL (ref 19.0–186.0)
Retic Ct Pct: 3 % (ref 0.4–3.1)

## 2020-11-19 LAB — FERRITIN: Ferritin: 98 ng/mL (ref 11–307)

## 2020-11-19 LAB — FOLATE: Folate: 9.4 ng/mL (ref >5.9)

## 2020-11-19 LAB — MAGNESIUM: Magnesium: 1.7 mg/dL (ref 1.7–2.4)

## 2020-11-19 LAB — IRON AND TIBC
Iron: 18 ug/dL — ABNORMAL LOW (ref 28–170)
Saturation Ratios: 7 % — ABNORMAL LOW (ref 10.4–31.8)
TIBC: 265 ug/dL (ref 250–450)
UIBC: 247 ug/dL

## 2020-11-19 LAB — VITAMIN B12: Vitamin B-12: 495 pg/mL (ref 180–914)

## 2020-11-19 LAB — PHOSPHORUS: Phosphorus: 2.2 mg/dL — ABNORMAL LOW (ref 2.5–4.6)

## 2020-11-19 MED ORDER — K PHOS MONO-SOD PHOS DI & MONO 155-852-130 MG PO TABS
500.0000 mg | ORAL_TABLET | Freq: Two times a day (BID) | ORAL | Status: AC
  Administered 2020-11-19 (×2): 500 mg via ORAL
  Filled 2020-11-19 (×2): qty 2

## 2020-11-19 MED ORDER — TRAMADOL HCL 50 MG PO TABS: 50.0000 mg | ORAL_TABLET | Freq: Two times a day (BID) | ORAL | 0 refills | Status: DC | PRN

## 2020-11-19 MED ORDER — METOPROLOL TARTRATE 5 MG/5ML IV SOLN
5.0000 mg | Freq: Once | INTRAVENOUS | Status: AC
  Administered 2020-11-19: 5 mg via INTRAVENOUS
  Filled 2020-11-19: qty 5

## 2020-11-19 MED ORDER — FERROUS SULFATE 325 (65 FE) MG PO TABS
325.0000 mg | ORAL_TABLET | Freq: Every day | ORAL | Status: DC
  Administered 2020-11-19 – 2020-11-24 (×6): 325 mg via ORAL
  Filled 2020-11-19 (×6): qty 1

## 2020-11-19 MED ORDER — METOPROLOL TARTRATE 5 MG/5ML IV SOLN: 2.5000 mg | Freq: Once | INTRAVENOUS | Status: DC

## 2020-11-19 MED ORDER — APIXABAN 5 MG PO TABS
5.0000 mg | ORAL_TABLET | Freq: Two times a day (BID) | ORAL | Status: DC
  Administered 2020-11-19 – 2020-11-24 (×11): 5 mg via ORAL
  Filled 2020-11-19 (×11): qty 1

## 2020-11-19 MED ORDER — TRAMADOL HCL 50 MG PO TABS
50.0000 mg | ORAL_TABLET | Freq: Four times a day (QID) | ORAL | Status: DC | PRN
Start: 2020-11-19 — End: 2020-11-24
  Filled 2020-11-19: qty 2

## 2020-11-19 MED ORDER — MAGNESIUM SULFATE 2 GM/50ML IV SOLN
2.0000 g | Freq: Once | INTRAVENOUS | Status: AC
  Administered 2020-11-19: 2 g via INTRAVENOUS
  Filled 2020-11-19: qty 50

## 2020-11-19 NOTE — Evaluation (Signed)
Physical Therapy Evaluation Patient Details Name: Katrina Velez MRN: [id number] DOB: 06-Jan-1926 Today's Date: 11/19/2020   History of Present Illness  Pt is a 87 year old woman admitted after mechanical fall at home on 11/15/20 resulting in R distal femur fx. Underwent ORIF on 11/18/20. Hospital course complicated by combativeness from morphine. PMH: afib on anticoagulation, hypothyroidism, OA, depression, spinal stenosis, CKD, COPD, COPD, B shoulder replacements, B knee replacements.   Clinical Impression  Pt in bed upon arrival of PT, agreeable to evaluation at this time. Prior to admission the pt was living independently, mobilizing with use of a SPC, but with frequent visits and interaction with her daughter. The pt now presents with limitations in functional mobility, strength, ROM, activity tolerance, and balance due to above dx, and will continue to benefit from skilled PT to address these deficits. The pt was able to demo good mobility of LLE, and completed movement of LE to EOB with mod/maxA due to pain in her RLE. The pt required totalA for trunk support initially, but was able to progress to minG within session for seated balance. The pt attempted standing x2 with modA of 2 and use of RW. Due to need for assist of 2 at this time and significant limitations in activity tolerance, recommend SNF-level rehab at d/c to facilitate return to independence.       Follow Up Recommendations SNF;Supervision/Assistance - 24 hour    Equipment Recommendations   (defer to post acute)    Recommendations for Other Services       Precautions / Restrictions Precautions Precautions: Fall Precaution Comments: HOH Restrictions Weight Bearing Restrictions: Yes RLE Weight Bearing: Weight bearing as tolerated      Mobility  Bed Mobility Overal bed mobility: Needs Assistance Bed Mobility: Rolling;Supine to Sit;Sit to Supine Rolling: Max assist   Supine to sit: +2 for physical assistance;Total  assist Sit to supine: +2 for physical assistance;Total assist   General bed mobility comments: used bed pad with LEs supported to pivot pt to and from EOB, rolled for pericare and to change wet pad with pillow placed between knees    Transfers Overall transfer level: Needs assistance Equipment used: Rolling walker (2 wheeled) Transfers: Sit to/from Stand Sit to Stand: Mod assist;+2 physical assistance;From elevated surface         General transfer comment: pt allowed to pull up on walker, assisted with bed pad and gait belt to rise and steady, unable to take side steps  Ambulation/Gait             General Gait Details: pt unable to generate any steps at this time.     Balance Overall balance assessment: Needs assistance Sitting-balance support: Bilateral upper extremity supported Sitting balance-Leahy Scale: Poor   Postural control: Posterior lean;Right lateral lean Standing balance support: Bilateral upper extremity supported Standing balance-Leahy Scale: Poor Standing balance comment: +2 moderate assistance with R side lean                             Pertinent Vitals/Pain Pain Assessment: Faces Faces Pain Scale: Hurts even more Pain Location: R LE Pain Descriptors / Indicators: Grimacing;Guarding;Discomfort Pain Intervention(s): Limited activity within patient's tolerance;Monitored during session;Repositioned    Home Living Family/patient expects to be discharged to:: Skilled nursing facility Living Arrangements: Alone;Other (Comment) (daughter is with pt 6-7 hours a day and checks on her constantly. pt has option of living with daughter if needed)  Prior Function Level of Independence: Needs assistance   Gait / Transfers Assistance Needed: ambulated with a cane  ADL's / Homemaking Assistance Needed: independent in self care, assisted for meals, housekeeping and transportation        Hand Dominance   Dominant Hand:  Right    Extremity/Trunk Assessment   Upper Extremity Assessment Upper Extremity Assessment: Defer to OT evaluation    Lower Extremity Assessment Lower Extremity Assessment: Generalized weakness;RLE deficits/detail RLE Deficits / Details: pt reports decreased sensation in R thigh, no change below knee. limited by pain, but able to move ankle/toes well. partial ROM at knee but limited due to pain with knee flexion RLE: Unable to fully assess due to pain RLE Sensation: decreased light touch    Cervical / Trunk Assessment Cervical / Trunk Assessment: Other exceptions;Kyphotic Cervical / Trunk Exceptions: weakness  Communication   Communication: HOH  Cognition Arousal/Alertness: Awake/alert Behavior During Therapy: Flat affect Overall Cognitive Status: Difficult to assess                                 General Comments: following commands with repetition due to hearing      General Comments General comments (skin integrity, edema, etc.): VSS on RA, pt family present and supportive. pt with no appetite    Exercises General Exercises - Lower Extremity Ankle Circles/Pumps: AROM;Both;10 reps;Supine Quad Sets: AROM;Both;10 reps;Supine Heel Slides: AROM;Both;10 reps;Supine   Assessment/Plan    PT Assessment Patient needs continued PT services  PT Problem List Decreased range of motion;Decreased strength;Decreased activity tolerance;Decreased balance       PT Treatment Interventions DME instruction;Gait training;Stair training;Therapeutic exercise;Therapeutic activities;Functional mobility training;Balance training;Patient/family education    PT Goals (Current goals can be found in the Care Plan section)  Acute Rehab PT Goals Patient Stated Goal: agreeable to work with therapies, daughter would like pt to return home when able PT Goal Formulation: With patient Time For Goal Achievement: 12/03/20 Potential to Achieve Goals: Good    Frequency Min 3X/week    Barriers to discharge Inaccessible home environment;Decreased caregiver support pt from home alone    Co-evaluation PT/OT/SLP Co-Evaluation/Treatment: Yes Reason for Co-Treatment: For patient/therapist safety;To address functional/ADL transfers PT goals addressed during session: Mobility/safety with mobility;Balance;Strengthening/ROM;Proper use of DME         AM-PAC PT "6 Clicks" Mobility  Outcome Measure Help needed turning from your back to your side while in a flat bed without using bedrails?: A Lot Help needed moving from lying on your back to sitting on the side of a flat bed without using bedrails?: Total Help needed moving to and from a bed to a chair (including a wheelchair)?: Total Help needed standing up from a chair using your arms (e.g., wheelchair or bedside chair)?: Total Help needed to walk in hospital room?: Total Help needed climbing 3-5 steps with a railing? : Total 6 Click Score: 7    End of Session Equipment Utilized During Treatment: Gait belt Activity Tolerance: Patient tolerated treatment well;Patient limited by pain Patient left: in bed;with call bell/phone within reach;with bed alarm set;with family/visitor present Nurse Communication: Mobility status PT Visit Diagnosis: Other abnormalities of gait and mobility (R26.89);Muscle weakness (generalized) (M62.81);Pain Pain - Right/Left: Right Pain - part of body: Hip;Knee;Leg    Time: 1202-1238 PT Time Calculation (min) (ACUTE ONLY): 36 min   Charges:   PT Evaluation $PT Eval Moderate Complexity: 1 Mod  Rolm Baptise, PT, DPT   Acute Rehabilitation Department Pager #: [phone]  Gaetana Michaelis 11/19/2020, 3:12 PM

## 2020-11-19 NOTE — Evaluation (Signed)
Occupational Therapy Evaluation Patient Details Name: Katrina Velez MRN: [id number] DOB: [birth year] Today's Date: 11/19/2020    History of Present Illness Pt is a 87 year old woman admitted after mechanical fall at home on 11/15/20 resulting in R distal femur fx. Underwent ORIF on 11/18/20. Hospital course complicated by combativeness from morphine. PMH: afib on anticoagulation, hypothyroidism, OA, depression, spinal stenosis, CKD, COPD, COPD, B shoulder replacements, B knee replacements.   Clinical Impression   Pt lived alone with her daughter visiting her throughout the day and contacting her by phone every night. Her daughter lives across the street. Pt ambulated with a cane as "walkers are for old people." She was able to perform ADL independently, but was assisted by her children for IADL. Pt presents with pain, generalized weakness and impaired sitting and standing balance. She requires +2 assistance for all mobility, but was able to stand from an elevated bed this visit. Pt requires set up to total assist for ADL. Difficult to assess cognition due to [facility]. She will need post acute rehab in SNF upon discharge. Will follow acutely.     Follow Up Recommendations  SNF;Supervision/Assistance - 24 hour    Equipment Recommendations  Other (comment) (defer to next venue)    Recommendations for Other Services       Precautions / Restrictions Precautions Precautions: Fall Restrictions Weight Bearing Restrictions: No RLE Weight Bearing: Weight bearing as tolerated      Mobility Bed Mobility Overal bed mobility: Needs Assistance Bed Mobility: Rolling;Supine to Sit;Sit to Supine Rolling: Max assist   Supine to sit: +2 for physical assistance;Total assist Sit to supine: +2 for physical assistance;Total assist   General bed mobility comments: used bed pad with LEs supported to pivot pt to and from EOB, rolled for pericare and to change wet pad with pillow placed between knees     Transfers Overall transfer level: Needs assistance Equipment used: Rolling walker (2 wheeled) Transfers: Sit to/from Stand Sit to Stand: From elevated surface;+2 physical assistance;Mod assist         General transfer comment: pt allowed to pull up on walker, assisted with bed pad and gait belt to rise and steady, unable to take side steps    Balance Overall balance assessment: Needs assistance Sitting-balance support: Bilateral upper extremity supported Sitting balance-Leahy Scale: Poor   Postural control: Posterior lean Standing balance support: Bilateral upper extremity supported Standing balance-Leahy Scale: Poor Standing balance comment: +2 moderate assistance with R side lean                           ADL either performed or assessed with clinical judgement   ADL Overall ADL's : Needs assistance/impaired Eating/Feeding: Set up;Bed level   Grooming: Oral care;Bed level;Minimal assistance   Upper Body Bathing: Maximal assistance;Sitting   Lower Body Bathing: Total assistance;+2 for physical assistance;Sit to/from stand   Upper Body Dressing : Maximal assistance;Sitting   Lower Body Dressing: +2 for physical assistance;Total assistance;Sit to/from stand       Toileting- Architect and Hygiene: Total assistance;Bed level               Vision Baseline Vision/History: Wears glasses Wears Glasses: Reading only Patient Visual Report: No change from baseline       Perception     Praxis      Pertinent Vitals/Pain Pain Assessment: Faces Faces Pain Scale: Hurts even more Pain Location: R LE Pain Descriptors / Indicators: Grimacing;Guarding;Discomfort Pain Intervention(s): Monitored  during session;Repositioned;Limited activity within patient's tolerance     Hand Dominance Right   Extremity/Trunk Assessment Upper Extremity Assessment Upper Extremity Assessment: Generalized weakness   Lower Extremity Assessment Lower Extremity  Assessment: Defer to PT evaluation   Cervical / Trunk Assessment Cervical / Trunk Assessment: Other exceptions;Kyphotic Cervical / Trunk Exceptions: weakness   Communication Communication Communication: HOH   Cognition Arousal/Alertness: Awake/alert Behavior During Therapy: Flat affect Overall Cognitive Status: Difficult to assess                                 General Comments: following commands with repetition due to hearing   General Comments       Exercises     Shoulder Instructions      Home Living Family/patient expects to be discharged to:: Skilled nursing facility Living Arrangements: Alone;Other (Comment) (daughter is with pt 6-7 hours a day and checks on her constantly)                                      Prior Functioning/Environment Level of Independence: Needs assistance  Gait / Transfers Assistance Needed: ambulated with a cane ADL's / Homemaking Assistance Needed: independent in self care, assisted for meals, housekeeping and transportation            OT Problem List: Decreased strength;Decreased activity tolerance;Impaired balance (sitting and/or standing);Decreased knowledge of use of DME or AE;Cardiopulmonary status limiting activity;Pain      OT Treatment/Interventions: Self-care/ADL training;DME and/or AE instruction;Therapeutic activities;Patient/family education;Balance training    OT Goals(Current goals can be found in the care plan section) Acute Rehab OT Goals Patient Stated Goal: agreeable to work with therapies, daughter would like pt to return home when able OT Goal Formulation: With patient/family Time For Goal Achievement: 12/03/20 Potential to Achieve Goals: Good ADL Goals Pt Will Perform Grooming: with min guard assist;sitting Pt Will Perform Upper Body Dressing: with min assist;sitting Pt Will Transfer to Toilet: with +2 assist;with min assist;stand pivot transfer;bedside commode Additional ADL Goal  #1: Pt will perform bed mobility with moderate assistance in preparation for ADL. Additional ADL Goal #2: Pt will demonstrate fair sitting balance at EOB as a precursor to ADL.  OT Frequency: Min 2X/week   Barriers to D/C:            Co-evaluation PT/OT/SLP Co-Evaluation/Treatment: Yes Reason for Co-Treatment: For patient/therapist safety   OT goals addressed during session: Strengthening/ROM      AM-PAC OT "6 Clicks" Daily Activity     Outcome Measure Help from another person eating meals?: A Little Help from another person taking care of personal grooming?: A Little Help from another person toileting, which includes using toliet, bedpan, or urinal?: Total Help from another person bathing (including washing, rinsing, drying)?: A Lot Help from another person to put on and taking off regular upper body clothing?: A Lot Help from another person to put on and taking off regular lower body clothing?: Total 6 Click Score: 12   End of Session Equipment Utilized During Treatment: Gait belt;Rolling walker  Activity Tolerance: Patient tolerated treatment well Patient left: in bed;with call bell/phone within reach;with bed alarm set;with family/visitor present  OT Visit Diagnosis: Unsteadiness on feet (R26.81);Other abnormalities of gait and mobility (R26.89);Pain;Muscle weakness (generalized) (M62.81)                Time: 7672-0947  OT Time Calculation (min): 33 min Charges:  OT General Charges $OT Visit: 1 Visit OT Evaluation $OT Eval Moderate Complexity: 1 Mod  Martie Round, OTR/L Acute Rehabilitation Services Pager: [phone] Office: [phone]  Evern Bio 11/19/2020, 1:23 PM

## 2020-11-19 NOTE — NC FL2 (Signed)
[place] MEDICAID FL2 LEVEL OF CARE SCREENING TOOL     IDENTIFICATION  Patient Name: Katrina Velez Birthdate: 1926-04-11 Sex: female Admission Date (Current Location): 11/15/2020  [facility] and [place] Number:  Producer, television/film/video and Address:  [facility] [place], [address], [place], [place] [address]      Provider Number: [id number]  Attending Physician Name and Address:  Merlene Laughter, DO  Relative Name and Phone Number:  Lindwood Qua (Daughter)   [phone]    Current Level of Care: Hospital Recommended Level of Care: Skilled Nursing Facility Prior Approval Number:    Date Approved/Denied:   PASRR Number: [id number]  Discharge Plan: SNF    Current Diagnoses: Patient Active Problem List   Diagnosis Date Noted   Acute blood loss anemia 11/18/2020   Femur fracture (HCC) 11/16/2020   Spinal stenosis of lumbar region with neurogenic claudication 05/11/2017   Seasonal allergic rhinitis due to pollen 10/04/2016   High risk medication use 10/04/2016   Osteoarthritis 03/01/2016   Iatrogenic hyperthyroidism 03/01/2016   Atrial fibrillation (HCC) 06/05/2015   Nonspecific abnormal electrocardiogram (ECG) (EKG) 05/27/2014   Elevated glucose 03/26/2012   Hypothyroidism post removal of goiter 01/31/2012   Nicotine addiction 01/31/2012   Inflammatory arthritis 01/31/2012    Orientation RESPIRATION BLADDER Height & Weight     Self  Normal Incontinent Weight: 145 lb 1 oz (65.8 kg) Height:  5\' 2"  (157.5 cm)  BEHAVIORAL SYMPTOMS/MOOD NEUROLOGICAL BOWEL NUTRITION STATUS      Continent Diet (see d/c summary)  AMBULATORY STATUS COMMUNICATION OF NEEDS Skin   Total Care Verbally Surgical wounds                       Personal Care Assistance Level of Assistance  Bathing, Feeding, Dressing Bathing Assistance: Maximum assistance Feeding assistance: Limited assistance Dressing Assistance: Maximum assistance     Functional Limitations Info   Sight, Hearing, Speech Sight Info: Impaired Hearing Info: Impaired Speech Info: Impaired    SPECIAL CARE FACTORS FREQUENCY  PT (By licensed PT), OT (By licensed OT)     PT Frequency: 5x/ week OT Frequency: 5x/ week            Contractures Contractures Info: Not present    Additional Factors Info  Code Status, Allergies Code Status Info: DNR Allergies Info: Penicillins, Banana           Current Medications (11/19/2020):  This is the current hospital active medication list Current Facility-Administered Medications  Medication Dose Route Frequency Provider Last Rate Last Admin   0.9 %  sodium chloride infusion (Manually program via Guardrails IV Fluids)   Intravenous Once 01/20/2021 A, PA-C       0.9 %  sodium chloride infusion (Manually program via Guardrails IV Fluids)   Intravenous Once Ulyses Southward, PA-C       acetaminophen (TYLENOL) tablet 650 mg  650 mg Oral Q4H PRN Despina Hidden, PA-C       apixaban Despina Hidden) tablet 5 mg  5 mg Oral BID Dang, Thuy D, RPH   5 mg at 11/19/20 01/20/21   diltiazem (CARDIZEM CD) 24 hr capsule 120 mg  120 mg Oral BID 8469, PA-C   120 mg at 11/19/20 01/20/21   docusate sodium (COLACE) capsule 100 mg  100 mg Oral BID 6295, PA-C   100 mg at 11/19/20 01/20/21   ferrous sulfate tablet 325 mg  325 mg Oral Q breakfast 2841,  Shawn Route, PA-C   325 mg at 11/19/20 0902   lactose free nutrition (BOOST PLUS) liquid 237 mL  237 mL Oral TID WC Despina Hidden, PA-C   237 mL at 11/18/20 1756   levothyroxine (SYNTHROID) tablet 125 mcg  125 mcg Oral Q0600 Despina Hidden, PA-C   125 mcg at 11/19/20 0518   magnesium sulfate IVPB 2 g 50 mL  2 g Intravenous Once Sheikh, Kateri Mc Latif, DO       methocarbamol (ROBAXIN) tablet 500 mg  500 mg Oral Q6H PRN Despina Hidden, PA-C   500 mg at 11/16/20 4656   Or   methocarbamol (ROBAXIN) 500 mg in dextrose 5 % 50 mL IVPB  500 mg Intravenous Q6H PRN Despina Hidden, PA-C       metoCLOPramide (REGLAN) tablet  5-10 mg  5-10 mg Oral Q8H PRN Despina Hidden, PA-C       Or   metoCLOPramide (REGLAN) injection 5-10 mg  5-10 mg Intravenous Q8H PRN Despina Hidden, PA-C       metoprolol succinate (TOPROL-XL) 24 hr tablet 25 mg  25 mg Oral BID Despina Hidden, PA-C   25 mg at 11/19/20 0902   mirtazapine (REMERON) tablet 7.5 mg  7.5 mg Oral QHS Despina Hidden, PA-C   7.5 mg at 11/18/20 2130   multivitamin with minerals tablet 1 tablet  1 tablet Oral Daily Despina Hidden, PA-C   1 tablet at 11/19/20 0902   ondansetron (ZOFRAN) tablet 4 mg  4 mg Oral Q6H PRN Despina Hidden, PA-C       Or   ondansetron [facility]) injection 4 mg  4 mg Intravenous Q6H PRN Despina Hidden, PA-C       phosphorus (K PHOS NEUTRAL) tablet 500 mg  500 mg Oral BID Sheikh, Omair Latif, DO       polyethylene glycol (MIRALAX / GLYCOLAX) packet 17 g  17 g Oral Daily PRN Despina Hidden, PA-C       senna (SENOKOT) tablet 8.6 mg  1 tablet Oral BID Ulyses Southward A, PA-C   8.6 mg at 11/19/20 8127   traMADol (ULTRAM) tablet 50-100 mg  50-100 mg Oral Q6H PRN Despina Hidden, PA-C         Discharge Medications: Please see discharge summary for a list of discharge medications.  Relevant Imaging Results:  Relevant Lab Results:   Additional Information SSN:  [id number]     Pfizer COVID-19 Vaccine 09/03/2020 , 02/15/2020 , 06/22/2019 , 06/03/2019  Ralene Bathe, LCSWA

## 2020-11-19 NOTE — TOC Initial Note (Signed)
Transition of Care [facility]) - Initial/Assessment Note    Patient Details  Name: Katrina Velez MRN: [id number] Date of Birth: 1926/04/09  [facility] of [facility]) CM/SW Contact:    Ralene Bathe, LCSWA Phone Number: 11/19/2020, 2:49 PM  Clinical Narrative:                 CSW received consult for possible SNF placement at time of discharge. CSW spoke with patient's daughter due to the patient being disoriented. The patient's daughter, Katrina Velez, expressed understanding of PT recommendation and is agreeable to SNF placement at time of discharge. Patient's daughter reports preference for a SNF near [facility]. CSW discussed insurance authorization process.  Patient has received the COVID vaccines. No further questions reported at this time.      Expected Discharge Plan: Skilled Nursing Facility Barriers to Discharge: Continued Medical Work up   Patient Goals and CMS Choice   CMS Medicare.gov Compare Post Acute Care list provided to:: Patient Represenative (must comment) Choice offered to / list presented to : Adult Children  Expected Discharge Plan and Services Expected Discharge Plan: Skilled Nursing Facility                                              Prior Living Arrangements/Services   Lives with:: Self Patient language and need for interpreter reviewed:: Yes        Need for Family Participation in Patient Care: Yes (Comment) Care giver support system in place?: Yes (comment)   Criminal Activity/Legal Involvement Pertinent to Current Situation/Hospitalization: No - Comment as needed  Activities of Daily Living Home Assistive Devices/Equipment: Shower chair with back, Cane (specify quad or straight), Walker (specify type) ADL Screening (condition at time of admission) Patient's cognitive ability adequate to safely complete daily activities?: Yes Is the patient deaf or have difficulty hearing?: Yes Does the patient have difficulty seeing, even when wearing  glasses/contacts?: Yes Does the patient have difficulty concentrating, remembering, or making decisions?: Yes Patient able to express need for assistance with ADLs?: Yes Does the patient have difficulty dressing or bathing?: No Independently performs ADLs?: Yes (appropriate for developmental age) Does the patient have difficulty walking or climbing stairs?: Yes Weakness of Legs: Both Weakness of Arms/Hands: Both  Permission Sought/Granted                  Emotional Assessment         Alcohol / Substance Use: Not Applicable Psych Involvement: No (comment)  Admission diagnosis:  Hip fracture (HCC) [S72.009A] Fall [W19.XXXA] Pre-op evaluation [Z01.818] Fall, initial encounter [id number]] Closed fracture of shaft of right femur, unspecified fracture morphology, initial encounter (HCC) [S72.301A] Patient Active Problem List   Diagnosis Date Noted   Acute blood loss anemia 11/18/2020   Femur fracture (HCC) 11/16/2020   Spinal stenosis of lumbar region with neurogenic claudication 05/11/2017   Seasonal allergic rhinitis due to pollen 10/04/2016   High risk medication use 10/04/2016   Osteoarthritis 03/01/2016   Iatrogenic hyperthyroidism 03/01/2016   Atrial fibrillation (HCC) 06/05/2015   Nonspecific abnormal electrocardiogram (ECG) (EKG) 05/27/2014   Elevated glucose 03/26/2012   Hypothyroidism post removal of goiter 01/31/2012   Nicotine addiction 01/31/2012   Inflammatory arthritis 01/31/2012   PCP:  System, Provider Not In Pharmacy:   [id number] - [place], [place] - [address] [place] [address] Phone: [phone]  Fax: [phone]     Social Determinants of Health (SDOH) Interventions    Readmission Risk Interventions No flowsheet data found.

## 2020-11-19 NOTE — Discharge Instructions (Signed)
[facility]uma [facility] Discharge Instructions   General Discharge Instructions  WEIGHT BEARING STATUS:WEIGHTBEARING AS TOLERATED  RANGE OF MOTION/ACTIVITY: OK FOR KNEE AND HIP MOTION AS TOLERATED  Wound Care: Incisions can be left open to air if there is no drainage. If incision continues to have drainage, follow wound care instructions below. Okay to shower if no drainage from incisions.  DVT/PE prophylaxis:  ELIQUIS  Diet: as you were eating previously.  Can use over the counter stool softeners and bowel preparations, such as Miralax, to help with bowel movements.  Narcotics can be constipating.  Be sure to drink plenty of fluids  PAIN MEDICATION USE AND EXPECTATIONS  You have likely been given narcotic medications to help control your pain.  After a traumatic event that results in an fracture (broken bone) with or without surgery, it is ok to use narcotic pain medications to help control one's pain.  We understand that everyone responds to pain differently and each individual patient will be evaluated on a regular basis for the continued need for narcotic medications. Ideally, narcotic medication use should last no more than 6-8 weeks (coinciding with fracture healing).   As a patient it is your responsibility as well to monitor narcotic medication use and report the amount and frequency you use these medications when you come to your office visit.   We would also advise that if you are using narcotic medications, you should take a dose prior to therapy to maximize you participation.  IF YOU ARE ON NARCOTIC MEDICATIONS IT IS NOT PERMISSIBLE TO OPERATE A MOTOR VEHICLE (MOTORCYCLE/CAR/TRUCK/MOPED) OR HEAVY MACHINERY DO NOT MIX NARCOTICS WITH OTHER CNS (CENTRAL NERVOUS SYSTEM) DEPRESSANTS SUCH AS ALCOHOL   STOP SMOKING OR USING NICOTINE PRODUCTS!!!!  As discussed nicotine severely impairs your body's ability to heal surgical and traumatic wounds but also impairs bone healing.  Wounds  and bone heal by forming microscopic blood vessels (angiogenesis) and nicotine is a vasoconstrictor (essentially, shrinks blood vessels).  Therefore, if vasoconstriction occurs to these microscopic blood vessels they essentially disappear and are unable to deliver necessary nutrients to the healing tissue.  This is one modifiable factor that you can do to dramatically increase your chances of healing your injury.    (This means no smoking, no nicotine gum, patches, etc)  DO NOT USE NONSTEROIDAL ANTI-INFLAMMATORY DRUGS (NSAID'S)  Using products such as Advil (ibuprofen), Aleve (naproxen), Motrin (ibuprofen) for additional pain control during fracture healing can delay and/or prevent the healing response.  If you would like to take over the counter (OTC) medication, Tylenol (acetaminophen) is ok.  However, some narcotic medications that are given for pain control contain acetaminophen as well. Therefore, you should not exceed more than 4000 mg of tylenol in a day if you do not have liver disease.  Also note that there are may OTC medicines, such as cold medicines and allergy medicines that my contain tylenol as well.  If you have any questions about medications and/or interactions please ask your doctor/PA or your pharmacist.      ICE AND ELEVATE INJURED/OPERATIVE EXTREMITY  Using ice and elevating the injured extremity above your heart can help with swelling and pain control.  Icing in a pulsatile fashion, such as 20 minutes on and 20 minutes off, can be followed.    Do not place ice directly on skin. Make sure there is a barrier between to skin and the ice pack.    Using frozen items such as frozen peas works well as the  conform nicely to the are that needs to be iced.  USE AN ACE WRAP OR TED HOSE FOR SWELLING CONTROL  In addition to icing and elevation, Ace wraps or TED hose are used to help limit and resolve swelling.  It is recommended to use Ace wraps or TED hose until you are informed to stop.     When using Ace Wraps start the wrapping distally (farthest away from the body) and wrap proximally (closer to the body)   Example: If you had surgery on your leg or thing and you do not have a splint on, start the ace wrap at the toes and work your way up to the thigh        If you had surgery on your upper extremity and do not have a splint on, start the ace wrap at your fingers and work your way up to the upper arm   CALL THE OFFICE WITH ANY QUESTIONS OR CONCERNS: [phone]   VISIT OUR WEBSITE FOR ADDITIONAL INFORMATION: [email or web address]    Discharge Wound Care Instructions  Do NOT apply any ointments, solutions or lotions to pin sites or surgical wounds.  These prevent needed drainage and even though solutions like hydrogen peroxide kill bacteria, they also damage cells lining the pin sites that help fight infection.  Applying lotions or ointments can keep the wounds moist and can cause them to breakdown and open up as well. This can increase the risk for infection. When in doubt call the office.  If any drainage is noted, use foam dressing  Once the incision is completely dry and without drainage, it may be left open to air out.  Showering may begin 36-48 hours later.  Cleaning gently with soap and water.

## 2020-11-19 NOTE — Plan of Care (Signed)

## 2020-11-19 NOTE — Progress Notes (Signed)
Orthopaedic Trauma Progress Note  SUBJECTIVE: Doing okay today. Pain controlled currently. Hasn't been out of bed yet since surgery. States she is not sure she wants to work with therapies today, thinks she may want to "just lay in bed and rest." No other complaints.   OBJECTIVE:  Vitals:   11/19/20 0100 11/19/20 0500  BP: 98/81 112/61  Pulse: (!) 109 92  Resp: 18 16  Temp: 98.8 F (37.1 C) 98.7 F (37.1 C)  SpO2: 95% 93%    General: Laying in bed, NAD Respiratory: No increased work of breathing.  RLE: Dressing CDI. Tender through thigh as expected. Ankle DF/PF intact. Wiggles toes. Foot warm and well perfused  IMAGING: Stable post op imaging.   LABS:  Results for orders placed or performed during the hospital encounter of 11/15/20 (from the past 24 hour(s))  Comprehensive metabolic panel     Status: Abnormal   Collection Time: 11/18/20  9:50 AM  Result Value Ref Range   Sodium 134 (L) 135 - 145 mmol/L   Potassium 4.2 3.5 - 5.1 mmol/L   Chloride 101 98 - 111 mmol/L   CO2 27 22 - 32 mmol/L   Glucose, Bld 148 (H) 70 - 99 mg/dL   BUN 14 8 - 23 mg/dL   Creatinine, Ser 1.32 0.44 - 1.00 mg/dL   Calcium 9.0 8.9 - 44.0 mg/dL   Total Protein 5.6 (L) 6.5 - 8.1 g/dL   Albumin 3.0 (L) 3.5 - 5.0 g/dL   AST 27 15 - 41 U/L   ALT 12 0 - 44 U/L   Alkaline Phosphatase 43 38 - 126 U/L   Total Bilirubin 0.7 0.3 - 1.2 mg/dL   GFR, Estimated >10 >27 mL/min   Anion gap 6 5 - 15  CBC with Differential/Platelet     Status: Abnormal   Collection Time: 11/18/20  9:50 AM  Result Value Ref Range   WBC 11.9 (H) 4.0 - 10.5 K/uL   RBC 2.65 (L) 3.87 - 5.11 MIL/uL   Hemoglobin 8.0 (L) 12.0 - 15.0 g/dL   HCT 25.3 (L) 66.4 - 40.3 %   MCV 93.2 80.0 - 100.0 fL   MCH 30.2 26.0 - 34.0 pg   MCHC 32.4 30.0 - 36.0 g/dL   RDW 47.4 25.9 - 56.3 %   Platelets 281 150 - 400 K/uL   nRBC 0.0 0.0 - 0.2 %   Neutrophils Relative % 78 %   Neutro Abs 9.2 (H) 1.7 - 7.7 K/uL   Lymphocytes Relative 6 %   Lymphs Abs  0.7 0.7 - 4.0 K/uL   Monocytes Relative 15 %   Monocytes Absolute 1.8 (H) 0.1 - 1.0 K/uL   Eosinophils Relative 0 %   Eosinophils Absolute 0.0 0.0 - 0.5 K/uL   Basophils Relative 0 %   Basophils Absolute 0.1 0.0 - 0.1 K/uL   Immature Granulocytes 1 %   Abs Immature Granulocytes 0.07 0.00 - 0.07 K/uL  Magnesium     Status: None   Collection Time: 11/18/20  9:50 AM  Result Value Ref Range   Magnesium 1.8 1.7 - 2.4 mg/dL  Phosphorus     Status: Abnormal   Collection Time: 11/18/20  9:50 AM  Result Value Ref Range   Phosphorus 2.4 (L) 2.5 - 4.6 mg/dL  Prepare RBC (crossmatch)     Status: None   Collection Time: 11/18/20 12:11 PM  Result Value Ref Range   Order Confirmation      ORDER PROCESSED BY BLOOD BANK  Performed at [facility], [address] [place], [place] [address]   Type and screen [facility]     Status: None (Preliminary result)   Collection Time: 11/18/20 12:20 PM  Result Value Ref Range   ABO/RH(D) O POS    Antibody Screen NEG    Sample Expiration 11/21/2020,2359    Unit Number [id number]    Blood Component Type RED CELLS,LR    Unit division 00    Status of Unit ISSUED    Transfusion Status OK TO TRANSFUSE    Crossmatch Result Compatible    Unit Number [id number]    Blood Component Type RED CELLS,LR    Unit division 00    Status of Unit ALLOCATED    Transfusion Status OK TO TRANSFUSE    Crossmatch Result      Compatible Performed at [facility], [address] [place], [place] [address]    Unit Number [id number]    Blood Component Type RED CELLS,LR    Unit division 00    Status of Unit ALLOCATED    Transfusion Status OK TO TRANSFUSE    Crossmatch Result Compatible    Unit Number [id number]    Blood Component Type RED CELLS,LR    Unit division 00    Status of Unit ALLOCATED    Transfusion Status OK TO TRANSFUSE    Crossmatch Result Compatible   Prepare RBC (crossmatch)     Status: None   Collection Time:  11/18/20  1:38 PM  Result Value Ref Range   Order Confirmation      ORDER PROCESSED BY BLOOD BANK Performed at [facility], [address], [place], [place] [address]   VITAMIN D 25 Hydroxy (Vit-D Deficiency, Fractures)     Status: None   Collection Time: 11/18/20  6:56 PM  Result Value Ref Range   Vit D, 25-Hydroxy 32.97 30 - 100 ng/mL  CBC with Differential/Platelet     Status: Abnormal   Collection Time: 11/19/20  4:58 AM  Result Value Ref Range   WBC 15.3 (H) 4.0 - 10.5 K/uL   RBC 2.86 (L) 3.87 - 5.11 MIL/uL   Hemoglobin 8.9 (L) 12.0 - 15.0 g/dL   HCT 51.7 (L) 61.6 - 07.3 %   MCV 92.3 80.0 - 100.0 fL   MCH 31.1 26.0 - 34.0 pg   MCHC 33.7 30.0 - 36.0 g/dL   RDW 71.0 62.6 - 94.8 %   Platelets 294 150 - 400 K/uL   nRBC 0.0 0.0 - 0.2 %   Neutrophils Relative % 75 %   Neutro Abs 11.6 (H) 1.7 - 7.7 K/uL   Lymphocytes Relative 6 %   Lymphs Abs 0.9 0.7 - 4.0 K/uL   Monocytes Relative 18 %   Monocytes Absolute 2.7 (H) 0.1 - 1.0 K/uL   Eosinophils Relative 0 %   Eosinophils Absolute 0.0 0.0 - 0.5 K/uL   Basophils Relative 0 %   Basophils Absolute 0.0 0.0 - 0.1 K/uL   Immature Granulocytes 1 %   Abs Immature Granulocytes 0.10 (H) 0.00 - 0.07 K/uL  Magnesium     Status: None   Collection Time: 11/19/20  4:58 AM  Result Value Ref Range   Magnesium 1.7 1.7 - 2.4 mg/dL  Phosphorus     Status: Abnormal   Collection Time: 11/19/20  4:58 AM  Result Value Ref Range   Phosphorus 2.2 (L) 2.5 - 4.6 mg/dL  Comprehensive metabolic panel  Status: Abnormal   Collection Time: 11/19/20  4:58 AM  Result Value Ref Range   Sodium 135 135 - 145 mmol/L   Potassium 4.2 3.5 - 5.1 mmol/L   Chloride 103 98 - 111 mmol/L   CO2 26 22 - 32 mmol/L   Glucose, Bld 151 (H) 70 - 99 mg/dL   BUN 14 8 - 23 mg/dL   Creatinine, Ser 8.75 0.44 - 1.00 mg/dL   Calcium 9.0 8.9 - 64.3 mg/dL   Total Protein 5.5 (L) 6.5 - 8.1 g/dL   Albumin 2.9 (L) 3.5 - 5.0 g/dL   AST 34 15 - 41 U/L   ALT 14 0 - 44 U/L    Alkaline Phosphatase 43 38 - 126 U/L   Total Bilirubin 1.2 0.3 - 1.2 mg/dL   GFR, Estimated >32 >95 mL/min   Anion gap 6 5 - 15  Reticulocytes     Status: Abnormal   Collection Time: 11/19/20  4:58 AM  Result Value Ref Range   Retic Ct Pct 3.0 0.4 - 3.1 %   RBC. 2.84 (L) 3.87 - 5.11 MIL/uL   Retic Count, Absolute 84.3 19.0 - 186.0 K/uL   Immature Retic Fract 27.7 (H) 2.3 - 15.9 %    ASSESSMENT: Katrina Velez is a 87 y.o. female, 1 Day Post-Op s/p OPEN REDUCTION INTERNAL FIXATION RIGHT DISTAL FEMUR FRACTURE  CV/Blood loss: Hgb 8.9 this morning, stable. Hemodynamically stable  PLAN: Weightbearing: WBAT RLE ROM: Ok for knee and hip motion as tolerated Incisional and dressing care: Reinforce dressings as needed  Showering: Ok to begin showering with assistance 11/21/20 if no incisional drainage Orthopedic device(s): None  Pain management:  1. Tylenol 650 mg q 4 hours PRN 2. Robaxin 500 mg q 6 hours PRN 3. Tramadol 50 mg q 6 hours PRN 4. Dilaudid 1 mg q 3 hours PRN VTE prophylaxis:  Eliquis , SCDs ID: Vancomycin post op Foley/Lines:  No foley, KVO IVFs Impediments to Fracture Healing: Vit D level 32, no supplementation needed Dispo: PT/OT eval today, dispo pending. Okay for discharge from ortho standpoint once cleared by medicine team and therapies Follow - up plan: 2 weeks after d/c for repeat x-rays  Contact information:  Truitt Merle MD, Ulyses Southward PA-C. After hours and holidays please check [email or web address] for group call information for [facility]   Atul Delucia A. Michaelyn Barter, PA-C [phone] (office) [email or web address]

## 2020-11-19 NOTE — Progress Notes (Signed)
PROGRESS NOTE    Katrina Velez  ZOX:[id number] DOB: [birth year] DOA: 11/15/2020 PCP: System, Provider Not In   Brief Narrative:  The patient is a 87 year old elderly Caucasian female with a past medical history significant for but not limited to permanent atrial fibrillation on anticoagulation with apixaban, hypothyroidism, osteoarthritis, depression, spinal stenosis of his lumbar spine with neurogenic claudication as well as other comorbidities presented to the ED via EMS complaining of right thigh pain after mechanical fall.  She had no significant abnormalities noted on her laboratory work.  She screened negative for COVID.  X-ray did of the hip showed mild right femoral shaft fracture with overlapping angulated and displaced fragments.  Chest x-ray showed no active disease and the EDP spoke to the orthopedic surgeon who consulted and are planning for surgical intervention on Thursday.  Patient was walking from her bedroom to the kitchen to take her evening medications and slept and fell down to the floor.  She denied hitting her head or loss of consciousness.  She did take Eliquis the night that she fell.  Overnight she became extremely combative and agitated after administration of IV morphine.  She was subsequently given IV Haldol given the agitation. She underwent surgical intervention 11/18/20 and is POD1. More awake but still a little confused and now in RVR this AM.   Assessment & Plan:   Principal Problem:   Femur fracture (HCC) Active Problems:   Hypothyroidism post removal of goiter   Atrial fibrillation (HCC)   Acute blood loss anemia  Right Femur Fracture s/p ORIF of Right Distal Femur Fracture and Right Femoral Shaft POD 1 -In the setting of Fall -No concern for Syncopal Episode -Orthopedic Surgery Consulted with plans for Surgical Intervention and this was done 11/18/20 by Dr. Jena GaussHaddix  -Judicious use of Narcotics -VTE Prophylaxis with Enoxaparin 30 mg sq q24h but now changed back  to Apixaban 5 mg po BID -Ortho Recommendind PWB RLE 50% and recommending that the Dressings -PT/OT to evaluate and Treat and recommending SNF -Pain Control with Acetaminophen 650 mg po q4hprn Mild Pain, Hydrocodone-Acetaminophen 5-325 mg po q4hprn Severe Pain and Tramadol 50 mg po q6hprn Moderate Pain, WILL NOT USE ANYMORE IV Morphine given her Agitation and combativeness -Vitamin D Level was 32.97  -C/w Robaxin 500 mg po/IV q6hprn Muscle Spasms -Gentle IVF Hydration with D5 NS at 75 mL/hr now stopped -Bowel Regimen with Senna 8.6 mg po 1 tab BID  -C/w Supportive Care  Permanent Atrial Fibrillation now with RVR -Hold Eliquis (Takes 5 mg po BID but will likley need to reduce to 2.5 mg po BID when re-ordered) -C/w with Diltaizem 120 mg po BID and Metoprolol Succinate 25 mg po BID -INR was 1.6 -Continue to Monitor on Telemetry and having RVR -Give IV Metoprolol 5 mg x1 and continue meds as above; If not improving will consider Cardizem gtt  Iatrogenic Hypothyroidism (Post Removal of Goiter) -Check TSH in the AM  -C/w Levothyroxine 125 mcg po Daily   Hyponatremia -Mild. Na+ went from 134 -> 132 -> 134 -> 135 -Will Stop Fluides  -Continue to Monitor and Trend -Repeat CMP in the AM   Acute Agitation and Confusion, improved  -Had significant aggression towards the staff and removed IV -In the setting of Narcotics, specifically IV Morphine 0.5 mg administration -Daughter states the patient had a similar episode 30 years ago after morphine -STOP Morphine -Given 2 mg of IV Haldol overnight -Delirium Precautions  CKD Stage 3a -BUN/Cr went from 21/0.95 -> 27/1.01 ->  14/0.68 -> 14/0.67 -IVF as above now stopped -Avoid Further Nephrotoxic Medications, Contrast Dyes, and Hypotension and Renally adjust Medications -Repeat CMP in the AM   Normocytic Anemia -Patient was likely Hemoconcentrated on Admission -Hgb/Hct went from 13.1/40.8 -> 8.0/24.7 -> 8.9/26.4 and ? Dilutional Drop; Expect to  Drop further -Checked Anemia Panel showed an iron level of 18, U IBC of 247, TIBC of 265, saturation ratios of 7%, ferritin level 98, folate level 9.4, vitamin B12 level 495 -We will start iron supplementation with ferrous sulfate 325 mg p.o. daily with breakfast -Continue to Monitor for S/Sx of Bleeding; No overt bleeding noted -Repeat CBC in the AM   Leukocytosis -Likely Reactive in the setting of Fall and Pain initially and now Surgery  -WBC went from 7.8 -> 11.9 -> 15.3 -Continue to Monitor for S/Sx of Infection -Currently Afebrile -Repeat CBC in the AM   Hypophosphatemia -Mild. Phos Level is now 2.2 -Replete with IV K Phos 10 mmol yesterday and now will replete with po K Phos 500 mg BID x2 -Continue to Monitor and Replete as Necessary -Repeat Phos Level in the AM  DVT prophylaxis: Enoxaparin 30 mg sq q24h Code Status: FULL CODE  Family Communication: Discussed with Daughter at bedside  Disposition Plan: SNF  Status is: Inpatient  Remains inpatient appropriate because:Unsafe d/c plan, IV treatments appropriate due to intensity of illness or inability to take PO, and Inpatient level of care appropriate due to severity of illness  Dispo: The patient is from: Home              Anticipated d/c is to: SNF              Patient currently is not medically stable to d/c.   Difficult to place patient No  Consultants:  Orthopedic surgery  Procedures: None  Antimicrobials:  Anti-infectives (From admission, onward)    Start     Dose/Rate Route Frequency Ordered Stop   11/19/20 0100  vancomycin (VANCOREADY) IVPB 1000 mg/200 mL        1,000 mg 200 mL/hr over 60 Minutes Intravenous Every 12 hours 11/18/20 1721 11/19/20 0049   11/18/20 1248  vancomycin (VANCOCIN) powder  Status:  Discontinued          As needed 11/18/20 1249 11/18/20 1532   11/18/20 1100  vancomycin (VANCOCIN) IVPB 1000 mg/200 mL premix        1,000 mg 200 mL/hr over 60 Minutes Intravenous On call to O.R. 11/17/20  2136 11/18/20 1350        Subjective: Seen and examined at bedside and is a little confused and a little agitated.  No nausea or vomiting.  Denies any real pain.  About to take her pills.  No other concerns or complaints at this time.  Objective: Vitals:   11/19/20 0100 11/19/20 0500 11/19/20 0700 11/19/20 1248  BP: 98/81 112/61 112/63 (!) 114/56  Pulse: (!) 109 92 98 83  Resp: 18 16 17 18   Temp: 98.8 F (37.1 C) 98.7 F (37.1 C) 99.2 F (37.3 C) 98 F (36.7 C)  TempSrc: Axillary Axillary Oral Oral  SpO2: 95% 93% 96% 95%  Weight:      Height:        Intake/Output Summary (Last 24 hours) at 11/19/2020 1408 Last data filed at 11/19/2020 0300 Gross per 24 hour  Intake 3821.67 ml  Output 400 ml  Net 3421.67 ml    Filed Weights   11/16/20 1049 11/18/20 1210  Weight: 65.8 kg 65.8  kg   Examination: Physical Exam:  Constitutional: Patient is a thin elderly Caucasian female currently in no acute distress but appears a little frustrated about to take her pills  Eyes:  Lids and conjunctivae normal, sclerae anicteric  ENMT: External Ears, Nose appear normal.  She is hard of hearing Neck: Appears normal, supple, no cervical masses, normal ROM, no appreciable thyromegaly; no appreciable JVD Respiratory: Diminished to auscultation bilaterally, no wheezing, rales, rhonchi or crackles. Normal respiratory effort and patient is not tachypenic. No accessory muscle use.  Unlabored breathing Cardiovascular: Irregularly irregular and tachycardic rate, no murmurs / rubs / gallops. S1 and S2 auscultated.  No appreciable extremity edema. Abdomen: Soft, non-tender, non-distended. Bowel sounds positive.  GU: Deferred. Musculoskeletal: No clubbing / cyanosis of digits/nails.  Right leg is wrapped in an Ace bandage Skin: No rashes, lesions, ulcers on limited skin evaluation. No induration; Warm and dry.  Neurologic: CN 2-12 grossly intact with no focal deficits. Romberg sign and cerebellar reflexes  not assessed.  Psychiatric: Normal judgment and insight. Alert and oriented x 3. Normal mood and appropriate affect.   Data Reviewed: I have personally reviewed following labs and imaging studies  CBC: Recent Labs  Lab 11/16/20 0005 11/18/20 0950 11/19/20 0458  WBC 7.8 11.9* 15.3*  NEUTROABS 5.6 9.2* 11.6*  HGB 13.1 8.0* 8.9*  HCT 40.8 24.7* 26.4*  MCV 93.6 93.2 92.3  PLT 316 281 294    Basic Metabolic Panel: Recent Labs  Lab 11/16/20 0005 11/17/20 0600 11/18/20 0950 11/19/20 0458  NA 134* 132* 134* 135  K 4.2 4.3 4.2 4.2  CL 100 96* 101 103  CO2 26 29 27 26   GLUCOSE 136* 112* 148* 151*  BUN 21 27* 14 14  CREATININE 0.95 1.01* 0.68 0.67  CALCIUM 9.5 9.8 9.0 9.0  MG  --   --  1.8 1.7  PHOS  --   --  2.4* 2.2*    GFR: Estimated Creatinine Clearance: 39.1 mL/min (by C-G formula based on SCr of 0.67 mg/dL). Liver Function Tests: Recent Labs  Lab 11/18/20 0950 11/19/20 0458  AST 27 34  ALT 12 14  ALKPHOS 43 43  BILITOT 0.7 1.2  PROT 5.6* 5.5*  ALBUMIN 3.0* 2.9*    No results for input(s): LIPASE, AMYLASE in the last 168 hours. No results for input(s): AMMONIA in the last 168 hours. Coagulation Profile: Recent Labs  Lab 11/16/20 0005  INR 1.6*    Cardiac Enzymes: No results for input(s): CKTOTAL, CKMB, CKMBINDEX, TROPONINI in the last 168 hours. BNP (last 3 results) No results for input(s): PROBNP in the last 8760 hours. HbA1C: No results for input(s): HGBA1C in the last 72 hours. CBG: No results for input(s): GLUCAP in the last 168 hours. Lipid Profile: No results for input(s): CHOL, HDL, LDLCALC, TRIG, CHOLHDL, LDLDIRECT in the last 72 hours. Thyroid Function Tests: No results for input(s): TSH, T4TOTAL, FREET4, T3FREE, THYROIDAB in the last 72 hours. Anemia Panel: Recent Labs    11/19/20 0458  VITAMINB12 495  FOLATE 9.4  FERRITIN 98  TIBC 265  IRON 18*  RETICCTPCT 3.0   Sepsis Labs: No results for input(s): PROCALCITON, LATICACIDVEN  in the last 168 hours.  Recent Results (from the past 240 hour(s))  Resp Panel by RT-PCR (Flu A&B, Covid) Nasopharyngeal Swab     Status: None   Collection Time: 11/16/20  1:25 AM   Specimen: Nasopharyngeal Swab; Nasopharyngeal(NP) swabs in vial transport medium  Result Value Ref Range Status   SARS  Coronavirus 2 by RT PCR NEGATIVE NEGATIVE Final    Comment: (NOTE) SARS-CoV-2 target nucleic acids are NOT DETECTED.  The SARS-CoV-2 RNA is generally detectable in upper respiratory specimens during the acute phase of infection. The lowest concentration of SARS-CoV-2 viral copies this assay can detect is 138 copies/mL. A negative result does not preclude SARS-Cov-2 infection and should not be used as the sole basis for treatment or other patient management decisions. A negative result may occur with  improper specimen collection/handling, submission of specimen other than nasopharyngeal swab, presence of viral mutation(s) within the areas targeted by this assay, and inadequate number of viral copies(<138 copies/mL). A negative result must be combined with clinical observations, patient history, and epidemiological information. The expected result is Negative.  Fact Sheet for Patients:  BloggerCourse.com  Fact Sheet for Healthcare Providers:  SeriousBroker.it  This test is no t yet approved or cleared by the [place] FDA and  has been authorized for detection and/or diagnosis of SARS-CoV-2 by FDA under an Emergency Use Authorization (EUA). This EUA will remain  in effect (meaning this test can be used) for the duration of the COVID-19 declaration under Section 564(b)(1) of the Act, 21 U.S.C.section 360bbb-3(b)(1), unless the authorization is terminated  or revoked sooner.       Influenza A by PCR NEGATIVE NEGATIVE Final   Influenza B by PCR NEGATIVE NEGATIVE Final    Comment: (NOTE) The Xpert Xpress SARS-CoV-2/FLU/RSV plus  assay is intended as an aid in the diagnosis of influenza from Nasopharyngeal swab specimens and should not be used as a sole basis for treatment. Nasal washings and aspirates are unacceptable for Xpert Xpress SARS-CoV-2/FLU/RSV testing.  Fact Sheet for Patients: BloggerCourse.com  Fact Sheet for Healthcare Providers: SeriousBroker.it  This test is not yet approved or cleared by the [place] FDA and has been authorized for detection and/or diagnosis of SARS-CoV-2 by FDA under an Emergency Use Authorization (EUA). This EUA will remain in effect (meaning this test can be used) for the duration of the COVID-19 declaration under Section 564(b)(1) of the Act, 21 U.S.C. section 360bbb-3(b)(1), unless the authorization is terminated or revoked.  Performed at [facility], [address] [place], [place] [address]   Surgical PCR screen     Status: None   Collection Time: 11/16/20 10:52 AM   Specimen: Nasal Mucosa; Nasal Swab  Result Value Ref Range Status   MRSA, PCR NEGATIVE NEGATIVE Final   Staphylococcus aureus NEGATIVE NEGATIVE Final    Comment: (NOTE) The Xpert SA Assay (FDA approved for NASAL specimens in patients 73 years of age and older), is one component of a comprehensive surveillance program. It is not intended to diagnose infection nor to guide or monitor treatment. Performed at [facility], [address] [place], [place] [address]      RN Pressure Injury Documentation:     Estimated body mass index is 26.53 kg/m as calculated from the following:   Height as of this encounter:  (1.575 m).   Weight as of this encounter: 65.8 kg.  Malnutrition Type: Nutrition Problem: Inadequate oral intake Etiology: decreased appetite Malnutrition Characteristics: Signs/Symptoms: per patient/family report Nutrition Interventions: Interventions: Boost Plus, MVI Radiology Studies: DG C-Arm 1-60  Min  Result Date: 11/18/2020 CLINICAL DATA:  ORIF distal femur fracture. EXAM: DG C-ARM 1-60 MIN; RIGHT FEMUR 2 VIEWS FLUOROSCOPY TIME:  Fluoroscopy Time:  2 minutes Radiation Exposure Index (if provided by the fluoroscopic device): 4.98 mGy. Number of Acquired Spot  Images: 12 COMPARISON:  November 16, 2020. FINDINGS: Twelve C-arm fluoroscopic images were obtained intraoperatively and submitted for post operative interpretation. These images demonstrate fixation of a a mid femoral shaft fracture with plate and screws and cerclage wires. Improved alignment, near anatomic. Partially imaged right total hip and right total knee arthroplasties. Expected surrounding soft tissue swelling and gas. Please see the performing provider's procedural report for further detail. IMPRESSION: Intraoperative fluoroscopy, as detailed above. Electronically Signed   By: Feliberto Harts MD   On: 11/18/2020 15:42   DG FEMUR, MIN 2 VIEWS RIGHT  Result Date: 11/18/2020 CLINICAL DATA:  ORIF distal femur fracture. EXAM: DG C-ARM 1-60 MIN; RIGHT FEMUR 2 VIEWS FLUOROSCOPY TIME:  Fluoroscopy Time:  2 minutes Radiation Exposure Index (if provided by the fluoroscopic device): 4.98 mGy. Number of Acquired Spot Images: 12 COMPARISON:  November 16, 2020. FINDINGS: Twelve C-arm fluoroscopic images were obtained intraoperatively and submitted for post operative interpretation. These images demonstrate fixation of a a mid femoral shaft fracture with plate and screws and cerclage wires. Improved alignment, near anatomic. Partially imaged right total hip and right total knee arthroplasties. Expected surrounding soft tissue swelling and gas. Please see the performing provider's procedural report for further detail. IMPRESSION: Intraoperative fluoroscopy, as detailed above. Electronically Signed   By: Feliberto Harts MD   On: 11/18/2020 15:42   DG FEMUR PORT, MIN 2 VIEWS RIGHT  Result Date: 11/18/2020 CLINICAL DATA:  Femur fracture EXAM: RIGHT FEMUR  PORTABLE 2 VIEW COMPARISON:  11/18/2020 FINDINGS: Patient is status post right hip and knee replacements. Interval surgical plate and multiple screw fixation of the femur for mid to distal femoral shaft fracture. Mild residual displacement but overall decreased angulation and displacement compared to prior. Placement of cerclage wires about the mid femoral shaft. Slight gap between the fixating plate and cortex of femur measuring to 3 mm maximum at the level of the midshaft. Gas in the soft tissues consistent with recent surgery. IMPRESSION: Prior right hip and knee replacements with interval surgical plate and multiple screw fixation of the right femur for mid to distal femoral shaft fracture. Electronically Signed   By: Jasmine Pang M.D.   On: 11/18/2020 19:15    Scheduled Meds:  sodium chloride   Intravenous Once   sodium chloride   Intravenous Once   apixaban  5 mg Oral BID   diltiazem  120 mg Oral BID   docusate sodium  100 mg Oral BID   ferrous sulfate  325 mg Oral Q breakfast   lactose free nutrition  237 mL Oral TID WC   levothyroxine  125 mcg Oral Q0600   metoprolol succinate  25 mg Oral BID   mirtazapine  7.5 mg Oral QHS   multivitamin with minerals  1 tablet Oral Daily   senna  1 tablet Oral BID   Continuous Infusions:  dextrose 5 % and 0.9 % NaCl with KCl 20 mEq/L 75 mL/hr at 11/19/20 0523   magnesium sulfate bolus IVPB     methocarbamol (ROBAXIN) IV     potassium PHOSPHATE IVPB (in mmol)      LOS: 3 days   Merlene Laughter, DO [facility] PAGER is on AMION  If 7PM-7AM, please contact night-coverage [email or web address]

## 2020-11-19 NOTE — Progress Notes (Signed)
Paged Hospitalist on call about pt's HR sustaining in the 130's and reaching as high as the 150's

## 2020-11-19 NOTE — Anesthesia Postprocedure Evaluation (Signed)
Anesthesia Post Note  Patient: Katrina Velez  Procedure(s) Performed: OPEN REDUCTION INTERNAL FIXATION (ORIF) DISTAL FEMUR FRACTURE (Right: Leg Upper)     Patient location during evaluation: PACU Anesthesia Type: General Level of consciousness: awake and alert Pain management: pain level controlled Vital Signs Assessment: post-procedure vital signs reviewed and stable Respiratory status: spontaneous breathing, nonlabored ventilation and respiratory function stable Cardiovascular status: blood pressure returned to baseline and stable Postop Assessment: no apparent nausea or vomiting Anesthetic complications: no   No notable events documented.  Last Vitals:  Vitals:   11/19/20 1248 11/19/20 1632  BP: (!) 114/56 (!) 115/59  Pulse: 83 99  Resp: 18 17  Temp: 36.7 C 36.5 C  SpO2: 95% 95%    Last Pain:  Vitals:   11/19/20 1632  TempSrc: Oral  PainSc:    Pain Goal:                   Lowella Curb

## 2020-11-19 NOTE — Progress Notes (Signed)
ANTICOAGULATION CONSULT NOTE - Initial Consult  Pharmacy Consult:  Fracture care post-op AC Indication: atrial fibrillation  Allergies  Allergen Reactions   Banana Nausea Only    All banana products also   Penicillins Hives    Patient Measurements: Height: 5\' 2"  (157.5 cm) Weight: 65.8 kg (145 lb 1 oz) IBW/kg (Calculated) : 50.1  Vital Signs: Temp: 99.2 F (37.3 C) (07/01 0700) Temp Source: Oral (07/01 0700) BP: 112/63 (07/01 0700) Pulse Rate: 98 (07/01 0700)  Labs: Recent Labs    11/17/20 0600 11/18/20 0950 11/19/20 0458  HGB  --  8.0* 8.9*  HCT  --  24.7* 26.4*  PLT  --  281 294  CREATININE 1.01* 0.68 0.67    Estimated Creatinine Clearance: 39.1 mL/min (by C-G formula based on SCr of 0.67 mg/dL).   Medical History: Past Medical History:  Diagnosis Date   A-fib (HCC) 05/2015   HOSPITALIZED    Arthritis    Cataract    Hypothyroidism    Permanent atrial fibrillation (HCC)    Thyroid disease      Assessment: 11 YOF with history of AFib on Eliquis PTA.  Patient presented s/p fall and required ORIF of R distal femur fracture and femoral shaft on 6/30.  Pharmacy consulted to resume Eliquis.  Patient is anemic and Ortho plans to add iron.  No overt bleeding reported.  Goal of Therapy:  Appropriate anticoagulation Monitor platelets by anticoagulation protocol: Yes   Plan:  Resume Eliquis 5mg  PO BID Pharmacy will sign off and follow peripherally.  Thank you for the consult!  Natilie Krabbenhoft D. 7/30, PharmD, BCPS, BCCCP 11/19/2020, 7:53 AM

## 2020-11-20 DIAGNOSIS — D500 Iron deficiency anemia secondary to blood loss (chronic): Secondary | ICD-10-CM

## 2020-11-20 DIAGNOSIS — D62 Acute posthemorrhagic anemia: Secondary | ICD-10-CM

## 2020-11-20 LAB — CBC WITH DIFFERENTIAL/PLATELET
Abs Immature Granulocytes: 0.07 10*3/uL (ref 0.00–0.07)
Basophils Absolute: 0 10*3/uL (ref 0.0–0.1)
Basophils Relative: 0 %
Eosinophils Absolute: 0 10*3/uL (ref 0.0–0.5)
Eosinophils Relative: 0 %
HCT: 24.4 % — ABNORMAL LOW (ref 36.0–46.0)
Hemoglobin: 8.1 g/dL — ABNORMAL LOW (ref 12.0–15.0)
Immature Granulocytes: 0 %
Lymphocytes Relative: 6 %
Lymphs Abs: 0.9 10*3/uL (ref 0.7–4.0)
MCH: 30.7 pg (ref 26.0–34.0)
MCHC: 33.2 g/dL (ref 30.0–36.0)
MCV: 92.4 fL (ref 80.0–100.0)
Monocytes Absolute: 2.7 10*3/uL — ABNORMAL HIGH (ref 0.1–1.0)
Monocytes Relative: 17 %
Neutro Abs: 12.1 10*3/uL — ABNORMAL HIGH (ref 1.7–7.7)
Neutrophils Relative %: 77 %
Platelets: 323 10*3/uL (ref 150–400)
RBC: 2.64 MIL/uL — ABNORMAL LOW (ref 3.87–5.11)
RDW: 14.2 % (ref 11.5–15.5)
WBC: 15.8 10*3/uL — ABNORMAL HIGH (ref 4.0–10.5)
nRBC: 0 % (ref 0.0–0.2)

## 2020-11-20 LAB — COMPREHENSIVE METABOLIC PANEL
ALT: 18 U/L (ref 0–44)
AST: 32 U/L (ref 15–41)
Albumin: 2.8 g/dL — ABNORMAL LOW (ref 3.5–5.0)
Alkaline Phosphatase: 41 U/L (ref 38–126)
Anion gap: 8 (ref 5–15)
BUN: 16 mg/dL (ref 8–23)
CO2: 24 mmol/L (ref 22–32)
Calcium: 8.9 mg/dL (ref 8.9–10.3)
Chloride: 102 mmol/L (ref 98–111)
Creatinine, Ser: 0.62 mg/dL (ref 0.44–1.00)
GFR, Estimated: >60 mL/min (ref >60)
Glucose, Bld: 130 mg/dL — ABNORMAL HIGH (ref 70–99)
Potassium: 3.7 mmol/L (ref 3.5–5.1)
Sodium: 134 mmol/L — ABNORMAL LOW (ref 135–145)
Total Bilirubin: 1.7 mg/dL — ABNORMAL HIGH (ref 0.3–1.2)
Total Protein: 5.5 g/dL — ABNORMAL LOW (ref 6.5–8.1)

## 2020-11-20 LAB — TSH: TSH: 0.263 u[IU]/mL — ABNORMAL LOW (ref 0.350–4.500)

## 2020-11-20 LAB — PHOSPHORUS: Phosphorus: 3.4 mg/dL (ref 2.5–4.6)

## 2020-11-20 LAB — MAGNESIUM: Magnesium: 2 mg/dL (ref 1.7–2.4)

## 2020-11-20 MED ORDER — METOPROLOL TARTRATE 5 MG/5ML IV SOLN
5.0000 mg | INTRAVENOUS | Status: AC | PRN
  Administered 2020-11-21 (×2): 5 mg via INTRAVENOUS
  Filled 2020-11-20 (×2): qty 5

## 2020-11-20 MED ORDER — SODIUM CHLORIDE 0.9 % IV BOLUS
500.0000 mL | Freq: Once | INTRAVENOUS | Status: AC
  Administered 2020-11-20: 500 mL via INTRAVENOUS

## 2020-11-20 NOTE — Progress Notes (Signed)
Patient's HR has been up and down going as high as 170.  Already gave evening Cardizem.  Contacted on call through Amion.  Received new order for Metoprolol 5 mg iv PRN (with parameters).

## 2020-11-20 NOTE — Progress Notes (Signed)
SPORTS MEDICINE AND JOINT REPLACEMENT  Katrina Spurling, MD    Katrina Nancy, PA-C [address] [place], [place], [place]  [address]                             [phone]   PROGRESS NOTE  Subjective:  negative for Chest Pain  negative for Shortness of Breath  negative for Nausea/Vomiting   negative for Calf Pain  negative for Bowel Movement   Tolerating Diet: yes         Patient reports pain as 3 on 0-10 scale.    Objective: Vital signs in last 24 hours:   Patient Vitals for the past 24 hrs:  BP Temp Temp src Pulse Resp SpO2  11/20/20 0459 120/74 98.2 F (36.8 C) Axillary (!) 110 16 --  11/20/20 0030 120/70 98.6 F (37 C) Oral (!) 102 16 96 %  11/19/20 2256 -- -- -- (!) 117 -- --  11/19/20 2039 120/70 99 F (37.2 C) -- (!) 127 16 94 %  11/19/20 1632 (!) 115/59 97.7 F (36.5 C) Oral 99 17 95 %  11/19/20 1248 (!) 114/56 98 F (36.7 C) Oral 83 18 95 %    @flow {1959:LAST@   Intake/Output from previous day:   07/01 0701 - 07/02 0700 In: -  Out: 250 [Urine:250]   Intake/Output this shift:   No intake/output data recorded.   Intake/Output      07/01 0701 07/02 0700 07/02 0701 07/03 0700   I.V. (mL/kg)     Blood     IV Piggyback     Total Intake(mL/kg)     Urine (mL/kg/hr) 250 (0.2)    Blood     Total Output 250    Net -250         Urine Occurrence 1 x       LABORATORY DATA: Recent Labs    11/16/20 0005 11/18/20 0950 11/19/20 0458 11/20/20 0202  WBC 7.8 11.9* 15.3* 15.8*  HGB 13.1 8.0* 8.9* 8.1*  HCT 40.8 24.7* 26.4* 24.4*  PLT 316 281 294 323   Recent Labs    11/16/20 0005 11/17/20 0600 11/18/20 0950 11/19/20 0458 11/20/20 0202  NA 134* 132* 134* 135 134*  K 4.2 4.3 4.2 4.2 3.7  CL 100 96* 101 103 102  CO2 26 29 27 26 24   BUN 21 27* 14 14 16   CREATININE 0.95 1.01* 0.68 0.67 0.62  GLUCOSE 136* 112* 148* 151* 130*  CALCIUM 9.5 9.8 9.0 9.0 8.9   Lab Results  Component Value Date   INR 1.6 (H) 11/16/2020   INR 1.14 06/05/2015   INR 1.73  (H) 01/08/2010    Examination:  General appearance: alert, cooperative, and no distress Extremities: extremities normal, atraumatic, no cyanosis or edema  Wound Exam: clean, dry, intact   Drainage:  None: wound tissue dry  Motor Exam: Quadriceps and Hamstrings Intact  Sensory Exam: Superficial Peroneal, Deep Peroneal, and Tibial normal   Assessment:    2 Days Post-Op  Procedure(s) (LRB): OPEN REDUCTION INTERNAL FIXATION (ORIF) DISTAL FEMUR FRACTURE (Right)  ADDITIONAL DIAGNOSIS:  Principal Problem:   Femur fracture (HCC) Active Problems:   Hypothyroidism post removal of goiter   Atrial fibrillation (HCC)   Acute blood loss anemia     Plan: Physical Therapy as ordered Weight Bearing as Tolerated (WBAT)   ROM: Ok for knee and hip motion as tolerated Incisional and dressing care: dressings removed, no drainage, bruising  noted Showering: Ok to begin showering with assistance 11/21/20 if no incisional drainage Orthopedic device(s): None  Pain management: 1. Tylenol 650 mg q 4 hours PRN 2. Robaxin 500 mg q 6 hours PRN 3. Tramadol 50 mg q 6 hours PRN 4. Dilaudid 1 mg q 3 hours PRN VTE prophylaxis:  Eliquis , SCDs ID: Vancomycin post op Foley/Lines:  No foley, KVO IVFs Impediments to Fracture Healing: Vit D level 32, no supplementation needed Dispo dispo pending. Okay for discharge from ortho standpoint once cleared by medicine team and therapies Follow - up plan: 2 weeks with Dr Jena Gauss      Guy Sandifer 11/20/2020, 7:24 AM

## 2020-11-20 NOTE — Progress Notes (Signed)
Hydrocolloid dressing removed by surgeon.  Patient found to be scratching at area.  Foam dressing applied to discourage injury to area.  Will continue to monitor.

## 2020-11-20 NOTE — TOC Progression Note (Addendum)
Transition of Care [facility]) - Progression Note    Patient Details  Name: Katrina Velez MRN: [id number] Date of Birth: 1926-03-03  Transition of Care [facility]) CM/SW Contact  Levada Schilling Phone Number: 11/20/2020, 2:11 PM  Clinical Narrative:    CSW spoke with pt's daughter and son concerning SNF choice.  CSW gave pt's daughter SNF choices and website address for medicare .gov.  SNF list from medicare.gov was placed on pt's chart.  Pt's daughter and son will give CSW SNF choice on Sunday after reviewing. TOC will continue to assist with disposition planning. 3:03pm Pt's daughter contacted CSW.  Pt's family would like other  SNF choices sent out in hub due to low ratings.  CSW explained accepting SNF have bed availability.  CSW expanded SNF search including [place], [place], [place] as requested per family.    Expected Discharge Plan: Skilled Nursing Facility Barriers to Discharge: Continued Medical Work up  Expected Discharge Plan and Services Expected Discharge Plan: Skilled Nursing Facility                                               Social Determinants of Health (SDOH) Interventions    Readmission Risk Interventions No flowsheet data found.

## 2020-11-20 NOTE — Progress Notes (Signed)
PROGRESS NOTE    Katrina Velez  BJS:[id number] DOB: [birth year] DOA: 11/15/2020 PCP: System, Provider Not In   Brief Narrative:  The patient is a 87 year old elderly Caucasian female with a past medical history significant for but not limited to permanent atrial fibrillation on anticoagulation with apixaban, hypothyroidism, osteoarthritis, depression, spinal stenosis of his lumbar spine with neurogenic claudication as well as other comorbidities presented to the ED via EMS complaining of right thigh pain after mechanical fall.  She had no significant abnormalities noted on her laboratory work.  She screened negative for COVID.  X-ray did of the hip showed mild right femoral shaft fracture with overlapping angulated and displaced fragments.  Chest x-ray showed no active disease and the EDP spoke to the orthopedic surgeon who consulted and are planning for surgical intervention on Thursday.  Patient was walking from her bedroom to the kitchen to take her evening medications and slept and fell down to the floor.  She denied hitting her head or loss of consciousness.  She did take Eliquis the night that she fell.  Overnight she became extremely combative and agitated after administration of IV morphine.  She was subsequently given IV Haldol given the agitation. She underwent surgical intervention 11/18/20 and is POD2. More awake remains confused and RVR is not as pronounced with heart rates in the low to mid 100 range.   Assessment & Plan:   Principal Problem:   Femur fracture (HCC) Active Problems:   Hypothyroidism post removal of goiter   Atrial fibrillation (HCC)   Acute blood loss anemia  Right Femur Fracture s/p ORIF of Right Distal Femur Fracture and Right Femoral Shaft POD 2 -In the setting of Fall -No concern for Syncopal Episode -Orthopedic Surgery Consulted with plans for Surgical Intervention and this was done 11/18/20 by Dr. Jena Gauss  -Judicious use of Narcotics -VTE Prophylaxis with  Enoxaparin 30 mg sq q24h but now changed back to Apixaban 5 mg po BID -Ortho Recommendind PWB RLE 50% and recommending that the Dressings -PT/OT to evaluate and Treat and recommending SNF -Pain Control with Acetaminophen 650 mg po q4hprn Mild Pain, Hydrocodone-Acetaminophen 5-325 mg po q4hprn Severe Pain and Tramadol 50 mg po q6hprn Moderate Pain, WILL NOT USE ANYMORE IV Morphine given her Agitation and combativeness -Vitamin D Level was 32.97  -C/w Robaxin 500 mg po/IV q6hprn Muscle Spasms -Gentle IVF Hydration with D5 NS at 75 mL/hr now stopped -Bowel Regimen with Senna 8.6 mg po 1 tab BID  -C/w Supportive Care  Permanent Atrial Fibrillation now with RVR -Hold Eliquis (Takes 5 mg po BID but will likley need to reduce to 2.5 mg po BID when re-ordered) -C/w with Diltaizem 120 mg po BID and Metoprolol Succinate 25 mg po BID -INR was 1.6 -Continue to Monitor on Telemetry and having RVR -Give IV Metoprolol 5 mg x1 yesterday and repeated overnight and continue meds as above; If not improving will consider Cardizem gtt  Iatrogenic Hypothyroidism (Post Removal of Goiter) -Checked TSH and was 0.263; Now will Check Free T4 and may consider adjustments of Levothyroxine  -C/w Levothyroxine 125 mcg po Daily   Hyponatremia -Mild. Na+ went from 134 -> 132 -> 134 -> 135 -> 134 -Will Stop Fluids  -Continue to Monitor and Trend -Repeat CMP in the AM   Acute Agitation and Confusion, improved  -Had significant aggression towards the staff and removed IV -In the setting of Narcotics, specifically IV Morphine 0.5 mg administration -Daughter states the patient had a similar episode 30 years  ago after morphine -STOP Morphine -Given 2 mg of IV Haldol overnight -Delirium Precautions  CKD Stage 3a -BUN/Cr went from 21/0.95 -> 27/1.01 -> 14/0.68 -> 14/0.67 -> 16/0.62 -IVF as above now stopped -Avoid Further Nephrotoxic Medications, Contrast Dyes, and Hypotension and Renally adjust Medications -Repeat  CMP in the AM   Normocytic Anemia -Patient was likely Hemoconcentrated on Admission -Hgb/Hct went from 13.1/40.8 -> 8.0/24.7 -> 8.9/26.4 -> 8.1/24.4 and ? Dilutional Drop and post Surgical Drop; Expect to Drop further -Checked Anemia Panel showed an iron level of 18, U IBC of 247, TIBC of 265, saturation ratios of 7%, ferritin level 98, folate level 9.4, vitamin B12 level 495 -We will start iron supplementation with ferrous sulfate 325 mg p.o. daily with breakfast -Continue to Monitor for S/Sx of Bleeding; No overt bleeding noted -Repeat CBC in the AM   Leukocytosis -Likely Reactive in the setting of Fall and Pain initially and now Surgery  -WBC went from 7.8 -> 11.9 -> 15.3 -> 15.8 -Continue to Monitor for S/Sx of Infection -Currently Afebrile but had a Tmax of 99.2; Currently Temp is 98.1 -Repeat CBC in the AM   Hypophosphatemia -Mild. Phos Level was 2.2 and now is 3.4 -Continue to Monitor and Replete as Necessary -Repeat Phos Level in the AM  Hyperbilirubinemia -Mild and Likely Reactive -Patient's T Bili went from 1.2 -> 1.7 -Continue to Monitor and Trend -Repeat CMP in the AM   DVT prophylaxis: Enoxaparin 30 mg sq q24h Code Status: FULL CODE  Family Communication: Discussed with Daughter and Son at bedside  Disposition Plan: SNF when bed available and Insurance Auth is obtained  Status is: Inpatient  Remains inpatient appropriate because:Unsafe d/c plan, IV treatments appropriate due to intensity of illness or inability to take PO, and Inpatient level of care appropriate due to severity of illness  Dispo: The patient is from: Home              Anticipated d/c is to: SNF              Patient currently is not medically stable to d/c.   Difficult to place patient No  Consultants:  Orthopedic surgery  Procedures: None  Antimicrobials:  Anti-infectives (From admission, onward)    Start     Dose/Rate Route Frequency Ordered Stop   11/19/20 0100  vancomycin  (VANCOREADY) IVPB 1000 mg/200 mL        1,000 mg 200 mL/hr over 60 Minutes Intravenous Every 12 hours 11/18/20 1721 11/19/20 0049   11/18/20 1248  vancomycin (VANCOCIN) powder  Status:  Discontinued          As needed 11/18/20 1249 11/18/20 1532   11/18/20 1100  vancomycin (VANCOCIN) IVPB 1000 mg/200 mL premix        1,000 mg 200 mL/hr over 60 Minutes Intravenous On call to O.R. 11/17/20 2136 11/18/20 1350        Subjective: Seen and examined at bedside and still remains confused but is extremely hard of hearing without her hearing aids.  Denies any pain.  No nausea or vomiting. HR is improved.  No other concerns or complaints this time.  Objective: Vitals:   11/19/20 2256 11/20/20 0030 11/20/20 0459 11/20/20 0731  BP:  120/70 120/74 111/77  Pulse: (!) 117 (!) 102 (!) 110 (!) 104  Resp:  16 16 18   Temp:  98.6 F (37 C) 98.2 F (36.8 C) 98.1 F (36.7 C)  TempSrc:  Oral Axillary Oral  SpO2:  96%  93%  Weight:      Height:        Intake/Output Summary (Last 24 hours) at 11/20/2020 1224 Last data filed at 11/20/2020 4580 Gross per 24 hour  Intake --  Output 250 ml  Net -250 ml    Filed Weights   11/16/20 1049 11/18/20 1210  Weight: 65.8 kg 65.8 kg   Examination: Physical Exam:  Constitutional: The patient is a thin elderly Caucasian female currently in no acute distress but continues to be little confused.   Eyes: Lids and conjunctivae normal, sclerae anicteric  ENMT: External Ears, Nose appear normal.  Patient remains extremely hard of hearing without her hearing aids Neck: Appears normal, supple, no cervical masses, normal ROM, no appreciable thyromegaly; no JVD Respiratory: Diminished to auscultation bilaterally with coarse breath sounds, no wheezing, rales, rhonchi or crackles. Normal respiratory effort and patient is not tachypenic. No accessory muscle use. Wearing Supplemental O2 via [place] Cardiovascular: RRR, no murmurs / rubs / gallops. S1 and S2 auscultated. No  extremity edema. 2+ pedal pulses. No carotid bruits.  Abdomen: Soft, non-tender, non-distended. Bowel sounds positive.  GU: Deferred. Musculoskeletal: No clubbing / cyanosis of digits/nails.  Right leg is wrapped in Ace bandage Skin: No rashes, lesions, ulcers on limited skin evaluation. No induration; Warm and dry.  Neurologic: CN 2-12 grossly intact with no focal deficits. Romberg sign and cerebellar reflexes not assessed.  Psychiatric: Impaired judgment and insight. Alert and oriented x 1. Normal mood and appropriate affect.   Data Reviewed: I have personally reviewed following labs and imaging studies  CBC: Recent Labs  Lab 11/16/20 0005 11/18/20 0950 11/19/20 0458 11/20/20 0202  WBC 7.8 11.9* 15.3* 15.8*  NEUTROABS 5.6 9.2* 11.6* 12.1*  HGB 13.1 8.0* 8.9* 8.1*  HCT 40.8 24.7* 26.4* 24.4*  MCV 93.6 93.2 92.3 92.4  PLT 316 281 294 323    Basic Metabolic Panel: Recent Labs  Lab 11/16/20 0005 11/17/20 0600 11/18/20 0950 11/19/20 0458 11/20/20 0202  NA 134* 132* 134* 135 134*  K 4.2 4.3 4.2 4.2 3.7  CL 100 96* 101 103 102  CO2 26 29 27 26 24   GLUCOSE 136* 112* 148* 151* 130*  BUN 21 27* 14 14 16   CREATININE 0.95 1.01* 0.68 0.67 0.62  CALCIUM 9.5 9.8 9.0 9.0 8.9  MG  --   --  1.8 1.7 2.0  PHOS  --   --  2.4* 2.2* 3.4    GFR: Estimated Creatinine Clearance: 39.1 mL/min (by C-G formula based on SCr of 0.62 mg/dL). Liver Function Tests: Recent Labs  Lab 11/18/20 0950 11/19/20 0458 11/20/20 0202  AST 27 34 32  ALT 12 14 18   ALKPHOS 43 43 41  BILITOT 0.7 1.2 1.7*  PROT 5.6* 5.5* 5.5*  ALBUMIN 3.0* 2.9* 2.8*    No results for input(s): LIPASE, AMYLASE in the last 168 hours. No results for input(s): AMMONIA in the last 168 hours. Coagulation Profile: Recent Labs  Lab 11/16/20 0005  INR 1.6*    Cardiac Enzymes: No results for input(s): CKTOTAL, CKMB, CKMBINDEX, TROPONINI in the last 168 hours. BNP (last 3 results) No results for input(s): PROBNP in the  last 8760 hours. HbA1C: No results for input(s): HGBA1C in the last 72 hours. CBG: No results for input(s): GLUCAP in the last 168 hours. Lipid Profile: No results for input(s): CHOL, HDL, LDLCALC, TRIG, CHOLHDL, LDLDIRECT in the last 72 hours. Thyroid Function Tests: Recent Labs    11/20/20 0202  TSH 0.263*  Anemia Panel: Recent Labs    11/19/20 0458  VITAMINB12 495  FOLATE 9.4  FERRITIN 98  TIBC 265  IRON 18*  RETICCTPCT 3.0    Sepsis Labs: No results for input(s): PROCALCITON, LATICACIDVEN in the last 168 hours.  Recent Results (from the past 240 hour(s))  Resp Panel by RT-PCR (Flu A&B, Covid) Nasopharyngeal Swab     Status: None   Collection Time: 11/16/20  1:25 AM   Specimen: Nasopharyngeal Swab; Nasopharyngeal(NP) swabs in vial transport medium  Result Value Ref Range Status   SARS Coronavirus 2 by RT PCR NEGATIVE NEGATIVE Final    Comment: (NOTE) SARS-CoV-2 target nucleic acids are NOT DETECTED.  The SARS-CoV-2 RNA is generally detectable in upper respiratory specimens during the acute phase of infection. The lowest concentration of SARS-CoV-2 viral copies this assay can detect is 138 copies/mL. A negative result does not preclude SARS-Cov-2 infection and should not be used as the sole basis for treatment or other patient management decisions. A negative result may occur with  improper specimen collection/handling, submission of specimen other than nasopharyngeal swab, presence of viral mutation(s) within the areas targeted by this assay, and inadequate number of viral copies(<138 copies/mL). A negative result must be combined with clinical observations, patient history, and epidemiological information. The expected result is Negative.  Fact Sheet for Patients:  BloggerCourse.com  Fact Sheet for Healthcare Providers:  SeriousBroker.it  This test is no t yet approved or cleared by the [place] FDA and   has been authorized for detection and/or diagnosis of SARS-CoV-2 by FDA under an Emergency Use Authorization (EUA). This EUA will remain  in effect (meaning this test can be used) for the duration of the COVID-19 declaration under Section 564(b)(1) of the Act, 21 U.S.C.section 360bbb-3(b)(1), unless the authorization is terminated  or revoked sooner.       Influenza A by PCR NEGATIVE NEGATIVE Final   Influenza B by PCR NEGATIVE NEGATIVE Final    Comment: (NOTE) The Xpert Xpress SARS-CoV-2/FLU/RSV plus assay is intended as an aid in the diagnosis of influenza from Nasopharyngeal swab specimens and should not be used as a sole basis for treatment. Nasal washings and aspirates are unacceptable for Xpert Xpress SARS-CoV-2/FLU/RSV testing.  Fact Sheet for Patients: BloggerCourse.com  Fact Sheet for Healthcare Providers: SeriousBroker.it  This test is not yet approved or cleared by the [place] FDA and has been authorized for detection and/or diagnosis of SARS-CoV-2 by FDA under an Emergency Use Authorization (EUA). This EUA will remain in effect (meaning this test can be used) for the duration of the COVID-19 declaration under Section 564(b)(1) of the Act, 21 U.S.C. section 360bbb-3(b)(1), unless the authorization is terminated or revoked.  Performed at [facility], [address] [place], [place] [address]   Surgical PCR screen     Status: None   Collection Time: 11/16/20 10:52 AM   Specimen: Nasal Mucosa; Nasal Swab  Result Value Ref Range Status   MRSA, PCR NEGATIVE NEGATIVE Final   Staphylococcus aureus NEGATIVE NEGATIVE Final    Comment: (NOTE) The Xpert SA Assay (FDA approved for NASAL specimens in patients 28 years of age and older), is one component of a comprehensive surveillance program. It is not intended to diagnose infection nor to guide or monitor treatment. Performed at [facility],  [address] [place], [place] [address]    RN Pressure Injury Documentation:     Estimated body mass index is 26.53 kg/m as calculated from the  following:   Height as of this encounter:  (1.575 m).   Weight as of this encounter: 65.8 kg.  Malnutrition Type: Nutrition Problem: Inadequate oral intake Etiology: decreased appetite Malnutrition Characteristics: Signs/Symptoms: per patient/family report Nutrition Interventions: Interventions: Boost Plus, MVI Radiology Studies: DG C-Arm 1-60 Min  Result Date: 11/18/2020 CLINICAL DATA:  ORIF distal femur fracture. EXAM: DG C-ARM 1-60 MIN; RIGHT FEMUR 2 VIEWS FLUOROSCOPY TIME:  Fluoroscopy Time:  2 minutes Radiation Exposure Index (if provided by the fluoroscopic device): 4.98 mGy. Number of Acquired Spot Images: 12 COMPARISON:  November 16, 2020. FINDINGS: Twelve C-arm fluoroscopic images were obtained intraoperatively and submitted for post operative interpretation. These images demonstrate fixation of a a mid femoral shaft fracture with plate and screws and cerclage wires. Improved alignment, near anatomic. Partially imaged right total hip and right total knee arthroplasties. Expected surrounding soft tissue swelling and gas. Please see the performing provider's procedural report for further detail. IMPRESSION: Intraoperative fluoroscopy, as detailed above. Electronically Signed   By: Feliberto Harts MD   On: 11/18/2020 15:42   DG FEMUR, MIN 2 VIEWS RIGHT  Result Date: 11/18/2020 CLINICAL DATA:  ORIF distal femur fracture. EXAM: DG C-ARM 1-60 MIN; RIGHT FEMUR 2 VIEWS FLUOROSCOPY TIME:  Fluoroscopy Time:  2 minutes Radiation Exposure Index (if provided by the fluoroscopic device): 4.98 mGy. Number of Acquired Spot Images: 12 COMPARISON:  November 16, 2020. FINDINGS: Twelve C-arm fluoroscopic images were obtained intraoperatively and submitted for post operative interpretation. These images demonstrate fixation of a a mid femoral shaft fracture with  plate and screws and cerclage wires. Improved alignment, near anatomic. Partially imaged right total hip and right total knee arthroplasties. Expected surrounding soft tissue swelling and gas. Please see the performing provider's procedural report for further detail. IMPRESSION: Intraoperative fluoroscopy, as detailed above. Electronically Signed   By: Feliberto Harts MD   On: 11/18/2020 15:42   DG FEMUR PORT, MIN 2 VIEWS RIGHT  Result Date: 11/18/2020 CLINICAL DATA:  Femur fracture EXAM: RIGHT FEMUR PORTABLE 2 VIEW COMPARISON:  11/18/2020 FINDINGS: Patient is status post right hip and knee replacements. Interval surgical plate and multiple screw fixation of the femur for mid to distal femoral shaft fracture. Mild residual displacement but overall decreased angulation and displacement compared to prior. Placement of cerclage wires about the mid femoral shaft. Slight gap between the fixating plate and cortex of femur measuring to 3 mm maximum at the level of the midshaft. Gas in the soft tissues consistent with recent surgery. IMPRESSION: Prior right hip and knee replacements with interval surgical plate and multiple screw fixation of the right femur for mid to distal femoral shaft fracture. Electronically Signed   By: Jasmine Pang M.D.   On: 11/18/2020 19:15    Scheduled Meds:  sodium chloride   Intravenous Once   sodium chloride   Intravenous Once   apixaban  5 mg Oral BID   diltiazem  120 mg Oral BID   docusate sodium  100 mg Oral BID   ferrous sulfate  325 mg Oral Q breakfast   lactose free nutrition  237 mL Oral TID WC   levothyroxine  125 mcg Oral Q0600   metoprolol succinate  25 mg Oral BID   mirtazapine  7.5 mg Oral QHS   multivitamin with minerals  1 tablet Oral Daily   senna  1 tablet Oral BID   Continuous Infusions:  methocarbamol (ROBAXIN) IV      LOS: 4 days   [employer],  DO [facility] PAGER is on AMION  If 7PM-7AM, please contact  night-coverage [email or web address]

## 2020-11-21 ENCOUNTER — Inpatient Hospital Stay (HOSPITAL_COMMUNITY): Payer: Medicare Other

## 2020-11-21 DIAGNOSIS — R410 Disorientation, unspecified: Secondary | ICD-10-CM

## 2020-11-21 DIAGNOSIS — D72829 Elevated white blood cell count, unspecified: Secondary | ICD-10-CM

## 2020-11-21 LAB — COMPREHENSIVE METABOLIC PANEL
ALT: 21 U/L (ref 0–44)
AST: 32 U/L (ref 15–41)
Albumin: 2.6 g/dL — ABNORMAL LOW (ref 3.5–5.0)
Alkaline Phosphatase: 44 U/L (ref 38–126)
Anion gap: 8 (ref 5–15)
BUN: 24 mg/dL — ABNORMAL HIGH (ref 8–23)
CO2: 27 mmol/L (ref 22–32)
Calcium: 8.9 mg/dL (ref 8.9–10.3)
Chloride: 103 mmol/L (ref 98–111)
Creatinine, Ser: 0.66 mg/dL (ref 0.44–1.00)
GFR, Estimated: >60 mL/min (ref >60)
Glucose, Bld: 118 mg/dL — ABNORMAL HIGH (ref 70–99)
Potassium: 3.8 mmol/L (ref 3.5–5.1)
Sodium: 138 mmol/L (ref 135–145)
Total Bilirubin: 1.4 mg/dL — ABNORMAL HIGH (ref 0.3–1.2)
Total Protein: 5.2 g/dL — ABNORMAL LOW (ref 6.5–8.1)

## 2020-11-21 LAB — BPAM RBC
Blood Product Expiration Date: 202207292359
Blood Product Expiration Date: 202207292359
Blood Product Expiration Date: 202208022359
Blood Product Expiration Date: 202208022359
ISSUE DATE / TIME: 202206301337
ISSUE DATE / TIME: 202207031155
Unit Type and Rh: 5100
Unit Type and Rh: 5100
Unit Type and Rh: 5100
Unit Type and Rh: 5100

## 2020-11-21 LAB — CBC WITH DIFFERENTIAL/PLATELET
Abs Immature Granulocytes: 0.1 10*3/uL — ABNORMAL HIGH (ref 0.00–0.07)
Basophils Absolute: 0 10*3/uL (ref 0.0–0.1)
Basophils Relative: 0 %
Eosinophils Absolute: 0.1 10*3/uL (ref 0.0–0.5)
Eosinophils Relative: 0 %
HCT: 25.1 % — ABNORMAL LOW (ref 36.0–46.0)
Hemoglobin: 8.3 g/dL — ABNORMAL LOW (ref 12.0–15.0)
Immature Granulocytes: 1 %
Lymphocytes Relative: 8 %
Lymphs Abs: 1.2 10*3/uL (ref 0.7–4.0)
MCH: 31 pg (ref 26.0–34.0)
MCHC: 33.1 g/dL (ref 30.0–36.0)
MCV: 93.7 fL (ref 80.0–100.0)
Monocytes Absolute: 2.1 10*3/uL — ABNORMAL HIGH (ref 0.1–1.0)
Monocytes Relative: 14 %
Neutro Abs: 11.6 10*3/uL — ABNORMAL HIGH (ref 1.7–7.7)
Neutrophils Relative %: 77 %
Platelets: 373 10*3/uL (ref 150–400)
RBC: 2.68 MIL/uL — ABNORMAL LOW (ref 3.87–5.11)
RDW: 14.4 % (ref 11.5–15.5)
WBC: 15 10*3/uL — ABNORMAL HIGH (ref 4.0–10.5)
nRBC: 0 % (ref 0.0–0.2)

## 2020-11-21 LAB — TYPE AND SCREEN
ABO/RH(D): O POS
Antibody Screen: NEGATIVE
Unit division: 0
Unit division: 0
Unit division: 0
Unit division: 0

## 2020-11-21 LAB — PHOSPHORUS: Phosphorus: 2.7 mg/dL (ref 2.5–4.6)

## 2020-11-21 LAB — MAGNESIUM: Magnesium: 1.9 mg/dL (ref 1.7–2.4)

## 2020-11-21 MED ORDER — KCL IN DEXTROSE-NACL 20-5-0.9 MEQ/L-%-% IV SOLN
INTRAVENOUS | Status: AC
  Administered 2020-11-21: 75 mL via INTRAVENOUS
  Filled 2020-11-21 (×2): qty 1000

## 2020-11-21 MED ORDER — METOPROLOL SUCCINATE ER 50 MG PO TB24
50.0000 mg | ORAL_TABLET | Freq: Two times a day (BID) | ORAL | Status: DC
  Administered 2020-11-21 – 2020-11-24 (×7): 50 mg via ORAL
  Filled 2020-11-21 (×7): qty 1

## 2020-11-21 NOTE — Progress Notes (Signed)
PROGRESS NOTE    Katrina Velez  NWG:[id number] DOB: Feb 29, 1926 DOA: 11/15/2020 PCP: System, Provider Not In   Brief Narrative:  The patient is a 87 year old elderly Caucasian female with a past medical history significant for but not limited to permanent atrial fibrillation on anticoagulation with apixaban, hypothyroidism, osteoarthritis, depression, spinal stenosis of his lumbar spine with neurogenic claudication as well as other comorbidities presented to the ED via EMS complaining of right thigh pain after mechanical fall.  She had no significant abnormalities noted on her laboratory work.  She screened negative for COVID.  X-ray did of the hip showed mild right femoral shaft fracture with overlapping angulated and displaced fragments.  Chest x-ray showed no active disease and the EDP spoke to the orthopedic surgeon who consulted and are planning for surgical intervention on Thursday.  Patient was walking from her bedroom to the kitchen to take her evening medications and slept and fell down to the floor.  She denied hitting her head or loss of consciousness.  She did take Eliquis the night that she fell.  Overnight she became extremely combative and agitated after administration of IV morphine.  She was subsequently given IV Haldol given the agitation. She underwent surgical intervention 11/18/20 and is POD3. Had RVR into the 170's yesterday evening so will go up on Metoprolol Succinate to 50 mg BID.   Patient was confused again this AM and likley has Hospital Delirium. Daughter stated she was 80% lucid yesterday but today she is totally confused. Will check a Bladder Scan to ensure patient is not retaining and obtain Head CT Scan. If remains confused may do further Neurological workup and get Further Imaging. Awaiting SNF placement for Rehabilitative Efforts and family requested Social work to [employer].   Assessment & Plan:   Principal Problem:   Femur fracture (HCC) Active Problems:    Hypothyroidism post removal of goiter   Atrial fibrillation (HCC)   Acute blood loss anemia  Right Femur Fracture s/p ORIF of Right Distal Femur Fracture and Right Femoral Shaft POD 3 -In the setting of Fall -No concern for Syncopal Episode -Orthopedic Surgery Consulted with plans for Surgical Intervention and this was done 11/18/20 by Dr. Jena Gauss  -Judicious use of Narcotics -VTE Prophylaxis with Enoxaparin 30 mg sq q24h initially but now changed back to Apixaban 5 mg po BID -Ortho Recommendind PWB RLE 50% and recommending that the Dressings -PT/OT to evaluate and Treat and recommending SNF -Pain Control with Acetaminophen 650 mg po q4hprn Mild Pain, Hydrocodone-Acetaminophen 5-325 mg po q4hprn Severe Pain and Tramadol 50 mg po q6hprn Moderate Pain, WILL NOT USE ANYMORE IV Morphine given her Agitation and combativeness -Vitamin D Level was 32.97  -C/w Robaxin 500 mg po/IV q6hprn Muscle Spasms -Was on IVF Hydration with D5 NS at 75 mL/hr but it was stopped. Will resume given poor po Intake for 16 hours.  -Bowel Regimen with Senna 8.6 mg po 1 tab BID  -C/w Supportive Care  Permanent Atrial Fibrillation now with RVR -Hold Eliquis (Takes 5 mg po BID but will likley need to reduce to 2.5 mg po BID when re-ordered) -C/w with Diltaizem 120 mg po BID; Metoprolol Succinate 25 mg po BID will be increased to 50 mg po BID  -INR was 1.6 -Continue to Monitor on Telemetry and having RVR -Give IV Metoprolol 5 mg x1 again last night; If not improving will consider Cardizem gtt  Iatrogenic Hypothyroidism (Post Removal of Goiter) -Checked TSH and was 0.263; Now will Check Free  T4 and may consider adjustments of Levothyroxine  -C/w Levothyroxine 125 mcg po Daily   Hyponatremia -Mild. Na+ went from 134 -> 132 -> 134 -> 135 -> 134 -> 138 -Will Resume IVF as above -Continue to Monitor and Trend -Repeat CMP in the AM   Acute Agitation and Confusion, waxing and Waning and Likely Hospital Delirium   -Had  significant aggression towards the staff and removed IV initially and was in the setting of Narcotics -In the setting of Narcotics, specifically IV Morphine 0.5 mg administration -Daughter states the patient had a similar episode 30 years ago after morphine -STOP Morphine -Given 2 mg of IV Haldol overnight -Delirium Precautions -She continues to be Confused and now likely has Hospital Delirium  -Check Bladder Scan, Check U/A and Urine Cx; Will obtain Head Imaging with CT Scan    CKD Stage 3a -BUN/Cr went from 21/0.95 -> 27/1.01 -> 14/0.68 -> 14/0.67 -> 16/0.62 -> 24/0.66 -IVF as above now Resumed -Avoid Further Nephrotoxic Medications, Contrast Dyes, and Hypotension and Renally adjust Medications -Repeat CMP in the AM   Normocytic Anemia -Patient was likely Hemoconcentrated on Admission -Hgb/Hct went from 13.1/40.8 -> 8.0/24.7 -> 8.9/26.4 -> 8.1/24.4 -> 8.3/25.1 and ? Dilutional Drop and post Surgical Drop; Now Hgb/Hct appears to have stabilized  -Checked Anemia Panel showed an iron level of 18, U IBC of 247, TIBC of 265, saturation ratios of 7%, ferritin level 98, folate level 9.4, vitamin B12 level 495 -We will start iron supplementation with ferrous sulfate 325 mg p.o. daily with breakfast -Continue to Monitor for S/Sx of Bleeding; No overt bleeding noted -Repeat CBC in the AM   Leukocytosis -Likely Reactive in the setting of Fall and Pain initially and now Surgery  -WBC went from 7.8 -> 11.9 -> 15.3 -> 15.8 -> 15.0 -Continue to Monitor for S/Sx of Infection and will check U/A and Urine Cx -Currently Afebrile but had a Tmax of 99.2; Currently Temp is 98.1 -Repeat CBC in the AM   Hypophosphatemia -Mild. Phos Level was 2.2 and now is 2.7 -Continue to Monitor and Replete as Necessary -Repeat Phos Level in the AM  Hyperbilirubinemia -Mild and Likely Reactive -Patient's T Bili went from 1.2 -> 1.7 -> 1.4 -Continue to Monitor and Trend -Repeat CMP in the AM   DVT prophylaxis:  Anticoagulated with Apixaban  Code Status: FULL CODE  Family Communication: Discussed with Daughter and Son at bedside  Disposition Plan: SNF when bed available and Insurance Auth is obtained  Status is: Inpatient  Remains inpatient appropriate because:Unsafe d/c plan, IV treatments appropriate due to intensity of illness or inability to take PO, and Inpatient level of care appropriate due to severity of illness  Dispo: The patient is from: Home              Anticipated d/c is to: SNF              Patient currently is not medically stable to d/c.   Difficult to place patient No  Consultants:  Orthopedic surgery  Procedures: None  Antimicrobials:  Anti-infectives (From admission, onward)    Start     Dose/Rate Route Frequency Ordered Stop   11/19/20 0100  vancomycin (VANCOREADY) IVPB 1000 mg/200 mL        1,000 mg 200 mL/hr over 60 Minutes Intravenous Every 12 hours 11/18/20 1721 11/19/20 0049   11/18/20 1248  vancomycin (VANCOCIN) powder  Status:  Discontinued          As needed 11/18/20  1249 11/18/20 1532   11/18/20 1100  vancomycin (VANCOCIN) IVPB 1000 mg/200 mL premix        1,000 mg 200 mL/hr over 60 Minutes Intravenous On call to O.R. 11/17/20 2136 11/18/20 1350        Subjective: Seen and examined at bedside and Remains significantly confused. Follow Commands but cannot answer any orientation questions. Family states she was agitated and did not sleep overnight. No CP or SOB. Patient denies any Pain. No other concerns or complaints at this time but family thinks she is worse today.   Objective: Vitals:   11/20/20 1544 11/20/20 2001 11/20/20 2153 11/21/20 0804  BP: 127/62 115/82 105/61 110/70  Pulse: 80 (!) 107 (!) 103 80  Resp: 18 16  18   Temp: 98.4 F (36.9 C) 99.2 F (37.3 C)  98.2 F (36.8 C)  TempSrc: Oral Oral  Oral  SpO2: 96% 92%  93%  Weight:      Height:        Intake/Output Summary (Last 24 hours) at 11/21/2020 01/22/2021 Last data filed at 11/21/2020  01/22/2021 Gross per 24 hour  Intake 740 ml  Output 2 ml  Net 738 ml    Filed Weights   11/16/20 1049 11/18/20 1210  Weight: 65.8 kg 65.8 kg   Examination:  Physical Exam:  Constitutional: The patient is a thin elderly Caucasian female in NAD but still is very confused this AM worse than yesterday Eyes: Lids and conjunctivae normal, sclerae anicteric  ENMT: External Ears, Nose appear normal. Extremely hard of hearing.  Neck: Appears normal, supple, no cervical masses, normal ROM, no appreciable thyromegaly; no jVD Respiratory: Diminished to auscultation bilaterally, no wheezing, rales, rhonchi or crackles. Normal respiratory effort and patient is not tachypenic. No accessory muscle use.  Cardiovascular: RRR, no murmurs / rubs / gallops. S1 and S2 auscultated. No extremity edema. 2+ pedal pulses. No carotid bruits.  Abdomen: Soft, non-tender, non-distended. Bowel sounds positive.  GU: Deferred. Musculoskeletal: No clubbing / cyanosis of digits/nails. Right Leg wrapped Skin: No rashes, lesions, ulcers on a limited skin evaluation. No induration; Warm and dry.  Neurologic: CN 2-12 grossly intact with no focal deficits. Romberg sign and cerebellar reflexes not assessed.  Psychiatric: Impaired judgment and insight. She is awake but not Alert and oriented x 3. Mildly anxious mood and appropriate affect.   Data Reviewed: I have personally reviewed following labs and imaging studies  CBC: Recent Labs  Lab 11/16/20 0005 11/18/20 0950 11/19/20 0458 11/20/20 0202 11/21/20 0222  WBC 7.8 11.9* 15.3* 15.8* 15.0*  NEUTROABS 5.6 9.2* 11.6* 12.1* 11.6*  HGB 13.1 8.0* 8.9* 8.1* 8.3*  HCT 40.8 24.7* 26.4* 24.4* 25.1*  MCV 93.6 93.2 92.3 92.4 93.7  PLT 316 281 294 323 373    Basic Metabolic Panel: Recent Labs  Lab 11/17/20 0600 11/18/20 0950 11/19/20 0458 11/20/20 0202 11/21/20 0222  NA 132* 134* 135 134* 138  K 4.3 4.2 4.2 3.7 3.8  CL 96* 101 103 102 103  CO2 29 27 26 24 27   GLUCOSE  112* 148* 151* 130* 118*  BUN 27* 14 14 16  24*  CREATININE 1.01* 0.68 0.67 0.62 0.66  CALCIUM 9.8 9.0 9.0 8.9 8.9  MG  --  1.8 1.7 2.0 1.9  PHOS  --  2.4* 2.2* 3.4 2.7    GFR: Estimated Creatinine Clearance: 39.1 mL/min (by C-G formula based on SCr of 0.66 mg/dL). Liver Function Tests: Recent Labs  Lab 11/18/20 11/19/20 0458 11/20/20 0202 11/21/20 0222  AST 27 34 32 32  ALT ALKPHOS 43 43 41 44  BILITOT 0.7 1.2 1.7* 1.4*  PROT 5.6* 5.5* 5.5* 5.2*  ALBUMIN 3.0* 2.9* 2.8* 2.6*    No results for input(s): LIPASE, AMYLASE in the last 168 hours. No results for input(s): AMMONIA in the last 168 hours. Coagulation Profile: Recent Labs  Lab 11/16/20 0005  INR 1.6*    Cardiac Enzymes: No results for input(s): CKTOTAL, CKMB, CKMBINDEX, TROPONINI in the last 168 hours. BNP (last 3 results) No results for input(s): PROBNP in the last 8760 hours. HbA1C: No results for input(s): HGBA1C in the last 72 hours. CBG: No results for input(s): GLUCAP in the last 168 hours. Lipid Profile: No results for input(s): CHOL, HDL, LDLCALC, TRIG, CHOLHDL, LDLDIRECT in the last 72 hours. Thyroid Function Tests: Recent Labs    11/20/20 0202  TSH 0.263*    Anemia Panel: Recent Labs    11/19/20 0458  VITAMINB12 495  FOLATE 9.4  FERRITIN 98  TIBC 265  IRON 18*  RETICCTPCT 3.0    Sepsis Labs: No results for input(s): PROCALCITON, LATICACIDVEN in the last 168 hours.  Recent Results (from the past 240 hour(s))  Resp Panel by RT-PCR (Flu A&B, Covid) Nasopharyngeal Swab     Status: None   Collection Time: 11/16/20  1:25 AM   Specimen: Nasopharyngeal Swab; Nasopharyngeal(NP) swabs in vial transport medium  Result Value Ref Range Status   SARS Coronavirus 2 by RT PCR NEGATIVE NEGATIVE Final    Comment: (NOTE) SARS-CoV-2 target nucleic acids are NOT DETECTED.  The SARS-CoV-2 RNA is generally detectable in upper respiratory specimens during the acute phase of  infection. The lowest concentration of SARS-CoV-2 viral copies this assay can detect is 138 copies/mL. A negative result does not preclude SARS-Cov-2 infection and should not be used as the sole basis for treatment or other patient management decisions. A negative result may occur with  improper specimen collection/handling, submission of specimen other than nasopharyngeal swab, presence of viral mutation(s) within the areas targeted by this assay, and inadequate number of viral copies(<138 copies/mL). A negative result must be combined with clinical observations, patient history, and epidemiological information. The expected result is Negative.  Fact Sheet for Patients:  BloggerCourse.com  Fact Sheet for Healthcare Providers:  SeriousBroker.it  This test is no t yet approved or cleared by the [place] FDA and  has been authorized for detection and/or diagnosis of SARS-CoV-2 by FDA under an Emergency Use Authorization (EUA). This EUA will remain  in effect (meaning this test can be used) for the duration of the COVID-19 declaration under Section 564(b)(1) of the Act, 21 U.S.C.section 360bbb-3(b)(1), unless the authorization is terminated  or revoked sooner.       Influenza A by PCR NEGATIVE NEGATIVE Final   Influenza B by PCR NEGATIVE NEGATIVE Final    Comment: (NOTE) The Xpert Xpress SARS-CoV-2/FLU/RSV plus assay is intended as an aid in the diagnosis of influenza from Nasopharyngeal swab specimens and should not be used as a sole basis for treatment. Nasal washings and aspirates are unacceptable for Xpert Xpress SARS-CoV-2/FLU/RSV testing.  Fact Sheet for Patients: BloggerCourse.com  Fact Sheet for Healthcare Providers: SeriousBroker.it  This test is not yet approved or cleared by the [place] FDA and has been authorized for detection and/or diagnosis of SARS-CoV-2  by FDA under an Emergency Use Authorization (EUA). This EUA will remain in effect (meaning this test can be used) for the duration of  the COVID-19 declaration under Section 564(b)(1) of the Act, 21 U.S.C. section 360bbb-3(b)(1), unless the authorization is terminated or revoked.  Performed at [facility], [address] [place], [place] [address]   Surgical PCR screen     Status: None   Collection Time: 11/16/20 10:52 AM   Specimen: Nasal Mucosa; Nasal Swab  Result Value Ref Range Status   MRSA, PCR NEGATIVE NEGATIVE Final   Staphylococcus aureus NEGATIVE NEGATIVE Final    Comment: (NOTE) The Xpert SA Assay (FDA approved for NASAL specimens in patients 85 years of age and older), is one component of a comprehensive surveillance program. It is not intended to diagnose infection nor to guide or monitor treatment. Performed at [facility], [address] [place], [place] [address]    RN Pressure Injury Documentation:     Estimated body mass index is 26.53 kg/m as calculated from the following:   Height as of this encounter: 5\' 2"  (1.575 m).   Weight as of this encounter: 65.8 kg.  Malnutrition Type: Nutrition Problem: Inadequate oral intake Etiology: decreased appetite Malnutrition Characteristics: Signs/Symptoms: per patient/family report Nutrition Interventions: Interventions: Boost Plus, MVI Radiology Studies: No results found.  Scheduled Meds:  sodium chloride   Intravenous Once   sodium chloride   Intravenous Once   apixaban  5 mg Oral BID   diltiazem  120 mg Oral BID   docusate sodium  100 mg Oral BID   ferrous sulfate  325 mg Oral Q breakfast   lactose free nutrition  237 mL Oral TID WC   levothyroxine  125 mcg Oral Q0600   metoprolol succinate  50 mg Oral BID   mirtazapine  7.5 mg Oral QHS   multivitamin with minerals  1 tablet Oral Daily   senna  1 tablet Oral BID   Continuous Infusions:  methocarbamol (ROBAXIN) IV      LOS: 5 days    Merlene Laughtermair Latif Kirt Chew, DO [facility] PAGER is on AMION  If 7PM-7AM, please contact night-coverage [email or web address]

## 2020-11-21 NOTE — TOC Progression Note (Signed)
Transition of Care [facility]) - Progression Note    Patient Details  Name: Katrina Velez MRN: [id number] Date of Birth: 10/30/1925  Transition of [facility]) CM/SW Contact  Levada Schilling Phone Number: 11/21/2020, 9:51 AM  Clinical Narrative:    CSW contacted pt's daughter to give an update on SNF's acceptance.  Pt's daughter was appreciative of SNF updates.  TOC will continue to assist with disposition planning.   Expected Discharge Plan: Skilled Nursing Facility Barriers to Discharge: Continued Medical Work up  Expected Discharge Plan and Services Expected Discharge Plan: Skilled Nursing Facility                                               Social Determinants of Health (SDOH) Interventions    Readmission Risk Interventions No flowsheet data found.

## 2020-11-21 NOTE — Progress Notes (Signed)
Patient very confused / disoriented this am.  Attempting to get OOB.  Incontinent x 2.  Daughter in to vs.  Very upset re:  patient's decline in cognition.  Both require much emotional support from staff.  Important to explain to patient what will be done prior to any movement.  Cognition appears to improve once around her daughter.  PO intake remains very poor.

## 2020-11-21 NOTE — Progress Notes (Signed)
Patient has been alert and oriented to self only.  As evening progressed, she became more confused, forgetful, anxious.  Staff had to come in to replace telemetry leads and clean her up from having a bowel movement, she accused staff of trying to kill her.  Staff reminded patient that she is in the hospital and we are only trying to care for her.

## 2020-11-22 LAB — COMPREHENSIVE METABOLIC PANEL
ALT: 21 U/L (ref 0–44)
AST: 28 U/L (ref 15–41)
Albumin: 2.5 g/dL — ABNORMAL LOW (ref 3.5–5.0)
Alkaline Phosphatase: 41 U/L (ref 38–126)
Anion gap: 4 — ABNORMAL LOW (ref 5–15)
BUN: 26 mg/dL — ABNORMAL HIGH (ref 8–23)
CO2: 26 mmol/L (ref 22–32)
Calcium: 8.8 mg/dL — ABNORMAL LOW (ref 8.9–10.3)
Chloride: 107 mmol/L (ref 98–111)
Creatinine, Ser: 0.72 mg/dL (ref 0.44–1.00)
GFR, Estimated: >60 mL/min (ref >60)
Glucose, Bld: 109 mg/dL — ABNORMAL HIGH (ref 70–99)
Potassium: 3.6 mmol/L (ref 3.5–5.1)
Sodium: 137 mmol/L (ref 135–145)
Total Bilirubin: 1.7 mg/dL — ABNORMAL HIGH (ref 0.3–1.2)
Total Protein: 4.9 g/dL — ABNORMAL LOW (ref 6.5–8.1)

## 2020-11-22 LAB — URINALYSIS, ROUTINE W REFLEX MICROSCOPIC
Bilirubin Urine: NEGATIVE
Glucose, UA: NEGATIVE mg/dL
Hgb urine dipstick: NEGATIVE
Ketones, ur: NEGATIVE mg/dL
Leukocytes,Ua: NEGATIVE
Nitrite: NEGATIVE
Protein, ur: NEGATIVE mg/dL
Specific Gravity, Urine: 1.02 (ref 1.005–1.030)
pH: 5 (ref 5.0–8.0)

## 2020-11-22 LAB — CBC WITH DIFFERENTIAL/PLATELET
Abs Immature Granulocytes: 0.09 10*3/uL — ABNORMAL HIGH (ref 0.00–0.07)
Basophils Absolute: 0 10*3/uL (ref 0.0–0.1)
Basophils Relative: 0 %
Eosinophils Absolute: 0.1 10*3/uL (ref 0.0–0.5)
Eosinophils Relative: 1 %
HCT: 24.6 % — ABNORMAL LOW (ref 36.0–46.0)
Hemoglobin: 7.9 g/dL — ABNORMAL LOW (ref 12.0–15.0)
Immature Granulocytes: 1 %
Lymphocytes Relative: 9 %
Lymphs Abs: 1.3 10*3/uL (ref 0.7–4.0)
MCH: 30.5 pg (ref 26.0–34.0)
MCHC: 32.1 g/dL (ref 30.0–36.0)
MCV: 95 fL (ref 80.0–100.0)
Monocytes Absolute: 1.6 10*3/uL — ABNORMAL HIGH (ref 0.1–1.0)
Monocytes Relative: 12 %
Neutro Abs: 11.2 10*3/uL — ABNORMAL HIGH (ref 1.7–7.7)
Neutrophils Relative %: 77 %
Platelets: 432 10*3/uL — ABNORMAL HIGH (ref 150–400)
RBC: 2.59 MIL/uL — ABNORMAL LOW (ref 3.87–5.11)
RDW: 14.5 % (ref 11.5–15.5)
WBC: 14.3 10*3/uL — ABNORMAL HIGH (ref 4.0–10.5)
nRBC: 0 % (ref 0.0–0.2)

## 2020-11-22 LAB — MAGNESIUM: Magnesium: 1.9 mg/dL (ref 1.7–2.4)

## 2020-11-22 LAB — T4, FREE: Free T4: 1.2 ng/dL — ABNORMAL HIGH (ref 0.61–1.12)

## 2020-11-22 LAB — PHOSPHORUS: Phosphorus: 2.7 mg/dL (ref 2.5–4.6)

## 2020-11-22 MED ORDER — KCL IN DEXTROSE-NACL 20-5-0.9 MEQ/L-%-% IV SOLN
INTRAVENOUS | Status: AC
  Filled 2020-11-22: qty 1000

## 2020-11-22 MED ORDER — LEVOTHYROXINE SODIUM 100 MCG PO TABS
100.0000 ug | ORAL_TABLET | Freq: Every day | ORAL | Status: DC
  Administered 2020-11-23 – 2020-11-24 (×2): 100 ug via ORAL
  Filled 2020-11-22 (×2): qty 1

## 2020-11-22 NOTE — Progress Notes (Signed)
Occupational Therapy Treatment Patient Details Name: Katrina Velez MRN: [id number] DOB: 12-31-1925 Today's Date: 11/22/2020    History of present illness Pt is a 87 year old woman admitted after mechanical fall at home on 11/15/20 resulting in R distal femur fx. Underwent ORIF on 11/18/20. Hospital course complicated by combativeness from morphine. PMH: afib on anticoagulation, hypothyroidism, OA, depression, spinal stenosis, CKD, COPD, COPD, B shoulder replacements, B knee replacements.   OT comments  Pt received supine in bed eager to mobilize OOB. Pt continues to present with increased pain, impaired activity tolerance and generalized deconditioning. Pt currently requires MODA  for bed bed mobility, and MAX A for stand pivot transfers x2. Pt with decreased ability to pivot feet needing MAX A, may benefit from stedy next session. Pt currently requires MAX A for LB ADLS. Pt would continue to benefit from skilled occupational therapy while admitted and after d/c to address the below listed limitations in order to improve overall functional mobility and facilitate independence with BADL participation. DC plan remains appropriate, will follow acutely per POC.    Follow Up Recommendations  SNF;Supervision/Assistance - 24 hour    Equipment Recommendations  Other (comment) (defer to next venue of care)    Recommendations for Other Services      Precautions / Restrictions Precautions Precautions: Fall Precaution Comments: HOH Restrictions Weight Bearing Restrictions: Yes RLE Weight Bearing: Weight bearing as tolerated       Mobility Bed Mobility Overal bed mobility: Needs Assistance Bed Mobility: Supine to Sit     Supine to sit: Mod assist;HOB elevated     General bed mobility comments: pt able to initaite manevuer BLEs to EOB but needing MAX cues for sequencing, MOD A to fully maneuver RLE to EOB, use of bed pad to fully scoot hips to EOB    Transfers Overall transfer level: Needs  assistance Equipment used: Rolling walker (2 wheeled);1 person hand held assist (face to face assist) Transfers: Sit to/from [employer] Transfers Sit to Stand: Max assist;From elevated surface Stand pivot transfers: Max assist;From elevated surface       General transfer comment: pt able to stand to RW from elevated EOB with MAX A +1 but unable to pivot feet, pt required MAX A +1 for stand pivot transfer from EOB>BSC>recliner with pt needing assist with pivotal steps between surfaces    Balance Overall balance assessment: Needs assistance Sitting-balance support: Bilateral upper extremity supported;Feet supported Sitting balance-Leahy Scale: Poor Sitting balance - Comments: pt required BUE support for static sitting and close supervisio   Standing balance support: Bilateral upper extremity supported Standing balance-Leahy Scale: Poor Standing balance comment: reliant on external assist and BUE support                           ADL either performed or assessed with clinical judgement   ADL Overall ADL's : Needs assistance/impaired                     Lower Body Dressing: Maximal assistance;Sitting/lateral leans Lower Body Dressing Details (indicate cue type and reason): to don socks Toilet Transfer: Maximal assistance;Stand-pivot;BSC Toilet Transfer Details (indicate cue type and reason): MAX A to pivot from EOB >BSC>recliner, pt unable to pivot feet effectively         Functional mobility during ADLs: Maximal assistance (stand pivot only) General ADL Comments: pt continues to present with generalized weakness, imparied activity tolerance and decreased strength and endurance  Vision       Perception     Praxis      Cognition Arousal/Alertness: Awake/alert Behavior During Therapy: Restless;WFL for tasks assessed/performed (eager to get OOB but appropriate) Overall Cognitive Status: Difficult to assess                                  General Comments: overall WFL, increased cues to sequence mobility tasks but likely d/t [facility]        Exercises General Exercises - Lower Extremity Ankle Circles/Pumps: 10 reps;AROM;Seated;Other (comment);Right (long sitting in recliner) Gluteal Sets: AROM;Right;10 reps;Seated;Other (comment) (long sitting in reclner) Hip ABduction/ADduction: AROM;Right;10 reps;Seated;Other (comment) (long sitting in bed)   Shoulder Instructions       General Comments HR increased to 162 bpm with activity, family present and supportive really wanting to OOB.    Pertinent Vitals/ Pain       Pain Assessment: Faces Faces Pain Scale: Hurts little more Pain Location: R hip Pain Descriptors / Indicators: Grimacing;Guarding;Discomfort Pain Intervention(s): Limited activity within patient's tolerance;Monitored during session;Repositioned  Home Living                                          Prior Functioning/Environment              Frequency  Min 2X/week        Progress Toward Goals  OT Goals(current goals can now be found in the care plan section)  Progress towards OT goals: Progressing toward goals  Acute Rehab OT Goals Patient Stated Goal: to get to chair OT Goal Formulation: With patient/family Time For Goal Achievement: 12/03/20 Potential to Achieve Goals: Fair  Plan Discharge plan remains appropriate;Frequency remains appropriate    Co-evaluation                 AM-PAC OT "6 Clicks" Daily Activity     Outcome Measure   Help from another person eating meals?: None Help from another person taking care of personal grooming?: A Little Help from another person toileting, which includes using toliet, bedpan, or urinal?: Total Help from another person bathing (including washing, rinsing, drying)?: A Lot Help from another person to put on and taking off regular upper body clothing?: A Little Help from another person to put on and taking off regular  lower body clothing?: Total 6 Click Score: 14    End of Session Equipment Utilized During Treatment: Gait belt;Rolling walker  OT Visit Diagnosis: Unsteadiness on feet (R26.81);Other abnormalities of gait and mobility (R26.89);Pain;Muscle weakness (generalized) (M62.81) Pain - Right/Left: Right Pain - part of body: Hip;Leg   Activity Tolerance Patient tolerated treatment well   Patient Left in chair;with call bell/phone within reach;with family/visitor present   Nurse Communication Mobility status;Other (comment) (+2 back to bed with stedy; allow pt to sit up for 1 hour before returning to bed)        Time: 6384-6659 OT Time Calculation (min): 26 min  Charges: OT General Charges $OT Visit: 1 Visit OT Treatments $Self Care/Home Management : 23-37 mins  Lenor Derrick., COTA/L Acute Rehabilitation Services [phone]    Barron Schmid 11/22/2020, 11:21 AM

## 2020-11-22 NOTE — Progress Notes (Signed)
PROGRESS NOTE    Katrina Velez  YWV:[id number] DOB: 1926/04/25 DOA: 11/15/2020 PCP: System, Provider Not In   Brief Narrative:  The patient is a 87 year old elderly Caucasian female with a past medical history significant for but not limited to permanent atrial fibrillation on anticoagulation with apixaban, hypothyroidism, osteoarthritis, depression, spinal stenosis of his lumbar spine with neurogenic claudication as well as other comorbidities presented to the ED via EMS complaining of right thigh pain after mechanical fall.  She had no significant abnormalities noted on her laboratory work.  She screened negative for COVID.  X-ray did of the hip showed mild right femoral shaft fracture with overlapping angulated and displaced fragments.  Chest x-ray showed no active disease and the EDP spoke to the orthopedic surgeon who consulted and are planning for surgical intervention on Thursday.  Patient was walking from her bedroom to the kitchen to take her evening medications and slept and fell down to the floor.  She denied hitting her head or loss of consciousness.  She did take Eliquis the night that she fell.  Overnight she became extremely combative and agitated after administration of IV morphine.  She was subsequently given IV Haldol given the agitation. She underwent surgical intervention 11/18/20 and is POD3. Had RVR into the 170's yesterday evening so will go up on Metoprolol Succinate to 50 mg BID.   Patient was confused again this AM and likley has Hospital Delirium. Daughter stated she was 80% lucid yesterday but today she is totally confused. Will check a Bladder Scan to ensure patient is not retaining and obtain Head CT Scan. If remains confused may do further Neurological workup and get Further Imaging. Awaiting SNF placement for Rehabilitative Efforts and family requested Social work to [employer].   Assessment & Plan:   Principal Problem:   Femur fracture (HCC) Active Problems:    Hypothyroidism post removal of goiter   Atrial fibrillation (HCC)   Acute blood loss anemia  Right Femur Fracture s/p ORIF of Right Distal Femur Fracture and Right Femoral Shaft POD 4 -In the setting of Fall -No concern for Syncopal Episode -Orthopedic Surgery Consulted with plans for Surgical Intervention and this was done 11/18/20 by Dr. Jena Gauss  -Judicious use of Narcotics -VTE Prophylaxis with Enoxaparin 30 mg sq q24h initially but now changed back to Apixaban 5 mg po BID -Ortho Recommendind PWB RLE 50% and recommending that the Dressings -PT/OT to evaluate and Treat and recommending SNF -Pain Control with Acetaminophen 650 mg po q4hprn Mild Pain, Hydrocodone-Acetaminophen 5-325 mg po q4hprn Severe Pain and Tramadol 50 mg po q6hprn Moderate Pain, WILL NOT USE ANYMORE IV Morphine given her Agitation and combativeness -Vitamin D Level was 32.97  -C/w Robaxin 500 mg po/IV q6hprn Muscle Spasms -Was on IVF Hydration with D5 NS at 75 mL/hr but it was stopped. Will resume again given poor po Intake for 16 hours.  -Bowel Regimen with Senna 8.6 mg po 1 tab BID  -C/w Supportive Care  Permanent Atrial Fibrillation now with RVR -Hold Eliquis (Takes 5 mg po BID but will likley need to reduce to 2.5 mg po BID when re-ordered) -C/w with Diltaizem 120 mg po BID; Metoprolol Succinate 25 mg po BID will be increased to 50 mg po BID  -INR was 1.6 -Continue to Monitor on Telemetry and having RVR -Give IV Metoprolol 5 mg x1 again last night; If not improving will consider Cardizem gtt -Continues to be Tachycardic but asymptomatic with HR's ranging from 100's-120's  Iatrogenic Hypothyroidism (Post  Removal of Goiter) -Checked TSH and was 0.263; Now will Check Free T4 and was elevated at 1.20 so will make adjustments of Levothyroxine  -Will Decrease Levothyroxine 125 mcg po Daily to 100 mcg po Daily   Hyponatremia -Mild. Na+ went from 134 -> 132 -> 134 -> 135 -> 134 -> 138 -Will Resume IVF as  above -Continue to Monitor and Trend -Repeat CMP in the AM   Acute Agitation and Confusion, Waxing and Waning and Likely Hospital Delirium   -Had significant aggression towards the staff and removed IV initially and was in the setting of Narcotics -In the setting of Narcotics, specifically IV Morphine 0.5 mg administration -Daughter states the patient had a similar episode 30 years ago after morphine -STOP Morphine -Given 2 mg of IV Haldol overnight -Delirium Precautions -She continues to be Confused and now likely has Hospital Delirium  -Check Bladder Scan, Check U/A and Urine Cx; Will obtain Head Imaging with CT Scan   -Head CT Scan done and showed "No acute intracranial abnormality. Stable non contrast CT appearance of the brain since 2020." -Bladder Scan, Urinalysis and Urine Cx still pending to be done  CKD Stage 3a -BUN/Cr went from 21/0.95 -> 27/1.01 -> 14/0.68 -> 14/0.67 -> 16/0.62 -> 24/0.66 -> 26/0.72 -IVF as above now Resumed for 16 hours -Avoid Further Nephrotoxic Medications, Contrast Dyes, and Hypotension and Renally adjust Medications -Repeat CMP in the AM   Normocytic Anemia -Patient was likely Hemoconcentrated on Admission -Hgb/Hct went from 13.1/40.8 -> 8.0/24.7 -> 8.9/26.4 -> 8.1/24.4 -> 8.3/25.1 -> 7.9/24.6 and ? Dilutional Drop and post Surgical Drop; Now Hgb/Hct appears to have stabilized  -Checked Anemia Panel showed an iron level of 18, U IBC of 247, TIBC of 265, saturation ratios of 7%, ferritin level 98, folate level 9.4, vitamin B12 level 495 -We will start iron supplementation with ferrous sulfate 325 mg p.o. daily with breakfast -Continue to Monitor for S/Sx of Bleeding; No overt bleeding noted -Repeat CBC in the AM   Leukocytosis, improving  -Likely Reactive in the setting of Fall and Pain initially and now Surgery  -WBC went from 7.8 -> 11.9 -> 15.3 -> 15.8 -> 15.0 -> 14.3 -Continue to Monitor for S/Sx of Infection and will check U/A and Urine Cx which  is still pending  -Currently Afebrile but had a Tmax of 99.2; Currently Temp is 98.1 -Repeat CBC in the AM   Thrombocytosis -Patient's Platelet Count went up and is likely Reactive -Platelet Count went from 294 -> 323 -> 373 -> 432 -Continue to Monitor and Trend and Repeat CBC in the AM   Hypophosphatemia -Mild. Phos Level was 2.2 and now is 2.7 again  -Continue to Monitor and Replete as Necessary -Repeat Phos Level in the AM  Hyperbilirubinemia -Mild and Likely Reactive -Patient's T Bili went from 1.2 -> 1.7 -> 1.4 -> 1.7  -Continue to Monitor and Trend -Repeat CMP in the AM   DVT prophylaxis: Anticoagulated with Apixaban  Code Status: FULL CODE  Family Communication: Discussed with Son and Daughter at bedside  Disposition Plan: SNF when bed available and Insurance Auth is obtained  Status is: Inpatient  Remains inpatient appropriate because:Unsafe d/c plan, IV treatments appropriate due to intensity of illness or inability to take PO, and Inpatient level of care appropriate due to severity of illness  Dispo: The patient is from: Home              Anticipated d/c is to: SNF  Patient currently is not medically stable to d/c.   Difficult to place patient No  Consultants:  Orthopedic surgery  Procedures: None  Antimicrobials:  Anti-infectives (From admission, onward)    Start     Dose/Rate Route Frequency Ordered Stop   11/19/20 0100  vancomycin (VANCOREADY) IVPB 1000 mg/200 mL        1,000 mg 200 mL/hr over 60 Minutes Intravenous Every 12 hours 11/18/20 1721 11/19/20 0049   11/18/20 1248  vancomycin (VANCOCIN) powder  Status:  Discontinued          As needed 11/18/20 1249 11/18/20 1532   11/18/20 1100  vancomycin (VANCOCIN) IVPB 1000 mg/200 mL premix        1,000 mg 200 mL/hr over 60 Minutes Intravenous On call to O.R. 11/17/20 2136 11/18/20 1350        Subjective: Seen and examined at bedside and was more awake and alert today. Nursing states  overnight she started sundowning and did not sleep until 5 AM and ripped out her IV. She is awake and joking today. No complaints of pain. No nausea or vomiting. No other concerns or complaints at this time.   Objective: Vitals:   11/21/20 1648 11/21/20 1859 11/21/20 2008 11/22/20 0741  BP: 107/64 115/77 109/68 (!) 96/54  Pulse: 97 98 (!) 111 70  Resp: 19 16 18 17   Temp: 97.8 F (36.6 C) 97.7 F (36.5 C) 97.6 F (36.4 C) 97.7 F (36.5 C)  TempSrc: Oral Oral Oral Oral  SpO2: 93% 97% 97% 94%  Weight:      Height:        Intake/Output Summary (Last 24 hours) at 11/22/2020 0855 Last data filed at 11/21/2020 1800 Gross per 24 hour  Intake 478.28 ml  Output 150 ml  Net 328.28 ml    Filed Weights   11/16/20 1049 11/18/20 1210  Weight: 65.8 kg 65.8 kg   Examination: Physical Exam:  Constitutional: The patient is a thin elderly Caucasian female in NAD who is confused but not as much as yesterday and she is awake and joking  Eyes: Lids and conjunctivae normal, sclerae anicteric  ENMT: External Ears, Nose appear normal. Hard of hearing Neck: Appears normal, supple, no cervical masses, normal ROM, no appreciable thyromegaly; no JVD Respiratory: Diminished to auscultation bilaterally with coarse breath sounds, no wheezing, rales, rhonchi or crackles. Normal respiratory effort and patient is not tachypenic. No accessory muscle use. Not wearing Supplemental O2 via [place]  Cardiovascular: RRR, no murmurs / rubs / gallops. No extremity edema and Right Leg is wrapped Abdomen: Soft, non-tender, non-distended. Bowel sounds positive.  GU: Deferred. Musculoskeletal: No clubbing / cyanosis of digits/nails. No joint deformity upper and lower extremities.  Skin: No rashes, lesions, ulcers on a limited skin evaluation. No induration; Warm and dry.  Neurologic: CN 2-12 grossly intact with no focal deficits. Romberg sign cerebellar reflexes not assessed.  Psychiatric: Impaired judgment and insight. Alert  and oriented x 2. Normal mood and appropriate affect.   Data Reviewed: I have personally reviewed following labs and imaging studies  CBC: Recent Labs  Lab 11/18/20 0950 11/19/20 0458 11/20/20 0202 11/21/20 0222 11/22/20 0145  WBC 11.9* 15.3* 15.8* 15.0* 14.3*  NEUTROABS 9.2* 11.6* 12.1* 11.6* 11.2*  HGB 8.0* 8.9* 8.1* 8.3* 7.9*  HCT 24.7* 26.4* 24.4* 25.1* 24.6*  MCV 93.2 92.3 92.4 93.7 95.0  PLT 281 294 323 373 432*    Basic Metabolic Panel: Recent Labs  Lab 11/18/20 0950 11/19/20 0458 11/20/20 0202 11/21/20  0222 11/22/20 0145  NA 134* 135 134* 138 137  K 4.2 4.2 3.7 3.8 3.6  CL 101 103 102 103 107  CO2 27 26 24 27 26   GLUCOSE 148* 151* 130* 118* 109*  BUN 14 14 16  24* 26*  CREATININE 0.68 0.67 0.62 0.66 0.72  CALCIUM 9.0 9.0 8.9 8.9 8.8*  MG 1.8 1.7 2.0 1.9 1.9  PHOS 2.4* 2.2* 3.4 2.7 2.7    GFR: Estimated Creatinine Clearance: 39.1 mL/min (by C-G formula based on SCr of 0.72 mg/dL). Liver Function Tests: Recent Labs  Lab 11/18/20 0950 11/19/20 0458 11/20/20 0202 11/21/20 0222 11/22/20 0145  AST 27 34 32 32 28  ALT 12 14 18 21 21   ALKPHOS 43 43 41 44 41  BILITOT 0.7 1.2 1.7* 1.4* 1.7*  PROT 5.6* 5.5* 5.5* 5.2* 4.9*  ALBUMIN 3.0* 2.9* 2.8* 2.6* 2.5*    No results for input(s): LIPASE, AMYLASE in the last 168 hours. No results for input(s): AMMONIA in the last 168 hours. Coagulation Profile: Recent Labs  Lab 11/16/20 0005  INR 1.6*    Cardiac Enzymes: No results for input(s): CKTOTAL, CKMB, CKMBINDEX, TROPONINI in the last 168 hours. BNP (last 3 results) No results for input(s): PROBNP in the last 8760 hours. HbA1C: No results for input(s): HGBA1C in the last 72 hours. CBG: No results for input(s): GLUCAP in the last 168 hours. Lipid Profile: No results for input(s): CHOL, HDL, LDLCALC, TRIG, CHOLHDL, LDLDIRECT in the last 72 hours. Thyroid Function Tests: Recent Labs    11/20/20 0202 11/22/20 0145  TSH 0.263*  --   FREET4  --  1.20*     Anemia Panel: No results for input(s): VITAMINB12, FOLATE, FERRITIN, TIBC, IRON, RETICCTPCT in the last 72 hours.  Sepsis Labs: No results for input(s): PROCALCITON, LATICACIDVEN in the last 168 hours.  Recent Results (from the past 240 hour(s))  Resp Panel by RT-PCR (Flu A&B, Covid) Nasopharyngeal Swab     Status: None   Collection Time: 11/16/20  1:25 AM   Specimen: Nasopharyngeal Swab; Nasopharyngeal(NP) swabs in vial transport medium  Result Value Ref Range Status   SARS Coronavirus 2 by RT PCR NEGATIVE NEGATIVE Final    Comment: (NOTE) SARS-CoV-2 target nucleic acids are NOT DETECTED.  The SARS-CoV-2 RNA is generally detectable in upper respiratory specimens during the acute phase of infection. The lowest concentration of SARS-CoV-2 viral copies this assay can detect is 138 copies/mL. A negative result does not preclude SARS-Cov-2 infection and should not be used as the sole basis for treatment or other patient management decisions. A negative result may occur with  improper specimen collection/handling, submission of specimen other than nasopharyngeal swab, presence of viral mutation(s) within the areas targeted by this assay, and inadequate number of viral copies(<138 copies/mL). A negative result must be combined with clinical observations, patient history, and epidemiological information. The expected result is Negative.  Fact Sheet for Patients:  01/21/21  Fact Sheet for Healthcare Providers:  01/23/21  This test is no t yet approved or cleared by the 11/18/20 FDA and  has been authorized for detection and/or diagnosis of SARS-CoV-2 by FDA under an Emergency Use Authorization (EUA). This EUA will remain  in effect (meaning this test can be used) for the duration of the COVID-19 declaration under Section 564(b)(1) of the Act, 21 U.S.C.section 360bbb-3(b)(1), unless the authorization is  terminated  or revoked sooner.       Influenza A by PCR NEGATIVE NEGATIVE Final   Influenza  B by PCR NEGATIVE NEGATIVE Final    Comment: (NOTE) The Xpert Xpress SARS-CoV-2/FLU/RSV plus assay is intended as an aid in the diagnosis of influenza from Nasopharyngeal swab specimens and should not be used as a sole basis for treatment. Nasal washings and aspirates are unacceptable for Xpert Xpress SARS-CoV-2/FLU/RSV testing.  Fact Sheet for Patients: BloggerCourse.com  Fact Sheet for Healthcare Providers: SeriousBroker.it  This test is not yet approved or cleared by the [place] FDA and has been authorized for detection and/or diagnosis of SARS-CoV-2 by FDA under an Emergency Use Authorization (EUA). This EUA will remain in effect (meaning this test can be used) for the duration of the COVID-19 declaration under Section 564(b)(1) of the Act, 21 U.S.C. section 360bbb-3(b)(1), unless the authorization is terminated or revoked.  Performed at [facility], [address] [place], [place] [address]   Surgical PCR screen     Status: None   Collection Time: 11/16/20 10:52 AM   Specimen: Nasal Mucosa; Nasal Swab  Result Value Ref Range Status   MRSA, PCR NEGATIVE NEGATIVE Final   Staphylococcus aureus NEGATIVE NEGATIVE Final    Comment: (NOTE) The Xpert SA Assay (FDA approved for NASAL specimens in patients 39 years of age and older), is one component of a comprehensive surveillance program. It is not intended to diagnose infection nor to guide or monitor treatment. Performed at [facility], [address], [place], [place] [address]    RN Pressure Injury Documentation:     Estimated body mass index is 26.53 kg/m as calculated from the following:   Height as of this encounter:  (1.575 m).   Weight as of this encounter: 65.8 kg.  Malnutrition Type: Nutrition Problem: Inadequate oral intake Etiology:  decreased appetite Malnutrition Characteristics: Signs/Symptoms: per patient/family report Nutrition Interventions: Interventions: Boost Plus, MVI Radiology Studies: CT HEAD WO CONTRAST  Result Date: 11/21/2020 CLINICAL DATA:  87 year old female with unexplained altered mental status. EXAM: CT HEAD WITHOUT CONTRAST TECHNIQUE: Contiguous axial images were obtained from the base of the skull through the vertex without intravenous contrast. COMPARISON:  Head CT 12/22/2018. FINDINGS: Brain: Cerebral volume is not significantly changed since 2020. No midline shift, ventriculomegaly, mass effect, evidence of mass lesion, intracranial hemorrhage or evidence of cortically based acute infarction. No cortical encephalomalacia identified. Patchy and confluent bilateral white matter hypodensity appears stable. Vascular: Calcified atherosclerosis at the skull base. No suspicious intracranial vascular hyperdensity. Skull: No acute osseous abnormality identified. Hyperostosis, normal variant. Sinuses/Orbits: Visualized paranasal sinuses and mastoids are stable and well aerated. Other: No acute orbit or scalp soft tissue finding. IMPRESSION: No acute intracranial abnormality. Stable non contrast CT appearance of the brain since 2020. Electronically Signed   By: Odessa Fleming M.D.   On: 11/21/2020 11:44    Scheduled Meds:  sodium chloride   Intravenous Once   sodium chloride   Intravenous Once   apixaban  5 mg Oral BID   diltiazem  120 mg Oral BID   docusate sodium  100 mg Oral BID   ferrous sulfate  325 mg Oral Q breakfast   lactose free nutrition  237 mL Oral TID WC   levothyroxine  100 mcg Oral Q0600   metoprolol succinate  50 mg Oral BID   mirtazapine  7.5 mg Oral QHS   multivitamin with minerals  1 tablet Oral Daily   senna  1 tablet Oral BID   Continuous Infusions:  dextrose 5 % and 0.9 % NaCl with KCl 20  mEq/L     methocarbamol (ROBAXIN) IV      LOS: 6 days   Merlene Laughtermair Latif Randel Hargens, DO [facility] PAGER is on AMION  If 7PM-7AM, please contact night-coverage [email or web address]

## 2020-11-22 NOTE — Progress Notes (Signed)
PT Cancellation Note  Patient Details Name: Katrina Velez MRN: [id number] DOB: August 08, 1925   Cancelled Treatment:    Reason Eval/Treat Not Completed: Other (comment). Pt with other staff at bedside for care. Will follow up as time allow vs another date. Acute PT to continue during pt's hospital stay.  Sallyanne Kuster, PTA, CLT Acute Rehab Services [phone] 11/22/20, 9:55 AM    Sallyanne Kuster 11/22/2020, 9:54 AM

## 2020-11-23 LAB — CBC WITH DIFFERENTIAL/PLATELET
Abs Immature Granulocytes: 0.24 10*3/uL — ABNORMAL HIGH (ref 0.00–0.07)
Basophils Absolute: 0 10*3/uL (ref 0.0–0.1)
Basophils Relative: 0 %
Eosinophils Absolute: 0.2 10*3/uL (ref 0.0–0.5)
Eosinophils Relative: 1 %
HCT: 24.8 % — ABNORMAL LOW (ref 36.0–46.0)
Hemoglobin: 7.8 g/dL — ABNORMAL LOW (ref 12.0–15.0)
Immature Granulocytes: 1 %
Lymphocytes Relative: 7 %
Lymphs Abs: 1.1 10*3/uL (ref 0.7–4.0)
MCH: 30.1 pg (ref 26.0–34.0)
MCHC: 31.5 g/dL (ref 30.0–36.0)
MCV: 95.8 fL (ref 80.0–100.0)
Monocytes Absolute: 1.6 10*3/uL — ABNORMAL HIGH (ref 0.1–1.0)
Monocytes Relative: 10 %
Neutro Abs: 13.6 10*3/uL — ABNORMAL HIGH (ref 1.7–7.7)
Neutrophils Relative %: 81 %
Platelets: 482 10*3/uL — ABNORMAL HIGH (ref 150–400)
RBC: 2.59 MIL/uL — ABNORMAL LOW (ref 3.87–5.11)
RDW: 14.5 % (ref 11.5–15.5)
WBC: 16.8 10*3/uL — ABNORMAL HIGH (ref 4.0–10.5)
nRBC: 0.2 % (ref 0.0–0.2)

## 2020-11-23 LAB — CBC
HCT: 28 % — ABNORMAL LOW (ref 36.0–46.0)
Hemoglobin: 8.9 g/dL — ABNORMAL LOW (ref 12.0–15.0)
MCH: 30.7 pg (ref 26.0–34.0)
MCHC: 31.8 g/dL (ref 30.0–36.0)
MCV: 96.6 fL (ref 80.0–100.0)
Platelets: 586 10*3/uL — ABNORMAL HIGH (ref 150–400)
RBC: 2.9 MIL/uL — ABNORMAL LOW (ref 3.87–5.11)
RDW: 14.7 % (ref 11.5–15.5)
WBC: 19.1 10*3/uL — ABNORMAL HIGH (ref 4.0–10.5)
nRBC: 0.3 % — ABNORMAL HIGH (ref 0.0–0.2)

## 2020-11-23 LAB — COMPREHENSIVE METABOLIC PANEL
ALT: 23 U/L (ref 0–44)
AST: 24 U/L (ref 15–41)
Albumin: 2.5 g/dL — ABNORMAL LOW (ref 3.5–5.0)
Alkaline Phosphatase: 41 U/L (ref 38–126)
Anion gap: 4 — ABNORMAL LOW (ref 5–15)
BUN: 27 mg/dL — ABNORMAL HIGH (ref 8–23)
CO2: 26 mmol/L (ref 22–32)
Calcium: 8.6 mg/dL — ABNORMAL LOW (ref 8.9–10.3)
Chloride: 106 mmol/L (ref 98–111)
Creatinine, Ser: 0.76 mg/dL (ref 0.44–1.00)
GFR, Estimated: >60 mL/min (ref >60)
Glucose, Bld: 115 mg/dL — ABNORMAL HIGH (ref 70–99)
Potassium: 4.2 mmol/L (ref 3.5–5.1)
Sodium: 136 mmol/L (ref 135–145)
Total Bilirubin: 1.4 mg/dL — ABNORMAL HIGH (ref 0.3–1.2)
Total Protein: 4.9 g/dL — ABNORMAL LOW (ref 6.5–8.1)

## 2020-11-23 LAB — URINE CULTURE: Culture: <10000 — AB

## 2020-11-23 LAB — PHOSPHORUS: Phosphorus: 2.7 mg/dL (ref 2.5–4.6)

## 2020-11-23 LAB — RESP PANEL BY RT-PCR (FLU A&B, COVID) ARPGX2
Influenza A by PCR: NEGATIVE
Influenza B by PCR: NEGATIVE
SARS Coronavirus 2 by RT PCR: NEGATIVE

## 2020-11-23 LAB — MAGNESIUM: Magnesium: 1.9 mg/dL (ref 1.7–2.4)

## 2020-11-23 MED ORDER — PROSOURCE PLUS PO LIQD
30.0000 mL | Freq: Two times a day (BID) | ORAL | Status: DC
  Administered 2020-11-24: 30 mL via ORAL
  Filled 2020-11-23: qty 30

## 2020-11-23 MED ORDER — BOOST / RESOURCE BREEZE PO LIQD CUSTOM
1.0000 | Freq: Three times a day (TID) | ORAL | Status: DC
  Administered 2020-11-23 – 2020-11-24 (×2): 1 via ORAL

## 2020-11-23 NOTE — Progress Notes (Signed)
Nutrition Follow-up  DOCUMENTATION CODES:   Not applicable  INTERVENTION:   -D/c Boost Plus -Boost Breeze po TID, each supplement provides 250 kcal and 9 grams of protein  -30 ml Prosource Plus BID, each supplement provides 100 kcals and 15 grams protein -Continue MVI with minerals daily -Magic cup TID with meals, each supplement provides 290 kcal and 9 grams of protein   NUTRITION DIAGNOSIS:   Inadequate oral intake related to decreased appetite as evidenced by per patient/family report.  Ongoing  GOAL:   Patient will meet greater than or equal to 90% of their needs  Progressing   MONITOR:   PO intake, Supplement acceptance, Labs, Weight trends, Skin, I & O's  REASON FOR ASSESSMENT:   Consult Assessment of nutrition requirement/status, Poor PO, Hip fracture protocol  ASSESSMENT:   Katrina Velez is a 87 y.o. female with medical history significant of permanent A. fib on Eliquis, hypothyroidism, osteoarthritis, depression, spinal stenosis of lumbar region with neurogenic claudication presented to the ED via EMS complaining of right thigh pain after a mechanical fall.  No significant lab abnormalities.  Screening COVID test negative.  X-ray showing mild right femoral shaft fracture with overlapping angulated and displaced fragments.  Chest x-ray showing no active disease. ED PA spoke to on-call provider for [facility] orthopedics, will consult in a.m.  6/30- s/p Procedures: CPT 27511-Open reduction internal fixation of right distal femur fracture CPT 27507-Open reduction internal fixation of right femoral shaft  Reviewed I/O's: +139 ml x 24 hours and +4.5 L since admission  UOP: 1 L x 24 hours  Pt working with therapies at time of visit.   Pt with very poor oral intake. Noted meal completion 0-10%. Pt I refusing Boost Plus supplements.   Per TOC notes, plan to d/c to SNF once insurance authorization has been accepted.   Medications reviewed and include cardizem,  colace, and senna.   Labs reviewed.   Diet Order:   Diet Order             Diet regular Room service appropriate? Yes with Assist; Fluid consistency: Thin  Diet effective now                   EDUCATION NEEDS:   Education needs have been addressed  Skin:  Skin Assessment: Skin Integrity Issues: Skin Integrity Issues:: Incisions Incisions: rt thigh incision  Last BM:  11/23/20  Height:   Ht Readings from Last 1 Encounters:  11/18/20 5\' 2"  (1.575 m)    Weight:   Wt Readings from Last 1 Encounters:  11/18/20 65.8 kg    Ideal Body Weight:  50 kg  BMI:  Body mass index is 26.53 kg/m.  Estimated Nutritional Needs:   Kcal:  1750-1950  Protein:  85-100 grams  Fluid:  > 1.7 L    11/20/20, RD, LDN, CDCES Registered Dietitian II Certified Diabetes Care and Education Specialist Please refer to [facility] for RD and/or RD on-call/weekend/after hours pager

## 2020-11-23 NOTE — Progress Notes (Signed)
PROGRESS NOTE    Katrina Velez  ZOX:[id number] DOB: 1926-01-14 DOA: 11/15/2020 PCP: System, Provider Not In   Brief Narrative:  The patient is a 87 year old elderly Caucasian female with a past medical history significant for but not limited to permanent atrial fibrillation on anticoagulation with apixaban, hypothyroidism, osteoarthritis, depression, spinal stenosis of his lumbar spine with neurogenic claudication as well as other comorbidities presented to the ED via EMS complaining of right thigh pain after mechanical fall.  She had no significant abnormalities noted on her laboratory work.  She screened negative for COVID.  X-ray did of the hip showed mild right femoral shaft fracture with overlapping angulated and displaced fragments.  Chest x-ray showed no active disease and the EDP spoke to the orthopedic surgeon who consulted and are planning for surgical intervention on Thursday.  Patient was walking from her bedroom to the kitchen to take her evening medications and slept and fell down to the floor.  She denied hitting her head or loss of consciousness.  She did take Eliquis the night that she fell.  Overnight she became extremely combative and agitated after administration of IV morphine.  She was subsequently given IV Haldol given the agitation. She underwent surgical intervention 11/18/20 and is POD3. Had RVR into the 170's yesterday evening so will go up on Metoprolol Succinate to 50 mg BID.   Patient was confused again this AM and likley has Hospital Delirium. Daughter stated she was 80% lucid yesterday but today she is totally confused. Will check a Bladder Scan to ensure patient is not retaining and obtain Head CT Scan. If remains confused may do further Neurological workup and get Further Imaging. Awaiting SNF placement for Rehabilitative Efforts and family requested Social work to [employer] search.   Assessment & Plan:   Principal Problem:   Femur fracture (HCC) Active Problems:    Hypothyroidism post removal of goiter   Atrial fibrillation (HCC)   Acute blood loss anemia  Right Femur Fracture s/p ORIF of Right Distal Femur Fracture and Right Femoral Shaft POD 5 -In the setting of Fall -No concern for Syncopal Episode -Orthopedic Surgery Consulted with plans for Surgical Intervention and this was done 11/18/20 by Dr. Jena GaussHaddix  -Judicious use of Narcotics -VTE Prophylaxis with Enoxaparin 30 mg sq q24h initially but now changed back to Apixaban 5 mg po BID -Ortho Recommendind PWB RLE 50% and recommending that the Dressings -PT/OT to evaluate and Treat and recommending SNF -Pain Control with Acetaminophen 650 mg po q4hprn Mild Pain, Hydrocodone-Acetaminophen 5-325 mg po q4hprn Severe Pain and Tramadol 50 mg po q6hprn Moderate Pain, WILL NOT USE ANYMORE IV Morphine given her Agitation and combativeness -Vitamin D Level was 32.97  -C/w Robaxin 500 mg po/IV q6hprn Muscle Spasms -IVF Hydration now stopped  -Bowel Regimen with Senna 8.6 mg po 1 tab BID  -C/w Supportive Care  Permanent Atrial Fibrillation now with RVR -Hold Eliquis (Takes 5 mg po BID but will likley need to reduce to 2.5 mg po BID when re-ordered) -C/w with Diltaizem 120 mg po BID; Metoprolol Succinate 25 mg po BID will be increased to 50 mg po BID  -INR was 1.6 -Continue to Monitor on Telemetry and having RVR -HR is much improved in the 80's now   Iatrogenic Hypothyroidism (Post Removal of Goiter) -Checked TSH and was 0.263; Now will Check Free T4 and was elevated at 1.20 so will make adjustments of Levothyroxine  -Will Decrease Levothyroxine 125 mcg po Daily to 100 mcg po Daily  Hyponatremia -Mild and improved. Na+ is now 136 -Continue to Monitor and Trend -Repeat CMP in the AM   Acute Agitation and Confusion, Waxing and Waning and Likely Hospital Delirium, Daughter feels it is improving  -Had significant aggression towards the staff and removed IV initially and was in the setting of Narcotics -In  the setting of Narcotics, specifically IV Morphine 0.5 mg administration -Daughter states the patient had a similar episode 30 years ago after morphine -STOP Morphine -Given 2 mg of IV Haldol overnight -Delirium Precautions -She continues to be Confused and now likely has Hospital Delirium  -Check Bladder Scan, Check U/A and Urine Cx; Will obtain Head Imaging with CT Scan   -Head CT Scan done and showed "No acute intracranial abnormality. Stable non contrast CT appearance of the brain since 2020." -Bladder Scan, Urinalysis and Urine Cx still pending to be done  CKD Stage 3a -BUN/Cr stale at 27/0.76 -IVF now stopped  -Avoid Further Nephrotoxic Medications, Contrast Dyes, and Hypotension and Renally adjust Medications -Repeat CMP in the AM   Normocytic Anemia -Patient was likely Hemoconcentrated on Admission -Hgb/Hct went from 13.1/40.8 -> 8.0/24.7 -> 8.9/26.4 -> 8.1/24.4 -> 8.3/25.1 -> 7.9/24.6 and ? Dilutional Drop and post Surgical Drop; Now Hgb/Hct appears to have stabilized  -Checked Anemia Panel showed an iron level of 18, U IBC of 247, TIBC of 265, saturation ratios of 7%, ferritin level 98, folate level 9.4, vitamin B12 level 495 -We will start iron supplementation with ferrous sulfate 325 mg p.o. daily with breakfast -Continue to Monitor for S/Sx of Bleeding; No overt bleeding noted -Repeat CBC in the AM   Leukocytosis, was improving but slightly worsened  -Likely Reactive in the setting of Fall and Pain initially and now Surgery  -WBC went from 7.8 -> 11.9 -> 15.3 -> 15.8 -> 15.0 -> 14.3 -> 16.8 -Continue to Monitor for S/Sx of Infection and will check U/A and Urine Cx; Urinalysis Negative and showed Clear Appearance with Amber color urine urine with Negatiev Leukocytes, Negative Nitrites and Urine Cx pending  -Currently Afebrile  -Repeat CBC this Afternoon and obtain Blood Cx x2 -Repeat CBC in the AM   Thrombocytosis -Patient's Platelet Count went up and is likely  Reactive -Platelet Count went from 294 -> 323 -> 373 -> 432 -> 482 -Continue to Monitor and Trend and Repeat CBC in the AM   Hypophosphatemia -Mild. Phos Level was 2.2 and now is 2.7 again for the 3rd day in a row -Continue to Monitor and Replete as Necessary -Repeat Phos Level in the AM  Hyperbilirubinemia -Mild and Likely Reactive -Patient's T Bili went from 1.2 -> 1.7 -> 1.4 -> 1.7 -> 1.4 -Continue to Monitor and Trend -Repeat CMP in the AM   DVT prophylaxis: Anticoagulated with Apixaban  Code Status: FULL CODE  Family Communication: Discussed with Son and Daughter at bedside  Disposition Plan: SNF when bed available and Insurance Auth is obtained  Status is: Inpatient  Remains inpatient appropriate because:Unsafe d/c plan, IV treatments appropriate due to intensity of illness or inability to take PO, and Inpatient level of care appropriate due to severity of illness  Dispo: The patient is from: Home              Anticipated d/c is to: SNF              Patient currently is not medically stable to d/c.   Difficult to place patient No  Consultants:  Orthopedic surgery  Procedures: None  Antimicrobials:  Anti-infectives (From admission, onward)    Start     Dose/Rate Route Frequency Ordered Stop   11/19/20 0100  vancomycin (VANCOREADY) IVPB 1000 mg/200 mL        1,000 mg 200 mL/hr over 60 Minutes Intravenous Every 12 hours 11/18/20 1721 11/19/20 0049   11/18/20 1248  vancomycin (VANCOCIN) powder  Status:  Discontinued          As needed 11/18/20 1249 11/18/20 1532   11/18/20 1100  vancomycin (VANCOCIN) IVPB 1000 mg/200 mL premix        1,000 mg 200 mL/hr over 60 Minutes Intravenous On call to O.R. 11/17/20 2136 11/18/20 1350        Subjective: Seen and examined at bedside and she was asleep and daughter asked me not to wake her. Daughter thinks she had a better day yesterday and nurse states she actually got some rest. Felt better after I and O cath. Daughter trying  to get her dentures fixed so she can eat. No other concerns or complaints a this time.   Objective: Vitals:   11/22/20 0741 11/22/20 1200 11/22/20 2251 11/22/20 2256  BP: (!) 96/54 102/63 (!) 98/53 (!) 108/53  Pulse: 70 98  99  Resp: 17 18 18    Temp: 97.7 F (36.5 C) 97.7 F (36.5 C) 98.3 F (36.8 C)   TempSrc: Oral Oral    SpO2: 94% 91% 93%   Weight:      Height:        Intake/Output Summary (Last 24 hours) at 11/23/2020 1051 Last data filed at 11/23/2020 0300 Gross per 24 hour  Intake 1138.49 ml  Output 1000 ml  Net 138.49 ml    Filed Weights   11/16/20 1049 11/18/20 1210  Weight: 65.8 kg 65.8 kg   Examination: Physical Exam:  Constitutional: The patient is a thin elderly Caucasian female in NAD and is resting and somnolent and asleep Eyes: Lids closed  ENMT: External Ears, Nose appear normal.  Neck: Appears normal, supple, no cervical masses, normal ROM, no appreciable thyromegaly; no appreciable JVD Respiratory: Diminished to auscultation bilaterally with coarse breath sounds, no wheezing, rales, rhonchi or crackles. Normal respiratory effort and patient is not tachypenic. No accessory muscle use. Unlabored breathing  Cardiovascular: Irregularly Irregular but not tachycardic, no murmurs / rubs / gallops. S1 and S2 auscultated. Right Leg is wrapped  Abdomen: Soft, non-tender, non-distended. Bowel sounds positive.  GU: Deferred. Musculoskeletal: No clubbing / cyanosis of digits/nails. No joint deformity upper and lower extremities.  Skin: No rashes, lesions, ulcers on a limited skin evaluation. No induration; Warm and dry.  Neurologic: Asleep and not awoken Psychiatric: Impaired judgment and insight. Asleep and drowsy. Normal mood and appropriate affect.   Data Reviewed: I have personally reviewed following labs and imaging studies  CBC: Recent Labs  Lab 11/19/20 0458 11/20/20 0202 11/21/20 0222 11/22/20 0145 11/23/20 0730  WBC 15.3* 15.8* 15.0* 14.3* 16.8*   NEUTROABS 11.6* 12.1* 11.6* 11.2* 13.6*  HGB 8.9* 8.1* 8.3* 7.9* 7.8*  HCT 26.4* 24.4* 25.1* 24.6* 24.8*  MCV 92.3 92.4 93.7 95.0 95.8  PLT 294 323 373 432* 482*    Basic Metabolic Panel: Recent Labs  Lab 11/19/20 0458 11/20/20 0202 11/21/20 0222 11/22/20 0145 11/23/20 0730  NA 135 134* 138 137 136  K 4.2 3.7 3.8 3.6 4.2  CL 103 102 103 107 106  CO2 26 24 27 26 26   GLUCOSE 151* 130* 118* 109* 115*  BUN 14 16 24* 26* 27*  CREATININE 0.67 0.62 0.66 0.72 0.76  CALCIUM 9.0 8.9 8.9 8.8* 8.6*  MG 1.7 2.0 1.9 1.9 1.9  PHOS 2.2* 3.4 2.7 2.7 2.7    GFR: Estimated Creatinine Clearance: 39.1 mL/min (by C-G formula based on SCr of 0.76 mg/dL). Liver Function Tests: Recent Labs  Lab 11/19/20 0458 11/20/20 0202 11/21/20 0222 11/22/20 0145 11/23/20 0730  AST 34 32 32 28 24  ALT 14 18 21 21 23   ALKPHOS 43 41 44 41 41  BILITOT 1.2 1.7* 1.4* 1.7* 1.4*  PROT 5.5* 5.5* 5.2* 4.9* 4.9*  ALBUMIN 2.9* 2.8* 2.6* 2.5* 2.5*    No results for input(s): LIPASE, AMYLASE in the last 168 hours. No results for input(s): AMMONIA in the last 168 hours. Coagulation Profile: No results for input(s): INR, PROTIME in the last 168 hours.  Cardiac Enzymes: No results for input(s): CKTOTAL, CKMB, CKMBINDEX, TROPONINI in the last 168 hours. BNP (last 3 results) No results for input(s): PROBNP in the last 8760 hours. HbA1C: No results for input(s): HGBA1C in the last 72 hours. CBG: No results for input(s): GLUCAP in the last 168 hours. Lipid Profile: No results for input(s): CHOL, HDL, LDLCALC, TRIG, CHOLHDL, LDLDIRECT in the last 72 hours. Thyroid Function Tests: Recent Labs    11/22/20 0145  FREET4 1.20*    Anemia Panel: No results for input(s): VITAMINB12, FOLATE, FERRITIN, TIBC, IRON, RETICCTPCT in the last 72 hours.  Sepsis Labs: No results for input(s): PROCALCITON, LATICACIDVEN in the last 168 hours.  Recent Results (from the past 240 hour(s))  Resp Panel by RT-PCR (Flu A&B,  Covid) Nasopharyngeal Swab     Status: None   Collection Time: 11/16/20  1:25 AM   Specimen: Nasopharyngeal Swab; Nasopharyngeal(NP) swabs in vial transport medium  Result Value Ref Range Status   SARS Coronavirus 2 by RT PCR NEGATIVE NEGATIVE Final    Comment: (NOTE) SARS-CoV-2 target nucleic acids are NOT DETECTED.  The SARS-CoV-2 RNA is generally detectable in upper respiratory specimens during the acute phase of infection. The lowest concentration of SARS-CoV-2 viral copies this assay can detect is 138 copies/mL. A negative result does not preclude SARS-Cov-2 infection and should not be used as the sole basis for treatment or other patient management decisions. A negative result may occur with  improper specimen collection/handling, submission of specimen other than nasopharyngeal swab, presence of viral mutation(s) within the areas targeted by this assay, and inadequate number of viral copies(<138 copies/mL). A negative result must be combined with clinical observations, patient history, and epidemiological information. The expected result is Negative.  Fact Sheet for Patients:  11/18/20  Fact Sheet for Healthcare Providers:  BloggerCourse.com  This test is no t yet approved or cleared by the [employer] FDA and  has been authorized for detection and/or diagnosis of SARS-CoV-2 by FDA under an Emergency Use Authorization (EUA). This EUA will remain  in effect (meaning this test can be used) for the duration of the COVID-19 declaration under Section 564(b)(1) of the Act, 21 U.S.C.section 360bbb-3(b)(1), unless the authorization is terminated  or revoked sooner.       Influenza A by PCR NEGATIVE NEGATIVE Final   Influenza B by PCR NEGATIVE NEGATIVE Final    Comment: (NOTE) The Xpert Xpress SARS-CoV-2/FLU/RSV plus assay is intended as an aid in the diagnosis of influenza from Nasopharyngeal swab specimens and should  not be used as a sole basis for treatment. Nasal washings and aspirates are unacceptable for Xpert Xpress SARS-CoV-2/FLU/RSV testing.  Fact Sheet for  Patients: BloggerCourse.com  Fact Sheet for Healthcare Providers: SeriousBroker.it  This test is not yet approved or cleared by the [place] FDA and has been authorized for detection and/or diagnosis of SARS-CoV-2 by FDA under an Emergency Use Authorization (EUA). This EUA will remain in effect (meaning this test can be used) for the duration of the COVID-19 declaration under Section 564(b)(1) of the Act, 21 U.S.C. section 360bbb-3(b)(1), unless the authorization is terminated or revoked.  Performed at [facility], [address], [place], [place] [address]   Surgical PCR screen     Status: None   Collection Time: 11/16/20 10:52 AM   Specimen: Nasal Mucosa; Nasal Swab  Result Value Ref Range Status   MRSA, PCR NEGATIVE NEGATIVE Final   Staphylococcus aureus NEGATIVE NEGATIVE Final    Comment: (NOTE) The Xpert SA Assay (FDA approved for NASAL specimens in patients 78 years of age and older), is one component of a comprehensive surveillance program. It is not intended to diagnose infection nor to guide or monitor treatment. Performed at [facility], [address] [place], [place] [address]    RN Pressure Injury Documentation:     Estimated body mass index is 26.53 kg/m as calculated from the following:   Height as of this encounter: 5\' 2"  (1.575 m).   Weight as of this encounter: 65.8 kg.  Malnutrition Type: Nutrition Problem: Inadequate oral intake Etiology: decreased appetite Malnutrition Characteristics: Signs/Symptoms: per patient/family report Nutrition Interventions: Interventions: Boost Plus, MVI Radiology Studies: CT HEAD WO CONTRAST  Result Date: 11/21/2020 CLINICAL DATA:  87 year old female with unexplained altered mental status. EXAM: CT  HEAD WITHOUT CONTRAST TECHNIQUE: Contiguous axial images were obtained from the base of the skull through the vertex without intravenous contrast. COMPARISON:  Head CT 12/22/2018. FINDINGS: Brain: Cerebral volume is not significantly changed since 2020. No midline shift, ventriculomegaly, mass effect, evidence of mass lesion, intracranial hemorrhage or evidence of cortically based acute infarction. No cortical encephalomalacia identified. Patchy and confluent bilateral white matter hypodensity appears stable. Vascular: Calcified atherosclerosis at the skull base. No suspicious intracranial vascular hyperdensity. Skull: No acute osseous abnormality identified. Hyperostosis, normal variant. Sinuses/Orbits: Visualized paranasal sinuses and mastoids are stable and well aerated. Other: No acute orbit or scalp soft tissue finding. IMPRESSION: No acute intracranial abnormality. Stable non contrast CT appearance of the brain since 2020. Electronically Signed   By: 2021 M.D.   On: 11/21/2020 11:44    Scheduled Meds:  sodium chloride   Intravenous Once   sodium chloride   Intravenous Once   apixaban  5 mg Oral BID   diltiazem  120 mg Oral BID   docusate sodium  100 mg Oral BID   ferrous sulfate  325 mg Oral Q breakfast   lactose free nutrition  237 mL Oral TID WC   levothyroxine  100 mcg Oral Q0600   metoprolol succinate  50 mg Oral BID   mirtazapine  7.5 mg Oral QHS   multivitamin with minerals  1 tablet Oral Daily   senna  1 tablet Oral BID   Continuous Infusions:  methocarbamol (ROBAXIN) IV      LOS: 7 days   01/22/2021, DO [facility] PAGER is on AMION  If 7PM-7AM, please contact night-coverage [email or web address]

## 2020-11-23 NOTE — Plan of Care (Signed)
  Problem: Pain Managment: Goal: General experience of comfort will improve Outcome: Progressing   Problem: Safety: Goal: Ability to remain free from injury will improve Outcome: Progressing   

## 2020-11-23 NOTE — Progress Notes (Signed)
**Note Katrina-Identified via Obfuscation** Physical Therapy Treatment Patient Details Name: Katrina Velez MRN: [id number] DOB: March 08, 1926 Today's Date: 11/23/2020    History of Present Illness Pt is a 87 year old woman admitted after mechanical fall at home on 11/15/20 resulting in R distal femur fx. Underwent ORIF on 11/18/20. Hospital course complicated by combativeness from morphine. PMH: afib on anticoagulation, hypothyroidism, OA, depression, spinal stenosis, CKD, COPD, COPD, B shoulder replacements, B knee replacements.    PT Comments    Pt with improved transfer ability and R LE Wbing tolerance today. We initiated gait training however pt fatigued quickly limiting distance. Pt able to tolerate HEP with minimal pain. Acute PT to cont to follow. Continue to recommend SNF upon d/c.    Follow Up Recommendations  SNF;Supervision/Assistance - 24 hour     Equipment Recommendations   (defer to post acute)    Recommendations for Other Services       Precautions / Restrictions Precautions Precautions: Fall Precaution Comments: HOH Restrictions Weight Bearing Restrictions: Yes RLE Weight Bearing: Weight bearing as tolerated    Mobility  Bed Mobility Overal bed mobility: Needs Assistance Bed Mobility: Supine to Sit     Supine to sit: HOB elevated;Min assist     General bed mobility comments: minA for R LE management, max directional verbal cues, use of bilat UEs, pt initiated movement of R LE    Transfers Overall transfer level: Needs assistance Equipment used: Rolling walker (2 wheeled) Transfers: Sit to/from Stand Sit to Stand: Max assist;From elevated surface         General transfer comment: maxA to power up, verbal cues for hand placement, pt with R lateral lean, leaning on PT, verbal and tactile cues provided to stand in middle of walker  Ambulation/Gait Ambulation/Gait assistance: Mod assist;+2 physical assistance;+2 safety/equipment Gait Distance (Feet): 3 Feet Assistive device: Rolling walker (2  wheeled) Gait Pattern/deviations: Step-to pattern;Decreased stance time - right;Decreased step length - left;Narrow base of support Gait velocity: slow transitioning to impulsively fast   General Gait Details: pt with retropulsion initially then began to take steps with tactile cues posteriorly and began to speed up and then states, "I am tired" and had significant posterior lean requiring tech to push chair under patient to prevent fall   Stairs             Wheelchair Mobility    Modified Rankin (Stroke Patients Only)       Balance Overall balance assessment: Needs assistance Sitting-balance support: Bilateral upper extremity supported;Feet supported Sitting balance-Leahy Scale: Fair Sitting balance - Comments: pt able to sit unassisted holding onto bed rail and edge of bed Postural control: Posterior lean;Right lateral lean Standing balance support: Bilateral upper extremity supported Standing balance-Leahy Scale: Poor Standing balance comment: reliant on external assist and BUE support                            Cognition Arousal/Alertness: Awake/alert Behavior During Therapy: WFL for tasks assessed/performed Overall Cognitive Status: Impaired/Different from baseline Area of Impairment: Safety/judgement;Attention                   Current Attention Level: Focused     Safety/Judgement: Decreased awareness of safety     General Comments: pt's family reports mild confusion but much better than day of admission, pt very HOH affecting command following      Exercises General Exercises - Lower Extremity Ankle Circles/Pumps: 10 reps;AROM;Seated;Other (comment);Right (in recliner) Quad Sets: AROM;Both;10 reps;Supine  Gluteal Sets: AROM;Right;10 reps;Seated Long Arc Quad: AAROM;Right;10 reps;Seated    General Comments General comments (skin integrity, edema, etc.): R LE with noted bruising, VSS      Pertinent Vitals/Pain Pain Assessment:  Faces Faces Pain Scale: Hurts little more Pain Location: R hip Pain Descriptors / Indicators: Grimacing;Guarding;Discomfort Pain Intervention(s): Limited activity within patient's tolerance    Home Living                      Prior Function            PT Goals (current goals can now be found in the care plan section) Acute Rehab PT Goals Patient Stated Goal: to get to chair PT Goal Formulation: With patient Time For Goal Achievement: 12/03/20 Potential to Achieve Goals: Good Progress towards PT goals: Progressing toward goals    Frequency    Min 3X/week      PT Plan Current plan remains appropriate    Co-evaluation              AM-PAC PT "6 Clicks" Mobility   Outcome Measure  Help needed turning from your back to your side while in a flat bed without using bedrails?: A Little Help needed moving from lying on your back to sitting on the side of a flat bed without using bedrails?: A Lot Help needed moving to and from a bed to a chair (including a wheelchair)?: A Lot Help needed standing up from a chair using your arms (e.g., wheelchair or bedside chair)?: A Lot Help needed to walk in hospital room?: A Lot Help needed climbing 3-5 steps with a railing? : Total 6 Click Score: 12    End of Session Equipment Utilized During Treatment: Gait belt Activity Tolerance: Patient tolerated treatment well;Patient limited by fatigue Patient left: with call bell/phone within reach;with family/visitor present;in chair;with chair alarm set Nurse Communication: Mobility status PT Visit Diagnosis: Other abnormalities of gait and mobility (R26.89);Muscle weakness (generalized) (M62.81);Pain Pain - Right/Left: Right Pain - part of body: Hip;Knee;Leg     Time: 2202-5427 PT Time Calculation (min) (ACUTE ONLY): 22 min  Charges:  $Gait Training: 8-22 mins                     Lewis Shock, PT, DPT Acute Rehabilitation Services Pager #: [phone] Office #:  [phone]    Iona Hansen 11/23/2020, 2:25 PM

## 2020-11-23 NOTE — TOC Progression Note (Addendum)
Transition of Care [facility]) - Progression Note    Patient Details  Name: Katrina Velez MRN: [id number] Date of Birth: February 24, 1926  Transition of Care [facility]) CM/SW Contact  Ralene Bathe, LCSWA Phone Number: 11/23/2020, 8:35 AM  Clinical Narrative:    08:35-  CSW called Lowella Bandy with [facility] to inquire about their ability to accept the patient.  There was no answer.  CSW left a VM requesting a returned call.   10:04-  CSW received a returned call from [facility].  They are willing to accept the patient.   10:20:  CSW called the patient's daughter, Harriett Sine, to inform of [facility] acceptance.  The daughter reported that now the family's first choice is [facility] and second would be [facility].  The daughter stated that she spoke with French Ana at [facility] and was informed that the patient could be accepted.    10:22- CSW called French Ana with admissions at [facility].  CSW was informed that the [employer] and H&P will need to be faxed over.  CSW will fax information to [facility].   14:00-  CSW received a call from the patient's daughter inquiring about whether [facility] has accepted.  CSW observed that the facility has now accepted the patient in the HUB.  Patient's daughter and care team notified.     Expected Discharge Plan: Skilled Nursing Facility Barriers to Discharge: Continued Medical Work up  Expected Discharge Plan and Services Expected Discharge Plan: Skilled Nursing Facility                                               Social Determinants of Health (SDOH) Interventions    Readmission Risk Interventions No flowsheet data found.

## 2020-11-24 DIAGNOSIS — I10 Essential (primary) hypertension: Secondary | ICD-10-CM | POA: Diagnosis not present

## 2020-11-24 DIAGNOSIS — W19XXXA Unspecified fall, initial encounter: Secondary | ICD-10-CM | POA: Diagnosis not present

## 2020-11-24 DIAGNOSIS — D6869 Other thrombophilia: Secondary | ICD-10-CM | POA: Diagnosis not present

## 2020-11-24 DIAGNOSIS — S72309A Unspecified fracture of shaft of unspecified femur, initial encounter for closed fracture: Secondary | ICD-10-CM | POA: Insufficient documentation

## 2020-11-24 DIAGNOSIS — R2681 Unsteadiness on feet: Secondary | ICD-10-CM | POA: Insufficient documentation

## 2020-11-24 DIAGNOSIS — S72301A Unspecified fracture of shaft of right femur, initial encounter for closed fracture: Secondary | ICD-10-CM

## 2020-11-24 DIAGNOSIS — S7291XA Unspecified fracture of right femur, initial encounter for closed fracture: Secondary | ICD-10-CM | POA: Diagnosis not present

## 2020-11-24 DIAGNOSIS — R410 Disorientation, unspecified: Secondary | ICD-10-CM | POA: Diagnosis not present

## 2020-11-24 DIAGNOSIS — S79929A Unspecified injury of unspecified thigh, initial encounter: Secondary | ICD-10-CM | POA: Diagnosis not present

## 2020-11-24 DIAGNOSIS — I4821 Permanent atrial fibrillation: Secondary | ICD-10-CM | POA: Diagnosis not present

## 2020-11-24 DIAGNOSIS — Z7901 Long term (current) use of anticoagulants: Secondary | ICD-10-CM | POA: Diagnosis not present

## 2020-11-24 DIAGNOSIS — K5900 Constipation, unspecified: Secondary | ICD-10-CM | POA: Insufficient documentation

## 2020-11-24 DIAGNOSIS — J301 Allergic rhinitis due to pollen: Secondary | ICD-10-CM | POA: Diagnosis not present

## 2020-11-24 DIAGNOSIS — W19XXXD Unspecified fall, subsequent encounter: Secondary | ICD-10-CM | POA: Diagnosis not present

## 2020-11-24 DIAGNOSIS — S72301D Unspecified fracture of shaft of right femur, subsequent encounter for closed fracture with routine healing: Secondary | ICD-10-CM | POA: Diagnosis not present

## 2020-11-24 DIAGNOSIS — E871 Hypo-osmolality and hyponatremia: Secondary | ICD-10-CM | POA: Diagnosis not present

## 2020-11-24 DIAGNOSIS — E039 Hypothyroidism, unspecified: Secondary | ICD-10-CM | POA: Diagnosis not present

## 2020-11-24 DIAGNOSIS — F329 Major depressive disorder, single episode, unspecified: Secondary | ICD-10-CM | POA: Insufficient documentation

## 2020-11-24 DIAGNOSIS — M1990 Unspecified osteoarthritis, unspecified site: Secondary | ICD-10-CM | POA: Diagnosis not present

## 2020-11-24 DIAGNOSIS — M48062 Spinal stenosis, lumbar region with neurogenic claudication: Secondary | ICD-10-CM | POA: Diagnosis not present

## 2020-11-24 DIAGNOSIS — M6281 Muscle weakness (generalized): Secondary | ICD-10-CM | POA: Diagnosis not present

## 2020-11-24 DIAGNOSIS — Z4789 Encounter for other orthopedic aftercare: Secondary | ICD-10-CM | POA: Diagnosis not present

## 2020-11-24 DIAGNOSIS — S91301A Unspecified open wound, right foot, initial encounter: Secondary | ICD-10-CM | POA: Diagnosis not present

## 2020-11-24 DIAGNOSIS — Z7401 Bed confinement status: Secondary | ICD-10-CM | POA: Diagnosis not present

## 2020-11-24 DIAGNOSIS — R41841 Cognitive communication deficit: Secondary | ICD-10-CM | POA: Diagnosis not present

## 2020-11-24 DIAGNOSIS — D72829 Elevated white blood cell count, unspecified: Secondary | ICD-10-CM | POA: Diagnosis not present

## 2020-11-24 DIAGNOSIS — S91301D Unspecified open wound, right foot, subsequent encounter: Secondary | ICD-10-CM | POA: Diagnosis not present

## 2020-11-24 DIAGNOSIS — I4891 Unspecified atrial fibrillation: Secondary | ICD-10-CM | POA: Diagnosis not present

## 2020-11-24 DIAGNOSIS — R0902 Hypoxemia: Secondary | ICD-10-CM | POA: Diagnosis not present

## 2020-11-24 DIAGNOSIS — Z4889 Encounter for other specified surgical aftercare: Secondary | ICD-10-CM | POA: Diagnosis not present

## 2020-11-24 DIAGNOSIS — M25551 Pain in right hip: Secondary | ICD-10-CM | POA: Diagnosis not present

## 2020-11-24 DIAGNOSIS — F419 Anxiety disorder, unspecified: Secondary | ICD-10-CM | POA: Diagnosis not present

## 2020-11-24 DIAGNOSIS — N1831 Chronic kidney disease, stage 3a: Secondary | ICD-10-CM | POA: Diagnosis not present

## 2020-11-24 DIAGNOSIS — Z48810 Encounter for surgical aftercare following surgery on the sense organs: Secondary | ICD-10-CM | POA: Insufficient documentation

## 2020-11-24 LAB — CBC WITH DIFFERENTIAL/PLATELET
Abs Immature Granulocytes: 0.7 10*3/uL — ABNORMAL HIGH (ref 0.00–0.07)
Basophils Absolute: 0.1 10*3/uL (ref 0.0–0.1)
Basophils Relative: 0 %
Eosinophils Absolute: 0.3 10*3/uL (ref 0.0–0.5)
Eosinophils Relative: 2 %
HCT: 27.4 % — ABNORMAL LOW (ref 36.0–46.0)
Hemoglobin: 8.9 g/dL — ABNORMAL LOW (ref 12.0–15.0)
Immature Granulocytes: 4 %
Lymphocytes Relative: 10 %
Lymphs Abs: 2 10*3/uL (ref 0.7–4.0)
MCH: 30.9 pg (ref 26.0–34.0)
MCHC: 32.5 g/dL (ref 30.0–36.0)
MCV: 95.1 fL (ref 80.0–100.0)
Monocytes Absolute: 1.7 10*3/uL — ABNORMAL HIGH (ref 0.1–1.0)
Monocytes Relative: 9 %
Neutro Abs: 14.6 10*3/uL — ABNORMAL HIGH (ref 1.7–7.7)
Neutrophils Relative %: 75 %
Platelets: 547 10*3/uL — ABNORMAL HIGH (ref 150–400)
RBC: 2.88 MIL/uL — ABNORMAL LOW (ref 3.87–5.11)
RDW: 14.7 % (ref 11.5–15.5)
WBC: 19.3 10*3/uL — ABNORMAL HIGH (ref 4.0–10.5)
nRBC: 0.3 % — ABNORMAL HIGH (ref 0.0–0.2)

## 2020-11-24 LAB — COMPREHENSIVE METABOLIC PANEL
ALT: 23 U/L (ref 0–44)
AST: 27 U/L (ref 15–41)
Albumin: 2.7 g/dL — ABNORMAL LOW (ref 3.5–5.0)
Alkaline Phosphatase: 56 U/L (ref 38–126)
Anion gap: 6 (ref 5–15)
BUN: 21 mg/dL (ref 8–23)
CO2: 24 mmol/L (ref 22–32)
Calcium: 8.6 mg/dL — ABNORMAL LOW (ref 8.9–10.3)
Chloride: 105 mmol/L (ref 98–111)
Creatinine, Ser: 0.68 mg/dL (ref 0.44–1.00)
GFR, Estimated: >60 mL/min (ref >60)
Glucose, Bld: 107 mg/dL — ABNORMAL HIGH (ref 70–99)
Potassium: 3.8 mmol/L (ref 3.5–5.1)
Sodium: 135 mmol/L (ref 135–145)
Total Bilirubin: 1.3 mg/dL — ABNORMAL HIGH (ref 0.3–1.2)
Total Protein: 5.1 g/dL — ABNORMAL LOW (ref 6.5–8.1)

## 2020-11-24 LAB — MAGNESIUM: Magnesium: 1.9 mg/dL (ref 1.7–2.4)

## 2020-11-24 LAB — PHOSPHORUS: Phosphorus: 2.6 mg/dL (ref 2.5–4.6)

## 2020-11-24 MED ORDER — LEVOTHYROXINE SODIUM 100 MCG PO TABS: 100.0000 ug | ORAL_TABLET | Freq: Every day | ORAL | Status: DC

## 2020-11-24 NOTE — TOC Transition Note (Signed)
Transition of Care [facility]) - CM/SW Discharge Note   Patient Details  Name: Katrina Velez MRN: [id number] Date of Birth: [birth year]  Transition of Care [facility]) CM/SW Contact:  Erin Sons, LCSW Phone Number: 11/24/2020, 11:05 AM   Clinical Narrative:     Patient will DC to: [facility] [place] Anticipated DC date: 11/24/20 Family notified: Lindwood Qua (Daughter)  [phone] (Mobile) Transport by: Sharin Mons   Per MD patient ready for DC to . RN, patient, patient's family, and facility notified of DC. Discharge Summary and FL2 sent to facility. RN to call report prior to discharge [phone] Room 207). DC packet on chart. Ambulance transport requested for patient.   CSW will sign off for now as social work intervention is no longer needed. Please consult [place] again if new needs arise.   Final next level of care: Skilled Nursing Facility Barriers to Discharge: Continued Medical Work up   Patient Goals and CMS Choice   CMS Medicare.gov Compare Post Acute Care list provided to:: Patient Represenative (must comment) Choice offered to / list presented to : Adult Children  Discharge Placement              Patient chooses bed at: [facility] [place] Patient to be transferred to facility by: [facility] Name of family member notified: Lindwood Qua (Daughter)   [phone] (Mobile) Patient and family notified of of transfer: 11/24/20  Discharge Plan and Services                                     Social Determinants of Health (SDOH) Interventions     Readmission Risk Interventions No flowsheet data found.

## 2020-11-24 NOTE — Progress Notes (Signed)
Around 2130 tonight patient started with some confusion and agitation.  She has been pulling off her heart monitor and staff have to keep placing it back on her which is adding to her agitation.  Will contact on call to see if we can discontinue telemetry.  Patient is refusing staff to place monitor back on her.   2836 Staff tried to place telemetry leads on patient's back in hopes that she will keep tele box on.  Patient started yelling at staff, "leave me alone! Why are you all bothering me! I don't understand why you are all trying to hurt me!"  Will contact on call to see if we can dc tele.  0125 Received an order to dc telemetry from on - call MD/NP.

## 2020-11-24 NOTE — Care Management Important Message (Signed)
Important Message  Patient Details  Name: Katrina Velez MRN: [id number] Date of Birth: [birth year]   Medicare Important Message Given:  Yes     Javonta Gronau P Mafalda Mcginniss 11/24/2020, 1:06 PM

## 2020-11-24 NOTE — Plan of Care (Signed)
  Problem: Education: Goal: Knowledge of General Education information will improve Description: Including pain rating scale, medication(s)/side effects and non-pharmacologic comfort measures Outcome: Adequate for Discharge   Problem: Health Behavior/Discharge Planning: Goal: Ability to manage health-related needs will improve Outcome: Adequate for Discharge   Problem: Clinical Measurements: Goal: Ability to maintain clinical measurements within normal limits will improve Outcome: Adequate for Discharge Goal: Will remain free from infection Outcome: Adequate for Discharge Goal: Diagnostic test results will improve Outcome: Adequate for Discharge Goal: Respiratory complications will improve Outcome: Adequate for Discharge Goal: Cardiovascular complication will be avoided Outcome: Adequate for Discharge   Problem: Activity: Goal: Risk for activity intolerance will decrease Outcome: Adequate for Discharge   Problem: Nutrition: Goal: Adequate nutrition will be maintained Outcome: Adequate for Discharge   Problem: Coping: Goal: Level of anxiety will decrease Outcome: Adequate for Discharge   Problem: Elimination: Goal: Will not experience complications related to bowel motility Outcome: Adequate for Discharge Goal: Will not experience complications related to urinary retention Outcome: Adequate for Discharge   Problem: Pain Managment: Goal: General experience of comfort will improve Outcome: Adequate for Discharge   Problem: Safety: Goal: Ability to remain free from injury will improve Outcome: Adequate for Discharge   Problem: Skin Integrity: Goal: Risk for impaired skin integrity will decrease Outcome: Adequate for Discharge   Problem: Inadequate Intake (NI-2.1) Goal: Food and/or nutrient delivery Description: Individualized approach for food/nutrient provision. Outcome: Adequate for Discharge   Problem: Acute Rehab OT Goals (only OT should resolve) Goal: Pt.  Will Perform Grooming Outcome: Adequate for Discharge Goal: Pt. Will Perform Upper Body Dressing Outcome: Adequate for Discharge Goal: Pt. Will Transfer To Toilet Outcome: Adequate for Discharge Goal: OT Additional ADL Goal #1 Outcome: Adequate for Discharge Goal: OT Additional ADL Goal #2 Outcome: Adequate for Discharge   Problem: Acute Rehab PT Goals(only PT should resolve) Goal: Pt will Roll Supine to Side Outcome: Adequate for Discharge Goal: Pt Will Go Supine/Side To Sit Outcome: Adequate for Discharge Goal: Pt Will Go Sit To Supine/Side Outcome: Adequate for Discharge Goal: Patient Will Transfer Sit To/From Stand Outcome: Adequate for Discharge Goal: Pt Will Transfer Bed To Chair/Chair To Bed Outcome: Adequate for Discharge Goal: Pt Will Ambulate Outcome: Adequate for Discharge Goal: Pt/caregiver will Perform Home Exercise Program Outcome: Adequate for Discharge

## 2020-11-24 NOTE — Progress Notes (Signed)
    BRIEF OVERNIGHT PROGRESS REPORT   Notified by RN in reference to patient being again agitated, sometimes combative and refusing to allow monitoring of vitals, She has removed telemetry and verbally refused the device constantly over the period since her admission.  Medications have proven to make her more agitated and confused during her stay. Due to requirements in reference to remote telemetry monitoring, this device may be discontinued for the remainder of the night with close staff monitoring due to her combative condition and physically aggressive removing of the device.   Chinita Greenland MSNA ACNPC-AG Acute Care Nurse Practitioner [facility]

## 2020-11-24 NOTE — Discharge Summary (Signed)
Physician Discharge Summary  Katrina Velez ZOX:[id number] DOB: 08-12-1925 DOA: 11/15/2020  PCP: System, Provider Not In  Admit date: 11/15/2020 Discharge date: 11/24/2020  Admitted From: home Disposition:  SNF  Recommendations for Outpatient Follow-up:  Follow up with PCP in 1-2 weeks Please obtain BMP/CBC in one week  Home Health: none Equipment/Devices: none  Discharge Condition: stable CODE STATUS: DNR Diet recommendation: regular  HPI: Per admitting MD, Katrina Velez is a 87 y.o. female with medical history significant of permanent A. fib on Eliquis, hypothyroidism, osteoarthritis, depression, spinal stenosis of lumbar region with neurogenic claudication presented to the ED via EMS complaining of right thigh pain after a mechanical fall.  No significant lab abnormalities.  Screening COVID test negative.  X-ray showing mild right femoral shaft fracture with overlapping angulated and displaced fragments.  Chest x-ray showing no active disease. ED PA [facility], will consult in a.m. Patient states she was walking from her bedroom to her kitchen to take her evening medications and somehow slipped and slid down the wall onto the floor.  Since then she is having a lot of pain in her right thigh.  Denies hitting her head or loss of consciousness.  She did take Eliquis around 10 PM this evening.  She has no other complaints.  Denies lightheadedness/dizziness, chest pain, shortness of breath, cough, nausea, vomiting, abdominal pain, diarrhea, or dysuria.  Daughter at bedside states confirms that the patient was in her usual state of health until this evening.  She had dinner with family and was not having any problems prior to this fall.  Hospital Course / Discharge diagnoses: Principal problem Right femur fracture-patient was admitted to the hospital after a fall and right femur fracture.  Orthopedic surgery consulted and followed patient while hospitalized,  she is status post ORIF of her fracture.  She recovered well, PT recommending SNF and will be discharged in stable condition.  Active problems Permanent A. fib, RVR-patient had few RVR episodes while hospitalized likely in the setting of acute illness.  TSH was also found to be slightly suppressed.  Continue Eliquis, diltiazem, metoprolol Hypothyroidism-continue Synthroid and lower dose given suppressed TSH Hyponatremia-mild, improved In hospital delirium-waxing and waning, overall stable and gradually improving per family Chronic kidney disease stage IIIa-creatinine at baseline Leukocytosis-likely reactive in the setting of hip fracture, surgery.  She is afebrile, cultures are unremarkable and there is no clinical suspicion for infection at this point Normocytic anemia-stable Isolated bilirubin elevation-of unclear clinical significance  Sepsis ruled out   Discharge Instructions   Allergies as of 11/24/2020       Reactions   Banana Nausea Only   All banana products also   Penicillins Hives        Medication List     TAKE these medications    ANTIHISTAMINE PO Take 1 tablet by mouth daily as needed (runny nose).   apixaban 5 MG Tabs tablet Commonly known as: Eliquis Take 1 tablet (5 mg total) by mouth 2 (two) times daily.   docusate sodium 100 MG capsule Commonly known as: Colace Take 1 capsule (100 mg total) by mouth daily as needed for mild constipation.   levothyroxine 100 MCG tablet Commonly known as: SYNTHROID Take 1 tablet (100 mcg total) by mouth daily at 6 (six) AM. Start taking on: November 25, 2020 What changed:  medication strength how much to take when to take this   mirtazapine 7.5 MG tablet Commonly known as: REMERON Take 1 tablet (7.5  mg total) by mouth at bedtime.   traMADol 50 MG tablet Commonly known as: ULTRAM Take 1 tablet (50 mg total) by mouth 2 (two) times daily as needed for moderate pain or severe pain.       ASK your doctor about these  medications    Cardizem LA 120 MG 24 hr tablet Generic drug: diltiazem TAKE 1 TABLET (120 MG TOTAL) BY MOUTH 2 (TWO) TIMES DAILY.   furosemide 20 MG tablet Commonly known as: LASIX TAKE 1 TABLET DAILY AS NEEDED FOR WEIGHT GAIN >3LBS.   metoprolol succinate 25 MG 24 hr tablet Commonly known as: TOPROL-XL TAKE 1 TABLET BY MOUTH TWICE A DAY        Follow-up Information     Haddix, Gillie Manners, MD. Schedule an appointment as soon as possible for a visit in 2 week(s).   Specialty: Orthopedic Surgery Why: FOR REPEAT X-RAYS AND WOUND CHECK Contact information: [address] [place] [address] [phone]                 Consultations: Orthopedic surgery   Procedures/Studies:  DG Pelvis 1-2 Views  Result Date: 11/16/2020 CLINICAL DATA:  Fall, preop EXAM: PELVIS - 1-2 VIEW COMPARISON:  None. FINDINGS: Right hip replacement. There is a right mid femoral fracture image below the femoral stem. This is angulated. Joint space narrowing and spurring in the left hip. Calcified fibroid in the mid pelvis. IMPRESSION: Angulated mid right femoral fracture below the hip replacement stem. Electronically Signed   By: Charlett Nose M.D.   On: 11/16/2020 00:59   CT HEAD WO CONTRAST  Result Date: 11/21/2020 CLINICAL DATA:  87 year old female with unexplained altered mental status. EXAM: CT HEAD WITHOUT CONTRAST TECHNIQUE: Contiguous axial images were obtained from the base of the skull through the vertex without intravenous contrast. COMPARISON:  Head CT 12/22/2018. FINDINGS: Brain: Cerebral volume is not significantly changed since 2020. No midline shift, ventriculomegaly, mass effect, evidence of mass lesion, intracranial hemorrhage or evidence of cortically based acute infarction. No cortical encephalomalacia identified. Patchy and confluent bilateral white matter hypodensity appears stable. Vascular: Calcified atherosclerosis at the skull base. No suspicious intracranial vascular  hyperdensity. Skull: No acute osseous abnormality identified. Hyperostosis, normal variant. Sinuses/Orbits: Visualized paranasal sinuses and mastoids are stable and well aerated. Other: No acute orbit or scalp soft tissue finding. IMPRESSION: No acute intracranial abnormality. Stable non contrast CT appearance of the brain since 2020. Electronically Signed   By: Odessa Fleming M.D.   On: 11/21/2020 11:44   DG Chest Port 1 View  Result Date: 11/16/2020 CLINICAL DATA:  Fall, preop EXAM: PORTABLE CHEST 1 VIEW COMPARISON:  06/05/2015 FINDINGS: Cardiomegaly. Aortic atherosclerosis. Lungs clear. No effusions or edema. No acute bony abnormality. IMPRESSION: Cardiomegaly.  No active disease. Electronically Signed   By: Charlett Nose M.D.   On: 11/16/2020 00:57   DG C-Arm 1-60 Min  Result Date: 11/18/2020 CLINICAL DATA:  ORIF distal femur fracture. EXAM: DG C-ARM 1-60 MIN; RIGHT FEMUR 2 VIEWS FLUOROSCOPY TIME:  Fluoroscopy Time:  2 minutes Radiation Exposure Index (if provided by the fluoroscopic device): 4.98 mGy. Number of Acquired Spot Images: 12 COMPARISON:  November 16, 2020. FINDINGS: Twelve C-arm fluoroscopic images were obtained intraoperatively and submitted for post operative interpretation. These images demonstrate fixation of a a mid femoral shaft fracture with plate and screws and cerclage wires. Improved alignment, near anatomic. Partially imaged right total hip and right total knee arthroplasties. Expected surrounding soft tissue swelling and gas. Please see  the performing provider's procedural report for further detail. IMPRESSION: Intraoperative fluoroscopy, as detailed above. Electronically Signed   By: Feliberto Harts MD   On: 11/18/2020 15:42   DG FEMUR, MIN 2 VIEWS RIGHT  Result Date: 11/18/2020 CLINICAL DATA:  ORIF distal femur fracture. EXAM: DG C-ARM 1-60 MIN; RIGHT FEMUR 2 VIEWS FLUOROSCOPY TIME:  Fluoroscopy Time:  2 minutes Radiation Exposure Index (if provided by the fluoroscopic device): 4.98  mGy. Number of Acquired Spot Images: 12 COMPARISON:  November 16, 2020. FINDINGS: Twelve C-arm fluoroscopic images were obtained intraoperatively and submitted for post operative interpretation. These images demonstrate fixation of a a mid femoral shaft fracture with plate and screws and cerclage wires. Improved alignment, near anatomic. Partially imaged right total hip and right total knee arthroplasties. Expected surrounding soft tissue swelling and gas. Please see the performing provider's procedural report for further detail. IMPRESSION: Intraoperative fluoroscopy, as detailed above. Electronically Signed   By: Feliberto Harts MD   On: 11/18/2020 15:42   DG Femur Min 2 Views Right  Result Date: 11/16/2020 CLINICAL DATA:  Fall EXAM: RIGHT FEMUR 2 VIEWS COMPARISON:  None. FINDINGS: There is a right hip replacement. Mid right femoral fracture is noted below the femoral stem with angulation, displacement and overlapping fragments. No subluxation or dislocation. IMPRESSION: Mid right femoral shaft fracture with overlapping, angulated and displaced fragments. Electronically Signed   By: Charlett Nose M.D.   On: 11/16/2020 01:00   DG FEMUR PORT, MIN 2 VIEWS RIGHT  Result Date: 11/18/2020 CLINICAL DATA:  Femur fracture EXAM: RIGHT FEMUR PORTABLE 2 VIEW COMPARISON:  11/18/2020 FINDINGS: Patient is status post right hip and knee replacements. Interval surgical plate and multiple screw fixation of the femur for mid to distal femoral shaft fracture. Mild residual displacement but overall decreased angulation and displacement compared to prior. Placement of cerclage wires about the mid femoral shaft. Slight gap between the fixating plate and cortex of femur measuring to 3 mm maximum at the level of the midshaft. Gas in the soft tissues consistent with recent surgery. IMPRESSION: Prior right hip and knee replacements with interval surgical plate and multiple screw fixation of the right femur for mid to distal femoral  shaft fracture. Electronically Signed   By: Jasmine Pang M.D.   On: 11/18/2020 19:15     Subjective: - no chest pain, shortness of breath, no abdominal pain, nausea or vomiting.   Discharge Exam: BP (!) 104/51 (BP Location: Left Arm)   Pulse 90   Temp 98.5 F (36.9 C) (Oral)   Resp 16   Ht  (1.575 m)   Wt 65.8 kg   SpO2 94%   BMI 26.53 kg/m   General: Pt is alert, awake, not in acute distress Cardiovascular: RRR, S1/S2 +, no rubs, no gallops Respiratory: CTA bilaterally, no wheezing, no rhonchi Abdominal: Soft, NT, ND, bowel sounds + Extremities: no edema, no cyanosis    The results of significant diagnostics from this hospitalization (including imaging, microbiology, ancillary and laboratory) are listed below for reference.     Microbiology: Recent Results (from the past 240 hour(s))  Resp Panel by RT-PCR (Flu A&B, Covid) Nasopharyngeal Swab     Status: None   Collection Time: 11/16/20  1:25 AM   Specimen: Nasopharyngeal Swab; Nasopharyngeal(NP) swabs in vial transport medium  Result Value Ref Range Status   SARS Coronavirus 2 by RT PCR NEGATIVE NEGATIVE Final    Comment: (NOTE) SARS-CoV-2 target nucleic acids are NOT DETECTED.  The SARS-CoV-2 RNA is  generally detectable in upper respiratory specimens during the acute phase of infection. The lowest concentration of SARS-CoV-2 viral copies this assay can detect is 138 copies/mL. A negative result does not preclude SARS-Cov-2 infection and should not be used as the sole basis for treatment or other patient management decisions. A negative result may occur with  improper specimen collection/handling, submission of specimen other than nasopharyngeal swab, presence of viral mutation(s) within the areas targeted by this assay, and inadequate number of viral copies(<138 copies/mL). A negative result must be combined with clinical observations, patient history, and epidemiological information. The expected result is  Negative.  Fact Sheet for Patients:  BloggerCourse.com  Fact Sheet for Healthcare Providers:  SeriousBroker.it  This test is no t yet approved or cleared by the [place] FDA and  has been authorized for detection and/or diagnosis of SARS-CoV-2 by FDA under an Emergency Use Authorization (EUA). This EUA will remain  in effect (meaning this test can be used) for the duration of the COVID-19 declaration under Section 564(b)(1) of the Act, 21 U.S.C.section 360bbb-3(b)(1), unless the authorization is terminated  or revoked sooner.       Influenza A by PCR NEGATIVE NEGATIVE Final   Influenza B by PCR NEGATIVE NEGATIVE Final    Comment: (NOTE) The Xpert Xpress SARS-CoV-2/FLU/RSV plus assay is intended as an aid in the diagnosis of influenza from Nasopharyngeal swab specimens and should not be used as a sole basis for treatment. Nasal washings and aspirates are unacceptable for Xpert Xpress SARS-CoV-2/FLU/RSV testing.  Fact Sheet for Patients: BloggerCourse.com  Fact Sheet for Healthcare Providers: SeriousBroker.it  This test is not yet approved or cleared by the [place] FDA and has been authorized for detection and/or diagnosis of SARS-CoV-2 by FDA under an Emergency Use Authorization (EUA). This EUA will remain in effect (meaning this test can be used) for the duration of the COVID-19 declaration under Section 564(b)(1) of the Act, 21 U.S.C. section 360bbb-3(b)(1), unless the authorization is terminated or revoked.  Performed at [facility], [address] [place], [place] [address]   Surgical PCR screen     Status: None   Collection Time: 11/16/20 10:52 AM   Specimen: Nasal Mucosa; Nasal Swab  Result Value Ref Range Status   MRSA, PCR NEGATIVE NEGATIVE Final   Staphylococcus aureus NEGATIVE NEGATIVE Final    Comment: (NOTE) The Xpert SA Assay (FDA approved  for NASAL specimens in patients 23 years of age and older), is one component of a comprehensive surveillance program. It is not intended to diagnose infection nor to guide or monitor treatment. Performed at [facility], [address], [place], [place] [address]   Culture, Urine     Status: Abnormal   Collection Time: 11/21/20  5:55 PM   Specimen: Urine, Random  Result Value Ref Range Status   Specimen Description URINE, RANDOM  Final   Special Requests NONE  Final   Culture (A)  Final    <10,000 COLONIES/mL INSIGNIFICANT GROWTH Performed at [facility], [address], [place], [place] [address]    Report Status 11/23/2020 FINAL  Final  Culture, blood (routine x 2)     Status: None (Preliminary result)   Collection Time: 11/23/20  4:10 PM   Specimen: BLOOD RIGHT HAND  Result Value Ref Range Status   Specimen Description BLOOD RIGHT HAND  Final   Special Requests   Final    BOTTLES DRAWN AEROBIC AND ANAEROBIC Blood Culture adequate volume  Culture   Final    NO GROWTH < 24 HOURS Performed at [facility], [address], [place], [place] [address]    Report Status PENDING  Incomplete  Culture, blood (routine x 2)     Status: None (Preliminary result)   Collection Time: 11/23/20  4:10 PM   Specimen: BLOOD LEFT HAND  Result Value Ref Range Status   Specimen Description BLOOD LEFT HAND  Final   Special Requests   Final    BOTTLES DRAWN AEROBIC AND ANAEROBIC Blood Culture adequate volume   Culture   Final    NO GROWTH < 24 HOURS Performed at [facility], [address] [place], [place] [address]    Report Status PENDING  Incomplete  Resp Panel by RT-PCR (Flu A&B, Covid) Nasopharyngeal Swab     Status: None   Collection Time: 11/23/20  6:10 PM   Specimen: Nasopharyngeal Swab; Nasopharyngeal(NP) swabs in vial transport medium  Result Value Ref Range Status   SARS Coronavirus 2 by RT PCR NEGATIVE NEGATIVE Final    Comment: (NOTE) SARS-CoV-2 target  nucleic acids are NOT DETECTED.  The SARS-CoV-2 RNA is generally detectable in upper respiratory specimens during the acute phase of infection. The lowest concentration of SARS-CoV-2 viral copies this assay can detect is 138 copies/mL. A negative result does not preclude SARS-Cov-2 infection and should not be used as the sole basis for treatment or other patient management decisions. A negative result may occur with  improper specimen collection/handling, submission of specimen other than nasopharyngeal swab, presence of viral mutation(s) within the areas targeted by this assay, and inadequate number of viral copies(<138 copies/mL). A negative result must be combined with clinical observations, patient history, and epidemiological information. The expected result is Negative.  Fact Sheet for Patients:  BloggerCourse.com  Fact Sheet for Healthcare Providers:  SeriousBroker.it  This test is no t yet approved or cleared by the [place] FDA and  has been authorized for detection and/or diagnosis of SARS-CoV-2 by FDA under an Emergency Use Authorization (EUA). This EUA will remain  in effect (meaning this test can be used) for the duration of the COVID-19 declaration under Section 564(b)(1) of the Act, 21 U.S.C.section 360bbb-3(b)(1), unless the authorization is terminated  or revoked sooner.       Influenza A by PCR NEGATIVE NEGATIVE Final   Influenza B by PCR NEGATIVE NEGATIVE Final    Comment: (NOTE) The Xpert Xpress SARS-CoV-2/FLU/RSV plus assay is intended as an aid in the diagnosis of influenza from Nasopharyngeal swab specimens and should not be used as a sole basis for treatment. Nasal washings and aspirates are unacceptable for Xpert Xpress SARS-CoV-2/FLU/RSV testing.  Fact Sheet for Patients: BloggerCourse.com  Fact Sheet for Healthcare  Providers: SeriousBroker.it  This test is not yet approved or cleared by the [place] FDA and has been authorized for detection and/or diagnosis of SARS-CoV-2 by FDA under an Emergency Use Authorization (EUA). This EUA will remain in effect (meaning this test can be used) for the duration of the COVID-19 declaration under Section 564(b)(1) of the Act, 21 U.S.C. section 360bbb-3(b)(1), unless the authorization is terminated or revoked.  Performed at [facility], [address], [place], [place] [address]      Labs: Basic Metabolic Panel: Recent Labs  Lab 11/20/20 0202 11/21/20 0222 11/22/20 0145 11/23/20 0730 11/24/20 0244  NA 134* 138 137 136 135  K 3.7 3.8 3.6 4.2 3.8  CL 102 103 107 106 105  CO2 GLUCOSE 130* 118* 109* 115* 107*  BUN 16 24* 26* 27* 21  CREATININE 0.62 0.66 0.72 0.76 0.68  CALCIUM 8.9 8.9 8.8* 8.6* 8.6*  MG 2.0 1.9 1.9 1.9 1.9  PHOS 3.4 2.7 2.7 2.7 2.6   Liver Function Tests: Recent Labs  Lab 11/20/20 0202 11/21/20 0222 11/22/20 0145 11/23/20 0730 11/24/20 0244  AST 32 32 ALT ALKPHOS 41 44 41 41 56  BILITOT 1.7* 1.4* 1.7* 1.4* 1.3*  PROT 5.5* 5.2* 4.9* 4.9* 5.1*  ALBUMIN 2.8* 2.6* 2.5* 2.5* 2.7*   CBC: Recent Labs  Lab 11/20/20 0202 11/21/20 0222 11/22/20 0145 11/23/20 0730 11/23/20 1834 11/24/20 0244  WBC 15.8* 15.0* 14.3* 16.8* 19.1* 19.3*  NEUTROABS 12.1* 11.6* 11.2* 13.6*  --  14.6*  HGB 8.1* 8.3* 7.9* 7.8* 8.9* 8.9*  HCT 24.4* 25.1* 24.6* 24.8* 28.0* 27.4*  MCV 92.4 93.7 95.0 95.8 96.6 95.1  PLT 323 373 432* 482* 586* 547*   CBG: No results for input(s): GLUCAP in the last 168 hours. Hgb A1c No results for input(s): HGBA1C in the last 72 hours. Lipid Profile No results for input(s): CHOL, HDL, LDLCALC, TRIG, CHOLHDL, LDLDIRECT in the last 72 hours. Thyroid function studies No results for input(s): TSH, T4TOTAL, T3FREE, THYROIDAB in the last 72  hours.  Invalid input(s): FREET3 Urinalysis    Component Value Date/Time   COLORURINE AMBER (A) 11/22/2020 1615   APPEARANCEUR CLEAR 11/22/2020 1615   LABSPEC 1.020 11/22/2020 1615   PHURINE 5.0 11/22/2020 1615   GLUCOSEU NEGATIVE 11/22/2020 1615   HGBUR NEGATIVE 11/22/2020 1615   BILIRUBINUR NEGATIVE 11/22/2020 1615   BILIRUBINUR neg 05/27/2014 1050   KETONESUR NEGATIVE 11/22/2020 1615   PROTEINUR NEGATIVE 11/22/2020 1615   UROBILINOGEN 0.2 05/27/2014 1050   UROBILINOGEN 0.2 12/30/2009 1211   NITRITE NEGATIVE 11/22/2020 1615   LEUKOCYTESUR NEGATIVE 11/22/2020 1615    FURTHER DISCHARGE INSTRUCTIONS:   Get Medicines reviewed and adjusted: Please take all your medications with you for your next visit with your Primary MD   Laboratory/radiological data: Please request your Primary MD to go over all hospital tests and procedure/radiological results at the follow up, please ask your Primary MD to get all Hospital records sent to his/her office.   In some cases, they will be blood work, cultures and biopsy results pending at the time of your discharge. Please request that your primary care M.D. goes through all the records of your hospital data and follows up on these results.   Also Note the following: If you experience worsening of your admission symptoms, develop shortness of breath, life threatening emergency, suicidal or homicidal thoughts you must seek medical attention immediately by calling 911 or calling your MD immediately  if symptoms less severe.   You must read complete instructions/literature along with all the possible adverse reactions/side effects for all the Medicines you take and that have been prescribed to you. Take any new Medicines after you have completely understood and accpet all the possible adverse reactions/side effects.    Do not drive when taking Pain medications or sleeping medications (Benzodaizepines)   Do not take more than prescribed Pain, Sleep  and Anxiety Medications. It is not advisable to combine anxiety,sleep and pain medications without talking with your primary care practitioner   Special Instructions: If you have smoked or chewed Tobacco  in the last 2 yrs please stop smoking, stop any regular Alcohol  and  or any Recreational drug use.   Wear Seat belts while driving.   Please note: You were cared for by a hospitalist during your hospital stay. Once you are discharged, your primary care physician will handle any further medical issues. Please note that NO REFILLS for any discharge medications will be authorized once you are discharged, as it is imperative that you return to your primary care physician (or establish a relationship with a primary care physician if you do not have one) for your post hospital discharge needs so that they can reassess your need for medications and monitor your lab values.  Time coordinating discharge: 40 minutes  SIGNED:  Pamella Pert, MD, PhD 11/24/2020, 9:20 AM

## 2020-11-24 NOTE — Progress Notes (Signed)
Report was called to RN at [facility]. IV was removed with catheter intact. [facility] has already been called, waiting on transport. Family at bedside and aware.

## 2020-11-25 DIAGNOSIS — E039 Hypothyroidism, unspecified: Secondary | ICD-10-CM | POA: Diagnosis not present

## 2020-11-25 DIAGNOSIS — S7291XA Unspecified fracture of right femur, initial encounter for closed fracture: Secondary | ICD-10-CM | POA: Diagnosis not present

## 2020-11-25 DIAGNOSIS — D6869 Other thrombophilia: Secondary | ICD-10-CM | POA: Diagnosis not present

## 2020-11-25 DIAGNOSIS — I4891 Unspecified atrial fibrillation: Secondary | ICD-10-CM | POA: Diagnosis not present

## 2020-11-28 LAB — CULTURE, BLOOD (ROUTINE X 2)
Culture: NO GROWTH
Culture: NO GROWTH
Special Requests: ADEQUATE
Special Requests: ADEQUATE

## 2020-11-29 DIAGNOSIS — R41841 Cognitive communication deficit: Secondary | ICD-10-CM | POA: Insufficient documentation

## 2020-12-01 DIAGNOSIS — S91301A Unspecified open wound, right foot, initial encounter: Secondary | ICD-10-CM | POA: Diagnosis not present

## 2020-12-08 DIAGNOSIS — S91301D Unspecified open wound, right foot, subsequent encounter: Secondary | ICD-10-CM | POA: Diagnosis not present

## 2020-12-15 DIAGNOSIS — S91301D Unspecified open wound, right foot, subsequent encounter: Secondary | ICD-10-CM | POA: Diagnosis not present

## 2020-12-22 DIAGNOSIS — S91301D Unspecified open wound, right foot, subsequent encounter: Secondary | ICD-10-CM | POA: Diagnosis not present

## 2021-02-01 DIAGNOSIS — S72301D Unspecified fracture of shaft of right femur, subsequent encounter for closed fracture with routine healing: Secondary | ICD-10-CM | POA: Diagnosis not present

## 2021-02-03 DIAGNOSIS — L89609 Pressure ulcer of unspecified heel, unspecified stage: Secondary | ICD-10-CM | POA: Insufficient documentation

## 2021-02-17 DIAGNOSIS — Z23 Encounter for immunization: Secondary | ICD-10-CM | POA: Diagnosis not present

## 2021-03-15 DIAGNOSIS — Z23 Encounter for immunization: Secondary | ICD-10-CM | POA: Diagnosis not present

## 2021-03-24 DIAGNOSIS — M25552 Pain in left hip: Secondary | ICD-10-CM | POA: Diagnosis not present

## 2021-03-24 DIAGNOSIS — M25562 Pain in left knee: Secondary | ICD-10-CM | POA: Diagnosis not present

## 2021-04-05 ENCOUNTER — Ambulatory Visit (HOSPITAL_COMMUNITY): Payer: Medicare Other | Admitting: Nurse Practitioner

## 2021-04-09 ENCOUNTER — Other Ambulatory Visit (HOSPITAL_COMMUNITY): Payer: Self-pay | Admitting: Nurse Practitioner

## 2021-04-09 DIAGNOSIS — I4821 Permanent atrial fibrillation: Secondary | ICD-10-CM

## 2021-04-12 DIAGNOSIS — S72301D Unspecified fracture of shaft of right femur, subsequent encounter for closed fracture with routine healing: Secondary | ICD-10-CM | POA: Diagnosis not present

## 2021-04-19 ENCOUNTER — Other Ambulatory Visit: Payer: Self-pay

## 2021-04-19 ENCOUNTER — Ambulatory Visit (HOSPITAL_COMMUNITY)
Admission: RE | Admit: 2021-04-19 | Discharge: 2021-04-19 | Disposition: A | Discharge status: Home / Self Care | Payer: Medicare Other | Source: Ambulatory Visit | Attending: Nurse Practitioner | Admitting: Nurse Practitioner

## 2021-04-19 VITALS — BP 110/54 | HR 86 | Ht 62.0 in | Wt 144.8 lb

## 2021-04-19 DIAGNOSIS — E039 Hypothyroidism, unspecified: Secondary | ICD-10-CM | POA: Diagnosis not present

## 2021-04-19 DIAGNOSIS — I4811 Longstanding persistent atrial fibrillation: Secondary | ICD-10-CM | POA: Insufficient documentation

## 2021-04-19 DIAGNOSIS — Z09 Encounter for follow-up examination after completed treatment for conditions other than malignant neoplasm: Secondary | ICD-10-CM | POA: Diagnosis not present

## 2021-04-19 DIAGNOSIS — Z7901 Long term (current) use of anticoagulants: Secondary | ICD-10-CM | POA: Diagnosis not present

## 2021-04-19 DIAGNOSIS — D6869 Other thrombophilia: Secondary | ICD-10-CM

## 2021-04-19 DIAGNOSIS — Z79899 Other long term (current) drug therapy: Secondary | ICD-10-CM | POA: Insufficient documentation

## 2021-04-19 DIAGNOSIS — Z87891 Personal history of nicotine dependence: Secondary | ICD-10-CM | POA: Insufficient documentation

## 2021-04-19 MED ORDER — SYNTHROID 125 MCG PO TABS: 125.0000 ug | ORAL_TABLET | Freq: Every day | ORAL | 1 refills | Status: DC

## 2021-04-19 NOTE — Progress Notes (Signed)
Patient ID: Rilyn Scroggs, female   DOB: Feb 01, 1926, 87 y.o.   MRN: [id number]       Primary Care Physician: System, Provider Not In Referring Physician: Dr. Laqueta Jean Dede is a 87 y.o. female with a h/o afib that was loaded on amiodarone and successfully cardioverted 4/13, but with ERAF. She did not see that much difference in SR and had some thyroid concerns  already on replacement, so decision was made to stop amiodarone and aim for rate control,HR has been staying below 100 at home. Her energy is good for her age and she is back to hanging clothes outside. No issues with anticoagulant.   Returns to afib clinic 8/23, after she has noticed at home some fluctuation in heart rate. Her daughter on taking her heart rate and Bp one day, noticed that hr heart rate was 48 bpm, although the pt did not feel bad. She talked to the on call provider and was advised to skip the next rate control drug. A few days later, the pt had a nonsustained episode of rvr at 150 bpm. She did feel lightheaded with that episode. On call provider was contacted and she was told to how to take extra rate control if needed. However after a few more minutes, her HR slowed down.  EKG showed afib with controlled afib. She is also upset because she feels that she needs more thyroid replacement. She has also felt better on a higher dose than what the labs suggest that she takes. Her synthroid was reduced several months back and the pt is c/o thinning hair, thinning nails, more fatigue, depression. She is planning to change to a new MD in the first of October to see if she can get straightened out with her thyroid.   Returns 9/6, holter monitor placed on previous visit and reviewed. Shows average HR around 107 bpm, high rate 160 bpm, minimum 40 bpm at 4:30 am. Pt was not aware of fast or slow heart rate.She could benefit from more rate control. She is still able to do her house work with minimal symptoms and feels that she has good  rate control.   F/u in the afib clinic 10/25 and has good rate control. Currently is on metoprolol ER 25 mg bid and cardizem 120 mg bid. Tried higher doses of BB but could not tolerate. She is tolerating this combination of rate control with HR in the 80's.   F/u in afib clinic,4/25, she is feeling well. Does not feel afib . No issues with shortness of breath or pedal edema. No bleeding issues with eliquis.  F/u in fib clinic, 09/20/17. She has now turned 89 and is doing well. She did lacerate her leg on a patio chair about 6 weeks  Ago and did need stitches. It is healing but a large dark scab is still present. Otherwise, no complaints.  F/u in afib clinic, 03/28/18. She is doing very well. No complaints. It took the area on her leg described above, 6 months to heal and eventually this was done with the help of the would clinic. She is now 89 and going strong. Her renal panel for June showed a creatinine of 0.84 and with her current weight, she still qualifies for 5 mg eliquis bid. No bleeding issues.  F/u in afib clinic, 03/25/19. She continues in rate controlled afib.She did have a fall at home  and went to the ER with bruising and a couple of stitches.No major trauma. She  is now walking with a cane in the house and has a fall alert bracelet now. Does not know why she fell, just  got off balance.    F/u 09/23/19 in afib clinic. She feels well. Relieved to have has her covid shots so she can be more active. She and her daughter went to the beach in April and she really enjoyed the change in scenery. Her afib is rate controlled and she does not feel it. She  had a major fall last fall, mostly bruising, but no further reports of fall. Walks with a cane.     F/u in afib clinic, 03/22/20. Overall no  issues with afib, she is rate controlled   at home in the 80's/90's. Her biggest concern is that she  feels she needs at least 125 mcg levothyroxine to feel her best. This  has been her dose for years and when she  tries to reduce it, she feels terrible. She states that Dr. Carlota Raspberry  before he retired would listen to her and her reason for the higher dose, not go strictly by lab values,  but her last few  physician's want to go strictly by the TSH. I explained to her that the lab  values are what is used to adjust meds but she feels it should be how she feels. She was recommended by Dr. Mariea Clonts to reduce to 112 mcg daily but she is taking an higher dose than  this, having older med doses  around the house, I believe she indicated she was taking 125 mcg daily.  F/u in afib clinic, 09/30/20. She remains in permanent afib with controlled ventricular rate. No recent falls or other complaints today. Her weight has declined but still above 60 kgs (61.8 kgs) and creatinine in less than 1.5, so  she will continue on eliquis 5 mg bid. No bleeding issues.   F/u in afib clinic, 04/19/21. She remains in rate controlled permament afib. Since last visit, she had a mechanical fall and had surgery for Rt  femur fracture. She had surgery without any complications. She is planing a new PCP visit in December.   Today, she denies symptoms of palpitations, chest pain, shortness of breath, orthopnea, PND, lower extremity edema, dizziness, presyncope, syncope, or neurologic sequela. The patient is tolerating medications without difficulties and is otherwise without complaint today.   Past Medical History:  Diagnosis Date   A-fib ([place]) 05/2015   HOSPITALIZED    Arthritis    Cataract    Hypothyroidism    Permanent atrial fibrillation ([place])    Thyroid disease    Past Surgical History:  Procedure Laterality Date   CARDIOVERSION N/A 06/08/2015   Procedure: CARDIOVERSION;  Surgeon: Jerline Pain, MD;  Location: [facility] ENDOSCOPY;  Service: Cardiovascular;  Laterality: N/A;   CARDIOVERSION N/A 09/02/2015   Procedure: CARDIOVERSION;  Surgeon: Herminio Commons, MD;  Location: [facility];  Service: Cardiovascular;  Laterality: N/A;   CATARACT  EXTRACTION, BILATERAL Bilateral 2010   JOINT REPLACEMENT     KNEE SURGERY  2002   ORIF FEMUR FRACTURE Right 11/18/2020   Procedure: OPEN REDUCTION INTERNAL FIXATION (ORIF) DISTAL FEMUR FRACTURE;  Surgeon: Shona Needles, MD;  Location: [facility];  Service: Orthopedics;  Laterality: Right;   right hip replacement     RIGHT KNEE REPLACEMENT  2011   TEE WITHOUT CARDIOVERSION N/A 06/08/2015   Procedure: TRANSESOPHAGEAL ECHOCARDIOGRAM (TEE);  Surgeon: Jerline Pain, MD;  Location: [facility];  Service: Cardiovascular;  Laterality: N/A;  TOTAL HIP ARTHROPLASTY Right 2011   TOTAL SHOULDER REPLACEMENT Right 2003   TOTAL SHOULDER REPLACEMENT Left 2007   WRIST SURGERY Bilateral 2008   both wrists    Current Outpatient Medications  Medication Sig Dispense Refill   apixaban (ELIQUIS) 5 MG TABS tablet Take 1 tablet (5 mg total) by mouth 2 (two) times daily. 180 tablet 3   CARDIZEM LA 120 MG 24 hr tablet TAKE 1 TABLET (120 MG TOTAL) BY MOUTH 2 (TWO) TIMES DAILY. (Patient taking differently: Take 120 mg by mouth in the morning and at bedtime.) 180 tablet 1   docusate sodium (COLACE) 100 MG capsule Take 1 capsule (100 mg total) by mouth daily as needed for mild constipation. 10 capsule 0   furosemide (LASIX) 20 MG tablet TAKE 1 TABLET DAILY AS NEEDED FOR WEIGHT GAIN >3LBS. (Patient taking differently: Take 20 mg by mouth as needed. Weight gain > 3 pounds) 30 tablet 2   levothyroxine (SYNTHROID) 100 MCG tablet Take 1 tablet (100 mcg total) by mouth daily at 6 (six) AM.     metoprolol succinate (TOPROL-XL) 25 MG 24 hr tablet TAKE 1 TABLET BY MOUTH TWICE A DAY 180 tablet 1   mirtazapine (REMERON) 7.5 MG tablet Take 1 tablet (7.5 mg total) by mouth at bedtime. 90 tablet 5   traMADol (ULTRAM) 50 MG tablet Take 1 tablet (50 mg total) by mouth 2 (two) times daily as needed for moderate pain or severe pain. 14 tablet 0   Triprolidine-Pseudoephedrine (ANTIHISTAMINE PO) Take 1 tablet by mouth daily as needed (runny  nose).     No current facility-administered medications for this encounter.    Allergies  Allergen Reactions   Banana Nausea Only    All banana products also   Penicillins Hives    Social History   Socioeconomic History   Marital status: Widowed    Spouse name: Not on file   Number of children: Not on file   Years of education: Not on file   Highest education level: Not on file  Occupational History   Not on file  Tobacco Use   Smoking status: Former    Packs/day: 1.00    Years: 65.00    Pack years: 65.00    Types: Cigarettes    Quit date: 05/28/2011    Years since quitting: 9.9   Smokeless tobacco: Never  Vaping Use   Vaping Use: Former  Substance and Sexual Activity   Alcohol use: Yes    Alcohol/week: 1.0 - 2.0 standard drink    Types: 1 - 2 Glasses of wine per week    Comment: occasional drink   Drug use: No   Sexual activity: Not on file  Other Topics Concern   Not on file  Social History Narrative   DIET: NONE      DO YOU DRINK/EAT THINGS WITH CAFFEINE: YES, COFFEE AND TEA       MARITAL STATUS: WIDOWED      WHAT YEAR WERE YOU MARRIED:1950      DO YOU LIVE IN A HOUSE, APARTMENT, ASSISTED LIVING, CONDO TRAILER ETC.: HOUSE       IS IT ONE OR MORE STORIES: 2 STORIES      HOW MANY PERSONS LIVE IN YOUR HOME: 1      DO YOU HAVE PETS IN YOUR HOME: 1 CAT AND A PART TIME DOG       CURRENT OR PAST PROFESSION: CIVIL SERVANT      DO YOU EXERCISE: NO  WHAT TYPE AND HOW OFTEN:NO   Social Determinants of Health   Financial Resource Strain: Not on file  Food Insecurity: Not on file  Transportation Needs: Not on file  Physical Activity: Not on file  Stress: Not on file  Social Connections: Not on file  Intimate Partner Violence: Not on file    Family History  Problem Relation Age of Onset   Hypercalcemia Daughter    Hypertension Daughter     ROS- All systems are reviewed and negative except as per the HPI above  Physical Exam: There were no  vitals filed for this visit.  GEN- The patient is well appearing, alert and oriented x 3 today.   Head- normocephalic, atraumatic Eyes-  Sclera clear, conjunctiva pink Ears- hearing intact Oropharynx- clear Neck- supple, no JVP Lymph- no cervical lymphadenopathy Lungs- Clear to ausculation bilaterally, normal work of breathing Heart- Irregular rate and rhythm, no murmurs, rubs or gallops, PMI not laterally displaced GI- soft, NT, ND, + BS Extremities- no clubbing, cyanosis, or edema MS- no significant deformity or atrophy Skin- no rash or lesion Psych- euthymic mood, full affect Neuro- strength and sensation are intact  EKG-afib at 86 bpm, qrs int 84 ms, qtc 430 ms Epic records reviewed   Assessment and Plan: 1.Long standing permanent afib Rate controlled Appears to be doing well Recuperated form femur fx surgery 11/15/20 Continue metoprolol ER 25 mg bid and cardizem 120 mg bid, appears to have HR well controlled with these doses Continue eliquis 5 mg bid, appropriately dosed at this time  Has appointment for physical with new PCP December 20th. She will run out of her thyroid med before then so will call in 30 day supply.   2. Hypothyroidism  Has appointment for physical with new PCP December 20th. She will run out of her thyroid med before then so will call in 30 day supply.   F/u 6 months  Geroge Baseman. [facility] [place], [facility] [address] [place], [place] [address] [phone]

## 2021-05-10 ENCOUNTER — Other Ambulatory Visit: Payer: Self-pay

## 2021-05-10 ENCOUNTER — Ambulatory Visit (INDEPENDENT_AMBULATORY_CARE_PROVIDER_SITE_OTHER): Payer: Medicare Other | Admitting: Internal Medicine

## 2021-05-10 ENCOUNTER — Encounter: Payer: Self-pay | Admitting: Internal Medicine

## 2021-05-10 VITALS — BP 120/72 | HR 99 | Temp 98.3°F | Resp 16 | Ht 62.0 in

## 2021-05-10 DIAGNOSIS — I4891 Unspecified atrial fibrillation: Secondary | ICD-10-CM | POA: Diagnosis not present

## 2021-05-10 DIAGNOSIS — D539 Nutritional anemia, unspecified: Secondary | ICD-10-CM | POA: Insufficient documentation

## 2021-05-10 DIAGNOSIS — I83018 Varicose veins of right lower extremity with ulcer other part of lower leg: Secondary | ICD-10-CM

## 2021-05-10 DIAGNOSIS — R600 Localized edema: Secondary | ICD-10-CM

## 2021-05-10 DIAGNOSIS — E034 Atrophy of thyroid (acquired): Secondary | ICD-10-CM

## 2021-05-10 DIAGNOSIS — L97811 Non-pressure chronic ulcer of other part of right lower leg limited to breakdown of skin: Secondary | ICD-10-CM

## 2021-05-10 LAB — CBC WITH DIFFERENTIAL/PLATELET
Basophils Absolute: 0.1 10*3/uL (ref 0.0–0.1)
Basophils Relative: 1.1 % (ref 0.0–3.0)
Eosinophils Absolute: 0.1 10*3/uL (ref 0.0–0.7)
Eosinophils Relative: 1.4 % (ref 0.0–5.0)
HCT: 36.7 % (ref 36.0–46.0)
Hemoglobin: 11.9 g/dL — ABNORMAL LOW (ref 12.0–15.0)
Lymphocytes Relative: 12.5 % (ref 12.0–46.0)
Lymphs Abs: 0.9 10*3/uL (ref 0.7–4.0)
MCHC: 32.4 g/dL (ref 30.0–36.0)
MCV: 89.7 fl (ref 78.0–100.0)
Monocytes Absolute: 0.8 10*3/uL (ref 0.1–1.0)
Monocytes Relative: 10.5 % (ref 3.0–12.0)
Neutro Abs: 5.3 10*3/uL (ref 1.4–7.7)
Neutrophils Relative %: 74.5 % (ref 43.0–77.0)
Platelets: 321 10*3/uL (ref 150.0–400.0)
RBC: 4.09 Mil/uL (ref 3.87–5.11)
RDW: 17.5 % — ABNORMAL HIGH (ref 11.5–15.5)
WBC: 7.1 10*3/uL (ref 4.0–10.5)

## 2021-05-10 LAB — HEPATIC FUNCTION PANEL
ALT: 9 U/L (ref 0–35)
AST: 17 U/L (ref 0–37)
Albumin: 3.8 g/dL (ref 3.5–5.2)
Alkaline Phosphatase: 103 U/L (ref 39–117)
Bilirubin, Direct: 0.1 mg/dL (ref 0.0–0.3)
Total Bilirubin: 0.5 mg/dL (ref 0.2–1.2)
Total Protein: 6.6 g/dL (ref 6.0–8.3)

## 2021-05-10 LAB — IBC + FERRITIN
Ferritin: 41.2 ng/mL (ref 10.0–291.0)
Iron: 49 ug/dL (ref 42–145)
Saturation Ratios: 13.6 % — ABNORMAL LOW (ref 20.0–50.0)
TIBC: 361.2 ug/dL (ref 250.0–450.0)
Transferrin: 258 mg/dL (ref 212.0–360.0)

## 2021-05-10 LAB — BASIC METABOLIC PANEL
BUN: 18 mg/dL (ref 6–23)
CO2: 29 mEq/L (ref 19–32)
Calcium: 9.6 mg/dL (ref 8.4–10.5)
Chloride: 103 mEq/L (ref 96–112)
Creatinine, Ser: 0.69 mg/dL (ref 0.40–1.20)
GFR: 74.63 mL/min (ref >60.00)
Glucose, Bld: 113 mg/dL — ABNORMAL HIGH (ref 70–99)
Potassium: 4.2 mEq/L (ref 3.5–5.1)
Sodium: 137 mEq/L (ref 135–145)

## 2021-05-10 LAB — VITAMIN B12: Vitamin B-12: 297 pg/mL (ref 211–911)

## 2021-05-10 LAB — TSH: TSH: 2.14 u[IU]/mL (ref 0.35–5.50)

## 2021-05-10 LAB — FOLATE: Folate: 8.5 ng/mL (ref >5.9)

## 2021-05-10 MED ORDER — SYNTHROID 125 MCG PO TABS: 125.0000 ug | ORAL_TABLET | Freq: Every day | ORAL | 1 refills | Status: DC

## 2021-05-10 MED ORDER — TORSEMIDE 20 MG PO TABS: 20.0000 mg | ORAL_TABLET | Freq: Every day | ORAL | 0 refills | Status: DC

## 2021-05-10 NOTE — Patient Instructions (Signed)
Atrial Fibrillation  Atrial fibrillation is a type of irregular or rapid heartbeat (arrhythmia). In atrial fibrillation, the top part of the heart (atria) beats in an irregular pattern. This makes the heart unable to pump bloodnormally and effectively. The goal of treatment is to prevent blood clots from forming, control your heart rate, or restore your heartbeat to a normal rhythm. If this condition is not treated, it can cause serious problems, such as a weakened heart muscle (cardiomyopathy) or a stroke. What are the causes? This condition is often caused by medical conditions that damage the heart's electrical system. These include: High blood pressure (hypertension). This is the most common cause. Certain heart problems or conditions, such as heart failure, coronary artery disease, heart valve problems, or heart surgery. Diabetes. Overactive thyroid (hyperthyroidism). Obesity. Chronic kidney disease. In some cases, the cause of this condition is not known. What increases the risk? This condition is more likely to develop in: Older people. People who smoke. Athletes who do endurance exercise. People who have a family history of atrial fibrillation. Men. People who use drugs. People who drink a lot of alcohol. People who have lung conditions, such as emphysema, pneumonia, or COPD. People who have obstructive sleep apnea. What are the signs or symptoms? Symptoms of this condition include: A feeling that your heart is racing or beating irregularly. Discomfort or pain in your chest. Shortness of breath. Sudden light-headedness or weakness. Tiring easily during exercise or activity. Fatigue. Syncope (fainting). Sweating. In some cases, there are no symptoms. How is this diagnosed? Your health care provider may detect atrial fibrillation when taking your pulse. If detected, this condition may be diagnosed with: An electrocardiogram (ECG) to check electrical signals of the  heart. An ambulatory cardiac monitor to record your heart's activity for a few days. A transthoracic echocardiogram (TTE) to create pictures of your heart. A transesophageal echocardiogram (TEE) to create even closer pictures of your heart. A stress test to check your blood supply while you exercise. Imaging tests, such as a CT scan or chest X-ray. Blood tests. How is this treated? Treatment depends on underlying conditions and how you feel when you experience atrial fibrillation. This condition may be treated with: Medicines to prevent blood clots or to treat heart rate or heart rhythm problems. Electrical cardioversion to reset the heart's rhythm. A pacemaker to correct abnormal heart rhythm. Ablation to remove the heart tissue that sends abnormal signals. Left atrial appendage closure to seal the area where blood clots can form. In some cases, underlying conditions will be treated. Follow these instructions at home: Medicines Take over-the counter and prescription medicines only as told by your health care provider. Do not take any new medicines without talking to your health care provider. If you are taking blood thinners: Talk with your health care provider before you take any medicines that contain aspirin or NSAIDs, such as ibuprofen. These medicines increase your risk for dangerous bleeding. Take your medicine exactly as told, at the same time every day. Avoid activities that could cause injury or bruising, and follow instructions about how to prevent falls. Wear a medical alert bracelet or carry a card that lists what medicines you take. Lifestyle     Do not use any products that contain nicotine or tobacco, such as cigarettes, e-cigarettes, and chewing tobacco. If you need help quitting, ask your health care provider. Eat heart-healthy foods. Talk with a dietitian to make an eating plan that is right for you. Exercise regularly as told by   your health care provider. Do not  drink alcohol. Lose weight if you are overweight. Do not use drugs, including cannabis. General instructions If you have obstructive sleep apnea, manage your condition as told by your health care provider. Do not use diet pills unless your health care provider approves. Diet pills can make heart problems worse. Keep all follow-up visits as told by your health care provider. This is important. Contact a health care provider if you: Notice a change in the rate, rhythm, or strength of your heartbeat. Are taking a blood thinner and you notice more bruising. Tire more easily when you exercise or do heavy work. Have a sudden change in weight. Get help right away if you have:  Chest pain, abdominal pain, sweating, or weakness. Trouble breathing. Side effects of blood thinners, such as blood in your vomit, stool, or urine, or bleeding that cannot stop. Any symptoms of a stroke. "BE FAST" is an easy way to remember the main warning signs of a stroke: B - Balance. Signs are dizziness, sudden trouble walking, or loss of balance. E - Eyes. Signs are trouble seeing or a sudden change in vision. F - Face. Signs are sudden weakness or numbness of the face, or the face or eyelid drooping on one side. A - Arms. Signs are weakness or numbness in an arm. This happens suddenly and usually on one side of the body. S - Speech. Signs are sudden trouble speaking, slurred speech, or trouble understanding what people say. T - Time. Time to call emergency services. Write down what time symptoms started. Other signs of a stroke, such as: A sudden, severe headache with no known cause. Nausea or vomiting. Seizure. These symptoms may represent a serious problem that is an emergency. Do not wait to see if the symptoms will go away. Get medical help right away. Call your local emergency services (911 in the [place] Do not drive yourself to the hospital. Summary Atrial fibrillation is a type of irregular or rapid  heartbeat (arrhythmia). Symptoms include a feeling that your heart is beating fast or irregularly. You may be given medicines to prevent blood clots or to treat heart rate or heart rhythm problems. Get help right away if you have signs or symptoms of a stroke. Get help right away if you cannot catch your breath or have chest pain or pressure. This information is not intended to replace advice given to you by your health care provider. Make sure you discuss any questions you have with your healthcare provider. Document Revised: 10/30/2018 Document Reviewed: 10/30/2018 Elsevier Patient Education  2022 [employer]  

## 2021-05-10 NOTE — Progress Notes (Signed)
Subjective:  Patient ID: Katrina Velez, female    DOB: [birth year]  Age: 87 y.o. MRN: [id number]  CC: Anemia and Atrial Fibrillation  This visit occurred during the SARS-CoV-2 public health emergency.  Safety protocols were in place, including screening questions prior to the visit, additional usage of staff PPE, and extensive cleaning of exam room while observing appropriate contact time as indicated for disinfecting solutions.    HPI Katrina Velez presents for f/up and to establish.  She has chronic pain from osteoarthritis.  The pain is well controlled with tramadol and Tylenol.  For the last few weeks she has had worsening lower extremity edema and an ulcer on her right lower leg.  The ulcer is slowly getting better.  She denies chest pain, shortness of breath, dizziness, lightheadedness, or diaphoresis.  She has a history of atrial fibrillation but does not experience palpitations or near syncope.  Recent labs revealed an anemia.  History Katrina Velez has a past medical history of A-fib (HCC) (05/2015), Arthritis, Cataract, Hypothyroidism, Permanent atrial fibrillation (HCC), and Thyroid disease.   She has a past surgical history that includes Knee surgery (2002); Wrist surgery (Bilateral, 2008); right hip replacement; Joint replacement; TEE without cardioversion (N/A, 06/08/2015); Cardioversion (N/A, 06/08/2015); Cardioversion (N/A, 09/02/2015); RIGHT KNEE REPLACEMENT (2011); Total shoulder replacement (Right, 2003); Total shoulder replacement (Left, 2007); Cataract extraction, bilateral (Bilateral, 2010); Total hip arthroplasty (Right, 2011); and ORIF femur fracture (Right, 11/18/2020).   Her family history includes Hypercalcemia in her daughter; Hypertension in her daughter.She reports that she quit smoking about 9 years ago. Her smoking use included cigarettes. She has a 65.00 pack-year smoking history. She has never used smokeless tobacco. She reports current alcohol use of about 1.0 - 2.0  standard drink per week. She reports that she does not use drugs.  Outpatient Medications Prior to Visit  Medication Sig Dispense Refill   apixaban (ELIQUIS) 5 MG TABS tablet Take 1 tablet (5 mg total) by mouth 2 (two) times daily. 180 tablet 3   CARDIZEM LA 120 MG 24 hr tablet TAKE 1 TABLET (120 MG TOTAL) BY MOUTH 2 (TWO) TIMES DAILY. (Patient taking differently: Take 120 mg by mouth in the morning and at bedtime.) 180 tablet 1   docusate sodium (COLACE) 100 MG capsule Take 1 capsule (100 mg total) by mouth daily as needed for mild constipation. 10 capsule 0   metoprolol succinate (TOPROL-XL) 25 MG 24 hr tablet TAKE 1 TABLET BY MOUTH TWICE A DAY 180 tablet 1   traMADol (ULTRAM) 50 MG tablet Take 1 tablet (50 mg total) by mouth 2 (two) times daily as needed for moderate pain or severe pain. 14 tablet 0   furosemide (LASIX) 20 MG tablet TAKE 1 TABLET DAILY AS NEEDED FOR WEIGHT GAIN >3LBS. (Patient taking differently: Take 20 mg by mouth as needed. Weight gain > 3 pounds) 30 tablet 2   SYNTHROID 125 MCG tablet Take 1 tablet (125 mcg total) by mouth daily. 30 tablet 1   Triprolidine-Pseudoephedrine (ANTIHISTAMINE PO) Take 1 tablet by mouth daily as needed (runny nose).     No facility-administered medications prior to visit.    ROS Review of Systems  Constitutional:  Positive for fatigue. Negative for chills, diaphoresis, fever and unexpected weight change.  HENT: Negative.    Eyes: Negative.   Respiratory:  Negative for cough, chest tightness, shortness of breath and wheezing.   Cardiovascular:  Positive for leg swelling. Negative for chest pain and palpitations.  Gastrointestinal:  Negative  for abdominal pain, blood in stool, constipation, diarrhea, nausea and vomiting.  Endocrine: Negative.  Negative for cold intolerance and heat intolerance.  Genitourinary: Negative.  Negative for difficulty urinating and dysuria.  Musculoskeletal:  Positive for arthralgias.  Skin:  Positive for wound.   Neurological:  Negative for dizziness, weakness, light-headedness and headaches.  Hematological:  Negative for adenopathy. Does not bruise/bleed easily.  Psychiatric/Behavioral: Negative.     Objective:  BP 120/72 (BP Location: Right Arm, Patient Position: Sitting, Cuff Size: Large)    Pulse 99    Temp 98.3 F (36.8 C) (Oral)    Resp 16    Ht 5\' 2"  (1.575 m)    SpO2 98%    BMI 26.48 kg/m   Physical Exam Vitals reviewed.  Constitutional:      Appearance: She is ill-appearing (in a wheelchair).  HENT:     Nose: Nose normal.     Mouth/Throat:     Mouth: Mucous membranes are moist.  Eyes:     General: No scleral icterus.    Conjunctiva/sclera: Conjunctivae normal.  Cardiovascular:     Rate and Rhythm: Tachycardia present. Rhythm irregularly irregular.     Heart sounds: Normal heart sounds, S1 normal and S2 normal. No murmur heard.   No gallop.  Pulmonary:     Effort: Pulmonary effort is normal.     Breath sounds: No stridor. No wheezing or rhonchi.  Abdominal:     General: Abdomen is flat.     Palpations: There is no mass.     Tenderness: There is no abdominal tenderness. There is no guarding.     Hernia: No hernia is present.  Musculoskeletal:     Cervical back: Neck supple.     Right lower leg: 2+ Edema present.     Left lower leg: 2+ Edema present.       Legs:  Lymphadenopathy:     Cervical: No cervical adenopathy.  Skin:    General: Skin is warm and dry.  Neurological:     Mental Status: She is alert. Mental status is at baseline.  Psychiatric:        Mood and Affect: Mood normal.        Behavior: Behavior normal.    Lab Results  Component Value Date   WBC 7.1 05/10/2021   HGB 11.9 (L) 05/10/2021   HCT 36.7 05/10/2021   PLT 321.0 05/10/2021   GLUCOSE 113 (H) 05/10/2021   CHOL 193 05/27/2014   TRIG 107 05/27/2014   HDL 66 05/27/2014   LDLCALC 106 (H) 05/27/2014   ALT 9 05/10/2021   AST 17 05/10/2021   NA 137 05/10/2021   K 4.2 05/10/2021   CL 103  05/10/2021   CREATININE 0.69 05/10/2021   BUN 18 05/10/2021   CO2 29 05/10/2021   TSH 2.14 05/10/2021   INR 1.6 (H) 11/16/2020     Assessment & Plan:   Katrina Velez was seen today for anemia and atrial fibrillation.  Diagnoses and all orders for this visit:  Bilateral leg edema- Her labs are negative for secondary causes.  I recommended that she start taking a loop diuretic. -     Basic metabolic panel; Future -     TSH; Future -     Urinalysis, Routine w reflex microscopic; Future -     Hepatic function panel; Future -     torsemide (DEMADEX) 20 MG tablet; Take 1 tablet (20 mg total) by mouth daily. -     Hepatic function panel -  Urinalysis, Routine w reflex microscopic -     TSH -     Basic metabolic panel  Atrial fibrillation with RVR (HCC)- Will continue the current dose of Cardizem. -     Basic metabolic panel; Future -     TSH; Future -     TSH -     Basic metabolic panel  Hypothyroidism due to acquired atrophy of thyroid- Her TSH is in the normal range.  She will stay on the current dose of levothyroxine. -     TSH; Future -     TSH -     SYNTHROID 125 MCG tablet; Take 1 tablet (125 mcg total) by mouth daily.  Deficiency anemia- Her H&H have improved significantly.  I will screen for vitamin deficiencies. -     CBC with Differential/Platelet; Future -     IBC + Ferritin; Future -     Folate; Future -     Vitamin B1; Future -     Vitamin B12; Future -     Vitamin B12 -     Vitamin B1 -     Folate -     IBC + Ferritin -     CBC with Differential/Platelet  Venous stasis ulcer of other part of right lower leg limited to breakdown of skin with varicose veins (HCC) -     Ambulatory referral to Wound Clinic -     Ambulatory referral to Home Health  Other orders -     Extra Specimen   I have discontinued Katrina Velez's furosemide and Triprolidine-Pseudoephedrine (ANTIHISTAMINE PO). I am also having her start on torsemide. Additionally, I am having her maintain her  docusate sodium, Cardizem LA, apixaban, traMADol, metoprolol succinate, and Synthroid.  Meds ordered this encounter  Medications   torsemide (DEMADEX) 20 MG tablet    Sig: Take 1 tablet (20 mg total) by mouth daily.    Dispense:  90 tablet    Refill:  0   SYNTHROID 125 MCG tablet    Sig: Take 1 tablet (125 mcg total) by mouth daily.    Dispense:  90 tablet    Refill:  1     Follow-up: Return in about 3 months (around 08/08/2021).  Sanda Linger, MD

## 2021-05-11 LAB — URINALYSIS, ROUTINE W REFLEX MICROSCOPIC
Bilirubin Urine: NEGATIVE
Ketones, ur: NEGATIVE
Leukocytes,Ua: NEGATIVE
Nitrite: NEGATIVE
Specific Gravity, Urine: 1.02 (ref 1.000–1.030)
Total Protein, Urine: NEGATIVE
Urine Glucose: NEGATIVE
Urobilinogen, UA: 0.2 (ref 0.0–1.0)
pH: 5.5 (ref 5.0–8.0)

## 2021-05-13 LAB — VITAMIN B1

## 2021-05-13 LAB — EXTRA SPECIMEN

## 2021-05-26 ENCOUNTER — Telehealth: Payer: Self-pay | Admitting: Internal Medicine

## 2021-05-26 DIAGNOSIS — E038 Other specified hypothyroidism: Secondary | ICD-10-CM | POA: Diagnosis not present

## 2021-05-26 DIAGNOSIS — Z7901 Long term (current) use of anticoagulants: Secondary | ICD-10-CM | POA: Diagnosis not present

## 2021-05-26 DIAGNOSIS — Z9181 History of falling: Secondary | ICD-10-CM | POA: Diagnosis not present

## 2021-05-26 DIAGNOSIS — L97811 Non-pressure chronic ulcer of other part of right lower leg limited to breakdown of skin: Secondary | ICD-10-CM | POA: Diagnosis not present

## 2021-05-26 DIAGNOSIS — Z8781 Personal history of (healed) traumatic fracture: Secondary | ICD-10-CM | POA: Diagnosis not present

## 2021-05-26 DIAGNOSIS — D649 Anemia, unspecified: Secondary | ICD-10-CM | POA: Diagnosis not present

## 2021-05-26 DIAGNOSIS — I83018 Varicose veins of right lower extremity with ulcer other part of lower leg: Secondary | ICD-10-CM | POA: Diagnosis not present

## 2021-05-26 DIAGNOSIS — Z87891 Personal history of nicotine dependence: Secondary | ICD-10-CM | POA: Diagnosis not present

## 2021-05-26 DIAGNOSIS — Z96612 Presence of left artificial shoulder joint: Secondary | ICD-10-CM | POA: Diagnosis not present

## 2021-05-26 DIAGNOSIS — I4821 Permanent atrial fibrillation: Secondary | ICD-10-CM | POA: Diagnosis not present

## 2021-05-26 DIAGNOSIS — M1990 Unspecified osteoarthritis, unspecified site: Secondary | ICD-10-CM | POA: Diagnosis not present

## 2021-05-26 DIAGNOSIS — Z96651 Presence of right artificial knee joint: Secondary | ICD-10-CM | POA: Diagnosis not present

## 2021-05-26 DIAGNOSIS — Z96611 Presence of right artificial shoulder joint: Secondary | ICD-10-CM | POA: Diagnosis not present

## 2021-05-26 DIAGNOSIS — Z96641 Presence of right artificial hip joint: Secondary | ICD-10-CM | POA: Diagnosis not present

## 2021-05-26 NOTE — Telephone Encounter (Signed)
[name] called and is requesting verbal order for 1x week- 4 weeks.  Wound on R leg has scabbed over. PT will go out to evaluate

## 2021-05-27 NOTE — Telephone Encounter (Signed)
Verbal orders given  

## 2021-05-30 DIAGNOSIS — M1990 Unspecified osteoarthritis, unspecified site: Secondary | ICD-10-CM | POA: Diagnosis not present

## 2021-05-30 DIAGNOSIS — I83018 Varicose veins of right lower extremity with ulcer other part of lower leg: Secondary | ICD-10-CM | POA: Diagnosis not present

## 2021-05-30 DIAGNOSIS — D649 Anemia, unspecified: Secondary | ICD-10-CM | POA: Diagnosis not present

## 2021-05-30 DIAGNOSIS — L97811 Non-pressure chronic ulcer of other part of right lower leg limited to breakdown of skin: Secondary | ICD-10-CM | POA: Diagnosis not present

## 2021-05-30 DIAGNOSIS — I4821 Permanent atrial fibrillation: Secondary | ICD-10-CM | POA: Diagnosis not present

## 2021-05-30 DIAGNOSIS — E038 Other specified hypothyroidism: Secondary | ICD-10-CM | POA: Diagnosis not present

## 2021-05-31 DIAGNOSIS — I83018 Varicose veins of right lower extremity with ulcer other part of lower leg: Secondary | ICD-10-CM | POA: Diagnosis not present

## 2021-05-31 DIAGNOSIS — L97811 Non-pressure chronic ulcer of other part of right lower leg limited to breakdown of skin: Secondary | ICD-10-CM | POA: Diagnosis not present

## 2021-05-31 DIAGNOSIS — D649 Anemia, unspecified: Secondary | ICD-10-CM | POA: Diagnosis not present

## 2021-05-31 DIAGNOSIS — I4821 Permanent atrial fibrillation: Secondary | ICD-10-CM | POA: Diagnosis not present

## 2021-05-31 DIAGNOSIS — E038 Other specified hypothyroidism: Secondary | ICD-10-CM | POA: Diagnosis not present

## 2021-05-31 DIAGNOSIS — M1990 Unspecified osteoarthritis, unspecified site: Secondary | ICD-10-CM | POA: Diagnosis not present

## 2021-06-01 DIAGNOSIS — I4821 Permanent atrial fibrillation: Secondary | ICD-10-CM | POA: Diagnosis not present

## 2021-06-01 DIAGNOSIS — I83018 Varicose veins of right lower extremity with ulcer other part of lower leg: Secondary | ICD-10-CM | POA: Diagnosis not present

## 2021-06-01 DIAGNOSIS — E038 Other specified hypothyroidism: Secondary | ICD-10-CM | POA: Diagnosis not present

## 2021-06-01 DIAGNOSIS — M1990 Unspecified osteoarthritis, unspecified site: Secondary | ICD-10-CM | POA: Diagnosis not present

## 2021-06-01 DIAGNOSIS — D649 Anemia, unspecified: Secondary | ICD-10-CM | POA: Diagnosis not present

## 2021-06-01 DIAGNOSIS — L97811 Non-pressure chronic ulcer of other part of right lower leg limited to breakdown of skin: Secondary | ICD-10-CM | POA: Diagnosis not present

## 2021-06-07 DIAGNOSIS — I83018 Varicose veins of right lower extremity with ulcer other part of lower leg: Secondary | ICD-10-CM | POA: Diagnosis not present

## 2021-06-07 DIAGNOSIS — L97811 Non-pressure chronic ulcer of other part of right lower leg limited to breakdown of skin: Secondary | ICD-10-CM | POA: Diagnosis not present

## 2021-06-07 DIAGNOSIS — E038 Other specified hypothyroidism: Secondary | ICD-10-CM | POA: Diagnosis not present

## 2021-06-07 DIAGNOSIS — M1990 Unspecified osteoarthritis, unspecified site: Secondary | ICD-10-CM | POA: Diagnosis not present

## 2021-06-07 DIAGNOSIS — D649 Anemia, unspecified: Secondary | ICD-10-CM | POA: Diagnosis not present

## 2021-06-07 DIAGNOSIS — I4821 Permanent atrial fibrillation: Secondary | ICD-10-CM | POA: Diagnosis not present

## 2021-06-08 DIAGNOSIS — E038 Other specified hypothyroidism: Secondary | ICD-10-CM | POA: Diagnosis not present

## 2021-06-08 DIAGNOSIS — I83018 Varicose veins of right lower extremity with ulcer other part of lower leg: Secondary | ICD-10-CM | POA: Diagnosis not present

## 2021-06-08 DIAGNOSIS — M1990 Unspecified osteoarthritis, unspecified site: Secondary | ICD-10-CM | POA: Diagnosis not present

## 2021-06-08 DIAGNOSIS — L97811 Non-pressure chronic ulcer of other part of right lower leg limited to breakdown of skin: Secondary | ICD-10-CM | POA: Diagnosis not present

## 2021-06-08 DIAGNOSIS — D649 Anemia, unspecified: Secondary | ICD-10-CM | POA: Diagnosis not present

## 2021-06-08 DIAGNOSIS — I4821 Permanent atrial fibrillation: Secondary | ICD-10-CM | POA: Diagnosis not present

## 2021-06-09 DIAGNOSIS — E038 Other specified hypothyroidism: Secondary | ICD-10-CM | POA: Diagnosis not present

## 2021-06-09 DIAGNOSIS — D649 Anemia, unspecified: Secondary | ICD-10-CM | POA: Diagnosis not present

## 2021-06-09 DIAGNOSIS — M1990 Unspecified osteoarthritis, unspecified site: Secondary | ICD-10-CM | POA: Diagnosis not present

## 2021-06-09 DIAGNOSIS — I83018 Varicose veins of right lower extremity with ulcer other part of lower leg: Secondary | ICD-10-CM | POA: Diagnosis not present

## 2021-06-09 DIAGNOSIS — I4821 Permanent atrial fibrillation: Secondary | ICD-10-CM | POA: Diagnosis not present

## 2021-06-09 DIAGNOSIS — L97811 Non-pressure chronic ulcer of other part of right lower leg limited to breakdown of skin: Secondary | ICD-10-CM | POA: Diagnosis not present

## 2021-06-15 ENCOUNTER — Telehealth: Payer: Self-pay | Admitting: Internal Medicine

## 2021-06-15 DIAGNOSIS — I4821 Permanent atrial fibrillation: Secondary | ICD-10-CM | POA: Diagnosis not present

## 2021-06-15 DIAGNOSIS — D649 Anemia, unspecified: Secondary | ICD-10-CM | POA: Diagnosis not present

## 2021-06-15 DIAGNOSIS — L97811 Non-pressure chronic ulcer of other part of right lower leg limited to breakdown of skin: Secondary | ICD-10-CM | POA: Diagnosis not present

## 2021-06-15 DIAGNOSIS — I83018 Varicose veins of right lower extremity with ulcer other part of lower leg: Secondary | ICD-10-CM | POA: Diagnosis not present

## 2021-06-15 DIAGNOSIS — E038 Other specified hypothyroidism: Secondary | ICD-10-CM | POA: Diagnosis not present

## 2021-06-15 DIAGNOSIS — M1990 Unspecified osteoarthritis, unspecified site: Secondary | ICD-10-CM | POA: Diagnosis not present

## 2021-06-15 NOTE — Telephone Encounter (Signed)
Caller:  [name] from [place] Patient is having discomfort in her left leg.  Tramadol makes her dizzy - would it be okay if you called in voltarin or a lidocaine patch?  Phone:  [phone]

## 2021-06-16 ENCOUNTER — Other Ambulatory Visit: Payer: Self-pay | Admitting: Internal Medicine

## 2021-06-16 DIAGNOSIS — M159 Polyosteoarthritis, unspecified: Secondary | ICD-10-CM

## 2021-06-16 MED ORDER — DICLOFENAC SODIUM 1 % EX GEL: 2.0000 g | Freq: Four times a day (QID) | CUTANEOUS | 2 refills | Status: DC

## 2021-06-21 DIAGNOSIS — E038 Other specified hypothyroidism: Secondary | ICD-10-CM | POA: Diagnosis not present

## 2021-06-21 DIAGNOSIS — D649 Anemia, unspecified: Secondary | ICD-10-CM | POA: Diagnosis not present

## 2021-06-21 DIAGNOSIS — I83018 Varicose veins of right lower extremity with ulcer other part of lower leg: Secondary | ICD-10-CM | POA: Diagnosis not present

## 2021-06-21 DIAGNOSIS — I4821 Permanent atrial fibrillation: Secondary | ICD-10-CM | POA: Diagnosis not present

## 2021-06-21 DIAGNOSIS — L97811 Non-pressure chronic ulcer of other part of right lower leg limited to breakdown of skin: Secondary | ICD-10-CM | POA: Diagnosis not present

## 2021-06-21 DIAGNOSIS — M1990 Unspecified osteoarthritis, unspecified site: Secondary | ICD-10-CM | POA: Diagnosis not present

## 2021-06-22 DIAGNOSIS — L97811 Non-pressure chronic ulcer of other part of right lower leg limited to breakdown of skin: Secondary | ICD-10-CM | POA: Diagnosis not present

## 2021-06-22 DIAGNOSIS — I4821 Permanent atrial fibrillation: Secondary | ICD-10-CM | POA: Diagnosis not present

## 2021-06-22 DIAGNOSIS — M1990 Unspecified osteoarthritis, unspecified site: Secondary | ICD-10-CM | POA: Diagnosis not present

## 2021-06-22 DIAGNOSIS — D649 Anemia, unspecified: Secondary | ICD-10-CM | POA: Diagnosis not present

## 2021-06-22 DIAGNOSIS — E038 Other specified hypothyroidism: Secondary | ICD-10-CM | POA: Diagnosis not present

## 2021-06-22 DIAGNOSIS — I83018 Varicose veins of right lower extremity with ulcer other part of lower leg: Secondary | ICD-10-CM | POA: Diagnosis not present

## 2021-06-24 ENCOUNTER — Telehealth: Payer: Self-pay

## 2021-06-24 NOTE — Telephone Encounter (Signed)
Pt has called asking that a rx be sent in for traMADol (ULTRAM) 50 MG tablet

## 2021-06-25 ENCOUNTER — Other Ambulatory Visit: Payer: Self-pay | Admitting: Internal Medicine

## 2021-06-25 DIAGNOSIS — Z7901 Long term (current) use of anticoagulants: Secondary | ICD-10-CM | POA: Diagnosis not present

## 2021-06-25 DIAGNOSIS — M48062 Spinal stenosis, lumbar region with neurogenic claudication: Secondary | ICD-10-CM

## 2021-06-25 DIAGNOSIS — M1990 Unspecified osteoarthritis, unspecified site: Secondary | ICD-10-CM | POA: Diagnosis not present

## 2021-06-25 DIAGNOSIS — Z87891 Personal history of nicotine dependence: Secondary | ICD-10-CM | POA: Diagnosis not present

## 2021-06-25 DIAGNOSIS — Z96611 Presence of right artificial shoulder joint: Secondary | ICD-10-CM | POA: Diagnosis not present

## 2021-06-25 DIAGNOSIS — Z9181 History of falling: Secondary | ICD-10-CM | POA: Diagnosis not present

## 2021-06-25 DIAGNOSIS — M1991 Primary osteoarthritis, unspecified site: Secondary | ICD-10-CM

## 2021-06-25 DIAGNOSIS — Z96612 Presence of left artificial shoulder joint: Secondary | ICD-10-CM | POA: Diagnosis not present

## 2021-06-25 DIAGNOSIS — D649 Anemia, unspecified: Secondary | ICD-10-CM | POA: Diagnosis not present

## 2021-06-25 DIAGNOSIS — I4821 Permanent atrial fibrillation: Secondary | ICD-10-CM | POA: Diagnosis not present

## 2021-06-25 DIAGNOSIS — Z96651 Presence of right artificial knee joint: Secondary | ICD-10-CM | POA: Diagnosis not present

## 2021-06-25 DIAGNOSIS — Z8781 Personal history of (healed) traumatic fracture: Secondary | ICD-10-CM | POA: Diagnosis not present

## 2021-06-25 DIAGNOSIS — Z96641 Presence of right artificial hip joint: Secondary | ICD-10-CM | POA: Diagnosis not present

## 2021-06-25 DIAGNOSIS — L97811 Non-pressure chronic ulcer of other part of right lower leg limited to breakdown of skin: Secondary | ICD-10-CM | POA: Diagnosis not present

## 2021-06-25 DIAGNOSIS — I83018 Varicose veins of right lower extremity with ulcer other part of lower leg: Secondary | ICD-10-CM | POA: Diagnosis not present

## 2021-06-25 DIAGNOSIS — E038 Other specified hypothyroidism: Secondary | ICD-10-CM | POA: Diagnosis not present

## 2021-06-25 MED ORDER — TRAMADOL HCL 50 MG PO TABS: 50.0000 mg | ORAL_TABLET | Freq: Four times a day (QID) | ORAL | 3 refills | Status: AC | PRN

## 2021-06-28 DIAGNOSIS — I4821 Permanent atrial fibrillation: Secondary | ICD-10-CM | POA: Diagnosis not present

## 2021-06-28 DIAGNOSIS — E038 Other specified hypothyroidism: Secondary | ICD-10-CM | POA: Diagnosis not present

## 2021-06-28 DIAGNOSIS — D649 Anemia, unspecified: Secondary | ICD-10-CM | POA: Diagnosis not present

## 2021-06-28 DIAGNOSIS — I83018 Varicose veins of right lower extremity with ulcer other part of lower leg: Secondary | ICD-10-CM | POA: Diagnosis not present

## 2021-06-28 DIAGNOSIS — M1990 Unspecified osteoarthritis, unspecified site: Secondary | ICD-10-CM | POA: Diagnosis not present

## 2021-06-28 DIAGNOSIS — L97811 Non-pressure chronic ulcer of other part of right lower leg limited to breakdown of skin: Secondary | ICD-10-CM | POA: Diagnosis not present

## 2021-07-05 ENCOUNTER — Other Ambulatory Visit (HOSPITAL_COMMUNITY): Payer: Self-pay | Admitting: Nurse Practitioner

## 2021-07-05 DIAGNOSIS — I83018 Varicose veins of right lower extremity with ulcer other part of lower leg: Secondary | ICD-10-CM | POA: Diagnosis not present

## 2021-07-05 DIAGNOSIS — D649 Anemia, unspecified: Secondary | ICD-10-CM | POA: Diagnosis not present

## 2021-07-05 DIAGNOSIS — M1990 Unspecified osteoarthritis, unspecified site: Secondary | ICD-10-CM | POA: Diagnosis not present

## 2021-07-05 DIAGNOSIS — L97811 Non-pressure chronic ulcer of other part of right lower leg limited to breakdown of skin: Secondary | ICD-10-CM | POA: Diagnosis not present

## 2021-07-05 DIAGNOSIS — I4821 Permanent atrial fibrillation: Secondary | ICD-10-CM

## 2021-07-05 DIAGNOSIS — E038 Other specified hypothyroidism: Secondary | ICD-10-CM | POA: Diagnosis not present

## 2021-07-11 DIAGNOSIS — I4821 Permanent atrial fibrillation: Secondary | ICD-10-CM | POA: Diagnosis not present

## 2021-07-11 DIAGNOSIS — L97811 Non-pressure chronic ulcer of other part of right lower leg limited to breakdown of skin: Secondary | ICD-10-CM | POA: Diagnosis not present

## 2021-07-11 DIAGNOSIS — M1990 Unspecified osteoarthritis, unspecified site: Secondary | ICD-10-CM | POA: Diagnosis not present

## 2021-07-11 DIAGNOSIS — I83018 Varicose veins of right lower extremity with ulcer other part of lower leg: Secondary | ICD-10-CM | POA: Diagnosis not present

## 2021-07-11 DIAGNOSIS — D649 Anemia, unspecified: Secondary | ICD-10-CM | POA: Diagnosis not present

## 2021-07-11 DIAGNOSIS — E038 Other specified hypothyroidism: Secondary | ICD-10-CM | POA: Diagnosis not present

## 2021-07-20 ENCOUNTER — Other Ambulatory Visit: Payer: Self-pay | Admitting: Internal Medicine

## 2021-07-20 DIAGNOSIS — R600 Localized edema: Secondary | ICD-10-CM

## 2021-07-21 DIAGNOSIS — D649 Anemia, unspecified: Secondary | ICD-10-CM | POA: Diagnosis not present

## 2021-07-21 DIAGNOSIS — I4821 Permanent atrial fibrillation: Secondary | ICD-10-CM | POA: Diagnosis not present

## 2021-07-21 DIAGNOSIS — E038 Other specified hypothyroidism: Secondary | ICD-10-CM | POA: Diagnosis not present

## 2021-07-21 DIAGNOSIS — L97811 Non-pressure chronic ulcer of other part of right lower leg limited to breakdown of skin: Secondary | ICD-10-CM | POA: Diagnosis not present

## 2021-07-21 DIAGNOSIS — I83018 Varicose veins of right lower extremity with ulcer other part of lower leg: Secondary | ICD-10-CM | POA: Diagnosis not present

## 2021-07-21 DIAGNOSIS — M1990 Unspecified osteoarthritis, unspecified site: Secondary | ICD-10-CM | POA: Diagnosis not present

## 2021-08-09 DIAGNOSIS — M1612 Unilateral primary osteoarthritis, left hip: Secondary | ICD-10-CM | POA: Diagnosis not present

## 2021-08-15 ENCOUNTER — Encounter: Payer: Self-pay | Admitting: Internal Medicine

## 2021-08-15 ENCOUNTER — Ambulatory Visit (INDEPENDENT_AMBULATORY_CARE_PROVIDER_SITE_OTHER): Payer: Medicare Other | Admitting: Internal Medicine

## 2021-08-15 ENCOUNTER — Other Ambulatory Visit: Payer: Self-pay

## 2021-08-15 ENCOUNTER — Telehealth: Payer: Self-pay

## 2021-08-15 VITALS — BP 112/68 | HR 83 | Temp 98.0°F | Ht 62.0 in | Wt 141.0 lb

## 2021-08-15 DIAGNOSIS — E034 Atrophy of thyroid (acquired): Secondary | ICD-10-CM | POA: Diagnosis not present

## 2021-08-15 DIAGNOSIS — I4821 Permanent atrial fibrillation: Secondary | ICD-10-CM | POA: Diagnosis not present

## 2021-08-15 DIAGNOSIS — Z515 Encounter for palliative care: Secondary | ICD-10-CM | POA: Diagnosis not present

## 2021-08-15 DIAGNOSIS — M1612 Unilateral primary osteoarthritis, left hip: Secondary | ICD-10-CM

## 2021-08-15 MED ORDER — FENTANYL 12 MCG/HR TD PT72: 1.0000 | MEDICATED_PATCH | TRANSDERMAL | 0 refills | Status: DC

## 2021-08-15 NOTE — Telephone Encounter (Signed)
Approved ?05/22/2021 - 08/15/2022 ?

## 2021-08-15 NOTE — Telephone Encounter (Signed)
Key: [id number] ?

## 2021-08-15 NOTE — Patient Instructions (Signed)
Osteoarthritis Osteoarthritis is a type of arthritis. It refers to joint pain or joint disease. Osteoarthritis affects tissue that covers the ends of bones in joints (cartilage). Cartilage acts as a cushion between the bones and helps them move smoothly. Osteoarthritis occurs when cartilage in the joints gets worn down. Osteoarthritis is sometimes called "wear and tear" arthritis. Osteoarthritis is the most common form of arthritis. It often occurs in older people. It is a condition that gets worse over time. The joints most often affected by this condition are in the fingers, toes, hips, knees, and spine, including the neck and lower back. What are the causes? This condition is caused by the wearing down of cartilage that covers the ends of bones. What increases the risk? The following factors may make you more likely to develop this condition: Being age 50 or older. Obesity. Overuse of joints. Past injury of a joint. Past surgery on a joint. Family history of osteoarthritis. What are the signs or symptoms? The main symptoms of this condition are pain, swelling, and stiffness in the joint. Other symptoms may include: An enlarged joint. More pain and further damage caused by small pieces of bone or cartilage that break off and float inside of the joint. Small deposits of bone (osteophytes) that grow on the edges of the joint. A grating or scraping feeling inside the joint when you move it. Popping or creaking sounds when you move. Difficulty walking or exercising. An inability to grip items, twist your hand(s), or control the movements of your hands and fingers. How is this diagnosed? This condition may be diagnosed based on: Your medical history. A physical exam. Your symptoms. X-rays of the affected joint(s). Blood tests to rule out other types of arthritis. How is this treated? There is no cure for this condition, but treatment can help control pain and improve joint function.  Treatment may include a combination of therapies, such as: Pain relief techniques, such as: Applying heat and cold to the joint. Massage. A form of talk therapy called cognitive behavioral therapy (CBT). This therapy helps you set goals and follow up on the changes that you make. Medicines for pain and inflammation. The medicines can be taken by mouth or applied to the skin. They include: NSAIDs, such as ibuprofen. Prescription medicines. Strong anti-inflammatory medicines (corticosteroids). Certain nutritional supplements. A prescribed exercise program. You may work with a physical therapist. Assistive devices, such as a brace, wrap, splint, specialized glove, or cane. A weight control plan. Surgery, such as: An osteotomy. This is done to reposition the bones and relieve pain or to remove loose pieces of bone and cartilage. Joint replacement surgery. You may need this surgery if you have advanced osteoarthritis. Follow these instructions at home: Activity Rest your affected joints as told by your health care provider. Exercise as told by your health care provider. He or she may recommend specific types of exercise, such as: Strengthening exercises. These are done to strengthen the muscles that support joints affected by arthritis. Aerobic activities. These are exercises, such as brisk walking or water aerobics, that increase your heart rate. Range-of-motion activities. These help your joints move more easily. Balance and agility exercises. Managing pain, stiffness, and swelling   If directed, apply heat to the affected area as often as told by your health care provider. Use the heat source that your health care provider recommends, such as a moist heat pack or a heating pad. If you have a removable assistive device, remove it as told by   your health care provider. Place a towel between your skin and the heat source. If your health care provider tells you to keep the assistive device on  while you apply heat, place a towel between the assistive device and the heat source. Leave the heat on for 20-30 minutes. Remove the heat if your skin turns bright red. This is especially important if you are unable to feel pain, heat, or cold. You may have a greater risk of getting burned. If directed, put ice on the affected area. To do this: If you have a removable assistive device, remove it as told by your health care provider. Put ice in a plastic bag. Place a towel between your skin and the bag. If your health care provider tells you to keep the assistive device on during icing, place a towel between the assistive device and the bag. Leave the ice on for 20 minutes, 2-3 times a day. Move your fingers or toes often to reduce stiffness and swelling. Raise (elevate) the injured area above the level of your heart while you are sitting or lying down. General instructions Take over-the-counter and prescription medicines only as told by your health care provider. Maintain a healthy weight. Follow instructions from your health care provider for weight control. Do not use any products that contain nicotine or tobacco, such as cigarettes, e-cigarettes, and chewing tobacco. If you need help quitting, ask your health care provider. Use assistive devices as told by your health care provider. Keep all follow-up visits as told by your health care provider. This is important. Where to find more information National Institute of Arthritis and Musculoskeletal and Skin Diseases: www.niams.nih.gov National Institute on Aging: www.nia.nih.gov American College of Rheumatology: www.rheumatology.org Contact a health care provider if: You have redness, swelling, or a feeling of warmth in a joint that gets worse. You have a fever along with joint or muscle aches. You develop a rash. You have trouble doing your normal activities. Get help right away if: You have pain that gets worse and is not relieved by  pain medicine. Summary Osteoarthritis is a type of arthritis that affects tissue covering the ends of bones in joints (cartilage). This condition is caused by the wearing down of cartilage that covers the ends of bones. The main symptom of this condition is pain, swelling, and stiffness in the joint. There is no cure for this condition, but treatment can help control pain and improve joint function. This information is not intended to replace advice given to you by your health care provider. Make sure you discuss any questions you have with your health care provider. Document Revised: 05/05/2019 Document Reviewed: 05/05/2019 Elsevier Patient Education  2022 [employer]vier Inc.  

## 2021-08-15 NOTE — Progress Notes (Signed)
? ?Subjective:  ?Patient ID: Katrina Velez, female    DOB: [birth year]  Age: 88 y.o. MRN: [id number] ? ?CC: Atrial Fibrillation, Hypothyroidism, and Osteoarthritis ? ?This visit occurred during the SARS-CoV-2 public health emergency.  Safety protocols were in place, including screening questions prior to the visit, additional usage of staff PPE, and extensive cleaning of exam room while observing appropriate contact time as indicated for disinfecting solutions.   ? ?HPI ?Katrina Velez presents for f/up -  ? ?She complains of chronic, worsening left hip pain.  She recently saw an orthopedist and had a steroid injection which did not help.  She is not getting much symptom relief with tramadol and acetaminophen.  She complains that the pain interferes with her daily activities. ? ? ?Outpatient Medications Prior to Visit  ?Medication Sig Dispense Refill  ? apixaban (ELIQUIS) 5 MG TABS tablet Take 1 tablet (5 mg total) by mouth 2 (two) times daily. 180 tablet 3  ? CARDIZEM LA 120 MG 24 hr tablet TAKE 1 TABLET BY MOUTH 2 TIMES DAILY. 180 tablet 1  ? diclofenac Sodium (VOLTAREN) 1 % GEL Apply 2 g topically 4 (four) times daily. 350 g 2  ? docusate sodium (COLACE) 100 MG capsule Take 1 capsule (100 mg total) by mouth daily as needed for mild constipation. 10 capsule 0  ? metoprolol succinate (TOPROL-XL) 25 MG 24 hr tablet TAKE 1 TABLET BY MOUTH TWICE A DAY 180 tablet 1  ? SYNTHROID 125 MCG tablet Take 1 tablet (125 mcg total) by mouth daily. 90 tablet 1  ? torsemide (DEMADEX) 20 MG tablet TAKE 1 TABLET BY MOUTH EVERY DAY 90 tablet 0  ? ?No facility-administered medications prior to visit.  ? ? ?ROS ?Review of Systems  ?Constitutional: Negative.  Negative for chills, diaphoresis, fatigue and fever.  ?HENT: Negative.    ?Eyes: Negative.   ?Respiratory:  Negative for cough, chest tightness, shortness of breath and wheezing.   ?Cardiovascular:  Positive for palpitations. Negative for chest pain and leg swelling.  ?Gastrointestinal:   Negative for abdominal pain, diarrhea, nausea and vomiting.  ?Endocrine: Negative.   ?Genitourinary: Negative.  Negative for difficulty urinating.  ?Musculoskeletal:  Positive for arthralgias and gait problem. Negative for back pain and myalgias.  ?Skin: Negative.   ?Neurological:  Negative for dizziness, weakness, light-headedness and headaches.  ?Hematological:  Negative for adenopathy. Does not bruise/bleed easily.  ?Psychiatric/Behavioral: Negative.    ? ?Objective:  ?BP 112/68 (BP Location: Left Arm, Patient Position: Sitting, Cuff Size: Large)   Pulse 83   Temp 98 ?F (36.7 ?C) (Oral)   Ht 5\' 2"  (1.575 m)   Wt 141 lb (64 kg)   SpO2 98%   BMI 25.79 kg/m?  ? ?BP Readings from Last 3 Encounters:  ?08/15/21 112/68  ?05/10/21 120/72  ?04/19/21 (!) 110/54  ? ? ?Wt Readings from Last 3 Encounters:  ?08/15/21 141 lb (64 kg)  ?04/19/21 144 lb 12.8 oz (65.7 kg)  ?11/18/20 145 lb 1 oz (65.8 kg)  ? ? ?Physical Exam ?Vitals reviewed.  ?HENT:  ?   Nose: Nose normal.  ?   Mouth/Throat:  ?   Mouth: Mucous membranes are moist.  ?Eyes:  ?   General: No scleral icterus. ?   Conjunctiva/sclera: Conjunctivae normal.  ?Cardiovascular:  ?   Rate and Rhythm: Normal rate. Rhythm irregularly irregular.  ?   Pulses: Normal pulses.  ?   Heart sounds: No murmur heard. ?Pulmonary:  ?   Effort: Pulmonary effort is normal.  ?  Breath sounds: No stridor. No wheezing, rhonchi or rales.  ?Abdominal:  ?   General: Abdomen is flat.  ?   Palpations: There is no mass.  ?   Tenderness: There is no abdominal tenderness. There is no guarding.  ?   Hernia: No hernia is present.  ?Musculoskeletal:  ?   Cervical back: Neck supple.  ?Skin: ?   General: Skin is warm and dry.  ?Neurological:  ?   General: No focal deficit present.  ?   Mental Status: She is alert.  ?Psychiatric:     ?   Mood and Affect: Mood normal.  ? ? ?Lab Results  ?Component Value Date  ? WBC 7.1 05/10/2021  ? HGB 11.9 (L) 05/10/2021  ? HCT 36.7 05/10/2021  ? PLT 321.0 05/10/2021   ? GLUCOSE 113 (H) 05/10/2021  ? CHOL 193 05/27/2014  ? TRIG 107 05/27/2014  ? HDL 66 05/27/2014  ? LDLCALC 106 (H) 05/27/2014  ? ALT 9 05/10/2021  ? AST 17 05/10/2021  ? NA 137 05/10/2021  ? K 4.2 05/10/2021  ? CL 103 05/10/2021  ? CREATININE 0.69 05/10/2021  ? BUN 18 05/10/2021  ? CO2 29 05/10/2021  ? TSH 2.14 05/10/2021  ? INR 1.6 (H) 11/16/2020  ? ? ?No results found. ? ?Assessment & Plan:  ? ?Katrina Velez was seen today for atrial fibrillation, hypothyroidism and osteoarthritis. ? ?Diagnoses and all orders for this visit: ? ?Encounter for palliative care involving management of pain ?-     fentaNYL (DURAGESIC) 12 MCG/HR; Place 1 patch onto the skin every 3 (three) days. ? ?Primary osteoarthritis of left hip ?-     fentaNYL (DURAGESIC) 12 MCG/HR; Place 1 patch onto the skin every 3 (three) days. ? ?Hypothyroidism due to acquired atrophy of thyroid- Her recent TSH was in the normal range and she is euthyroid. ? ?Permanent atrial fibrillation (HCC)- She has good rate control.  Will continue the DOAC. ? ? ?I am having Katrina Velez start on fentaNYL. I am also having her maintain her docusate sodium, apixaban, metoprolol succinate, Synthroid, diclofenac Sodium, Cardizem LA, and torsemide. ? ?Meds ordered this encounter  ?Medications  ? fentaNYL (DURAGESIC) 12 MCG/HR  ?  Sig: Place 1 patch onto the skin every 3 (three) days.  ?  Dispense:  5 patch  ?  Refill:  0  ? ? ? ?Follow-up: Return in about 3 months (around 11/15/2021). ? ?Sanda Linger, MD ?

## 2021-08-29 ENCOUNTER — Telehealth: Payer: Self-pay | Admitting: Internal Medicine

## 2021-08-29 ENCOUNTER — Other Ambulatory Visit: Payer: Self-pay | Admitting: Internal Medicine

## 2021-08-29 NOTE — Telephone Encounter (Signed)
Pt wants to know if there should be a refill on fentaNYL (DURAGESIC) 12 MCG/HR.  ? ?States it has not cleared up her issue completely.  ? ?[id number] - [place], [place] - [address] Phone:  [phone]  ?Fax:  [phone]  ?  ? ?

## 2021-08-30 ENCOUNTER — Other Ambulatory Visit: Payer: Self-pay | Admitting: Internal Medicine

## 2021-08-30 NOTE — Telephone Encounter (Signed)
Pt's daughter would like to know alternatives if the patch can not be approved. She also mentioned that the tramadol does not control the pain.  ?

## 2021-09-20 DIAGNOSIS — M25562 Pain in left knee: Secondary | ICD-10-CM | POA: Diagnosis not present

## 2021-10-11 ENCOUNTER — Telehealth: Payer: Self-pay | Admitting: Internal Medicine

## 2021-10-11 NOTE — Telephone Encounter (Signed)
LVM for pt to rtn my call to schedule AWV with [facility]. Call back # [phone] 

## 2021-10-12 ENCOUNTER — Ambulatory Visit (HOSPITAL_COMMUNITY)
Admission: RE | Admit: 2021-10-12 | Discharge: 2021-10-12 | Disposition: A | Discharge status: Home / Self Care | Payer: Medicare Other | Source: Ambulatory Visit | Attending: Nurse Practitioner | Admitting: Nurse Practitioner

## 2021-10-12 ENCOUNTER — Encounter (HOSPITAL_COMMUNITY): Payer: Self-pay | Admitting: Nurse Practitioner

## 2021-10-12 VITALS — BP 104/60 | HR 96 | Ht 62.0 in | Wt 138.0 lb

## 2021-10-12 DIAGNOSIS — I4821 Permanent atrial fibrillation: Secondary | ICD-10-CM | POA: Diagnosis not present

## 2021-10-12 DIAGNOSIS — Z7901 Long term (current) use of anticoagulants: Secondary | ICD-10-CM | POA: Diagnosis not present

## 2021-10-12 DIAGNOSIS — D6869 Other thrombophilia: Secondary | ICD-10-CM

## 2021-10-12 NOTE — Progress Notes (Signed)
Patient ID: Katrina Velez, female   DOB: 1926-02-04, 88 y.o.   MRN: [id number]       Primary Care Physician: Etta Grandchild, MD Referring Physician: Dr. Laqueta Jean Normoyle is a 88 y.o. female with a h/o afib that was loaded on amiodarone and successfully cardioverted 4/13, but with ERAF. She did not see that much difference in SR and had some thyroid concerns  already on replacement, so decision was made to stop amiodarone and aim for rate control,HR has been staying below 100 at home. Her energy is good for her age and she is back to hanging clothes outside. No issues with anticoagulant.   Returns to afib clinic 8/23, after she has noticed at home some fluctuation in heart rate. Her daughter on taking her heart rate and Bp one day, noticed that hr heart rate was 48 bpm, although the pt did not feel bad. She talked to the on call provider and was advised to skip the next rate control drug. A few days later, the pt had a nonsustained episode of rvr at 150 bpm. She did feel lightheaded with that episode. On call provider was contacted and she was told to how to take extra rate control if needed. However after a few more minutes, her HR slowed down.  EKG showed afib with controlled afib. She is also upset because she feels that she needs more thyroid replacement. She has also felt better on a higher dose than what the labs suggest that she takes. Her synthroid was reduced several months back and the pt is c/o thinning hair, thinning nails, more fatigue, depression. She is planning to change to a new MD in the first of October to see if she can get straightened out with her thyroid.   Returns 9/6, holter monitor placed on previous visit and reviewed. Shows average HR around 107 bpm, high rate 160 bpm, minimum 40 bpm at 4:30 am. Pt was not aware of fast or slow heart rate.She could benefit from more rate control. She is still able to do her house work with minimal symptoms and feels that she has good rate  control.   F/u in the afib clinic 10/25 and has good rate control. Currently is on metoprolol ER 25 mg bid and cardizem 120 mg bid. Tried higher doses of BB but could not tolerate. She is tolerating this combination of rate control with HR in the 80's.   F/u in afib clinic,4/25, she is feeling well. Does not feel afib . No issues with shortness of breath or pedal edema. No bleeding issues with eliquis.  F/u in fib clinic, 09/20/17. She has now turned 89 and is doing well. She did lacerate her leg on a patio chair about 6 weeks  Ago and did need stitches. It is healing but a large dark scab is still present. Otherwise, no complaints.  F/u in afib clinic, 03/28/18. She is doing very well. No complaints. It took the area on her leg described above, 6 months to heal and eventually this was done with the help of the would clinic. She is now 89 and going strong. Her renal panel for June showed a creatinine of 0.84 and with her current weight, she still qualifies for 5 mg eliquis bid. No bleeding issues.  F/u in afib clinic, 03/25/19. She continues in rate controlled afib.She did have a fall at home  and went to the ER with bruising and a couple of stitches.No major trauma. She  is now walking with a cane in the house and has a fall alert bracelet now. Does not know why she fell, just  got off balance.    F/u 09/23/19 in afib clinic. She feels well. Relieved to have has her covid shots so she can be more active. She and her daughter went to the beach in April and she really enjoyed the change in scenery. Her afib is rate controlled and she does not feel it. She  had a major fall last fall, mostly bruising, but no further reports of fall. Walks with a cane.     F/u in afib clinic, 03/22/20. Overall no  issues with afib, she is rate controlled   at home in the 80's/90's. Her biggest concern is that she  feels she needs at least 125 mcg levothyroxine to feel her best. This  has been her dose for years and when she  tries to reduce it, she feels terrible. She states that Dr. Carlota Raspberry  before he retired would listen to her and her reason for the higher dose, not go strictly by lab values,  but her last few  physician's want to go strictly by the TSH. I explained to her that the lab  values are what is used to adjust meds but she feels it should be how she feels. She was recommended by Dr. Mariea Clonts to reduce to 112 mcg daily but she is taking an higher dose than  this, having older med doses  around the house, I believe she indicated she was taking 125 mcg daily.  F/u in afib clinic, 09/30/20. She remains in permanent afib with controlled ventricular rate. No recent falls or other complaints today. Her weight has declined but still above 60 kgs (61.8 kgs) and creatinine in less than 1.5, so  she will continue on eliquis 5 mg bid. No bleeding issues.   F/u in afib clinic, 04/19/21. She remains in rate controlled permament afib. Since last visit, she had a mechanical fall and had surgery for Rt  femur fracture. She had surgery without any complications. She is planing a new PCP visit in December.   F/u in afib clinic, 10/12/21. She remains in rate controlled afib she is having a lot of pain in her left knee and hip and is not felt to be a surgical candidate. She is pending an appointment with the pain clinic in the future.   Today, she denies symptoms of palpitations, chest pain, shortness of breath, orthopnea, PND, lower extremity edema, dizziness, presyncope, syncope, or neurologic sequela. The patient is tolerating medications without difficulties and is otherwise without complaint today.   Past Medical History:  Diagnosis Date   A-fib ([place]) 05/2015   HOSPITALIZED    Arthritis    Cataract    Hypothyroidism    Permanent atrial fibrillation ([place])    Thyroid disease    Past Surgical History:  Procedure Laterality Date   CARDIOVERSION N/A 06/08/2015   Procedure: CARDIOVERSION;  Surgeon: Jerline Pain, MD;  Location: [facility]  ENDOSCOPY;  Service: Cardiovascular;  Laterality: N/A;   CARDIOVERSION N/A 09/02/2015   Procedure: CARDIOVERSION;  Surgeon: Herminio Commons, MD;  Location: [place];  Service: Cardiovascular;  Laterality: N/A;   CATARACT EXTRACTION, BILATERAL Bilateral 2010   JOINT REPLACEMENT     KNEE SURGERY  2002   ORIF FEMUR FRACTURE Right 11/18/2020   Procedure: OPEN REDUCTION INTERNAL FIXATION (ORIF) DISTAL FEMUR FRACTURE;  Surgeon: Shona Needles, MD;  Location: [facility];  Service:  Orthopedics;  Laterality: Right;   right hip replacement     RIGHT KNEE REPLACEMENT  2011   TEE WITHOUT CARDIOVERSION N/A 06/08/2015   Procedure: TRANSESOPHAGEAL ECHOCARDIOGRAM (TEE);  Surgeon: Jerline Pain, MD;  Location: [place];  Service: Cardiovascular;  Laterality: N/A;   TOTAL HIP ARTHROPLASTY Right 2011   TOTAL SHOULDER REPLACEMENT Right 2003   TOTAL SHOULDER REPLACEMENT Left 2007   WRIST SURGERY Bilateral 2008   both wrists    Current Outpatient Medications  Medication Sig Dispense Refill   apixaban (ELIQUIS) 5 MG TABS tablet Take 1 tablet (5 mg total) by mouth 2 (two) times daily. 180 tablet 3   CARDIZEM LA 120 MG 24 hr tablet TAKE 1 TABLET BY MOUTH 2 TIMES DAILY. 180 tablet 1   diclofenac Sodium (VOLTAREN) 1 % GEL Apply 2 g topically 4 (four) times daily. 350 g 2   metoprolol succinate (TOPROL-XL) 25 MG 24 hr tablet TAKE 1 TABLET BY MOUTH TWICE A DAY 180 tablet 1   SYNTHROID 125 MCG tablet Take 1 tablet (125 mcg total) by mouth daily. 90 tablet 1   torsemide (DEMADEX) 20 MG tablet TAKE 1 TABLET BY MOUTH EVERY DAY 90 tablet 0   No current facility-administered medications for this encounter.    Allergies  Allergen Reactions   Banana Nausea Only    All banana products also   Penicillins Hives    Social History   Socioeconomic History   Marital status: Widowed    Spouse name: Not on file   Number of children: Not on file   Years of education: Not on file   Highest education level: Not on  file  Occupational History   Not on file  Tobacco Use   Smoking status: Former    Packs/day: 1.00    Years: 65.00    Pack years: 65.00    Types: Cigarettes    Quit date: 05/28/2011    Years since quitting: 10.3   Smokeless tobacco: Never  Vaping Use   Vaping Use: Former  Substance and Sexual Activity   Alcohol use: Yes    Alcohol/week: 1.0 - 2.0 standard drink    Types: 1 - 2 Glasses of wine per week    Comment: occasional drink   Drug use: No   Sexual activity: Not on file  Other Topics Concern   Not on file  Social History Narrative   DIET: NONE      DO YOU DRINK/EAT THINGS WITH CAFFEINE: YES, COFFEE AND TEA       MARITAL STATUS: WIDOWED      WHAT YEAR WERE YOU MARRIED:1950      DO YOU LIVE IN A HOUSE, APARTMENT, ASSISTED LIVING, CONDO TRAILER ETC.: HOUSE       IS IT ONE OR MORE STORIES: 2 STORIES      HOW MANY PERSONS LIVE IN YOUR HOME: 1      DO YOU HAVE PETS IN YOUR HOME: 1 CAT AND A PART TIME DOG       CURRENT OR PAST PROFESSION: CIVIL SERVANT      DO YOU EXERCISE: NO       WHAT TYPE AND HOW OFTEN:NO   Social Determinants of Health   Financial Resource Strain: Not on file  Food Insecurity: Not on file  Transportation Needs: Not on file  Physical Activity: Not on file  Stress: Not on file  Social Connections: Not on file  Intimate Partner Violence: Not on file    Family History  Problem Relation Age of Onset   Hypercalcemia Daughter    Hypertension Daughter     ROS- All systems are reviewed and negative except as per the HPI above  Physical Exam: Vitals:   10/12/21 1147  BP: 104/60  Pulse: 96  Weight: 62.6 kg  Height: 5\' 2"  (1.575 m)    GEN- The patient is well appearing, alert and oriented x 3 today.   Head- normocephalic, atraumatic Eyes-  Sclera clear, conjunctiva pink Ears- hearing intact Oropharynx- clear Neck- supple, no JVP Lymph- no cervical lymphadenopathy Lungs- Clear to ausculation bilaterally, normal work of  breathing Heart- Irregular rate and rhythm, no murmurs, rubs or gallops, PMI not laterally displaced GI- soft, NT, ND, + BS Extremities- no clubbing, cyanosis, or edema MS- no significant deformity or atrophy Skin- no rash or lesion Psych- euthymic mood, full affect Neuro- strength and sensation are intact  EKG-afib at 96 bpm, qrs int 86 ms, qtc 449 ms Epic records reviewed   Assessment and Plan: 1.Long standing permanent afib Rate controlled Recuperated from rt femur fx surgery 11/15/20 but left hip and knee now very painful for her, unfortunately can not use po antiinflammatories for increased risk of bleeding  Continue metoprolol ER 25 mg bid and cardizem 120 mg bid, appears to have HR well controlled with these doses Continue eliquis 5 mg bid, appropriately dosed at this time, but if she should lose another 5-6 lbs will have to lower the dose to 2.5 mg bid   F/u 6 months  Butch Penny C. Nyellie Yetter, [facility] [address] [place], [place] [address] [phone]

## 2021-10-18 DIAGNOSIS — Z23 Encounter for immunization: Secondary | ICD-10-CM | POA: Diagnosis not present

## 2021-10-20 DIAGNOSIS — M25552 Pain in left hip: Secondary | ICD-10-CM | POA: Diagnosis not present

## 2021-10-20 DIAGNOSIS — M25562 Pain in left knee: Secondary | ICD-10-CM | POA: Diagnosis not present

## 2021-11-02 ENCOUNTER — Other Ambulatory Visit (HOSPITAL_COMMUNITY): Payer: Self-pay | Admitting: Nurse Practitioner

## 2021-11-02 DIAGNOSIS — I4821 Permanent atrial fibrillation: Secondary | ICD-10-CM

## 2021-11-15 ENCOUNTER — Encounter: Payer: Self-pay | Admitting: Internal Medicine

## 2021-11-15 ENCOUNTER — Ambulatory Visit (INDEPENDENT_AMBULATORY_CARE_PROVIDER_SITE_OTHER): Payer: Medicare Other | Admitting: Internal Medicine

## 2021-11-15 ENCOUNTER — Telehealth: Payer: Self-pay | Admitting: Internal Medicine

## 2021-11-15 VITALS — BP 110/70 | HR 49 | Temp 97.5°F | Ht 62.0 in

## 2021-11-15 DIAGNOSIS — R600 Localized edema: Secondary | ICD-10-CM | POA: Diagnosis not present

## 2021-11-15 DIAGNOSIS — M1612 Unilateral primary osteoarthritis, left hip: Secondary | ICD-10-CM | POA: Diagnosis not present

## 2021-11-15 DIAGNOSIS — M159 Polyosteoarthritis, unspecified: Secondary | ICD-10-CM | POA: Diagnosis not present

## 2021-11-15 DIAGNOSIS — Z515 Encounter for palliative care: Secondary | ICD-10-CM

## 2021-11-15 DIAGNOSIS — I4821 Permanent atrial fibrillation: Secondary | ICD-10-CM

## 2021-11-15 DIAGNOSIS — E034 Atrophy of thyroid (acquired): Secondary | ICD-10-CM | POA: Diagnosis not present

## 2021-11-15 DIAGNOSIS — M48062 Spinal stenosis, lumbar region with neurogenic claudication: Secondary | ICD-10-CM | POA: Diagnosis not present

## 2021-11-15 MED ORDER — SYNTHROID 125 MCG PO TABS: 125.0000 ug | ORAL_TABLET | Freq: Every day | ORAL | 1 refills | Status: DC

## 2021-11-15 MED ORDER — FENTANYL 25 MCG/HR TD PT72: 1.0000 | MEDICATED_PATCH | TRANSDERMAL | 0 refills | Status: DC

## 2021-11-15 MED ORDER — TORSEMIDE 20 MG PO TABS: 20.0000 mg | ORAL_TABLET | Freq: Every day | ORAL | 1 refills | Status: DC

## 2021-11-15 NOTE — Progress Notes (Signed)
Subjective:  Patient ID: Katrina Velez, female    DOB: [birth year]  Age: 88 y.o. MRN: [id number]  CC: Hypertension, Hypothyroidism, and Osteoarthritis   HPI Katrina Velez presents for f/up -  She complains of pain in her left hip and knee that interferes with her sleep and her daily activities.  She has been seeing an orthopedist and says that x-rays have been done and injections have not been helpful.  She has not gotten much symptom relief with tramadol, hydrocodone, THC Gummies, CBD Gummies, and multiple other supplements.  Outpatient Medications Prior to Visit  Medication Sig Dispense Refill   apixaban (ELIQUIS) 5 MG TABS tablet Take 1 tablet (5 mg total) by mouth 2 (two) times daily. 180 tablet 3   CARDIZEM LA 120 MG 24 hr tablet TAKE 1 TABLET BY MOUTH 2 TIMES DAILY. 180 tablet 1   diclofenac Sodium (VOLTAREN) 1 % GEL Apply 2 g topically 4 (four) times daily. 350 g 2   metoprolol succinate (TOPROL-XL) 25 MG 24 hr tablet TAKE 1 TABLET BY MOUTH TWICE A DAY 180 tablet 1   SYNTHROID 125 MCG tablet Take 1 tablet (125 mcg total) by mouth daily. 90 tablet 1   torsemide (DEMADEX) 20 MG tablet TAKE 1 TABLET BY MOUTH EVERY DAY 90 tablet 0   No facility-administered medications prior to visit.    ROS Review of Systems  Constitutional:  Negative for chills, diaphoresis, fatigue and fever.  HENT: Negative.    Eyes: Negative.   Respiratory:  Negative for cough, chest tightness, shortness of breath and wheezing.   Cardiovascular:  Negative for chest pain, palpitations and leg swelling.  Gastrointestinal:  Negative for abdominal pain and nausea.  Endocrine: Negative.   Genitourinary: Negative.  Negative for difficulty urinating.  Musculoskeletal:  Positive for arthralgias and back pain. Negative for myalgias.  Neurological: Negative.  Negative for dizziness, weakness, light-headedness and numbness.  Hematological:  Negative for adenopathy. Does not bruise/bleed easily.   Psychiatric/Behavioral: Negative.      Objective:  BP 110/70 (BP Location: Left Arm, Patient Position: Sitting, Cuff Size: Normal)   Pulse (!) 49   Temp (!) 97.5 F (36.4 C) (Oral)   Ht 5\' 2"  (1.575 m)   SpO2 99%   BMI 25.24 kg/m   BP Readings from Last 3 Encounters:  11/15/21 110/70  10/12/21 104/60  08/15/21 112/68    Wt Readings from Last 3 Encounters:  10/12/21 138 lb (62.6 kg)  08/15/21 141 lb (64 kg)  04/19/21 144 lb 12.8 oz (65.7 kg)    Physical Exam Vitals reviewed.  Constitutional:      Appearance: She is ill-appearing (in a wheelchair).  HENT:     Nose: Nose normal.     Mouth/Throat:     Mouth: Mucous membranes are moist.  Eyes:     General: No scleral icterus.    Conjunctiva/sclera: Conjunctivae normal.  Cardiovascular:     Rate and Rhythm: Rhythm irregularly irregular.     Heart sounds: No murmur heard.    No friction rub.  Pulmonary:     Effort: Pulmonary effort is normal.     Breath sounds: No stridor. No wheezing, rhonchi or rales.  Abdominal:     General: Abdomen is flat.     Palpations: There is no mass.     Tenderness: There is no abdominal tenderness. There is no guarding.     Hernia: No hernia is present.  Musculoskeletal:        General: Deformity (djd) present.  No swelling.     Cervical back: Neck supple.     Right lower leg: No edema.     Left lower leg: No edema.  Skin:    General: Skin is warm and dry.     Coloration: Skin is not pale.  Neurological:     General: No focal deficit present.     Lab Results  Component Value Date   WBC 7.1 05/10/2021   HGB 11.9 (L) 05/10/2021   HCT 36.7 05/10/2021   PLT 321.0 05/10/2021   GLUCOSE 113 (H) 05/10/2021   CHOL 193 05/27/2014   TRIG 107 05/27/2014   HDL 66 05/27/2014   LDLCALC 106 (H) 05/27/2014   ALT 9 05/10/2021   AST 17 05/10/2021   NA 137 05/10/2021   K 4.2 05/10/2021   CL 103 05/10/2021   CREATININE 0.69 05/10/2021   BUN 18 05/10/2021   CO2 29 05/10/2021   TSH 2.14  05/10/2021   INR 1.6 (H) 11/16/2020    No results found.  Assessment & Plan:   Katrina Velez was seen today for hypertension, hypothyroidism and osteoarthritis.  Diagnoses and all orders for this visit:  Permanent atrial fibrillation (HCC)-we will continue the DOAC. -     Basic metabolic panel; Future  Bilateral leg edema -     torsemide (DEMADEX) 20 MG tablet; Take 1 tablet (20 mg total) by mouth daily.  Hypothyroidism due to acquired atrophy of thyroid -     SYNTHROID 125 MCG tablet; Take 1 tablet (125 mcg total) by mouth daily. -     CBC with Differential/Platelet; Future -     TSH; Future  Primary osteoarthritis of left hip -     fentaNYL (DURAGESIC) 25 MCG/HR; Place 1 patch onto the skin every 3 (three) days.  Encounter for palliative care involving management of pain -     fentaNYL (DURAGESIC) 25 MCG/HR; Place 1 patch onto the skin every 3 (three) days.  Spinal stenosis of lumbar region with neurogenic claudication -     fentaNYL (DURAGESIC) 25 MCG/HR; Place 1 patch onto the skin every 3 (three) days.  Primary osteoarthritis involving multiple joints -     fentaNYL (DURAGESIC) 25 MCG/HR; Place 1 patch onto the skin every 3 (three) days.   I have changed Katrina Velez's torsemide. I am also having her start on fentaNYL. Additionally, I am having her maintain her apixaban, diclofenac Sodium, Cardizem LA, metoprolol succinate, and Synthroid.  Meds ordered this encounter  Medications   torsemide (DEMADEX) 20 MG tablet    Sig: Take 1 tablet (20 mg total) by mouth daily.    Dispense:  90 tablet    Refill:  1   SYNTHROID 125 MCG tablet    Sig: Take 1 tablet (125 mcg total) by mouth daily.    Dispense:  90 tablet    Refill:  1   fentaNYL (DURAGESIC) 25 MCG/HR    Sig: Place 1 patch onto the skin every 3 (three) days.    Dispense:  10 patch    Refill:  0     Follow-up: Return in about 6 months (around 05/17/2022).  Sanda Linger, MD

## 2021-11-29 ENCOUNTER — Other Ambulatory Visit (HOSPITAL_COMMUNITY): Payer: Self-pay | Admitting: Nurse Practitioner

## 2021-12-19 ENCOUNTER — Other Ambulatory Visit: Payer: Self-pay | Admitting: Internal Medicine

## 2021-12-19 ENCOUNTER — Telehealth: Payer: Self-pay | Admitting: Internal Medicine

## 2021-12-19 DIAGNOSIS — M48062 Spinal stenosis, lumbar region with neurogenic claudication: Secondary | ICD-10-CM

## 2021-12-19 DIAGNOSIS — Z515 Encounter for palliative care: Secondary | ICD-10-CM

## 2021-12-19 MED ORDER — TRAMADOL HCL 50 MG PO TABS: 50.0000 mg | ORAL_TABLET | Freq: Four times a day (QID) | ORAL | 5 refills | Status: DC | PRN

## 2021-12-19 NOTE — Telephone Encounter (Signed)
Pt stated she was prescribed fentaNYL (DURAGESIC) 25 MCG/HR. She stated it did not help her. She stated she has also taken hydrocodone before and it did not help. Pt stated another provider prescribed her tramadol in the past. Pt stated the tramadol along with two tylenol was helpful to her. Pt would like to know if she should go back to tramadol, and if so could she get an rx for it.    Please advise

## 2021-12-24 ENCOUNTER — Other Ambulatory Visit (HOSPITAL_COMMUNITY): Payer: Self-pay | Admitting: Nurse Practitioner

## 2021-12-24 DIAGNOSIS — I4821 Permanent atrial fibrillation: Secondary | ICD-10-CM

## 2022-01-02 ENCOUNTER — Telehealth (HOSPITAL_COMMUNITY): Payer: Self-pay

## 2022-01-02 NOTE — Telephone Encounter (Signed)
Patient's daughter called in regarding her mom Brand name Cardizem medication. She is no longer able to find the brand name. I told her it is okay for her to take the generic name Cardizem. Consulted with patient's daughter and she verbalized understanding.

## 2022-02-23 ENCOUNTER — Telehealth: Payer: Self-pay

## 2022-02-23 NOTE — Telephone Encounter (Signed)
LVM to schedule an appointment for Katrina Velez.

## 2022-03-02 ENCOUNTER — Ambulatory Visit (INDEPENDENT_AMBULATORY_CARE_PROVIDER_SITE_OTHER): Payer: Medicare Other

## 2022-03-02 VITALS — BP 118/60 | HR 92 | Temp 97.3°F | Ht 62.0 in

## 2022-03-02 DIAGNOSIS — Z0000 Encounter for general adult medical examination without abnormal findings: Secondary | ICD-10-CM

## 2022-03-02 NOTE — Patient Instructions (Addendum)
Katrina Velez , Thank you for taking time to come for your Medicare Wellness Visit. I appreciate your ongoing commitment to your health goals. Please review the following plan we discussed and let me know if I can assist you in the future.   These are the goals we discussed:  Goals      Want to improve my walking and pain.        This is a list of the screening recommended for you and due dates:  Health Maintenance  Topic Date Due   COVID-19 Vaccine (5 - Pfizer risk series) 10/29/2020   Flu Shot  12/20/2021   Tetanus Vaccine  10/11/2026   Pneumonia Vaccine  Completed   DEXA scan (bone density measurement)  Completed   Zoster (Shingles) Vaccine  Completed   HPV Vaccine  Aged Out    Advanced directives: Yes, documents on  file.  Conditions/risks identified: Yes  Next appointment: Follow up in one year for your annual wellness visit.   Preventive Care 55 Years and Older, Female Preventive care refers to lifestyle choices and visits with your health care provider that can promote health and wellness. What does preventive care include? A yearly physical exam. This is also called an annual well check. Dental exams once or twice a year. Routine eye exams. Ask your health care provider how often you should have your eyes checked. Personal lifestyle choices, including: Daily care of your teeth and gums. Regular physical activity. Eating a healthy diet. Avoiding tobacco and drug use. Limiting alcohol use. Practicing safe sex. Taking low-dose aspirin every day. Taking vitamin and mineral supplements as recommended by your health care provider. What happens during an annual well check? The services and screenings done by your health care provider during your annual well check will depend on your age, overall health, lifestyle risk factors, and family history of disease. Counseling  Your health care provider may ask you questions about your: Alcohol use. Tobacco use. Drug  use. Emotional well-being. Home and relationship well-being. Sexual activity. Eating habits. History of falls. Memory and ability to understand (cognition). Work and work Statistician. Reproductive health. Screening  You may have the following tests or measurements: Height, weight, and BMI. Blood pressure. Lipid and cholesterol levels. These may be checked every 5 years, or more frequently if you are over 15 years old. Skin check. Lung cancer screening. You may have this screening every year starting at age 49 if you have a 30-pack-year history of smoking and currently smoke or have quit within the past 15 years. Fecal occult blood test (FOBT) of the stool. You may have this test every year starting at age 73. Flexible sigmoidoscopy or colonoscopy. You may have a sigmoidoscopy every 5 years or a colonoscopy every 10 years starting at age 36. Hepatitis C blood test. Hepatitis B blood test. Sexually transmitted disease (STD) testing. Diabetes screening. This is done by checking your blood sugar (glucose) after you have not eaten for a while (fasting). You may have this done every 1-3 years. Bone density scan. This is done to screen for osteoporosis. You may have this done starting at age 10. Mammogram. This may be done every 1-2 years. Talk to your health care provider about how often you should have regular mammograms. Talk with your health care provider about your test results, treatment options, and if necessary, the need for more tests. Vaccines  Your health care provider may recommend certain vaccines, such as: Influenza vaccine. This is recommended every year. Tetanus,  diphtheria, and acellular pertussis (Tdap, Td) vaccine. You may need a Td booster every 10 years. Zoster vaccine. You may need this after age 44. Pneumococcal 13-valent conjugate (PCV13) vaccine. One dose is recommended after age 3. Pneumococcal polysaccharide (PPSV23) vaccine. One dose is recommended after age  36. Talk to your health care provider about which screenings and vaccines you need and how often you need them. This information is not intended to replace advice given to you by your health care provider. Make sure you discuss any questions you have with your health care provider. Document Released: 06/04/2015 Document Revised: 01/26/2016 Document Reviewed: 03/09/2015 Elsevier Interactive Patient Education  2017 [employer].  Fall Prevention in the Home Falls can cause injuries. They can happen to people of all ages. There are many things you can do to make your home safe and to help prevent falls. What can I do on the outside of my home? Regularly fix the edges of walkways and driveways and fix any cracks. Remove anything that might make you trip as you walk through a door, such as a raised step or threshold. Trim any bushes or trees on the path to your home. Use bright outdoor lighting. Clear any walking paths of anything that might make someone trip, such as rocks or tools. Regularly check to see if handrails are loose or broken. Make sure that both sides of any steps have handrails. Any raised decks and porches should have guardrails on the edges. Have any leaves, snow, or ice cleared regularly. Use sand or salt on walking paths during winter. Clean up any spills in your garage right away. This includes oil or grease spills. What can I do in the bathroom? Use night lights. Install grab bars by the toilet and in the tub and shower. Do not use towel bars as grab bars. Use non-skid mats or decals in the tub or shower. If you need to sit down in the shower, use a plastic, non-slip stool. Keep the floor dry. Clean up any water that spills on the floor as soon as it happens. Remove soap buildup in the tub or shower regularly. Attach bath mats securely with double-sided non-slip rug tape. Do not have throw rugs and other things on the floor that can make you trip. What can I do in the  bedroom? Use night lights. Make sure that you have a light by your bed that is easy to reach. Do not use any sheets or blankets that are too big for your bed. They should not hang down onto the floor. Have a firm chair that has side arms. You can use this for support while you get dressed. Do not have throw rugs and other things on the floor that can make you trip. What can I do in the kitchen? Clean up any spills right away. Avoid walking on wet floors. Keep items that you use a lot in easy-to-reach places. If you need to reach something above you, use a strong step stool that has a grab bar. Keep electrical cords out of the way. Do not use floor polish or wax that makes floors slippery. If you must use wax, use non-skid floor wax. Do not have throw rugs and other things on the floor that can make you trip. What can I do with my stairs? Do not leave any items on the stairs. Make sure that there are handrails on both sides of the stairs and use them. Fix handrails that are broken or loose. Make  sure that handrails are as long as the stairways. Check any carpeting to make sure that it is firmly attached to the stairs. Fix any carpet that is loose or worn. Avoid having throw rugs at the top or bottom of the stairs. If you do have throw rugs, attach them to the floor with carpet tape. Make sure that you have a light switch at the top of the stairs and the bottom of the stairs. If you do not have them, ask someone to add them for you. What else can I do to help prevent falls? Wear shoes that: Do not have high heels. Have rubber bottoms. Are comfortable and fit you well. Are closed at the toe. Do not wear sandals. If you use a stepladder: Make sure that it is fully opened. Do not climb a closed stepladder. Make sure that both sides of the stepladder are locked into place. Ask someone to hold it for you, if possible. Clearly mark and make sure that you can see: Any grab bars or  handrails. First and last steps. Where the edge of each step is. Use tools that help you move around (mobility aids) if they are needed. These include: Canes. Walkers. Scooters. Crutches. Turn on the lights when you go into a dark area. Replace any light bulbs as soon as they burn out. Set up your furniture so you have a clear path. Avoid moving your furniture around. If any of your floors are uneven, fix them. If there are any pets around you, be aware of where they are. Review your medicines with your doctor. Some medicines can make you feel dizzy. This can increase your chance of falling. Ask your doctor what other things that you can do to help prevent falls. This information is not intended to replace advice given to you by your health care provider. Make sure you discuss any questions you have with your health care provider. Document Released: 03/04/2009 Document Revised: 10/14/2015 Document Reviewed: 06/12/2014 Elsevier Interactive Patient Education  2017 [employer].

## 2022-03-02 NOTE — Progress Notes (Addendum)
Subjective:   Katrina Velez is a 88 y.o. female who presents for Medicare Annual (Subsequent) preventive examination.  Review of Systems     Cardiac Risk Factors include: advanced age (>42men, >36 women);sedentary lifestyle     Objective:    Today's Vitals   03/02/22 1118  BP: 118/60  Pulse: 92  Temp: (!) 97.3 F (36.3 C)  SpO2: 99%  Height: 5\' 2"  (1.575 m)  PainSc: 10-Worst pain ever   Body mass index is 25.24 kg/m.     03/02/2022   11:18 AM 11/16/2020   10:00 AM 11/15/2020   11:58 PM 11/08/2020    9:48 AM 11/27/2019    3:02 PM 11/12/2019    3:32 PM 11/05/2019   10:35 AM  Advanced Directives  Does Patient Have a Medical Advance Directive? Yes Yes Yes Yes  Yes Yes  Type of Paramedic of [place];Living will [place];Living will  [place];Living will [place];Out of facility DNR (pink MOST or yellow form);Living will Living will;Healthcare Power of [place]  Does patient want to make changes to medical advance directive? No - Patient declined No - Patient declined  No - Patient declined No - Patient declined No - Patient declined No - Patient declined  Copy of [place] in Chart? Yes - validated most recent copy scanned in chart (See row information) Yes - validated most recent copy scanned in chart (See row information)  Yes - validated most recent copy scanned in chart (See row information) No - copy requested Yes - validated most recent copy scanned in chart (See row information) Yes - validated most recent copy scanned in chart (See row information)    Current Medications (verified) Outpatient Encounter Medications as of 03/02/2022  Medication Sig   CARDIZEM LA 120 MG 24 hr tablet TAKE 1 TABLET BY MOUTH TWICE A DAY   diclofenac Sodium (VOLTAREN) 1 % GEL Apply 2 g topically 4 (four) times daily.   ELIQUIS 5 MG TABS tablet TAKE 1 TABLET BY MOUTH  TWICE A DAY   metoprolol succinate (TOPROL-XL) 25 MG 24 hr tablet TAKE 1 TABLET BY MOUTH TWICE A DAY   SYNTHROID 125 MCG tablet Take 1 tablet (125 mcg total) by mouth daily.   torsemide (DEMADEX) 20 MG tablet Take 1 tablet (20 mg total) by mouth daily.   traMADol (ULTRAM) 50 MG tablet Take 1 tablet (50 mg total) by mouth every 6 (six) hours as needed.   No facility-administered encounter medications on file as of 03/02/2022.    Allergies (verified) Banana and Penicillins   History: Past Medical History:  Diagnosis Date   A-fib ([place]) 05/2015   HOSPITALIZED    Arthritis    Cataract    Hypothyroidism    Permanent atrial fibrillation ([place])    Thyroid disease    Past Surgical History:  Procedure Laterality Date   CARDIOVERSION N/A 06/08/2015   Procedure: CARDIOVERSION;  Surgeon: Jerline Pain, MD;  [facility]: [facility] ENDOSCOPY;  Service: Cardiovascular;  Laterality: N/A;   CARDIOVERSION N/A 09/02/2015   Procedure: CARDIOVERSION;  Surgeon: Herminio Commons, MD;  Location: [place];  Service: Cardiovascular;  Laterality: N/A;   CATARACT EXTRACTION, BILATERAL Bilateral 2010   JOINT REPLACEMENT     KNEE SURGERY  2002   ORIF FEMUR FRACTURE Right 11/18/2020   Procedure: OPEN REDUCTION INTERNAL FIXATION (ORIF) DISTAL FEMUR FRACTURE;  Surgeon: Shona Needles, MD;  Location: [place];  Service: Orthopedics;  Laterality: Right;   right hip replacement     RIGHT KNEE REPLACEMENT  2011   TEE WITHOUT CARDIOVERSION N/A 06/08/2015   Procedure: TRANSESOPHAGEAL ECHOCARDIOGRAM (TEE);  Surgeon: Jerline Pain, MD;  Location: [place];  Service: Cardiovascular;  Laterality: N/A;   TOTAL HIP ARTHROPLASTY Right 2011   TOTAL SHOULDER REPLACEMENT Right 2003   TOTAL SHOULDER REPLACEMENT Left 2007   WRIST SURGERY Bilateral 2008   both wrists   Family History  Problem Relation Age of Onset   Hypercalcemia Daughter    Hypertension Daughter    Social History   Socioeconomic History   Marital status:  Widowed    Spouse name: Not on file   Number of children: Not on file   Years of education: Not on file   Highest education level: Not on file  Occupational History   Not on file  Tobacco Use   Smoking status: Former    Packs/day: 1.00    Years: 65.00    Total pack years: 65.00    Types: Cigarettes    Quit date: 05/28/2011    Years since quitting: 10.7   Smokeless tobacco: Never  Vaping Use   Vaping Use: Former  Substance and Sexual Activity   Alcohol use: Yes    Alcohol/week: 1.0 - 2.0 standard drink of alcohol    Types: 1 - 2 Glasses of wine per week    Comment: occasional drink   Drug use: No   Sexual activity: Not on file  Other Topics Concern   Not on file  Social History Narrative   DIET: NONE      DO YOU DRINK/EAT THINGS WITH CAFFEINE: YES, COFFEE AND TEA       MARITAL STATUS: WIDOWED      WHAT YEAR WERE YOU MARRIED:1950      DO YOU LIVE IN A HOUSE, APARTMENT, ASSISTED LIVING, CONDO TRAILER ETC.: HOUSE       IS IT ONE OR MORE STORIES: 2 STORIES      HOW MANY PERSONS LIVE IN YOUR HOME: 1      DO YOU HAVE PETS IN YOUR HOME: 1 CAT AND A PART TIME DOG       CURRENT OR PAST PROFESSION: CIVIL SERVANT      DO YOU EXERCISE: NO       WHAT TYPE AND HOW OFTEN:NO   Social Determinants of Health   Financial Resource Strain: Low Risk  (03/02/2022)   Overall Financial Resource Strain (CARDIA)    Difficulty of Paying Living Expenses: Not hard at all  Food Insecurity: No Food Insecurity (03/02/2022)   Hunger Vital Sign    Worried About Running Out of Food in the Last Year: Never true    Ran Out of Food in the Last Year: Never true  Transportation Needs: No Transportation Needs (03/02/2022)   PRAPARE - Hydrologist (Medical): No    Lack of Transportation (Non-Medical): No  Physical Activity: Inactive (03/02/2022)   Exercise Vital Sign    Days of Exercise per Week: 0 days    Minutes of Exercise per Session: 0 min  Stress: No Stress  Concern Present (03/02/2022)   [place]    Feeling of Stress : Only a little  Social Connections: Socially Isolated (03/02/2022)   Social Connection and Isolation Panel [NHANES]    Frequency of Communication with Friends and Family: More than three times a week  Frequency of Social Gatherings with Friends and Family: More than three times a week    Attends Religious Services: Never    Database administratorActive Member of Clubs or Organizations: No    Attends BankerClub or Organization Meetings: Never    Marital Status: Widowed    Tobacco Counseling Counseling given: Not Answered   Clinical Intake:  Pre-visit preparation completed: Yes  Pain : 0-10 Pain Score: 10-Worst pain ever Pain Type: Chronic pain Pain Location: Leg Pain Orientation: Left Pain Descriptors / Indicators: Constant, Discomfort, Pins and needles, Stabbing, Throbbing, Tingling Pain Onset: More than a month ago Pain Frequency: Constant Pain Relieving Factors: none Effect of Pain on Daily Activities: Pain can diminish job performance, lower motivation to exercise and prevent you from completing daily tasks.  Pain produces disability and affects the quality of life.  Pain Relieving Factors: none  BMI - recorded: 25.24 (11/15/2021) Nutritional Status: BMI 25 -29 Overweight Nutritional Risks: None Diabetes: No  How often do you need to have someone help you when you read instructions, pamphlets, or other written materials from your doctor or pharmacy?: 1 - Never What is the last grade level you completed in school?: HSG  Diabetic? NO  Interpreter Needed?: No  Information entered by :: Susie CassetteShenika Sanaz Scarlett, LPN.   Activities of Daily Living    03/02/2022   11:55 AM  In your present state of health, do you have any difficulty performing the following activities:  Hearing? 1  Vision? 0  Difficulty concentrating or making decisions? 0  Walking or climbing stairs? 1   Dressing or bathing? 0  Doing errands, shopping? 1  Preparing Food and eating ? Y  Using the Toilet? N  In the past six months, have you accidently leaked urine? Y  Do you have problems with loss of bowel control? N  Managing your Medications? N  Managing your Finances? N  Housekeeping or managing your Housekeeping? Y    Patient Care Team: Etta GrandchildJones, Thomas L, MD as PCP - General (Internal Medicine) Sallye LatGroat, Christopher, MD as Consulting Physician (Ophthalmology) Newman Niparroll, Donna C, NP as Nurse Practitioner (Cardiology)  Indicate any recent Medical Services you may have received from other than [facility] providers in the past year (date may be approximate).     Assessment:   This is a routine wellness examination for Katrina Velez.  Hearing/Vision screen Hearing Screening - Comments:: Patient wears hearing aids. Vision Screening - Comments:: Wears rx glasses - up to date with routine eye exams with Sallye Lathristopher Groat, MD.   Dietary issues and exercise activities discussed: Current Exercise Habits: The patient does not participate in regular exercise at present, Exercise limited by: orthopedic condition(s);cardiac condition(s)   Goals Addressed             This Visit's Progress    Want to improve my walking and pain.        Depression Screen    03/02/2022   11:53 AM 08/15/2021   11:08 AM 11/27/2019    3:00 PM 11/05/2019   10:37 AM 07/09/2019    1:48 PM 01/15/2019    2:36 PM 10/04/2018   11:38 AM  PHQ 2/9 Scores  PHQ - 2 Score 0 0 0 0 0 0 0  PHQ- 9 Score  0         Fall Risk    03/02/2022   11:31 AM 08/15/2021   11:08 AM 05/20/2020    9:46 AM 11/27/2019    3:00 PM 11/12/2019    3:32 PM  Fall Risk  Falls in the past year? 0 0 0 0 0  Number falls in past yr: 0  0  0  Injury with Fall? 0  0 0 0  Risk for fall due to : No Fall Risks      Follow up Falls prevention discussed        FALL RISK PREVENTION PERTAINING TO THE HOME:  Any stairs in or around the home? No  If so, are there  any without handrails? No  Home free of loose throw rugs in walkways, pet beds, electrical cords, etc? Yes  Adequate lighting in your home to reduce risk of falls? Yes   ASSISTIVE DEVICES UTILIZED TO PREVENT FALLS:  Life alert? No  Use of a cane, walker or w/c? Yes  Grab bars in the bathroom? No  Shower chair or bench in shower? Yes  Elevated toilet seat or a handicapped toilet? Yes   TIMED UP AND GO:  Was the test performed? No . Patient in wheelchair  Cognitive Function:    10/01/2017   10:38 AM 09/29/2016   10:22 AM  MMSE - Mini Mental State Exam  Orientation to time 5 5  Orientation to Place 5 5  Registration 3 3  Attention/ Calculation 5 5  Recall 2 2  Language- name 2 objects 2 2  Language- repeat 1 1  Language- follow 3 step command 3 3  Language- read & follow direction 1 1  Write a sentence 1 1  Copy design 1 1  Total score 29 29        03/02/2022   11:56 AM 11/05/2019   10:37 AM 10/04/2018   11:45 AM  6CIT Screen  What Year? 0 points 0 points 0 points  What month? 0 points 0 points 0 points  What time? 0 points 0 points 0 points  Count back from 20 0 points 0 points 0 points  Months in reverse 0 points 0 points 0 points  Repeat phrase 0 points 2 points 2 points  Total Score 0 points 2 points 2 points    Immunizations Immunization History  Administered Date(s) Administered   Fluad Quad(high Dose 65+) 03/15/2019   Influenza Split 03/25/2012   Influenza, High Dose Seasonal PF 03/28/2018, 02/20/2020   Influenza,inj,Quad PF,6+ Mos 02/24/2013, 03/15/2015, 02/25/2016   Influenza-Unspecified 05/01/2014, 03/19/2017   PFIZER(Purple Top)SARS-COV-2 Vaccination 06/03/2019, 06/22/2019, 02/15/2020, 09/03/2020   Pneumococcal Conjugate-13 05/27/2014   Pneumococcal Polysaccharide-23 02/25/2016   Tdap 10/10/2016   Zoster Recombinat (Shingrix) 08/31/2017, 11/07/2017   Zoster, Live 05/22/2001    TDAP status: Up to date  Flu Vaccine status: Due, Education has been  provided regarding the importance of this vaccine. Advised may receive this vaccine at local pharmacy or Health Dept. Aware to provide a copy of the vaccination record if obtained from local pharmacy or Health Dept. Verbalized acceptance and understanding.  Pneumococcal vaccine status: Up to date  Covid-19 vaccine status: Completed vaccines  Qualifies for Shingles Vaccine? Yes   Zostavax completed Yes   Shingrix Completed?: Yes  Screening Tests Health Maintenance  Topic Date Due   COVID-19 Vaccine (5 - Pfizer risk series) 10/29/2020   INFLUENZA VACCINE  12/20/2021   TETANUS/TDAP  10/11/2026   Pneumonia Vaccine 30+ Years old  Completed   DEXA SCAN  Completed   Zoster Vaccines- Shingrix  Completed   HPV VACCINES  Aged Out    Health Maintenance  Health Maintenance Due  Topic Date Due   COVID-19 Vaccine (5 - Pfizer risk series) 10/29/2020  INFLUENZA VACCINE  12/20/2021    Colorectal cancer screening: No longer required.   Mammogram status: No longer required due to age.  Bone Density status: Never done  Lung Cancer Screening: (Low Dose CT Chest recommended if Age 14-80 years, 30 pack-year currently smoking OR have quit w/in 15years.) does not qualify.   Lung Cancer Screening Referral: no  Additional Screening:  Hepatitis C Screening: does not qualify; Completed no  Vision Screening: Recommended annual ophthalmology exams for early detection of glaucoma and other disorders of the eye. Is the patient up to date with their annual eye exam?  Yes  Who is the provider or what is the name of the office in which the patient attends annual eye exams? Warden Fillers, MD. If pt is not established with a provider, would they like to be referred to a provider to establish care? No .   Dental Screening: Recommended annual dental exams for proper oral hygiene  Community Resource Referral / Chronic Care Management: CRR required this visit?  No   CCM required this visit?  No       Plan:     I have personally reviewed and noted the following in the patient's chart:   Medical and social history Use of alcohol, tobacco or illicit drugs  Current medications and supplements including opioid prescriptions. Patient is not currently taking opioid prescriptions. Functional ability and status Nutritional status Physical activity Advanced directives List of other physicians Hospitalizations, surgeries, and ER visits in previous 12 months Vitals Screenings to include cognitive, depression, and falls Referrals and appointments  In addition, I have reviewed and discussed with patient certain preventive protocols, quality metrics, and best practice recommendations. A written personalized care plan for preventive services as well as general preventive health recommendations were provided to patient.     Sheral Flow, LPN   [id number]   Nurse Notes: N/A

## 2022-03-14 ENCOUNTER — Encounter: Payer: Self-pay | Admitting: Internal Medicine

## 2022-03-14 ENCOUNTER — Ambulatory Visit (INDEPENDENT_AMBULATORY_CARE_PROVIDER_SITE_OTHER): Payer: Medicare Other | Admitting: Internal Medicine

## 2022-03-14 VITALS — BP 122/72 | HR 69 | Temp 98.1°F | Ht 62.0 in

## 2022-03-14 DIAGNOSIS — Z23 Encounter for immunization: Secondary | ICD-10-CM

## 2022-03-14 DIAGNOSIS — I4821 Permanent atrial fibrillation: Secondary | ICD-10-CM | POA: Insufficient documentation

## 2022-03-14 DIAGNOSIS — E034 Atrophy of thyroid (acquired): Secondary | ICD-10-CM

## 2022-03-14 LAB — CBC WITH DIFFERENTIAL/PLATELET
Basophils Absolute: 0 10*3/uL (ref 0.0–0.1)
Basophils Relative: 0.5 % (ref 0.0–3.0)
Eosinophils Absolute: 0.1 10*3/uL (ref 0.0–0.7)
Eosinophils Relative: 1.3 % (ref 0.0–5.0)
HCT: 40.8 % (ref 36.0–46.0)
Hemoglobin: 13.3 g/dL (ref 12.0–15.0)
Lymphocytes Relative: 10 % — ABNORMAL LOW (ref 12.0–46.0)
Lymphs Abs: 0.7 10*3/uL (ref 0.7–4.0)
MCHC: 32.7 g/dL (ref 30.0–36.0)
MCV: 92.6 fl (ref 78.0–100.0)
Monocytes Absolute: 0.6 10*3/uL (ref 0.1–1.0)
Monocytes Relative: 7.7 % (ref 3.0–12.0)
Neutro Abs: 5.9 10*3/uL (ref 1.4–7.7)
Neutrophils Relative %: 80.5 % — ABNORMAL HIGH (ref 43.0–77.0)
Platelets: 293 10*3/uL (ref 150.0–400.0)
RBC: 4.41 Mil/uL (ref 3.87–5.11)
RDW: 14.2 % (ref 11.5–15.5)
WBC: 7.3 10*3/uL (ref 4.0–10.5)

## 2022-03-14 LAB — TSH: TSH: 0.64 u[IU]/mL (ref 0.35–5.50)

## 2022-03-14 LAB — HEPATIC FUNCTION PANEL
ALT: 7 U/L (ref 0–35)
AST: 15 U/L (ref 0–37)
Albumin: 4.1 g/dL (ref 3.5–5.2)
Alkaline Phosphatase: 79 U/L (ref 39–117)
Bilirubin, Direct: 0.2 mg/dL (ref 0.0–0.3)
Total Bilirubin: 0.6 mg/dL (ref 0.2–1.2)
Total Protein: 6.8 g/dL (ref 6.0–8.3)

## 2022-03-14 LAB — BASIC METABOLIC PANEL
BUN: 26 mg/dL — ABNORMAL HIGH (ref 6–23)
CO2: 33 mEq/L — ABNORMAL HIGH (ref 19–32)
Calcium: 9.9 mg/dL (ref 8.4–10.5)
Chloride: 96 mEq/L (ref 96–112)
Creatinine, Ser: 0.96 mg/dL (ref 0.40–1.20)
GFR: 50.61 mL/min — ABNORMAL LOW (ref >60.00)
Glucose, Bld: 90 mg/dL (ref 70–99)
Potassium: 3.8 mEq/L (ref 3.5–5.1)
Sodium: 135 mEq/L (ref 135–145)

## 2022-03-14 NOTE — Patient Instructions (Signed)

## 2022-03-14 NOTE — Progress Notes (Signed)
Subjective:  Patient ID: Katrina Velez, female    DOB: Sep 16, 1925  Age: 88 y.o. MRN: [id number]  CC: Osteoarthritis, Hypothyroidism, and Atrial Fibrillation   HPI Katrina Velez presents for f/up -  She continues to c/o pain in her hips and knees.  Outpatient Medications Prior to Visit  Medication Sig Dispense Refill   CARDIZEM LA 120 MG 24 hr tablet TAKE 1 TABLET BY MOUTH TWICE A DAY 60 tablet 6   ELIQUIS 5 MG TABS tablet TAKE 1 TABLET BY MOUTH TWICE A DAY 180 tablet 3   metoprolol succinate (TOPROL-XL) 25 MG 24 hr tablet TAKE 1 TABLET BY MOUTH TWICE A DAY 180 tablet 1   SYNTHROID 125 MCG tablet Take 1 tablet (125 mcg total) by mouth daily. 90 tablet 1   torsemide (DEMADEX) 20 MG tablet Take 1 tablet (20 mg total) by mouth daily. 90 tablet 1   traMADol (ULTRAM) 50 MG tablet Take 1 tablet (50 mg total) by mouth every 6 (six) hours as needed. 120 tablet 5   diclofenac Sodium (VOLTAREN) 1 % GEL Apply 2 g topically 4 (four) times daily. 350 g 2   No facility-administered medications prior to visit.    ROS Review of Systems  Constitutional:  Negative for chills, diaphoresis, fatigue and fever.  HENT: Negative.    Eyes: Negative.   Respiratory:  Negative for cough, shortness of breath and wheezing.   Cardiovascular:  Negative for chest pain, palpitations and leg swelling.  Gastrointestinal:  Negative for abdominal pain, constipation, diarrhea, nausea and vomiting.  Endocrine: Negative.   Genitourinary: Negative.   Musculoskeletal:  Positive for arthralgias. Negative for back pain and myalgias.  Skin: Negative.   Neurological: Negative.  Negative for dizziness and weakness.  Hematological:  Negative for adenopathy. Does not bruise/bleed easily.  Psychiatric/Behavioral: Negative.      Objective:  BP 122/72 (BP Location: Left Arm, Patient Position: Sitting, Cuff Size: Large)   Pulse 69   Temp 98.1 F (36.7 C) (Oral)   Ht 5\' 2"  (1.575 m)   SpO2 98%   BMI 25.24 kg/m   BP  Readings from Last 3 Encounters:  03/14/22 122/72  03/02/22 118/60  11/15/21 110/70    Wt Readings from Last 3 Encounters:  10/12/21 138 lb (62.6 kg)  08/15/21 141 lb (64 kg)  04/19/21 144 lb 12.8 oz (65.7 kg)    Physical Exam Vitals reviewed.  HENT:     Mouth/Throat:     Mouth: Mucous membranes are moist.  Eyes:     General: No scleral icterus.    Conjunctiva/sclera: Conjunctivae normal.  Cardiovascular:     Rate and Rhythm: Normal rate. Rhythm irregularly irregular.     Heart sounds: No murmur heard. Pulmonary:     Effort: Pulmonary effort is normal.     Breath sounds: No stridor. No wheezing, rhonchi or rales.  Abdominal:     General: Abdomen is flat.     Palpations: There is no mass.     Tenderness: There is no abdominal tenderness. There is no guarding.     Hernia: No hernia is present.  Musculoskeletal:        General: No swelling.     Cervical back: Neck supple.     Right lower leg: No edema.     Left lower leg: No edema.  Skin:    General: Skin is warm and dry.  Neurological:     General: No focal deficit present.     Mental Status: She is  alert.  Psychiatric:        Mood and Affect: Mood normal.        Behavior: Behavior normal.     Lab Results  Component Value Date   WBC 7.3 03/14/2022   HGB 13.3 03/14/2022   HCT 40.8 03/14/2022   PLT 293.0 03/14/2022   GLUCOSE 90 03/14/2022   CHOL 193 05/27/2014   TRIG 107 05/27/2014   HDL 66 05/27/2014   LDLCALC 106 (H) 05/27/2014   ALT 7 03/14/2022   AST 15 03/14/2022   NA 135 03/14/2022   K 3.8 03/14/2022   CL 96 03/14/2022   CREATININE 0.96 03/14/2022   BUN 26 (H) 03/14/2022   CO2 33 (H) 03/14/2022   TSH 0.64 03/14/2022   INR 1.6 (H) 11/16/2020    No results found.  Assessment & Plan:   Katrina Velez was seen today for osteoarthritis, hypothyroidism and atrial fibrillation.  Diagnoses and all orders for this visit:  Hypothyroidism due to acquired atrophy of thyroid- She is euthyroid. -     Basic  metabolic panel; Future -     TSH; Future -     Hepatic function panel; Future -     CBC with Differential/Platelet; Future -     CBC with Differential/Platelet -     Hepatic function panel -     TSH -     Basic metabolic panel  Permanent atrial fibrillation (HCC)-  She has good rate control. -     Basic metabolic panel; Future -     TSH; Future -     Hepatic function panel; Future -     CBC with Differential/Platelet; Future -     CBC with Differential/Platelet -     Hepatic function panel -     TSH -     Basic metabolic panel  Flu vaccine need -     Flu Vaccine QUAD High Dose(Fluad)  Other orders -     Pneumococcal polysaccharide vaccine 23-valent greater than or equal to 2yo subcutaneous/IM   I have discontinued Katrina Velez's diclofenac Sodium. I am also having her maintain her metoprolol succinate, torsemide, Synthroid, Eliquis, traMADol, and Cardizem LA.  No orders of the defined types were placed in this encounter.    Follow-up: Return in about 6 months (around 09/13/2022).  Sanda Linger, MD

## 2022-03-22 DIAGNOSIS — Z23 Encounter for immunization: Secondary | ICD-10-CM | POA: Diagnosis not present

## 2022-04-06 ENCOUNTER — Other Ambulatory Visit: Payer: Self-pay | Admitting: Internal Medicine

## 2022-04-06 DIAGNOSIS — R600 Localized edema: Secondary | ICD-10-CM

## 2022-04-06 DIAGNOSIS — E034 Atrophy of thyroid (acquired): Secondary | ICD-10-CM

## 2022-04-20 ENCOUNTER — Encounter (HOSPITAL_COMMUNITY): Payer: Self-pay | Admitting: Nurse Practitioner

## 2022-04-20 ENCOUNTER — Ambulatory Visit (HOSPITAL_COMMUNITY)
Admission: RE | Admit: 2022-04-20 | Discharge: 2022-04-20 | Disposition: A | Discharge status: Home / Self Care | Payer: Medicare Other | Source: Ambulatory Visit | Attending: Nurse Practitioner | Admitting: Nurse Practitioner

## 2022-04-20 VITALS — BP 98/54 | HR 89 | Ht 62.0 in | Wt 138.8 lb

## 2022-04-20 DIAGNOSIS — D6869 Other thrombophilia: Secondary | ICD-10-CM | POA: Diagnosis not present

## 2022-04-20 DIAGNOSIS — Z7901 Long term (current) use of anticoagulants: Secondary | ICD-10-CM | POA: Diagnosis not present

## 2022-04-20 DIAGNOSIS — Z79899 Other long term (current) drug therapy: Secondary | ICD-10-CM | POA: Diagnosis not present

## 2022-04-20 DIAGNOSIS — I4821 Permanent atrial fibrillation: Secondary | ICD-10-CM | POA: Insufficient documentation

## 2022-04-20 NOTE — Progress Notes (Signed)
Patient ID: Katrina Velez, female   DOB: 1926-02-04, 88 y.o.   MRN: [id number]       Primary Care Physician: Etta Grandchild, MD Referring Physician: Dr. Laqueta Jean Normoyle is a 88 y.o. female with a h/o afib that was loaded on amiodarone and successfully cardioverted 4/13, but with ERAF. She did not see that much difference in SR and had some thyroid concerns  already on replacement, so decision was made to stop amiodarone and aim for rate control,HR has been staying below 100 at home. Her energy is good for her age and she is back to hanging clothes outside. No issues with anticoagulant.   Returns to afib clinic 8/23, after she has noticed at home some fluctuation in heart rate. Her daughter on taking her heart rate and Bp one day, noticed that hr heart rate was 48 bpm, although the pt did not feel bad. She talked to the on call provider and was advised to skip the next rate control drug. A few days later, the pt had a nonsustained episode of rvr at 150 bpm. She did feel lightheaded with that episode. On call provider was contacted and she was told to how to take extra rate control if needed. However after a few more minutes, her HR slowed down.  EKG showed afib with controlled afib. She is also upset because she feels that she needs more thyroid replacement. She has also felt better on a higher dose than what the labs suggest that she takes. Her synthroid was reduced several months back and the pt is c/o thinning hair, thinning nails, more fatigue, depression. She is planning to change to a new MD in the first of October to see if she can get straightened out with her thyroid.   Returns 9/6, holter monitor placed on previous visit and reviewed. Shows average HR around 107 bpm, high rate 160 bpm, minimum 40 bpm at 4:30 am. Pt was not aware of fast or slow heart rate.She could benefit from more rate control. She is still able to do her house work with minimal symptoms and feels that she has good rate  control.   F/u in the afib clinic 10/25 and has good rate control. Currently is on metoprolol ER 25 mg bid and cardizem 120 mg bid. Tried higher doses of BB but could not tolerate. She is tolerating this combination of rate control with HR in the 80's.   F/u in afib clinic,4/25, she is feeling well. Does not feel afib . No issues with shortness of breath or pedal edema. No bleeding issues with eliquis.  F/u in fib clinic, 09/20/17. She has now turned 89 and is doing well. She did lacerate her leg on a patio chair about 6 weeks  Ago and did need stitches. It is healing but a large dark scab is still present. Otherwise, no complaints.  F/u in afib clinic, 03/28/18. She is doing very well. No complaints. It took the area on her leg described above, 6 months to heal and eventually this was done with the help of the would clinic. She is now 89 and going strong. Her renal panel for June showed a creatinine of 0.84 and with her current weight, she still qualifies for 5 mg eliquis bid. No bleeding issues.  F/u in afib clinic, 03/25/19. She continues in rate controlled afib.She did have a fall at home  and went to the ER with bruising and a couple of stitches.No major trauma. She  is now walking with a cane in the house and has a fall alert bracelet now. Does not know why she fell, just  got off balance.    F/u 09/23/19 in afib clinic. She feels well. Relieved to have has her covid shots so she can be more active. She and her daughter went to the beach in April and she really enjoyed the change in scenery. Her afib is rate controlled and she does not feel it. She  had a major fall last fall, mostly bruising, but no further reports of fall. Walks with a cane.     F/u in afib clinic, 03/22/20. Overall no  issues with afib, she is rate controlled   at home in the 80's/90's. Her biggest concern is that she  feels she needs at least 125 mcg levothyroxine to feel her best. This  has been her dose for years and when she  tries to reduce it, she feels terrible. She states that Dr. Neva Seat  before he retired would listen to her and her reason for the higher dose, not go strictly by lab values,  but her last few  physician's want to go strictly by the TSH. I explained to her that the lab  values are what is used to adjust meds but she feels it should be how she feels. She was recommended by Dr. Renato Gails to reduce to 112 mcg daily but she is taking an higher dose than  this, having older med doses  around the house, I believe she indicated she was taking 125 mcg daily.  F/u in afib clinic, 09/30/20. She remains in permanent afib with controlled ventricular rate. No recent falls or other complaints today. Her weight has declined but still above 60 kgs (61.8 kgs) and creatinine in less than 1.5, so  she will continue on eliquis 5 mg bid. No bleeding issues.   F/u in afib clinic, 04/19/21. She remains in rate controlled permament afib. Since last visit, she had a mechanical fall and had surgery for Rt  femur fracture. She had surgery without any complications. She is planing a new PCP visit in December.   F/u in afib clinic, 10/12/21. She remains in rate controlled afib she is having a lot of pain in her left knee and hip and is not felt to be a surgical candidate. She is pending an appointment with the pain clinic in the future.   F/u in afib clinic, 04/20/22. She remains at her baseline, rate controlled permanent afib. No falls. She is here with her daughter. Soft BP this am, has not drank much water this am. Overall, no concerns.   Today, she denies symptoms of palpitations, chest pain, shortness of breath, orthopnea, PND, lower extremity edema, dizziness, presyncope, syncope, or neurologic sequela. The patient is tolerating medications without difficulties and is otherwise without complaint today.   Past Medical History:  Diagnosis Date   A-fib (HCC) 05/2015   HOSPITALIZED    Arthritis    Cataract    Hypothyroidism     Permanent atrial fibrillation (HCC)    Thyroid disease    Past Surgical History:  Procedure Laterality Date   CARDIOVERSION N/A 06/08/2015   Procedure: CARDIOVERSION;  Surgeon: Jake Bathe, MD;  Location: [facility] ENDOSCOPY;  Service: Cardiovascular;  Laterality: N/A;   CARDIOVERSION N/A 09/02/2015   Procedure: CARDIOVERSION;  Surgeon: Laqueta Linden, MD;  Location: [facility] ENDOSCOPY;  Service: Cardiovascular;  Laterality: N/A;   CATARACT EXTRACTION, BILATERAL Bilateral 2010   JOINT REPLACEMENT  KNEE SURGERY  2002   ORIF FEMUR FRACTURE Right 11/18/2020   Procedure: OPEN REDUCTION INTERNAL FIXATION (ORIF) DISTAL FEMUR FRACTURE;  Surgeon: Roby Lofts, MD;  Location: [facility] OR;  Service: Orthopedics;  Laterality: Right;   right hip replacement     RIGHT KNEE REPLACEMENT  2011   TEE WITHOUT CARDIOVERSION N/A 06/08/2015   Procedure: TRANSESOPHAGEAL ECHOCARDIOGRAM (TEE);  Surgeon: Jake Bathe, MD;  Location: [facility] ENDOSCOPY;  Service: Cardiovascular;  Laterality: N/A;   TOTAL HIP ARTHROPLASTY Right 2011   TOTAL SHOULDER REPLACEMENT Right 2003   TOTAL SHOULDER REPLACEMENT Left 2007   WRIST SURGERY Bilateral 2008   both wrists    Current Outpatient Medications  Medication Sig Dispense Refill   CARDIZEM LA 120 MG 24 hr tablet TAKE 1 TABLET BY MOUTH TWICE A DAY 60 tablet 6   ELIQUIS 5 MG TABS tablet TAKE 1 TABLET BY MOUTH TWICE A DAY 180 tablet 3   metoprolol succinate (TOPROL-XL) 25 MG 24 hr tablet TAKE 1 TABLET BY MOUTH TWICE A DAY 180 tablet 1   SYNTHROID 125 MCG tablet TAKE 1 TABLET BY MOUTH EVERY DAY 90 tablet 1   torsemide (DEMADEX) 20 MG tablet TAKE 1 TABLET BY MOUTH EVERY DAY 90 tablet 1   traMADol (ULTRAM) 50 MG tablet Take 1 tablet (50 mg total) by mouth every 6 (six) hours as needed. 120 tablet 5   No current facility-administered medications for this encounter.    Allergies  Allergen Reactions   Banana Nausea Only    All banana products also   Penicillins Hives    Social  History   Socioeconomic History   Marital status: Widowed    Spouse name: Not on file   Number of children: Not on file   Years of education: Not on file   Highest education level: Not on file  Occupational History   Not on file  Tobacco Use   Smoking status: Former    Packs/day: 1.00    Years: 65.00    Total pack years: 65.00    Types: Cigarettes    Quit date: 05/28/2011    Years since quitting: 10.9   Smokeless tobacco: Never  Vaping Use   Vaping Use: Former  Substance and Sexual Activity   Alcohol use: Yes    Alcohol/week: 1.0 - 2.0 standard drink of alcohol    Types: 1 - 2 Glasses of wine per week    Comment: occasional drink   Drug use: No   Sexual activity: Not on file  Other Topics Concern   Not on file  Social History Narrative   DIET: NONE      DO YOU DRINK/EAT THINGS WITH CAFFEINE: YES, COFFEE AND TEA       MARITAL STATUS: WIDOWED      WHAT YEAR WERE YOU MARRIED:1950      DO YOU LIVE IN A HOUSE, APARTMENT, ASSISTED LIVING, CONDO TRAILER ETC.: HOUSE       IS IT ONE OR MORE STORIES: 2 STORIES      HOW MANY PERSONS LIVE IN YOUR HOME: 1      DO YOU HAVE PETS IN YOUR HOME: 1 CAT AND A PART TIME DOG       CURRENT OR PAST PROFESSION: CIVIL SERVANT      DO YOU EXERCISE: NO       WHAT TYPE AND HOW OFTEN:NO   Social Determinants of Health   Financial Resource Strain: Low Risk  (03/02/2022)   Overall Financial Resource  Strain (CARDIA)    Difficulty of Paying Living Expenses: Not hard at all  Food Insecurity: No Food Insecurity (03/02/2022)   Hunger Vital Sign    Worried About Running Out of Food in the Last Year: Never true    Ran Out of Food in the Last Year: Never true  Transportation Needs: No Transportation Needs (03/02/2022)   PRAPARE - Administrator, Civil ServiceTransportation    Lack of Transportation (Medical): No    Lack of Transportation (Non-Medical): No  Physical Activity: Inactive (03/02/2022)   Exercise Vital Sign    Days of Exercise per Week: 0 days    Minutes of  Exercise per Session: 0 min  Stress: No Stress Concern Present (03/02/2022)   [employer] - Occupational Stress Questionnaire    Feeling of Stress : Only a little  Social Connections: Socially Isolated (03/02/2022)   Social Connection and Isolation Panel [NHANES]    Frequency of Communication with Friends and Family: More than three times a week    Frequency of Social Gatherings with Friends and Family: More than three times a week    Attends Religious Services: Never    Database administratorActive Member of Clubs or Organizations: No    Attends BankerClub or Organization Meetings: Never    Marital Status: Widowed  Intimate Partner Violence: Not At Risk (03/02/2022)   Humiliation, Afraid, Rape, and Kick questionnaire    Fear of Current or Ex-Partner: No    Emotionally Abused: No    Physically Abused: No    Sexually Abused: No    Family History  Problem Relation Age of Onset   Hypercalcemia Daughter    Hypertension Daughter     ROS- All systems are reviewed and negative except as per the HPI above  Physical Exam: Vitals:   04/20/22 1131  BP: (!) 98/54  Pulse: 89  Weight: 63 kg  Height: 5\' 2"  (1.575 m)    GEN- The patient is well appearing, alert and oriented x 3 today.   Head- normocephalic, atraumatic Eyes-  Sclera clear, conjunctiva pink Ears- hearing intact Oropharynx- clear Neck- supple, no JVP Lymph- no cervical lymphadenopathy Lungs- Clear to ausculation bilaterally, normal work of breathing Heart- Irregular rate and rhythm, no murmurs, rubs or gallops, PMI not laterally displaced GI- soft, NT, ND, + BS Extremities- no clubbing, cyanosis, or edema MS- no significant deformity or atrophy Skin- no rash or lesion Psych- euthymic mood, full affect Neuro- strength and sensation are intact  EKG- afib at 89 bpm, qrs int 82 ms, qtc 442 ms  Epic records reviewed   Assessment and Plan: 1.Long standing permanent afib Rate controlled Continue metoprolol ER 25 mg  bid and cardizem 120 mg bid, appears to have HR well controlled with these doses Continue eliquis 5 mg bid, appropriately dosed at this time, (62.7 kgs)but if she should lose several lbs lbs will have to lower the dose to 2.5 mg bid, creatinine 0.96 03/14/22 Her daughter states that her weight is pretty much stable   F/u 6 months   Lupita LeashDonna C. Matthew Folksarroll, ANP-C [facility] [address] [place], [place] [address] [phone]

## 2022-05-13 ENCOUNTER — Other Ambulatory Visit (HOSPITAL_COMMUNITY): Payer: Self-pay | Admitting: Nurse Practitioner

## 2022-05-13 DIAGNOSIS — I4821 Permanent atrial fibrillation: Secondary | ICD-10-CM

## 2022-05-22 ENCOUNTER — Other Ambulatory Visit (HOSPITAL_COMMUNITY): Payer: Self-pay | Admitting: Nurse Practitioner

## 2022-05-22 DIAGNOSIS — I4821 Permanent atrial fibrillation: Secondary | ICD-10-CM

## 2022-06-29 ENCOUNTER — Encounter (HOSPITAL_COMMUNITY): Payer: Self-pay | Admitting: *Deleted

## 2022-08-08 ENCOUNTER — Other Ambulatory Visit: Payer: Self-pay | Admitting: Internal Medicine

## 2022-08-08 DIAGNOSIS — Z515 Encounter for palliative care: Secondary | ICD-10-CM

## 2022-08-08 DIAGNOSIS — M48062 Spinal stenosis, lumbar region with neurogenic claudication: Secondary | ICD-10-CM

## 2022-10-04 ENCOUNTER — Ambulatory Visit (HOSPITAL_COMMUNITY)
Admission: RE | Admit: 2022-10-04 | Discharge: 2022-10-04 | Disposition: A | Discharge status: Home / Self Care | Payer: Medicare Other | Source: Ambulatory Visit | Attending: Internal Medicine | Admitting: Internal Medicine

## 2022-10-04 VITALS — BP 108/56 | HR 89 | Ht 62.0 in | Wt 136.0 lb

## 2022-10-04 DIAGNOSIS — Z7901 Long term (current) use of anticoagulants: Secondary | ICD-10-CM | POA: Diagnosis not present

## 2022-10-04 DIAGNOSIS — I4821 Permanent atrial fibrillation: Secondary | ICD-10-CM

## 2022-10-04 DIAGNOSIS — D6869 Other thrombophilia: Secondary | ICD-10-CM | POA: Diagnosis not present

## 2022-10-04 DIAGNOSIS — Z87891 Personal history of nicotine dependence: Secondary | ICD-10-CM | POA: Insufficient documentation

## 2022-10-04 NOTE — Progress Notes (Signed)
Patient ID: Katrina Velez, female   DOB: 05-26-1925, 89 y.o.   MRN: [id number]       Primary Care Physician: Etta Grandchild, MD Referring Physician: Dr. Laqueta Jean Vidrio is a 89 y.o. female with a h/o afib that was loaded on amiodarone and successfully cardioverted 4/13, but with ERAF. She did not see that much difference in SR and had some thyroid concerns  already on replacement, so decision was made to stop amiodarone and aim for rate control,HR has been staying below 100 at home. Her energy is good for her age and she is back to hanging clothes outside. No issues with anticoagulant.   Returns to afib clinic 8/23, after she has noticed at home some fluctuation in heart rate. Her daughter on taking her heart rate and Bp one day, noticed that hr heart rate was 48 bpm, although the pt did not feel bad. She talked to the on call provider and was advised to skip the next rate control drug. A few days later, the pt had a nonsustained episode of rvr at 150 bpm. She did feel lightheaded with that episode. On call provider was contacted and she was told to how to take extra rate control if needed. However after a few more minutes, her HR slowed down.  EKG showed afib with controlled afib. She is also upset because she feels that she needs more thyroid replacement. She has also felt better on a higher dose than what the labs suggest that she takes. Her synthroid was reduced several months back and the pt is c/o thinning hair, thinning nails, more fatigue, depression. She is planning to change to a new MD in the first of October to see if she can get straightened out with her thyroid.   Returns 9/6, holter monitor placed on previous visit and reviewed. Shows average HR around 107 bpm, high rate 160 bpm, minimum 40 bpm at 4:30 am. Pt was not aware of fast or slow heart rate.She could benefit from more rate control. She is still able to do her house work with minimal symptoms and feels that she has good rate  control.   F/u in the afib clinic 10/25 and has good rate control. Currently is on metoprolol ER 25 mg bid and cardizem 120 mg bid. Tried higher doses of BB but could not tolerate. She is tolerating this combination of rate control with HR in the 80's.   F/u in afib clinic,4/25, she is feeling well. Does not feel afib . No issues with shortness of breath or pedal edema. No bleeding issues with eliquis.  F/u in fib clinic, 09/20/17. She has now turned 89 and is doing well. She did lacerate her leg on a patio chair about 6 weeks  Ago and did need stitches. It is healing but a large dark scab is still present. Otherwise, no complaints.  F/u in afib clinic, 03/28/18. She is doing very well. No complaints. It took the area on her leg described above, 6 months to heal and eventually this was done with the help of the would clinic. She is now 89 and going strong. Her renal panel for June showed a creatinine of 0.84 and with her current weight, she still qualifies for 5 mg eliquis bid. No bleeding issues.  F/u in afib clinic, 03/25/19. She continues in rate controlled afib.She did have a fall at home  and went to the ER with bruising and a couple of stitches.No major trauma. She  is now walking with a cane in the house and has a fall alert bracelet now. Does not know why she fell, just  got off balance.    F/u 09/23/19 in afib clinic. She feels well. Relieved to have has her covid shots so she can be more active. She and her daughter went to the beach in April and she really enjoyed the change in scenery. Her afib is rate controlled and she does not feel it. She  had a major fall last fall, mostly bruising, but no further reports of fall. Walks with a cane.     F/u in afib clinic, 03/22/20. Overall no  issues with afib, she is rate controlled   at home in the 80's/90's. Her biggest concern is that she  feels she needs at least 125 mcg levothyroxine to feel her best. This  has been her dose for years and when she  tries to reduce it, she feels terrible. She states that Dr. Neva Seat  before he retired would listen to her and her reason for the higher dose, not go strictly by lab values,  but her last few  physician's want to go strictly by the TSH. I explained to her that the lab  values are what is used to adjust meds but she feels it should be how she feels. She was recommended by Dr. Renato Gails to reduce to 112 mcg daily but she is taking an higher dose than  this, having older med doses  around the house, I believe she indicated she was taking 125 mcg daily.  F/u in afib clinic, 09/30/20. She remains in permanent afib with controlled ventricular rate. No recent falls or other complaints today. Her weight has declined but still above 60 kgs (61.8 kgs) and creatinine in less than 1.5, so  she will continue on eliquis 5 mg bid. No bleeding issues.   F/u in afib clinic, 04/19/21. She remains in rate controlled permament afib. Since last visit, she had a mechanical fall and had surgery for Rt  femur fracture. She had surgery without any complications. She is planing a new PCP visit in December.   F/u in afib clinic, 10/12/21. She remains in rate controlled afib she is having a lot of pain in her left knee and hip and is not felt to be a surgical candidate. She is pending an appointment with the pain clinic in the future.   F/u in afib clinic, 04/20/22. She remains at her baseline, rate controlled permanent afib. No falls. She is here with her daughter. Soft BP this am, has not drank much water this am. Overall, no concerns.   F/u in Afib clinic, 10/04/22. She remains at her baseline in rate controlled permanent Afib. She is here with her son. She is doing well overall, but chief complaint is chronic left leg pain.    Today, she denies symptoms of palpitations, chest pain, shortness of breath, orthopnea, PND, lower extremity edema, dizziness, presyncope, syncope, or neurologic sequela. The patient is tolerating medications  without difficulties and is otherwise without complaint today.   Past Medical History:  Diagnosis Date   A-fib (HCC) 05/2015   HOSPITALIZED    Arthritis    Cataract    Hypothyroidism    Permanent atrial fibrillation (HCC)    Thyroid disease    Past Surgical History:  Procedure Laterality Date   CARDIOVERSION N/A 06/08/2015   Procedure: CARDIOVERSION;  Surgeon: Jake Bathe, MD;  Location: [facility] ENDOSCOPY;  Service: Cardiovascular;  Laterality:  N/A;   CARDIOVERSION N/A 09/02/2015   Procedure: CARDIOVERSION;  Surgeon: Laqueta Linden, MD;  Location: [facility] ENDOSCOPY;  Service: Cardiovascular;  Laterality: N/A;   CATARACT EXTRACTION, BILATERAL Bilateral 2010   JOINT REPLACEMENT     KNEE SURGERY  2002   ORIF FEMUR FRACTURE Right 11/18/2020   Procedure: OPEN REDUCTION INTERNAL FIXATION (ORIF) DISTAL FEMUR FRACTURE;  Surgeon: Roby Lofts, MD;  Location: [facility] OR;  Service: Orthopedics;  Laterality: Right;   right hip replacement     RIGHT KNEE REPLACEMENT  2011   TEE WITHOUT CARDIOVERSION N/A 06/08/2015   Procedure: TRANSESOPHAGEAL ECHOCARDIOGRAM (TEE);  Surgeon: Jake Bathe, MD;  Location: [facility] ENDOSCOPY;  Service: Cardiovascular;  Laterality: N/A;   TOTAL HIP ARTHROPLASTY Right 2011   TOTAL SHOULDER REPLACEMENT Right 2003   TOTAL SHOULDER REPLACEMENT Left 2007   WRIST SURGERY Bilateral 2008   both wrists    Current Outpatient Medications  Medication Sig Dispense Refill   diltiazem (CARDIZEM LA) 120 MG 24 hr tablet TAKE 1 TABLET BY MOUTH TWICE A DAY [BRAND NOT AVAILABLE PT WILL USE GENERIC] 180 tablet 2   ELIQUIS 5 MG TABS tablet TAKE 1 TABLET BY MOUTH TWICE A DAY 180 tablet 3   metoprolol succinate (TOPROL-XL) 25 MG 24 hr tablet TAKE 1 TABLET BY MOUTH TWICE A DAY 180 tablet 1   SYNTHROID 125 MCG tablet TAKE 1 TABLET BY MOUTH EVERY DAY 90 tablet 1   torsemide (DEMADEX) 20 MG tablet TAKE 1 TABLET BY MOUTH EVERY DAY 90 tablet 1   traMADol (ULTRAM) 50 MG tablet TAKE 1 TABLET BY MOUTH  EVERY 6 HOURS AS NEEDED. 120 tablet 2   No current facility-administered medications for this encounter.    Allergies  Allergen Reactions   Banana Nausea Only    All banana products also   Penicillins Hives    Social History   Socioeconomic History   Marital status: Widowed    Spouse name: Not on file   Number of children: Not on file   Years of education: Not on file   Highest education level: Not on file  Occupational History   Not on file  Tobacco Use   Smoking status: Former    Packs/day: 1.00    Years: 65.00    Additional pack years: 0.00    Total pack years: 65.00    Types: Cigarettes    Quit date: 05/28/2011    Years since quitting: 11.3   Smokeless tobacco: Never  Vaping Use   Vaping Use: Former  Substance and Sexual Activity   Alcohol use: Yes    Alcohol/week: 1.0 - 2.0 standard drink of alcohol    Types: 1 - 2 Glasses of wine per week    Comment: occasional drink   Drug use: No   Sexual activity: Not on file  Other Topics Concern   Not on file  Social History Narrative   DIET: NONE      DO YOU DRINK/EAT THINGS WITH CAFFEINE: YES, COFFEE AND TEA       MARITAL STATUS: WIDOWED      WHAT YEAR WERE YOU MARRIED:1950      DO YOU LIVE IN A HOUSE, APARTMENT, ASSISTED LIVING, CONDO TRAILER ETC.: HOUSE       IS IT ONE OR MORE STORIES: 2 STORIES      HOW MANY PERSONS LIVE IN YOUR HOME: 1      DO YOU HAVE PETS IN YOUR HOME: 1 CAT AND A PART TIME DOG  CURRENT OR PAST PROFESSION: CIVIL SERVANT      DO YOU EXERCISE: NO       WHAT TYPE AND HOW OFTEN:NO   Social Determinants of Health   Financial Resource Strain: Low Risk  (03/02/2022)   Overall Financial Resource Strain (CARDIA)    Difficulty of Paying Living Expenses: Not hard at all  Food Insecurity: No Food Insecurity (03/02/2022)   Hunger Vital Sign    Worried About Running Out of Food in the Last Year: Never true    Ran Out of Food in the Last Year: Never true  Transportation Needs: No  Transportation Needs (03/02/2022)   PRAPARE - Administrator, Civil Service (Medical): No    Lack of Transportation (Non-Medical): No  Physical Activity: Inactive (03/02/2022)   Exercise Vital Sign    Days of Exercise per Week: 0 days    Minutes of Exercise per Session: 0 min  Stress: No Stress Concern Present (03/02/2022)   [employer] of Occupational Health - Occupational Stress Questionnaire    Feeling of Stress : Only a little  Social Connections: Socially Isolated (03/02/2022)   Social Connection and Isolation Panel [NHANES]    Frequency of Communication with Friends and Family: More than three times a week    Frequency of Social Gatherings with Friends and Family: More than three times a week    Attends Religious Services: Never    Database administrator or Organizations: No    Attends Banker Meetings: Never    Marital Status: Widowed  Intimate Partner Violence: Not At Risk (03/02/2022)   Humiliation, Afraid, Rape, and Kick questionnaire    Fear of Current or Ex-Partner: No    Emotionally Abused: No    Physically Abused: No    Sexually Abused: No    Family History  Problem Relation Age of Onset   Hypercalcemia Daughter    Hypertension Daughter     ROS- All systems are reviewed and negative except as per the HPI above  Physical Exam: Vitals:   10/04/22 1143  BP: (!) 108/56  Pulse: 89  Weight: 61.7 kg  Height: 5\' 2"  (1.575 m)     GEN- The patient is well appearing, alert and oriented x 3 today.   Head- normocephalic, atraumatic Eyes-  Sclera clear, conjunctiva pink Ears- hearing intact Oropharynx- clear Neck- supple, no JVP Lymph- no cervical lymphadenopathy Lungs- Clear to ausculation bilaterally, normal work of breathing Heart- Irregular rate and rhythm, no murmurs, rubs or gallops, PMI not laterally displaced GI- soft, NT, ND, + BS Extremities- no clubbing, cyanosis, or edema MS- no significant deformity or atrophy Skin-  no rash or lesion Psych- euthymic mood, full affect Neuro- strength and sensation are intact   EKG-  Vent. rate 89 BPM PR interval * ms QRS duration 84 ms QT/QTcB 370/450 ms P-R-T axes * 78 0 Atrial fibrillation Low voltage QRS Abnormal ECG When compared with ECG of 20-Apr-2022 11:46, PREVIOUS ECG IS PRESENT  Epic records reviewed   Assessment and Plan: Permanent afib Rate controlled Continue metoprolol ER 25 mg bid and cardizem 120 mg bid, appears to have HR well controlled with these doses  Continue eliquis 5 mg bid, appropriately dosed at this time with weight > 60 kg and most recent creatinine 0.96 on 03/14/22.   F/u 6 months   L[place] Bells, PA-C Afib [facility] [address] [place], [place] [address] [phone]

## 2022-10-12 DIAGNOSIS — H35362 Drusen (degenerative) of macula, left eye: Secondary | ICD-10-CM | POA: Diagnosis not present

## 2022-11-30 ENCOUNTER — Other Ambulatory Visit: Payer: Self-pay | Admitting: Internal Medicine

## 2022-11-30 DIAGNOSIS — E034 Atrophy of thyroid (acquired): Secondary | ICD-10-CM

## 2022-12-18 ENCOUNTER — Other Ambulatory Visit: Payer: Self-pay | Admitting: Internal Medicine

## 2022-12-18 DIAGNOSIS — E034 Atrophy of thyroid (acquired): Secondary | ICD-10-CM

## 2023-01-17 ENCOUNTER — Other Ambulatory Visit: Payer: Self-pay | Admitting: Internal Medicine

## 2023-01-17 ENCOUNTER — Telehealth: Payer: Self-pay | Admitting: Internal Medicine

## 2023-01-17 DIAGNOSIS — E034 Atrophy of thyroid (acquired): Secondary | ICD-10-CM

## 2023-01-17 NOTE — Telephone Encounter (Signed)
Patient needs a refill of her SYNTHROID 125 MCG tablet sent to [id number] - [place], [place] - [address] She needs an appointment for a refill and she only has 5 pills left. Can we get her scheduled with Dr. Yetta Barre sooner once he is back in the office? Best callback is [phone].

## 2023-01-17 NOTE — Telephone Encounter (Signed)
Prescription Request  01/17/2023  LOV: 03/14/2022 Can pt please get a temporary refill until her appt she has an upcoming appt on October 2 What is the name of the medication or equipment?  SYNTHROID 125 MCG tablet [   Have you contacted your pharmacy to request a refill? No   Which pharmacy would you like this sent to?    [phone] [place],  - [address] [place] [address] Phone: [phone] Fax: [phone]  Patient notified that their request is being sent to the clinical staff for review and that they should receive a response within 2 business days.   Please advise at Mobile There is no such number on file (mobile).

## 2023-01-18 ENCOUNTER — Other Ambulatory Visit: Payer: Self-pay | Admitting: Internal Medicine

## 2023-01-18 DIAGNOSIS — R600 Localized edema: Secondary | ICD-10-CM

## 2023-01-18 MED ORDER — SYNTHROID 125 MCG PO TABS
125.0000 ug | ORAL_TABLET | Freq: Every day | ORAL | 0 refills | Status: DC
Start: 2023-01-18 — End: 2023-02-14

## 2023-01-21 ENCOUNTER — Other Ambulatory Visit: Payer: Self-pay | Admitting: Internal Medicine

## 2023-01-21 DIAGNOSIS — E034 Atrophy of thyroid (acquired): Secondary | ICD-10-CM

## 2023-01-24 ENCOUNTER — Ambulatory Visit: Payer: Medicare Other | Admitting: Internal Medicine

## 2023-01-26 DIAGNOSIS — Z23 Encounter for immunization: Secondary | ICD-10-CM | POA: Diagnosis not present

## 2023-02-14 ENCOUNTER — Other Ambulatory Visit: Payer: Self-pay | Admitting: Internal Medicine

## 2023-02-14 DIAGNOSIS — E034 Atrophy of thyroid (acquired): Secondary | ICD-10-CM

## 2023-02-14 DIAGNOSIS — R600 Localized edema: Secondary | ICD-10-CM

## 2023-02-16 ENCOUNTER — Encounter (HOSPITAL_COMMUNITY): Payer: Self-pay

## 2023-02-16 ENCOUNTER — Ambulatory Visit (HOSPITAL_COMMUNITY)
Admission: EM | Admit: 2023-02-16 | Discharge: 2023-02-16 | Disposition: A | Discharge status: Home / Self Care | Payer: Medicare Other | Attending: Family Medicine | Admitting: Family Medicine

## 2023-02-16 DIAGNOSIS — T148XXA Other injury of unspecified body region, initial encounter: Secondary | ICD-10-CM | POA: Diagnosis not present

## 2023-02-16 NOTE — ED Triage Notes (Signed)
Patient's daughter reports that the patient banged her right calf on something. Patient has a hematoma to the left calf. Patient takes Eliquis.

## 2023-02-16 NOTE — ED Provider Notes (Signed)
[facility]    CSN: [id number] Arrival date & time: 02/16/23  1052      History   Chief Complaint Chief Complaint  Patient presents with   Leg Injury    HPI Katrina Velez is a 89 y.o. female.   HPI Here for a swollen area and discomfort in her right calf.  Yesterday morning she was in the kitchen and bumped her calf on something.  She and her daughter have noted a little swelling and hard spot in her medial right calf and they wanted to get it checked out.  She does take Eliquis for persistent atrial fibrillation.  Daughter states there was no loss of consciousness or fall. Past Medical History:  Diagnosis Date   A-fib (HCC) 05/2015   HOSPITALIZED    Arthritis    Cataract    Hypothyroidism    Permanent atrial fibrillation (HCC)    Thyroid disease     Patient Active Problem List   Diagnosis Date Noted   Hypercoagulable state due to permanent atrial fibrillation (HCC) 10/04/2022   Permanent atrial fibrillation (HCC)    Encounter for palliative care involving management of pain 08/15/2021   Primary osteoarthritis of left hip 08/15/2021   Spinal stenosis of lumbar region with neurogenic claudication 05/11/2017   Seasonal allergic rhinitis due to pollen 10/04/2016   High risk medication use 10/04/2016   Hypothyroidism post removal of goiter 01/31/2012    Past Surgical History:  Procedure Laterality Date   CARDIOVERSION N/A 06/08/2015   Procedure: CARDIOVERSION;  Surgeon: Jake Bathe, MD;  Location: [facility] ENDOSCOPY;  Service: Cardiovascular;  Laterality: N/A;   CARDIOVERSION N/A 09/02/2015   Procedure: CARDIOVERSION;  Surgeon: Laqueta Linden, MD;  Location: [facility] ENDOSCOPY;  Service: Cardiovascular;  Laterality: N/A;   CATARACT EXTRACTION, BILATERAL Bilateral 2010   JOINT REPLACEMENT     KNEE SURGERY  2002   ORIF FEMUR FRACTURE Right 11/18/2020   Procedure: OPEN REDUCTION INTERNAL FIXATION (ORIF) DISTAL FEMUR FRACTURE;  Surgeon: Roby Lofts, MD;   Location: [facility] OR;  Service: Orthopedics;  Laterality: Right;   right hip replacement     RIGHT KNEE REPLACEMENT  2011   TEE WITHOUT CARDIOVERSION N/A 06/08/2015   Procedure: TRANSESOPHAGEAL ECHOCARDIOGRAM (TEE);  Surgeon: Jake Bathe, MD;  Location: [facility] ENDOSCOPY;  Service: Cardiovascular;  Laterality: N/A;   TOTAL HIP ARTHROPLASTY Right 2011   TOTAL SHOULDER REPLACEMENT Right 2003   TOTAL SHOULDER REPLACEMENT Left 2007   WRIST SURGERY Bilateral 2008   both wrists    OB History   No obstetric history on file.      Home Medications    Prior to Admission medications   Medication Sig Start Date End Date Taking? Authorizing Provider  diltiazem (CARDIZEM LA) 120 MG 24 hr tablet TAKE 1 TABLET BY MOUTH TWICE A DAY [BRAND NOT AVAILABLE PT WILL USE GENERIC] 05/23/22   Newman Nip, NP  ELIQUIS 5 MG TABS tablet TAKE 1 TABLET BY MOUTH TWICE A DAY 11/29/21   Newman Nip, NP  metoprolol succinate (TOPROL-XL) 25 MG 24 hr tablet TAKE 1 TABLET BY MOUTH TWICE A DAY 05/16/22   Newman Nip, NP  SYNTHROID 125 MCG tablet TAKE 1 TABLET BY MOUTH EVERY DAY 02/14/23   Etta Grandchild, MD  torsemide (DEMADEX) 20 MG tablet TAKE 1 TABLET BY MOUTH EVERY DAY 02/14/23   Etta Grandchild, MD  traMADol (ULTRAM) 50 MG tablet TAKE 1 TABLET BY MOUTH EVERY 6 HOURS AS NEEDED. 08/08/22  Etta Grandchild, MD    Family History Family History  Problem Relation Age of Onset   Hypercalcemia Daughter    Hypertension Daughter     Social History Social History   Tobacco Use   Smoking status: Former    Current packs/day: 0.00    Average packs/day: 1 pack/day for 65.0 years (65.0 ttl pk-yrs)    Types: Cigarettes    Start date: 05/27/1944    Quit date: 05/28/2011    Years since quitting: 11.7   Smokeless tobacco: Never  Vaping Use   Vaping status: Some Days   Substances: Nicotine  Substance Use Topics   Alcohol use: Not Currently    Alcohol/week: 1.0 - 2.0 standard drink of alcohol    Types: 1 - 2 Glasses of  wine per week    Comment: occasional drink   Drug use: No     Allergies   Banana and Penicillins   Review of Systems Review of Systems   Physical Exam Triage Vital Signs ED Triage Vitals  Encounter Vitals Group     BP 02/16/23 1102 110/67     Systolic BP Percentile --      Diastolic BP Percentile --      Pulse Rate 02/16/23 1102 92     Resp 02/16/23 1102 16     Temp 02/16/23 1102 98.4 F (36.9 C)     Temp Source 02/16/23 1102 Oral     SpO2 02/16/23 1102 95 %     Weight --      Height --      Head Circumference --      Peak Flow --      Pain Score 02/16/23 1104 8     Pain Loc --      Pain Education --      Exclude from Growth Chart --    No data found.  Updated Vital Signs BP 110/67 (BP Location: Right Arm)   Pulse 92   Temp 98.4 F (36.9 C) (Oral)   Resp 16   SpO2 95%   Visual Acuity Right Eye Distance:   Left Eye Distance:   Bilateral Distance:    Right Eye Near:   Left Eye Near:    Bilateral Near:     Physical Exam Vitals reviewed.  Constitutional:      General: She is not in acute distress.    Appearance: She is not ill-appearing, toxic-appearing or diaphoretic.     Comments: She is hard of hearing but in no distress in the exam room.  Daughter is in attendance and gives most of the history.  HENT:     Mouth/Throat:     Mouth: Mucous membranes are moist.  Cardiovascular:     Rate and Rhythm: Normal rate.  Pulmonary:     Effort: Pulmonary effort is normal.     Breath sounds: Normal breath sounds.  Musculoskeletal:     Comments: There is an area of induration and mild tenderness that is purple.  It is about 3 x 2 cm on her medial right calf, with some mild bruising around it and some swelling and tenderness also there.  The rest of the leg is not swollen.  There are some chronic venous stasis type changes with some dusky erythema in the entire lower legs bilaterally.  Pulses are normal bilaterally.   Skin:    Coloration: Skin is not pale.   Neurological:     General: No focal deficit present.     Mental  Status: She is alert and oriented to person, place, and time.      UC Treatments / Results  Labs (all labs ordered are listed, but only abnormal results are displayed) Labs Reviewed - No data to display  EKG   Radiology No results found.  Procedures Procedures (including critical care time)  Medications Ordered in UC Medications - No data to display  Initial Impression / Assessment and Plan / UC Course  I have reviewed the triage vital signs and the nursing notes.  Pertinent labs & imaging results that were available during my care of the patient were reviewed by me and considered in my medical decision making (see chart for details).        This is a fairly small hematoma, so I think compression and rest will be sufficient to help it get better on its own.  I have asked her to continue her Eliquis the same.  Ace wrap is supplied as a compression wrap.  They will check it twice daily and also will elevate it and she can have relative rest for the next couple of days.  Later that they can start putting a heating pad on it some.  They are return if there are signs of secondary infection.  She does see her internist next week. Final Clinical Impressions(s) / UC Diagnoses   Final diagnoses:  Hematoma     Discharge Instructions      Keep the compression wrap on the leg for several days. Taking it off and checking the wound area a couple of times a day is a good idea.    Also elevating the leg and limiting ambulation for the next couple of days is a good idea.  Ice can probably help how it feels and keep it from increasing in size in the next 24 hours.  After that heat with a heating pad on low would probably be a good idea to keep it from calcifying and solidifying.  If it starts looking redder or angrier and hurting more, it could have become infected.  We should see you again if that is the case.  I am  glad you have follow-up with your primary care next week.     ED Prescriptions   None    PDMP not reviewed this encounter.   Zenia Resides, MD 02/16/23 [phone]

## 2023-02-16 NOTE — Discharge Instructions (Addendum)
Keep the compression wrap on the leg for several days. Taking it off and checking the wound area a couple of times a day is a good idea.    Also elevating the leg and limiting ambulation for the next couple of days is a good idea.  Ice can probably help how it feels and keep it from increasing in size in the next 24 hours.  After that heat with a heating pad on low would probably be a good idea to keep it from calcifying and solidifying.  If it starts looking redder or angrier and hurting more, it could have become infected.  We should see you again if that is the case.  I am glad you have follow-up with your primary care next week.

## 2023-02-21 ENCOUNTER — Encounter: Payer: Self-pay | Admitting: Internal Medicine

## 2023-02-21 ENCOUNTER — Ambulatory Visit: Payer: Medicare Other | Admitting: Internal Medicine

## 2023-02-21 ENCOUNTER — Ambulatory Visit (INDEPENDENT_AMBULATORY_CARE_PROVIDER_SITE_OTHER): Payer: Medicare Other

## 2023-02-21 VITALS — BP 112/62 | HR 94 | Temp 98.2°F | Ht 62.0 in

## 2023-02-21 DIAGNOSIS — Z96611 Presence of right artificial shoulder joint: Secondary | ICD-10-CM | POA: Diagnosis not present

## 2023-02-21 DIAGNOSIS — Z23 Encounter for immunization: Secondary | ICD-10-CM | POA: Diagnosis not present

## 2023-02-21 DIAGNOSIS — I4821 Permanent atrial fibrillation: Secondary | ICD-10-CM

## 2023-02-21 DIAGNOSIS — E034 Atrophy of thyroid (acquired): Secondary | ICD-10-CM | POA: Diagnosis not present

## 2023-02-21 DIAGNOSIS — M1612 Unilateral primary osteoarthritis, left hip: Secondary | ICD-10-CM | POA: Diagnosis not present

## 2023-02-21 DIAGNOSIS — Z96612 Presence of left artificial shoulder joint: Secondary | ICD-10-CM | POA: Diagnosis not present

## 2023-02-21 DIAGNOSIS — Z471 Aftercare following joint replacement surgery: Secondary | ICD-10-CM | POA: Diagnosis not present

## 2023-02-21 DIAGNOSIS — R058 Other specified cough: Secondary | ICD-10-CM | POA: Diagnosis not present

## 2023-02-21 DIAGNOSIS — R052 Subacute cough: Secondary | ICD-10-CM | POA: Diagnosis not present

## 2023-02-21 DIAGNOSIS — D6869 Other thrombophilia: Secondary | ICD-10-CM

## 2023-02-21 LAB — CBC WITH DIFFERENTIAL/PLATELET
Basophils Absolute: 0.1 10*3/uL (ref 0.0–0.1)
Basophils Relative: 0.9 % (ref 0.0–3.0)
Eosinophils Absolute: 0.1 10*3/uL (ref 0.0–0.7)
Eosinophils Relative: 1.5 % (ref 0.0–5.0)
HCT: 40.4 % (ref 36.0–46.0)
Hemoglobin: 13 g/dL (ref 12.0–15.0)
Lymphocytes Relative: 13 % (ref 12.0–46.0)
Lymphs Abs: 1.1 10*3/uL (ref 0.7–4.0)
MCHC: 32.2 g/dL (ref 30.0–36.0)
MCV: 91.9 fL (ref 78.0–100.0)
Monocytes Absolute: 0.8 10*3/uL (ref 0.1–1.0)
Monocytes Relative: 10.2 % (ref 3.0–12.0)
Neutro Abs: 6 10*3/uL (ref 1.4–7.7)
Neutrophils Relative %: 74.4 % (ref 43.0–77.0)
Platelets: 365 10*3/uL (ref 150.0–400.0)
RBC: 4.39 Mil/uL (ref 3.87–5.11)
RDW: 14.6 % (ref 11.5–15.5)
WBC: 8.1 10*3/uL (ref 4.0–10.5)

## 2023-02-21 LAB — HEPATIC FUNCTION PANEL
ALT: 6 U/L (ref 0–35)
AST: 17 U/L (ref 0–37)
Albumin: 3.8 g/dL (ref 3.5–5.2)
Alkaline Phosphatase: 84 U/L (ref 39–117)
Bilirubin, Direct: 0.1 mg/dL (ref 0.0–0.3)
Total Bilirubin: 0.6 mg/dL (ref 0.2–1.2)
Total Protein: 6.8 g/dL (ref 6.0–8.3)

## 2023-02-21 LAB — BASIC METABOLIC PANEL
BUN: 16 mg/dL (ref 6–23)
CO2: 24 meq/L (ref 19–32)
Calcium: 9.9 mg/dL (ref 8.4–10.5)
Chloride: 103 meq/L (ref 96–112)
Creatinine, Ser: 0.82 mg/dL (ref 0.40–1.20)
GFR: 60.75 mL/min (ref >60.00)
Glucose, Bld: 95 mg/dL (ref 70–99)
Potassium: 4.4 meq/L (ref 3.5–5.1)
Sodium: 137 meq/L (ref 135–145)

## 2023-02-21 LAB — TSH: TSH: 1.01 u[IU]/mL (ref 0.35–5.50)

## 2023-02-21 MED ORDER — SYNTHROID 125 MCG PO TABS: 125.0000 ug | ORAL_TABLET | Freq: Every day | ORAL | 1 refills | Status: DC

## 2023-02-21 NOTE — Progress Notes (Signed)
Subjective:  Patient ID: Katrina Velez, female    DOB: 1926-04-13  Age: 89 y.o. MRN: [id number]  CC: Hypothyroidism and Cough   HPI Katrina Velez presents for f/up ----  Discussed the use of AI scribe software for clinical note transcription with the patient, who gave verbal consent to proceed.  History of Present Illness   The patient recently presented to the emergency room following an incident where she bumped their leg. The injury resulted in a non-open wound on the leg, which has been wrapped for protection. The patient reports significant swelling on the side of the leg, accompanied by a visible bruise. The patient experiences severe pain when the affected area comes into contact with any object. The pain and swelling have persisted since the incident, necessitating the use of a protective wrap. She complains of a 2-week history of productive cough.       Outpatient Medications Prior to Visit  Medication Sig Dispense Refill   diltiazem (CARDIZEM LA) 120 MG 24 hr tablet TAKE 1 TABLET BY MOUTH TWICE A DAY [BRAND NOT AVAILABLE PT WILL USE GENERIC] 180 tablet 2   ELIQUIS 5 MG TABS tablet TAKE 1 TABLET BY MOUTH TWICE A DAY 180 tablet 3   metoprolol succinate (TOPROL-XL) 25 MG 24 hr tablet TAKE 1 TABLET BY MOUTH TWICE A DAY 180 tablet 1   torsemide (DEMADEX) 20 MG tablet TAKE 1 TABLET BY MOUTH EVERY DAY 90 tablet 0   traMADol (ULTRAM) 50 MG tablet TAKE 1 TABLET BY MOUTH EVERY 6 HOURS AS NEEDED. 120 tablet 2   SYNTHROID 125 MCG tablet TAKE 1 TABLET BY MOUTH EVERY DAY 90 tablet 0   No facility-administered medications prior to visit.    ROS Review of Systems  Constitutional:  Negative for appetite change, chills, diaphoresis, fatigue and fever.  HENT: Negative.    Eyes:  Negative for visual disturbance.  Respiratory:  Positive for cough. Negative for chest tightness and shortness of breath.   Cardiovascular:  Negative for chest pain, palpitations and leg swelling.   Gastrointestinal:  Negative for abdominal pain, blood in stool, constipation, diarrhea, nausea and vomiting.  Endocrine: Negative.   Genitourinary: Negative.  Negative for difficulty urinating and dysuria.  Musculoskeletal:  Positive for arthralgias and gait problem.  Skin: Negative.   Neurological:  Negative for dizziness and weakness.  Hematological:  Negative for adenopathy. Does not bruise/bleed easily.  Psychiatric/Behavioral: Negative.      Objective:  BP 112/62 (BP Location: Right Arm, Patient Position: Sitting, Cuff Size: Large)   Pulse 94   Temp 98.2 F (36.8 C) (Oral)   Ht 5\' 2"  (1.575 m)   SpO2 95%   BMI 24.87 kg/m   BP Readings from Last 3 Encounters:  02/21/23 112/62  02/16/23 110/67  10/04/22 (!) 108/56    Wt Readings from Last 3 Encounters:  10/04/22 136 lb (61.7 kg)  04/20/22 138 lb 12.8 oz (63 kg)  10/12/21 138 lb (62.6 kg)    Physical Exam Vitals reviewed.  Constitutional:      Appearance: Normal appearance.  HENT:     Mouth/Throat:     Mouth: Mucous membranes are moist.  Eyes:     General: No scleral icterus.    Conjunctiva/sclera: Conjunctivae normal.  Cardiovascular:     Rate and Rhythm: Normal rate. Rhythm irregularly irregular.     Heart sounds: No murmur heard.    No gallop.  Pulmonary:     Effort: Pulmonary effort is normal.  Breath sounds: No stridor. No wheezing, rhonchi or rales.  Abdominal:     General: Abdomen is flat.     Palpations: There is no mass.     Tenderness: There is no abdominal tenderness. There is no guarding.     Hernia: No hernia is present.  Musculoskeletal:        General: Normal range of motion.     Cervical back: Neck supple.  Skin:    General: Skin is warm and dry.  Neurological:     General: No focal deficit present.     Mental Status: She is alert.     Comments: In a wheelchair  Psychiatric:        Mood and Affect: Mood normal.        Behavior: Behavior normal.     Lab Results  Component  Value Date   WBC 8.1 02/21/2023   HGB 13.0 02/21/2023   HCT 40.4 02/21/2023   PLT 365.0 02/21/2023   GLUCOSE 95 02/21/2023   CHOL 193 05/27/2014   TRIG 107 05/27/2014   HDL 66 05/27/2014   LDLCALC 106 (H) 05/27/2014   ALT 6 02/21/2023   AST 17 02/21/2023   NA 137 02/21/2023   K 4.4 02/21/2023   CL 103 02/21/2023   CREATININE 0.82 02/21/2023   BUN 16 02/21/2023   CO2 24 02/21/2023   TSH 1.01 02/21/2023   INR 1.6 (H) 11/16/2020    DG Chest 2 View  Result Date: 02/21/2023 CLINICAL DATA:  Productive cough for 2 weeks. EXAM: CHEST - 2 VIEW COMPARISON:  November 16, 2020. FINDINGS: Mild cardiomegaly. Both lungs are clear. Status post bilateral shoulder arthroplasties. IMPRESSION: No active cardiopulmonary disease. Aortic Atherosclerosis (ICD10-I70.0). Electronically Signed   By: Lupita Raider M.D.   On: 02/21/2023 12:37     Assessment & Plan:   Hypothyroidism due to acquired atrophy of thyroid- She is euthyroid. -     TSH; Future -     Hepatic function panel; Future -     CBC with Differential/Platelet; Future -     Synthroid; Take 1 tablet (125 mcg total) by mouth daily.  Dispense: 90 tablet; Refill: 1  Primary osteoarthritis of left hip -     Basic metabolic panel; Future -     CBC with Differential/Platelet; Future  Permanent atrial fibrillation (HCC) - She has good rate control. -     TSH; Future -     Hepatic function panel; Future -     CBC with Differential/Platelet; Future  Hypercoagulable state due to permanent atrial fibrillation (HCC)- Will continue the DOAC.  Subacute cough- CXR is negative for mass/infiltrate. -     DG Chest 2 View; Future  Flu vaccine need -     Flu Vaccine Trivalent High Dose (Fluad)     Follow-up: Return if symptoms worsen or fail to improve.  Sanda Linger, MD

## 2023-02-21 NOTE — Patient Instructions (Signed)
Cough, Adult Coughing is a reflex that clears your throat and airways (respiratory system). It helps heal and protect your lungs. It is normal to cough from time to time. A cough that happens with other symptoms or that lasts a long time may be a sign of a condition that needs treatment. A short-term (acute) cough may only last 2-3 weeks. A long-term (chronic) cough may last 8 or more weeks. Coughing is often caused by: Diseases, such as: An infection of the respiratory system. Asthma or other heart or lung diseases. Gastroesophageal reflux. This is when acid comes back up from the stomach. Breathing in things that irritate your lungs. Allergies. Postnasal drip. This is when mucus runs down the back of your throat. Smoking. Some medicines. Follow these instructions at home: Medicines Take over-the-counter and prescription medicines only as told by your health care provider. Talk with your provider before you take cough medicine (cough suppressants). Eating and drinking Do not drink alcohol. Avoid caffeine. Drink enough fluid to keep your pee (urine) pale yellow. Lifestyle Avoid cigarette smoke. Do not use any products that contain nicotine or tobacco. These products include cigarettes, chewing tobacco, and vaping devices, such as e-cigarettes. If you need help quitting, ask your provider. Avoid things that make you cough. These may include perfumes, candles, cleaning products, or campfire smoke. General instructions  Watch for any changes to your cough. Tell your provider about them. Always cover your mouth when you cough. If the air is dry in your bedroom or home, use a cool mist vaporizer or humidifier. If your cough is worse at night, try to sleep in a semi-upright position. Rest as needed. Contact a health care provider if: You have new symptoms, or your symptoms get worse. You cough up pus. You have a fever that does not go away or a cough that does not get better after 2-3  weeks. You cannot control your cough with medicine, and you are losing sleep. You have pain that gets worse or is not helped with medicine. You lose weight for no clear reason. You have night sweats. Get help right away if: You cough up blood. You have trouble breathing. Your heart is beating very fast. These symptoms may be an emergency. Get help right away. Call 911. Do not wait to see if the symptoms will go away. Do not drive yourself to the hospital. This information is not intended to replace advice given to you by your health care provider. Make sure you discuss any questions you have with your health care provider. Document Revised: 01/06/2022 Document Reviewed: 01/06/2022 Elsevier Patient Education  2024 Elsevier Inc.  

## 2023-03-29 ENCOUNTER — Other Ambulatory Visit (HOSPITAL_COMMUNITY): Payer: Self-pay | Admitting: *Deleted

## 2023-03-29 DIAGNOSIS — I4821 Permanent atrial fibrillation: Secondary | ICD-10-CM

## 2023-03-29 MED ORDER — DILTIAZEM HCL ER 120 MG PO TB24: 120.0000 mg | ORAL_TABLET | Freq: Two times a day (BID) | ORAL | 3 refills | Status: DC

## 2023-03-29 MED ORDER — METOPROLOL SUCCINATE ER 25 MG PO TB24: ORAL_TABLET | ORAL | 3 refills | Status: DC

## 2023-03-29 MED ORDER — APIXABAN 5 MG PO TABS: 5.0000 mg | ORAL_TABLET | Freq: Two times a day (BID) | ORAL | 3 refills | Status: DC

## 2023-04-04 ENCOUNTER — Ambulatory Visit (HOSPITAL_COMMUNITY)
Admission: RE | Admit: 2023-04-04 | Discharge: 2023-04-04 | Disposition: A | Discharge status: Home / Self Care | Payer: Medicare Other | Source: Ambulatory Visit | Attending: Internal Medicine | Admitting: Internal Medicine

## 2023-04-04 VITALS — BP 114/64 | HR 99 | Ht 62.0 in | Wt 132.4 lb

## 2023-04-04 DIAGNOSIS — I4821 Permanent atrial fibrillation: Secondary | ICD-10-CM | POA: Diagnosis not present

## 2023-04-04 DIAGNOSIS — I4891 Unspecified atrial fibrillation: Secondary | ICD-10-CM

## 2023-04-04 DIAGNOSIS — R9431 Abnormal electrocardiogram [ECG] [EKG]: Secondary | ICD-10-CM | POA: Diagnosis not present

## 2023-04-04 DIAGNOSIS — Z7901 Long term (current) use of anticoagulants: Secondary | ICD-10-CM | POA: Diagnosis not present

## 2023-04-04 NOTE — Progress Notes (Addendum)
Patient ID: Katrina Velez, female   DOB: 1926/04/26, 89 y.o.   MRN: [id number]       Primary Care Physician: Etta Grandchild, MD Referring Physician: Dr. Laqueta Jean Ghuman is a 89 y.o. female with a h/o afib that was loaded on amiodarone and successfully cardioverted 4/13, but with ERAF. She did not see that much difference in SR and had some thyroid concerns  already on replacement, so decision was made to stop amiodarone and aim for rate control,HR has been staying below 100 at home. Her energy is good for her age and she is back to hanging clothes outside. No issues with anticoagulant.   Returns to afib clinic 8/23, after she has noticed at home some fluctuation in heart rate. Her daughter on taking her heart rate and Bp one day, noticed that hr heart rate was 48 bpm, although the pt did not feel bad. She talked to the on call provider and was advised to skip the next rate control drug. A few days later, the pt had a nonsustained episode of rvr at 150 bpm. She did feel lightheaded with that episode. On call provider was contacted and she was told to how to take extra rate control if needed. However after a few more minutes, her HR slowed down.  EKG showed afib with controlled afib. She is also upset because she feels that she needs more thyroid replacement. She has also felt better on a higher dose than what the labs suggest that she takes. Her synthroid was reduced several months back and the pt is c/o thinning hair, thinning nails, more fatigue, depression. She is planning to change to a new MD in the first of October to see if she can get straightened out with her thyroid.   Returns 9/6, holter monitor placed on previous visit and reviewed. Shows average HR around 107 bpm, high rate 160 bpm, minimum 40 bpm at 4:30 am. Pt was not aware of fast or slow heart rate.She could benefit from more rate control. She is still able to do her house work with minimal symptoms and feels that she has good rate  control.   F/u in the afib clinic 10/25 and has good rate control. Currently is on metoprolol ER 25 mg bid and cardizem 120 mg bid. Tried higher doses of BB but could not tolerate. She is tolerating this combination of rate control with HR in the 80's.   F/u in afib clinic,4/25, she is feeling well. Does not feel afib . No issues with shortness of breath or pedal edema. No bleeding issues with eliquis.  F/u in fib clinic, 09/20/17. She has now turned 89 and is doing well. She did lacerate her leg on a patio chair about 6 weeks  Ago and did need stitches. It is healing but a large dark scab is still present. Otherwise, no complaints.  F/u in afib clinic, 03/28/18. She is doing very well. No complaints. It took the area on her leg described above, 6 months to heal and eventually this was done with the help of the would clinic. She is now 89 and going strong. Her renal panel for June showed a creatinine of 0.84 and with her current weight, she still qualifies for 5 mg eliquis bid. No bleeding issues.  F/u in afib clinic, 03/25/19. She continues in rate controlled afib.She did have a fall at home  and went to the ER with bruising and a couple of stitches.No major trauma. She  is now walking with a cane in the house and has a fall alert bracelet now. Does not know why she fell, just  got off balance.    F/u 09/23/19 in afib clinic. She feels well. Relieved to have has her covid shots so she can be more active. She and her daughter went to the beach in April and she really enjoyed the change in scenery. Her afib is rate controlled and she does not feel it. She  had a major fall last fall, mostly bruising, but no further reports of fall. Walks with a cane.     F/u in afib clinic, 03/22/20. Overall no  issues with afib, she is rate controlled   at home in the 80's/90's. Her biggest concern is that she  feels she needs at least 125 mcg levothyroxine to feel her best. This  has been her dose for years and when she  tries to reduce it, she feels terrible. She states that Dr. Neva Seat  before he retired would listen to her and her reason for the higher dose, not go strictly by lab values,  but her last few  physician's want to go strictly by the TSH. I explained to her that the lab  values are what is used to adjust meds but she feels it should be how she feels. She was recommended by Dr. Renato Gails to reduce to 112 mcg daily but she is taking an higher dose than  this, having older med doses  around the house, I believe she indicated she was taking 125 mcg daily.  F/u in afib clinic, 09/30/20. She remains in permanent afib with controlled ventricular rate. No recent falls or other complaints today. Her weight has declined but still above 60 kgs (61.8 kgs) and creatinine in less than 1.5, so  she will continue on eliquis 5 mg bid. No bleeding issues.   F/u in afib clinic, 04/19/21. She remains in rate controlled permament afib. Since last visit, she had a mechanical fall and had surgery for Rt  femur fracture. She had surgery without any complications. She is planing a new PCP visit in December.   F/u in afib clinic, 10/12/21. She remains in rate controlled afib she is having a lot of pain in her left knee and hip and is not felt to be a surgical candidate. She is pending an appointment with the pain clinic in the future.   F/u in afib clinic, 04/20/22. She remains at her baseline, rate controlled permanent afib. No falls. She is here with her daughter. Soft BP this am, has not drank much water this am. Overall, no concerns.   F/u in Afib clinic, 10/04/22. She remains at her baseline in rate controlled permanent Afib. She is here with her son. She is doing well overall, but chief complaint is chronic left leg pain.    F/u in Afib clinic, 04/04/23. She is at her baseline in rate controlled permanent Afib. She is doing well overall. She is here with her daughter. No specific concerns other than torsemide making her urinate  frequently.   Today, she denies symptoms of palpitations, chest pain, shortness of breath, orthopnea, PND, lower extremity edema, dizziness, presyncope, syncope, or neurologic sequela. The patient is tolerating medications without difficulties and is otherwise without complaint today.   Past Medical History:  Diagnosis Date   A-fib (HCC) 05/2015   HOSPITALIZED    Arthritis    Cataract    Hypothyroidism    Permanent atrial fibrillation (HCC)  Thyroid disease    Past Surgical History:  Procedure Laterality Date   CARDIOVERSION N/A 06/08/2015   Procedure: CARDIOVERSION;  Surgeon: Jake Bathe, MD;  Location: [facility] ENDOSCOPY;  Service: Cardiovascular;  Laterality: N/A;   CARDIOVERSION N/A 09/02/2015   Procedure: CARDIOVERSION;  Surgeon: Laqueta Linden, MD;  Location: [facility] ENDOSCOPY;  Service: Cardiovascular;  Laterality: N/A;   CATARACT EXTRACTION, BILATERAL Bilateral 2010   JOINT REPLACEMENT     KNEE SURGERY  2002   ORIF FEMUR FRACTURE Right 11/18/2020   Procedure: OPEN REDUCTION INTERNAL FIXATION (ORIF) DISTAL FEMUR FRACTURE;  Surgeon: Roby Lofts, MD;  Location: [facility] OR;  Service: Orthopedics;  Laterality: Right;   right hip replacement     RIGHT KNEE REPLACEMENT  2011   TEE WITHOUT CARDIOVERSION N/A 06/08/2015   Procedure: TRANSESOPHAGEAL ECHOCARDIOGRAM (TEE);  Surgeon: Jake Bathe, MD;  Location: [facility] ENDOSCOPY;  Service: Cardiovascular;  Laterality: N/A;   TOTAL HIP ARTHROPLASTY Right 2011   TOTAL SHOULDER REPLACEMENT Right 2003   TOTAL SHOULDER REPLACEMENT Left 2007   WRIST SURGERY Bilateral 2008   both wrists    Current Outpatient Medications  Medication Sig Dispense Refill   apixaban (ELIQUIS) 5 MG TABS tablet Take 1 tablet (5 mg total) by mouth 2 (two) times daily. 180 tablet 3   Cholecalciferol (VITAMIN D-3 PO) Take 1 tablet by mouth 4 (four) times a week.     Cyanocobalamin (VITAMIN B-12 PO) Take 1 tablet by mouth every morning.     diltiazem (CARDIZEM LA) 120 MG 24  hr tablet Take 1 tablet (120 mg total) by mouth 2 (two) times daily. 180 tablet 3   metoprolol succinate (TOPROL-XL) 25 MG 24 hr tablet Take 1 tablet by mouth twice a day 180 tablet 3   SYNTHROID 125 MCG tablet Take 1 tablet (125 mcg total) by mouth daily. 90 tablet 1   Thiamine HCl (VITAMIN B1 PO) Take 1 tablet by mouth every morning.     torsemide (DEMADEX) 20 MG tablet TAKE 1 TABLET BY MOUTH EVERY DAY 90 tablet 0   traMADol (ULTRAM) 50 MG tablet TAKE 1 TABLET BY MOUTH EVERY 6 HOURS AS NEEDED. 120 tablet 2   No current facility-administered medications for this encounter.    Allergies  Allergen Reactions   Banana Nausea Only    All banana products also   Penicillins Hives   ROS- All systems are reviewed and negative except as per the HPI above  Physical Exam: Vitals:   04/04/23 1120  BP: 114/64  Pulse: 99  Weight: 60.1 kg  Height: 5\' 2"  (1.575 m)    GEN- The patient is well appearing, alert and oriented x 3 today.   Neck - no JVD or carotid bruit noted Lungs- Clear to ausculation bilaterally, normal work of breathing Heart- Irregular rate and rhythm, no murmurs, rubs or gallops, PMI not laterally displaced Extremities- no clubbing, cyanosis, or edema Skin - no rash or ecchymosis noted   EKG-  Vent. rate 99 BPM PR interval * ms QRS duration 84 ms QT/QTcB 350/449 ms P-R-T axes * 58 38 Atrial fibrillation Abnormal ECG When compared with ECG of 04-Oct-2022 11:58, PREVIOUS ECG IS PRESENT  Epic records reviewed   Assessment and Plan: Permanent afib Rate controlled Continue metoprolol ER 25 mg bid and cardizem 120 mg bid, appears to have HR well controlled with these doses.  She requests office visits to be every 6 months.   Continue eliquis 5 mg bid, appropriately dosed at this time  with weight > 60 kg and most recent creatinine 0.82 on 02/21/23.   F/u 6 months Afib clinic.   Lake Bells, PA-C Afib Clinic [facility] [address] [place], [place] [address] [phone]

## 2023-04-30 ENCOUNTER — Ambulatory Visit (INDEPENDENT_AMBULATORY_CARE_PROVIDER_SITE_OTHER): Payer: Medicare Other | Admitting: Family Medicine

## 2023-04-30 ENCOUNTER — Encounter: Payer: Self-pay | Admitting: Family Medicine

## 2023-04-30 VITALS — BP 104/50 | HR 80 | Temp 98.2°F | Resp 20 | Ht 62.0 in | Wt 132.0 lb

## 2023-04-30 DIAGNOSIS — M542 Cervicalgia: Secondary | ICD-10-CM

## 2023-04-30 DIAGNOSIS — S81802A Unspecified open wound, left lower leg, initial encounter: Secondary | ICD-10-CM

## 2023-04-30 NOTE — Progress Notes (Signed)
Assessment & Plan:  1. Wound of left lower extremity, initial encounter Education provided on wound care.  Encouraged to purchase Mepilex dressings.  Wound cleansed with normal saline, antibiotic ointment applied, covered with dry dressing and secured with tape.  2. Musculoskeletal neck pain Education provided on muscle pain.  Encouraged heating pad, Biofreeze, Lidoderm patches, and changing her pillow.   Follow up plan: Return if symptoms worsen or fail to improve.  Deliah Boston, MSN, APRN, FNP-C  Subjective:  HPI: Katrina Velez is a 89 y.o. female presenting on 04/30/2023 for leg wound (Left lower shin area wound - very thin bruised skin, small open area noticed recently. Some redness and heat below the wound. ) and Neck Pain  Patient is accompanied by her daughter, who she is okay with being present.  Patient reports an open area on her left shin that she noticed yesterday.  She has very thin skin and bruises easily.  She is also concerned about right sided neck pain for the past year.  She has been applying a roll-on lidocaine.  She spends a lot of time at a computer.  Her daughter suspects she does not have the best pillow either.   ROS: Negative unless specifically indicated above in HPI.   Relevant past medical history reviewed and updated as indicated.   Allergies and medications reviewed and updated.   Current Outpatient Medications:    apixaban (ELIQUIS) 5 MG TABS tablet, Take 1 tablet (5 mg total) by mouth 2 (two) times daily., Disp: 180 tablet, Rfl: 3   Cholecalciferol (VITAMIN D-3 PO), Take 1 tablet by mouth 4 (four) times a week., Disp: , Rfl:    Cyanocobalamin (VITAMIN B-12 PO), Take 1 tablet by mouth every morning., Disp: , Rfl:    diltiazem (CARDIZEM LA) 120 MG 24 hr tablet, Take 1 tablet (120 mg total) by mouth 2 (two) times daily., Disp: 180 tablet, Rfl: 3   metoprolol succinate (TOPROL-XL) 25 MG 24 hr tablet, Take 1 tablet by mouth twice a day, Disp: 180  tablet, Rfl: 3   SYNTHROID 125 MCG tablet, Take 1 tablet (125 mcg total) by mouth daily., Disp: 90 tablet, Rfl: 1   Thiamine HCl (VITAMIN B1 PO), Take 1 tablet by mouth every morning., Disp: , Rfl:    torsemide (DEMADEX) 20 MG tablet, TAKE 1 TABLET BY MOUTH EVERY DAY, Disp: 90 tablet, Rfl: 0   traMADol (ULTRAM) 50 MG tablet, TAKE 1 TABLET BY MOUTH EVERY 6 HOURS AS NEEDED., Disp: 120 tablet, Rfl: 2  Allergies  Allergen Reactions   Banana Nausea Only    All banana products also   Penicillins Hives    Objective:   BP (!) 104/50   Pulse 80   Temp 98.2 F (36.8 C)   Resp 20   Ht 5\' 2"  (1.575 m)   Wt 132 lb (59.9 kg)   BMI 24.14 kg/m    Physical Exam Vitals reviewed.  Constitutional:      General: She is not in acute distress.    Appearance: Normal appearance. She is not ill-appearing, toxic-appearing or diaphoretic.  HENT:     Head: Normocephalic and atraumatic.  Eyes:     General: No scleral icterus.       Right eye: No discharge.        Left eye: No discharge.     Conjunctiva/sclera: Conjunctivae normal.  Cardiovascular:     Rate and Rhythm: Normal rate.  Pulmonary:     Effort: Pulmonary effort is normal.  No respiratory distress.  Musculoskeletal:        General: Normal range of motion.     Cervical back: Normal range of motion. Muscular tenderness (right side) present. No spinous process tenderness.  Skin:    General: Skin is warm and dry.     Capillary Refill: Capillary refill takes less than 2 seconds.     Findings: Wound (small to left shin with mild erythema - no drainage, swelling, or warmth) present.     Comments: Discoloration to both lower extremities consistent with peripheral arterial disease.  Neurological:     General: No focal deficit present.     Mental Status: She is alert and oriented to person, place, and time. Mental status is at baseline.  Psychiatric:        Mood and Affect: Mood normal.        Behavior: Behavior normal.        Thought Content:  Thought content normal.        Judgment: Judgment normal.

## 2023-04-30 NOTE — Patient Instructions (Addendum)
6 

## 2023-05-06 ENCOUNTER — Emergency Department (HOSPITAL_COMMUNITY)
Admission: EM | Admit: 2023-05-06 | Discharge: 2023-05-06 | Disposition: A | Discharge status: Home / Self Care | Payer: Medicare Other

## 2023-05-06 ENCOUNTER — Other Ambulatory Visit: Payer: Self-pay

## 2023-05-06 ENCOUNTER — Emergency Department (HOSPITAL_COMMUNITY)
Admission: EM | Admit: 2023-05-06 | Discharge: 2023-05-06 | Disposition: A | Discharge status: Home / Self Care | Payer: Medicare Other | Attending: Emergency Medicine | Admitting: Emergency Medicine

## 2023-05-06 ENCOUNTER — Emergency Department (HOSPITAL_COMMUNITY): Payer: Medicare Other

## 2023-05-06 ENCOUNTER — Encounter (HOSPITAL_COMMUNITY): Payer: Self-pay

## 2023-05-06 DIAGNOSIS — M1612 Unilateral primary osteoarthritis, left hip: Secondary | ICD-10-CM | POA: Diagnosis not present

## 2023-05-06 DIAGNOSIS — Z96652 Presence of left artificial knee joint: Secondary | ICD-10-CM | POA: Diagnosis not present

## 2023-05-06 DIAGNOSIS — M79661 Pain in right lower leg: Secondary | ICD-10-CM | POA: Diagnosis not present

## 2023-05-06 DIAGNOSIS — Z7901 Long term (current) use of anticoagulants: Secondary | ICD-10-CM | POA: Insufficient documentation

## 2023-05-06 DIAGNOSIS — Z96641 Presence of right artificial hip joint: Secondary | ICD-10-CM | POA: Diagnosis not present

## 2023-05-06 DIAGNOSIS — Z5189 Encounter for other specified aftercare: Secondary | ICD-10-CM | POA: Insufficient documentation

## 2023-05-06 DIAGNOSIS — M25562 Pain in left knee: Secondary | ICD-10-CM | POA: Diagnosis not present

## 2023-05-06 DIAGNOSIS — W19XXXA Unspecified fall, initial encounter: Secondary | ICD-10-CM | POA: Diagnosis not present

## 2023-05-06 DIAGNOSIS — S99911A Unspecified injury of right ankle, initial encounter: Secondary | ICD-10-CM | POA: Diagnosis not present

## 2023-05-06 DIAGNOSIS — R58 Hemorrhage, not elsewhere classified: Secondary | ICD-10-CM | POA: Diagnosis not present

## 2023-05-06 DIAGNOSIS — Z471 Aftercare following joint replacement surgery: Secondary | ICD-10-CM | POA: Diagnosis not present

## 2023-05-06 DIAGNOSIS — S81811A Laceration without foreign body, right lower leg, initial encounter: Secondary | ICD-10-CM | POA: Insufficient documentation

## 2023-05-06 DIAGNOSIS — X58XXXA Exposure to other specified factors, initial encounter: Secondary | ICD-10-CM | POA: Diagnosis not present

## 2023-05-06 DIAGNOSIS — W010XXA Fall on same level from slipping, tripping and stumbling without subsequent striking against object, initial encounter: Secondary | ICD-10-CM | POA: Insufficient documentation

## 2023-05-06 DIAGNOSIS — Z96651 Presence of right artificial knee joint: Secondary | ICD-10-CM | POA: Diagnosis not present

## 2023-05-06 DIAGNOSIS — M25552 Pain in left hip: Secondary | ICD-10-CM | POA: Diagnosis not present

## 2023-05-06 MED ORDER — ACETAMINOPHEN 325 MG PO TABS
650.0000 mg | ORAL_TABLET | Freq: Once | ORAL | Status: AC
  Administered 2023-05-06: 650 mg via ORAL
  Filled 2023-05-06: qty 2

## 2023-05-06 MED ORDER — LIDOCAINE-EPINEPHRINE (PF) 2 %-1:200000 IJ SOLN
10.0000 mL | Freq: Once | INTRAMUSCULAR | Status: DC
  Filled 2023-05-06: qty 20

## 2023-05-06 MED ORDER — LIDOCAINE HCL (PF) 1 % IJ SOLN
5.0000 mL | Freq: Once | INTRAMUSCULAR | Status: AC
  Administered 2023-05-06: 5 mL
  Filled 2023-05-06: qty 5

## 2023-05-06 NOTE — ED Notes (Signed)
X-ray at bedside

## 2023-05-06 NOTE — ED Provider Notes (Signed)
[place] EMERGENCY DEPARTMENT AT [facility] Provider Note   CSN: [id number] Arrival date & time: 05/06/23  1846     History  Chief Complaint  Patient presents with   Wound Check    Katrina Velez is a 89 y.o. female.  Presents for wound evaluation.  Was discharged from the emergency department earlier this morning after a fall with laceration.  She is on Eliquis.  Reports that after she got home wound began oozing with blood and had soaked through several bandages.  No lightheadedness or dizziness.   Wound Check       Home Medications Prior to Admission medications   Medication Sig Start Date End Date Taking? Authorizing Provider  apixaban (ELIQUIS) 5 MG TABS tablet Take 1 tablet (5 mg total) by mouth 2 (two) times daily. 03/29/23   Eustace Pen, PA-C  Cholecalciferol (VITAMIN D-3 PO) Take 1 tablet by mouth 4 (four) times a week.    [provider]  Cyanocobalamin (VITAMIN B-12 PO) Take 1 tablet by mouth every morning.    [provider]  diltiazem (CARDIZEM LA) 120 MG 24 hr tablet Take 1 tablet (120 mg total) by mouth 2 (two) times daily. 03/29/23   Eustace Pen, PA-C  metoprolol succinate (TOPROL-XL) 25 MG 24 hr tablet Take 1 tablet by mouth twice a day 03/29/23   Eustace Pen, PA-C  SYNTHROID 125 MCG tablet Take 1 tablet (125 mcg total) by mouth daily. 02/21/23   Etta Grandchild, MD  Thiamine HCl (VITAMIN B1 PO) Take 1 tablet by mouth every morning.    [provider]  torsemide (DEMADEX) 20 MG tablet TAKE 1 TABLET BY MOUTH EVERY DAY 02/14/23   Etta Grandchild, MD  traMADol (ULTRAM) 50 MG tablet TAKE 1 TABLET BY MOUTH EVERY 6 HOURS AS NEEDED. 08/08/22   Etta Grandchild, MD      Allergies    Banana and Penicillins    Review of Systems   Review of Systems  Physical Exam Updated Vital Signs BP 108/69   Pulse (!) 111   Temp 97.9 F (36.6 C) (Oral)   Resp 16   SpO2 98%  Physical Exam Vitals and nursing note reviewed.   HENT:     Head: Normocephalic and atraumatic.     Nose: Nose normal.     Mouth/Throat:     Mouth: Mucous membranes are moist.  Eyes:     Conjunctiva/sclera: Conjunctivae normal.  Cardiovascular:     Rate and Rhythm: Normal rate and regular rhythm.  Pulmonary:     Effort: Pulmonary effort is normal.  Abdominal:     General: Abdomen is flat. There is no distension.  Musculoskeletal:     Comments: Right lower extremity bandaged with bleeding.  Patient has long laceration.  Sutures placed this morning by Dr. Theresia Lo.  Well-approximated.  Does have a use shaped skin flap with some undermining to it.  Some venous oozing noted from wound.  Skin:    General: Skin is warm and dry.     Capillary Refill: Capillary refill takes less than 2 seconds.  Neurological:     Mental Status: She is oriented to person, place, and time.  Psychiatric:        Mood and Affect: Mood normal.        Behavior: Behavior normal.     ED Results / Procedures / Treatments   Labs (all labs ordered are listed, but only abnormal results are displayed) Labs Reviewed -  No data to display  EKG None  Radiology DG Knee Complete 4 Views Left Result Date: 05/06/2023 CLINICAL DATA:  Fall, pain. EXAM: LEFT KNEE - COMPLETE 4 VIEW COMPARISON:  None Available. FINDINGS: Status post left total knee arthroplasty. No acute fracture, dislocation or subluxation. No knee joint effusion. IMPRESSION: Postsurgical changes. No acute osseous abnormalities. Electronically Signed   By: Layla Maw M.D.   On: 05/06/2023 14:16   DG Tibia/Fibula Right Result Date: 05/06/2023 CLINICAL DATA:  Fall, pain. EXAM: RIGHT TIBIA AND FIBULA - 2 VIEW COMPARISON:  None Available. FINDINGS: There is no evidence of fracture or other focal bone lesions. Soft tissues are unremarkable. Right knee arthroplasty. IMPRESSION: Negative. Electronically Signed   By: Layla Maw M.D.   On: 05/06/2023 14:15   DG Hip Unilat With Pelvis 2-3 Views  Left Result Date: 05/06/2023 CLINICAL DATA:  Fall, pain EXAM: DG HIP (WITH OR WITHOUT PELVIS) 3V LEFT COMPARISON:  02/23/2013. FINDINGS: Osseous structures are osteopenic. Status post right total hip arthroplasty. Severe left hip osteoarthritis with loss of joint space, sclerosis, subchondral cysts and osteophytes. Calcification in the pelvis consistent with a fibroid. Aortoiliac atheromatous calcifications. Lumbosacral degenerative disc disease. IMPRESSION: Severe left hip osteoarthritis. Electronically Signed   By: Layla Maw M.D.   On: 05/06/2023 14:14    Procedures .Laceration Repair  Date/Time: 05/06/2023 9:07 PM  Performed by: Coral Spikes, DO Authorized by: Coral Spikes, DO   Consent:    Consent obtained:  Verbal   Consent given by:  Patient   Risks discussed:  Infection, pain, poor cosmetic result and vascular damage   Alternatives discussed:  No treatment and delayed treatment Universal protocol:    Procedure explained and questions answered to patient or proxy's satisfaction: yes     Patient identity confirmed:  Verbally with patient Anesthesia:    Anesthesia method:  Local infiltration   Local anesthetic:  Lidocaine 2% WITH epi Laceration details:    Location:  Leg   Leg location:  R lower leg   Length (cm):  5 Pre-procedure details:    Preparation:  Patient was prepped and draped in usual sterile fashion Exploration:    Hemostasis achieved with:  Epinephrine and direct pressure   Wound extent: no foreign body   Treatment:    Area cleansed with:  Saline Skin repair:    Repair method:  Sutures   Suture size:  5-0   Wound skin closure material used: vicryl.   Number of sutures:  5 Approximation:    Approximation:  Loose Repair type:    Repair type:  Simple Post-procedure details:    Dressing:  Bulky dressing Comments:     Large U-shaped skin flap with deep puncture some undermining.  No overt vascular injury.  Venous type using.  Did not approximate  very well together.  Managed with quick clot and bulky dressing and Coban.     Medications Ordered in ED Medications  lidocaine-EPINEPHrine (XYLOCAINE W/EPI) 2 %-1:200000 (PF) injection 10 mL (has no administration in time range)    ED Course/ Medical Decision Making/ A&P                                 Medical Decision Making 89 year old presenting emergency department with bleeding from right shin wound.  Seen earlier this morning.  No new complaints other than bleeding.  Venous type oozing.  Several sutures placed.  See procedure note.  Observed  in the emergency department with no further bleeding.  Stable for discharge at this time.  Risk Prescription drug management.   ument critical care time when appropriate:1}      Final Clinical Impression(s) / ED Diagnoses Final diagnoses:  None    Rx / DC Orders ED Discharge Orders     None         Coral Spikes, DO 05/06/23 2111

## 2023-05-06 NOTE — ED Triage Notes (Signed)
Per EMS, Pt, from home, c/o BLE skin tears following a trip and fall this morning.  Pt reports trying to step over something and falling.  Denies hitting head, dizziness, and LOC.  Pt is on thinners (Elliquis).

## 2023-05-06 NOTE — ED Provider Notes (Signed)
[place] EMERGENCY DEPARTMENT AT [facility] Provider Note   CSN: [id number] Arrival date & time: 05/06/23  1241     History  Chief Complaint  Patient presents with   Katrina Velez is a 89 y.o. female.  Patient is a 89 year old female with a past medical history of A-fib on Eliquis presenting to the emergency department after a fall.  The patient states that she was bringing ornaments and tried to step over a large step and her left leg gave out on her.  Her daughter was trying to help her up to the step and slowly lowered her to the ground but states that she scraped her shin.  She did not hit her head or lose consciousness.  Patient states that she is having pain in her left knee and in her right shin.  He denies feeling lightheaded or dizzy prior to the fall.  The history is provided by the patient and a relative.  Fall       Home Medications Prior to Admission medications   Medication Sig Start Date End Date Taking? Authorizing Provider  apixaban (ELIQUIS) 5 MG TABS tablet Take 1 tablet (5 mg total) by mouth 2 (two) times daily. 03/29/23   Eustace Pen, PA-C  Cholecalciferol (VITAMIN D-3 PO) Take 1 tablet by mouth 4 (four) times a week.    [provider]  Cyanocobalamin (VITAMIN B-12 PO) Take 1 tablet by mouth every morning.    [provider]  diltiazem (CARDIZEM LA) 120 MG 24 hr tablet Take 1 tablet (120 mg total) by mouth 2 (two) times daily. 03/29/23   Eustace Pen, PA-C  metoprolol succinate (TOPROL-XL) 25 MG 24 hr tablet Take 1 tablet by mouth twice a day 03/29/23   Eustace Pen, PA-C  SYNTHROID 125 MCG tablet Take 1 tablet (125 mcg total) by mouth daily. 02/21/23   Etta Grandchild, MD  Thiamine HCl (VITAMIN B1 PO) Take 1 tablet by mouth every morning.    [provider]  torsemide (DEMADEX) 20 MG tablet TAKE 1 TABLET BY MOUTH EVERY DAY 02/14/23   Etta Grandchild, MD  traMADol (ULTRAM) 50 MG tablet TAKE 1 TABLET BY  MOUTH EVERY 6 HOURS AS NEEDED. 08/08/22   Etta Grandchild, MD      Allergies    Banana and Penicillins    Review of Systems   Review of Systems  Physical Exam Updated Vital Signs BP 125/82   Pulse 90   Temp (!) 97.3 F (36.3 C) (Oral)   Resp (!) 25   Ht 5\' 2"  (1.575 m)   Wt 59.9 kg   SpO2 98%   BMI 24.14 kg/m  Physical Exam Vitals and nursing note reviewed.  Constitutional:      General: She is not in acute distress.    Appearance: Normal appearance.  HENT:     Head: Normocephalic and atraumatic.     Nose: Nose normal.     Mouth/Throat:     Mouth: Mucous membranes are moist.  Eyes:     Extraocular Movements: Extraocular movements intact.     Conjunctiva/sclera: Conjunctivae normal.  Neck:     Comments: No midline neck tenderness Cardiovascular:     Rate and Rhythm: Normal rate and regular rhythm.     Heart sounds: Normal heart sounds.  Pulmonary:     Effort: Pulmonary effort is normal.     Breath sounds: Normal breath sounds.  Abdominal:  General: Abdomen is flat.     Palpations: Abdomen is soft.     Tenderness: There is no abdominal tenderness.  Musculoskeletal:        General: Normal range of motion.     Cervical back: Normal range of motion and neck supple.     Comments: No midline back tenderness No bony tenderness to bilateral upper extremities Pelvis stable, nontender Tenderness to palpation of right shin with overlying skin tear/laceration Tenderness to palpation of left knee, pain with internal/external rotation of left hip, no obvious deformity  Skin:    General: Skin is warm and dry.     Comments: Approximately 8 cm skin tear/laceration of right shin, oozing, no pulsatile bleeding  Neurological:     General: No focal deficit present.     Mental Status: She is alert and oriented to person, place, and time.  Psychiatric:        Mood and Affect: Mood normal.        Behavior: Behavior normal.     ED Results / Procedures / Treatments    Labs (all labs ordered are listed, but only abnormal results are displayed) Labs Reviewed - No data to display  EKG None  Radiology DG Knee Complete 4 Views Left Result Date: 05/06/2023 CLINICAL DATA:  Fall, pain. EXAM: LEFT KNEE - COMPLETE 4 VIEW COMPARISON:  None Available. FINDINGS: Status post left total knee arthroplasty. No acute fracture, dislocation or subluxation. No knee joint effusion. IMPRESSION: Postsurgical changes. No acute osseous abnormalities. Electronically Signed   By: Layla Maw M.D.   On: 05/06/2023 14:16   DG Tibia/Fibula Right Result Date: 05/06/2023 CLINICAL DATA:  Fall, pain. EXAM: RIGHT TIBIA AND FIBULA - 2 VIEW COMPARISON:  None Available. FINDINGS: There is no evidence of fracture or other focal bone lesions. Soft tissues are unremarkable. Right knee arthroplasty. IMPRESSION: Negative. Electronically Signed   By: Layla Maw M.D.   On: 05/06/2023 14:15   DG Hip Unilat With Pelvis 2-3 Views Left Result Date: 05/06/2023 CLINICAL DATA:  Fall, pain EXAM: DG HIP (WITH OR WITHOUT PELVIS) 3V LEFT COMPARISON:  02/23/2013. FINDINGS: Osseous structures are osteopenic. Status post right total hip arthroplasty. Severe left hip osteoarthritis with loss of joint space, sclerosis, subchondral cysts and osteophytes. Calcification in the pelvis consistent with a fibroid. Aortoiliac atheromatous calcifications. Lumbosacral degenerative disc disease. IMPRESSION: Severe left hip osteoarthritis. Electronically Signed   By: Layla Maw M.D.   On: 05/06/2023 14:14    Procedures .Laceration Repair  Date/Time: 05/06/2023 2:52 PM  Performed by: Rexford Maus, DO Authorized by: Rexford Maus, DO   Consent:    Consent obtained:  Verbal   Consent given by:  Patient   Risks, benefits, and alternatives were discussed: yes     Risks discussed:  Infection, pain, retained foreign body, need for additional repair, poor cosmetic result and poor wound healing    Alternatives discussed:  No treatment and observation Universal protocol:    Procedure explained and questions answered to patient or proxy's satisfaction: yes     Patient identity confirmed:  Verbally with patient Anesthesia:    Anesthesia method:  Local infiltration   Local anesthetic:  Lidocaine 1% w/o epi Laceration details:    Location:  Leg   Leg location:  R lower leg   Length (cm):  8   Depth (mm):  3 Pre-procedure details:    Preparation:  Imaging obtained to evaluate for foreign bodies Exploration:    Limited defect created (wound  extended): yes     Hemostasis achieved with:  Direct pressure   Imaging obtained: x-ray     Imaging outcome: foreign body not noted     Wound exploration: wound explored through full range of motion and entire depth of wound visualized     Contaminated: no   Treatment:    Area cleansed with:  Soap and water   Amount of cleaning:  Standard   Irrigation solution:  Tap water   Irrigation method:  Tap   Visualized foreign bodies/material removed: no     Debridement:  None   Undermining:  None   Scar revision: no   Skin repair:    Repair method:  Sutures   Suture size:  4-0   Suture material:  Prolene   Suture technique:  Simple interrupted   Number of sutures:  5 Approximation:    Approximation:  Close Repair type:    Repair type:  Intermediate Post-procedure details:    Dressing:  Non-adherent dressing (Combat gauze dressing)   Procedure completion:  Tolerated well, no immediate complications     Medications Ordered in ED Medications  acetaminophen (TYLENOL) tablet 650 mg (650 mg Oral Given 05/06/23 1333)  lidocaine (PF) (XYLOCAINE) 1 % injection 5 mL (5 mLs Infiltration Given by Other 05/06/23 1333)    ED Course/ Medical Decision Making/ A&P Clinical Course as of 05/06/23 1457  Sun May 06, 2023  1430 No acute traumatic injury on XR. 5 sutures were placed. Non-stick dressing with combat gauze applied to skin tear with  hemostasis. She is stable for discharge home with outpatient follow up. [VK]    Clinical Course User Index [VK] Rexford Maus, DO                                 Medical Decision Making This patient presents to the ED with chief complaint(s) of fall, skin tear with pertinent past medical history of a fib on Eliquis which further complicates the presenting complaint. The complaint involves an extensive differential diagnosis and also carries with it a high risk of complications and morbidity.    The differential diagnosis includes patient had no trauma to the head making ICH or mass effect unlikely, considering tib-fib fracture or foreign body, knee fracture, hip fracture, no other traumatic injury seen on exam  Additional history obtained: Additional history obtained from family Records reviewed Primary Care Documents  ED Course and Reassessment: On patient's arrival she is hemodynamically stable in no acute distress.  Did note head trauma.  Will have x-ray of her right tib-fib as well as x-ray of her left knee and hip.  Her wound will be irrigated.  Wound appears fairly deep though proximal aspect is significant skin tear with dead skin.  Will attempt to approximate the lower portion of the wound with sutures and will need to apply a nonstick dressing to the proximal aspect of the wound.  May need to use combat gauze dressing for hemostasis.  Independent labs interpretation:  N/A  Independent visualization of imaging: - I independently visualized the following imaging with scope of interpretation limited to determining acute life threatening conditions related to emergency care: R tib/fib XR, L knee/hip XR, which revealed no acute traumatic injury  Consultation: - Consulted or discussed management/test interpretation w/ external professional: N/A  Consideration for admission or further workup: Patient has no emergent conditions requiring admission or further work-up at this time  and  is stable for discharge home with primary care follow-up  Social Determinants of health: N/A    Amount and/or Complexity of Data Reviewed Radiology: ordered.  Risk OTC drugs. Prescription drug management.          Final Clinical Impression(s) / ED Diagnoses Final diagnoses:  Fall, initial encounter  Laceration of right lower extremity, initial encounter    Rx / DC Orders ED Discharge Orders     None         Rexford Maus, DO 05/06/23 1457

## 2023-05-06 NOTE — ED Notes (Signed)
Pt verbalized understanding of discharge instructions. Opportunity for questions provided.  

## 2023-05-06 NOTE — ED Notes (Signed)
ED Provider at bedside. 

## 2023-05-06 NOTE — Discharge Instructions (Signed)
Please follow the instructions given to you by Dr. Theresia Lo.  Return if you develop any fevers chills, redness to the leg, wound begins draining pus or any new or worsening symptoms that are concerning to you.

## 2023-05-06 NOTE — ED Triage Notes (Signed)
Pt returns after being discharged earlier today due to continuous bleeding of sutures in her right lower leg that was placed today. Pt is on eliquis. Bleeding controlled with gauze but pt reports several dressing changes since the bleeding started

## 2023-05-06 NOTE — Discharge Instructions (Signed)
You are seen in the emergency department after your fall.  You had no broken bones.  You were able to place 5 stitches on your wound on your right leg, the part of the wound was too large of a skin tear to be able to repair but we have placed a nonstick dressing with gauze to help with clotting.  You should keep this dressing in place for the next 24 hours and then you can do daily dressing changes with nonstick dressings to help the wound heal.  You can wash the wound with soap and water just do not soak your wound underwater while the stitches are in place and make sure that you completely dry after showering.  You can follow-up with your primary doctor to have your wound rechecked and should have your stitches removed in the next 7 to 10 days.  You should return to the emergency department if you are having spreading redness from the wound, fevers, uncontrolled bleeding or any other new or concerning symptoms.

## 2023-05-08 ENCOUNTER — Ambulatory Visit (INDEPENDENT_AMBULATORY_CARE_PROVIDER_SITE_OTHER): Payer: Medicare Other | Admitting: Internal Medicine

## 2023-05-08 ENCOUNTER — Telehealth: Payer: Self-pay | Admitting: Internal Medicine

## 2023-05-08 ENCOUNTER — Encounter: Payer: Self-pay | Admitting: Internal Medicine

## 2023-05-08 VITALS — BP 100/62 | HR 94 | Temp 97.6°F | Ht 62.0 in | Wt 132.0 lb

## 2023-05-08 DIAGNOSIS — Z515 Encounter for palliative care: Secondary | ICD-10-CM | POA: Diagnosis not present

## 2023-05-08 DIAGNOSIS — S81802S Unspecified open wound, left lower leg, sequela: Secondary | ICD-10-CM

## 2023-05-08 DIAGNOSIS — I4821 Permanent atrial fibrillation: Secondary | ICD-10-CM | POA: Diagnosis not present

## 2023-05-08 MED ORDER — OXYCODONE HCL ER 10 MG PO T12A: 10.0000 mg | EXTENDED_RELEASE_TABLET | Freq: Two times a day (BID) | ORAL | 0 refills | Status: DC

## 2023-05-08 NOTE — Telephone Encounter (Signed)
Pharmacy needs prior auth for oxyCODONE (OXYCONTIN) 10 mg 12 hr tablet [[id number]] .Marland Kitchen  Please advise, Thanks

## 2023-05-08 NOTE — Patient Instructions (Signed)

## 2023-05-08 NOTE — Progress Notes (Signed)
Subjective:  Patient ID: Katrina Velez, female    DOB: [birth year]  Age: 89 y.o. MRN: [id number]  CC: No chief complaint on file.   HPI Katrina Velez presents for f/up ----   Discussed the use of AI scribe software for clinical note transcription with the patient, who gave verbal consent to proceed.  History of Present Illness   The patient presented with a wound that had been previously sutured in the emergency room due to significant bleeding. However, the bleeding persisted, prompting a return to the emergency room where additional stitches were placed by a different physician. The bleeding subsequently subsided, and the patient was instructed to change the bandage after 24 hours. Feeling unequipped to manage this at home, the patient sought medical attention.  In addition to the primary wound, the patient also reported a pre-existing wound on the leg. This wound had been evaluated with an x-ray and was not deemed infected during a previous visit with a nurse practitioner. The patient has a history of thin skin and is on Eliquis, which may contribute to easy bruising and bleeding.       Outpatient Medications Prior to Visit  Medication Sig Dispense Refill   apixaban (ELIQUIS) 5 MG TABS tablet Take 1 tablet (5 mg total) by mouth 2 (two) times daily. 180 tablet 3   Cholecalciferol (VITAMIN D-3 PO) Take 1 tablet by mouth 4 (four) times a week.     Cyanocobalamin (VITAMIN B-12 PO) Take 1 tablet by mouth every morning.     diltiazem (CARDIZEM LA) 120 MG 24 hr tablet Take 1 tablet (120 mg total) by mouth 2 (two) times daily. 180 tablet 3   metoprolol succinate (TOPROL-XL) 25 MG 24 hr tablet Take 1 tablet by mouth twice a day 180 tablet 3   SYNTHROID 125 MCG tablet Take 1 tablet (125 mcg total) by mouth daily. 90 tablet 1   Thiamine HCl (VITAMIN B1 PO) Take 1 tablet by mouth every morning.     torsemide (DEMADEX) 20 MG tablet TAKE 1 TABLET BY MOUTH EVERY DAY 90 tablet 0   traMADol (ULTRAM) 50  MG tablet TAKE 1 TABLET BY MOUTH EVERY 6 HOURS AS NEEDED. 120 tablet 2   No facility-administered medications prior to visit.    ROS Review of Systems  Constitutional:  Positive for fatigue. Negative for chills, diaphoresis, fever and unexpected weight change.  HENT: Negative.    Eyes:  Negative for visual disturbance.  Respiratory:  Negative for cough, chest tightness, shortness of breath and wheezing.   Cardiovascular:  Negative for chest pain, palpitations and leg swelling.  Gastrointestinal: Negative.  Negative for abdominal pain, diarrhea, nausea and vomiting.  Genitourinary: Negative.  Negative for difficulty urinating.  Musculoskeletal:  Positive for arthralgias, gait problem and myalgias. Negative for joint swelling and neck stiffness.  Skin:  Positive for wound. Negative for color change.  Neurological:  Positive for weakness. Negative for dizziness.  Hematological:  Negative for adenopathy. Bruises/bleeds easily.  Psychiatric/Behavioral:  Positive for confusion, decreased concentration and dysphoric mood. Negative for suicidal ideas.     Objective:  BP 100/62 (BP Location: Left Arm, Patient Position: Sitting, Cuff Size: Large)   Pulse 94   Temp 97.6 F (36.4 C) (Temporal)   Ht 5\' 2"  (1.575 m)   Wt 132 lb (59.9 kg)   SpO2 97%   BMI 24.14 kg/m   BP Readings from Last 3 Encounters:  05/08/23 100/62  05/06/23 130/61  05/06/23 120/69    Wt Readings  from Last 3 Encounters:  05/08/23 132 lb (59.9 kg)  05/06/23 132 lb (59.9 kg)  04/30/23 132 lb (59.9 kg)    Physical Exam Vitals reviewed.  Constitutional:      General: She is not in acute distress.    Appearance: She is ill-appearing. She is not toxic-appearing or diaphoretic.  HENT:     Mouth/Throat:     Mouth: Mucous membranes are moist.  Eyes:     General: No scleral icterus.    Conjunctiva/sclera: Conjunctivae normal.  Cardiovascular:     Rate and Rhythm: Normal rate. Rhythm irregularly irregular.      Heart sounds: Murmur heard.     Systolic murmur is present with a grade of 2/6.  Pulmonary:     Effort: Pulmonary effort is normal.     Breath sounds: No stridor. No wheezing, rhonchi or rales.  Abdominal:     General: Abdomen is flat.     Palpations: There is no mass.     Tenderness: There is no abdominal tenderness. There is no guarding.     Hernia: No hernia is present.  Musculoskeletal:     Cervical back: Neck supple.     Right lower leg: 1+ Pitting Edema present.     Left lower leg: 1+ Pitting Edema present.       Legs:  Lymphadenopathy:     Cervical: No cervical adenopathy.  Skin:    Findings: No rash.  Neurological:     Mental Status: She is alert. Mental status is at baseline.     Lab Results  Component Value Date   WBC 8.1 02/21/2023   HGB 13.0 02/21/2023   HCT 40.4 02/21/2023   PLT 365.0 02/21/2023   GLUCOSE 95 02/21/2023   CHOL 193 05/27/2014   TRIG 107 05/27/2014   HDL 66 05/27/2014   LDLCALC 106 (H) 05/27/2014   ALT 6 02/21/2023   AST 17 02/21/2023   NA 137 02/21/2023   K 4.4 02/21/2023   CL 103 02/21/2023   CREATININE 0.82 02/21/2023   BUN 16 02/21/2023   CO2 24 02/21/2023   TSH 1.01 02/21/2023   INR 1.6 (H) 11/16/2020    DG Knee Complete 4 Views Left Result Date: 05/06/2023 CLINICAL DATA:  Fall, pain. EXAM: LEFT KNEE - COMPLETE 4 VIEW COMPARISON:  None Available. FINDINGS: Status post left total knee arthroplasty. No acute fracture, dislocation or subluxation. No knee joint effusion. IMPRESSION: Postsurgical changes. No acute osseous abnormalities. Electronically Signed   By: Layla Maw M.D.   On: 05/06/2023 14:16   DG Tibia/Fibula Right Result Date: 05/06/2023 CLINICAL DATA:  Fall, pain. EXAM: RIGHT TIBIA AND FIBULA - 2 VIEW COMPARISON:  None Available. FINDINGS: There is no evidence of fracture or other focal bone lesions. Soft tissues are unremarkable. Right knee arthroplasty. IMPRESSION: Negative. Electronically Signed   By: Layla Maw M.D.   On: 05/06/2023 14:15   DG Hip Unilat With Pelvis 2-3 Views Left Result Date: 05/06/2023 CLINICAL DATA:  Fall, pain EXAM: DG HIP (WITH OR WITHOUT PELVIS) 3V LEFT COMPARISON:  02/23/2013. FINDINGS: Osseous structures are osteopenic. Status post right total hip arthroplasty. Severe left hip osteoarthritis with loss of joint space, sclerosis, subchondral cysts and osteophytes. Calcification in the pelvis consistent with a fibroid. Aortoiliac atheromatous calcifications. Lumbosacral degenerative disc disease. IMPRESSION: Severe left hip osteoarthritis. Electronically Signed   By: Layla Maw M.D.   On: 05/06/2023 14:14    Assessment & Plan:   Encounter for palliative care involving management  of pain -     oxyCODONE HCl ER; Take 1 tablet (10 mg total) by mouth every 12 (twelve) hours.  Dispense: 60 tablet; Refill: 0  Leg wound, left, sequela- I have asked her to hold the DOAC until the bleeding resolves. Both wounds were dressed with 4x4 gauze and loosely covered with coban. She is not willing to see plastics or wound care. I have asked [facility] to see her. -     Ambulatory referral to Home Health  [facility]) - She has good rate control.      Follow-up: Return in about 3 months (around 08/06/2023).  Sanda Linger, MD

## 2023-05-09 ENCOUNTER — Other Ambulatory Visit (HOSPITAL_COMMUNITY): Payer: Self-pay

## 2023-05-09 ENCOUNTER — Telehealth: Payer: Self-pay

## 2023-05-09 NOTE — Telephone Encounter (Signed)
Pharmacy Patient Advocate Encounter   Received notification from Pt Calls Messages that prior authorization for Oxycodone ER 10mg  tabs is required/requested.   Insurance verification completed.   The patient is insured through [facility] .   Per test claim: PA required; PA submitted to above mentioned insurance via CoverMyMeds Key/confirmation #/EOC [id number] Status is pending

## 2023-05-09 NOTE — Telephone Encounter (Signed)
Patient and her son has been notified of her medication approval and her co payment. Gave a verbal understanding.

## 2023-05-09 NOTE — Telephone Encounter (Signed)
Hello wonderful people! May you please assist me with this PA

## 2023-05-09 NOTE — Telephone Encounter (Signed)
Pharmacy Patient Advocate Encounter  Received notification from [facility] that Prior Authorization for Oxycodone ER 10mg  tabs has been APPROVED from 05/09/23 to 12/18//25   PA #/Case ID/Reference #: [id number]  Left a voicemail at [facility] to notify of the approval. Per test claim the copay is $30.

## 2023-05-10 DIAGNOSIS — I4821 Permanent atrial fibrillation: Secondary | ICD-10-CM | POA: Diagnosis not present

## 2023-05-10 DIAGNOSIS — S81802D Unspecified open wound, left lower leg, subsequent encounter: Secondary | ICD-10-CM | POA: Diagnosis not present

## 2023-05-10 DIAGNOSIS — D6869 Other thrombophilia: Secondary | ICD-10-CM | POA: Diagnosis not present

## 2023-05-10 DIAGNOSIS — E039 Hypothyroidism, unspecified: Secondary | ICD-10-CM | POA: Diagnosis not present

## 2023-05-10 DIAGNOSIS — Z79891 Long term (current) use of opiate analgesic: Secondary | ICD-10-CM | POA: Diagnosis not present

## 2023-05-10 DIAGNOSIS — M1612 Unilateral primary osteoarthritis, left hip: Secondary | ICD-10-CM | POA: Diagnosis not present

## 2023-05-10 DIAGNOSIS — M48062 Spinal stenosis, lumbar region with neurogenic claudication: Secondary | ICD-10-CM | POA: Diagnosis not present

## 2023-05-10 DIAGNOSIS — Z87891 Personal history of nicotine dependence: Secondary | ICD-10-CM | POA: Diagnosis not present

## 2023-05-10 DIAGNOSIS — S81801S Unspecified open wound, right lower leg, sequela: Secondary | ICD-10-CM | POA: Diagnosis not present

## 2023-05-10 DIAGNOSIS — Z7901 Long term (current) use of anticoagulants: Secondary | ICD-10-CM | POA: Diagnosis not present

## 2023-05-10 DIAGNOSIS — S81811D Laceration without foreign body, right lower leg, subsequent encounter: Secondary | ICD-10-CM | POA: Diagnosis not present

## 2023-05-11 DIAGNOSIS — I4821 Permanent atrial fibrillation: Secondary | ICD-10-CM | POA: Diagnosis not present

## 2023-05-11 DIAGNOSIS — S81802D Unspecified open wound, left lower leg, subsequent encounter: Secondary | ICD-10-CM | POA: Diagnosis not present

## 2023-05-11 DIAGNOSIS — S81811D Laceration without foreign body, right lower leg, subsequent encounter: Secondary | ICD-10-CM | POA: Diagnosis not present

## 2023-05-11 DIAGNOSIS — M48062 Spinal stenosis, lumbar region with neurogenic claudication: Secondary | ICD-10-CM | POA: Diagnosis not present

## 2023-05-11 DIAGNOSIS — D6869 Other thrombophilia: Secondary | ICD-10-CM | POA: Diagnosis not present

## 2023-05-11 DIAGNOSIS — E039 Hypothyroidism, unspecified: Secondary | ICD-10-CM | POA: Diagnosis not present

## 2023-05-13 DIAGNOSIS — E039 Hypothyroidism, unspecified: Secondary | ICD-10-CM | POA: Diagnosis not present

## 2023-05-13 DIAGNOSIS — S81802D Unspecified open wound, left lower leg, subsequent encounter: Secondary | ICD-10-CM | POA: Diagnosis not present

## 2023-05-13 DIAGNOSIS — M48062 Spinal stenosis, lumbar region with neurogenic claudication: Secondary | ICD-10-CM | POA: Diagnosis not present

## 2023-05-13 DIAGNOSIS — I4821 Permanent atrial fibrillation: Secondary | ICD-10-CM | POA: Diagnosis not present

## 2023-05-13 DIAGNOSIS — S81811D Laceration without foreign body, right lower leg, subsequent encounter: Secondary | ICD-10-CM | POA: Diagnosis not present

## 2023-05-13 DIAGNOSIS — D6869 Other thrombophilia: Secondary | ICD-10-CM | POA: Diagnosis not present

## 2023-05-14 DIAGNOSIS — M48062 Spinal stenosis, lumbar region with neurogenic claudication: Secondary | ICD-10-CM | POA: Diagnosis not present

## 2023-05-14 DIAGNOSIS — I4821 Permanent atrial fibrillation: Secondary | ICD-10-CM | POA: Diagnosis not present

## 2023-05-14 DIAGNOSIS — D6869 Other thrombophilia: Secondary | ICD-10-CM | POA: Diagnosis not present

## 2023-05-14 DIAGNOSIS — S81802D Unspecified open wound, left lower leg, subsequent encounter: Secondary | ICD-10-CM | POA: Diagnosis not present

## 2023-05-14 DIAGNOSIS — E039 Hypothyroidism, unspecified: Secondary | ICD-10-CM | POA: Diagnosis not present

## 2023-05-14 DIAGNOSIS — S81811D Laceration without foreign body, right lower leg, subsequent encounter: Secondary | ICD-10-CM | POA: Diagnosis not present

## 2023-05-15 DIAGNOSIS — I4821 Permanent atrial fibrillation: Secondary | ICD-10-CM | POA: Diagnosis not present

## 2023-05-15 DIAGNOSIS — M48062 Spinal stenosis, lumbar region with neurogenic claudication: Secondary | ICD-10-CM | POA: Diagnosis not present

## 2023-05-15 DIAGNOSIS — S81811D Laceration without foreign body, right lower leg, subsequent encounter: Secondary | ICD-10-CM | POA: Diagnosis not present

## 2023-05-15 DIAGNOSIS — E039 Hypothyroidism, unspecified: Secondary | ICD-10-CM | POA: Diagnosis not present

## 2023-05-15 DIAGNOSIS — D6869 Other thrombophilia: Secondary | ICD-10-CM | POA: Diagnosis not present

## 2023-05-15 DIAGNOSIS — S81802D Unspecified open wound, left lower leg, subsequent encounter: Secondary | ICD-10-CM | POA: Diagnosis not present

## 2023-05-17 DIAGNOSIS — M48062 Spinal stenosis, lumbar region with neurogenic claudication: Secondary | ICD-10-CM | POA: Diagnosis not present

## 2023-05-17 DIAGNOSIS — D6869 Other thrombophilia: Secondary | ICD-10-CM | POA: Diagnosis not present

## 2023-05-17 DIAGNOSIS — I4821 Permanent atrial fibrillation: Secondary | ICD-10-CM | POA: Diagnosis not present

## 2023-05-17 DIAGNOSIS — S81802D Unspecified open wound, left lower leg, subsequent encounter: Secondary | ICD-10-CM | POA: Diagnosis not present

## 2023-05-17 DIAGNOSIS — S81811D Laceration without foreign body, right lower leg, subsequent encounter: Secondary | ICD-10-CM | POA: Diagnosis not present

## 2023-05-17 DIAGNOSIS — E039 Hypothyroidism, unspecified: Secondary | ICD-10-CM | POA: Diagnosis not present

## 2023-05-21 DIAGNOSIS — E039 Hypothyroidism, unspecified: Secondary | ICD-10-CM | POA: Diagnosis not present

## 2023-05-21 DIAGNOSIS — S81811D Laceration without foreign body, right lower leg, subsequent encounter: Secondary | ICD-10-CM | POA: Diagnosis not present

## 2023-05-21 DIAGNOSIS — M48062 Spinal stenosis, lumbar region with neurogenic claudication: Secondary | ICD-10-CM | POA: Diagnosis not present

## 2023-05-21 DIAGNOSIS — S81802D Unspecified open wound, left lower leg, subsequent encounter: Secondary | ICD-10-CM | POA: Diagnosis not present

## 2023-05-21 DIAGNOSIS — D6869 Other thrombophilia: Secondary | ICD-10-CM | POA: Diagnosis not present

## 2023-05-21 DIAGNOSIS — I4821 Permanent atrial fibrillation: Secondary | ICD-10-CM | POA: Diagnosis not present

## 2023-05-22 ENCOUNTER — Ambulatory Visit: Payer: Medicare Other | Admitting: Internal Medicine

## 2023-05-22 VITALS — BP 114/78 | HR 77 | Temp 98.2°F | Ht 62.0 in | Wt 130.0 lb

## 2023-05-22 DIAGNOSIS — I4821 Permanent atrial fibrillation: Secondary | ICD-10-CM | POA: Diagnosis not present

## 2023-05-22 DIAGNOSIS — S81801D Unspecified open wound, right lower leg, subsequent encounter: Secondary | ICD-10-CM | POA: Diagnosis not present

## 2023-05-22 DIAGNOSIS — M79604 Pain in right leg: Secondary | ICD-10-CM | POA: Insufficient documentation

## 2023-05-22 DIAGNOSIS — S81801A Unspecified open wound, right lower leg, initial encounter: Secondary | ICD-10-CM | POA: Insufficient documentation

## 2023-05-22 MED ORDER — MUPIROCIN 2 % EX OINT: TOPICAL_OINTMENT | CUTANEOUS | 1 refills | Status: AC

## 2023-05-22 NOTE — Progress Notes (Signed)
 Subjective:  Patient ID: Katrina Velez, female    DOB: Sep 22, 1925  Age: 89 y.o. MRN: [id number]  CC: Suture / Staple Removal (Right leg. Has been in since 05/05/24. Pt states dr joshua has her off eliquis  for 5 days to help stop the bleeding and is back on it)   HPI Katrina Velez presents for suture removal procedure.  The patient sustained right leg angiogram on 05/05/2024.  The wound was partially repaired with sutures.  More bleeding occurred later that required another suturing it with dissolvable sutures.  No bleeding, or other, discharge lately. The patient is here with her daughter      Outpatient Medications Prior to Visit  Medication Sig Dispense Refill   apixaban  (ELIQUIS ) 5 MG TABS tablet Take 1 tablet (5 mg total) by mouth 2 (two) times daily. 180 tablet 3   Cholecalciferol (VITAMIN D-3 PO) Take 1 tablet by mouth 4 (four) times a week.     Cyanocobalamin  (VITAMIN B-12 PO) Take 1 tablet by mouth every morning.     diltiazem  (CARDIZEM  LA) 120 MG 24 hr tablet Take 1 tablet (120 mg total) by mouth 2 (two) times daily. 180 tablet 3   metoprolol  succinate (TOPROL -XL) 25 MG 24 hr tablet Take 1 tablet by mouth twice a day 180 tablet 3   SYNTHROID  125 MCG tablet Take 1 tablet (125 mcg total) by mouth daily. 90 tablet 1   Thiamine HCl (VITAMIN B1 PO) Take 1 tablet by mouth every morning.     torsemide  (DEMADEX ) 20 MG tablet TAKE 1 TABLET BY MOUTH EVERY DAY 90 tablet 0   traMADol  (ULTRAM ) 50 MG tablet TAKE 1 TABLET BY MOUTH EVERY 6 HOURS AS NEEDED. 120 tablet 2   oxyCODONE  (OXYCONTIN ) 10 mg 12 hr tablet Take 1 tablet (10 mg total) by mouth every 12 (twelve) hours. (Patient not taking: Reported on 05/22/2023) 60 tablet 0   No facility-administered medications prior to visit.    ROS: Review of Systems  Constitutional:  Positive for fatigue. Negative for activity change, appetite change, chills, diaphoresis, fever and unexpected weight change.  HENT:  Negative for congestion, mouth  sores and sinus pressure.   Eyes:  Negative for visual disturbance.  Respiratory:  Negative for cough and chest tightness.   Gastrointestinal:  Negative for abdominal pain and nausea.  Genitourinary:  Negative for difficulty urinating, frequency and vaginal pain.  Musculoskeletal:  Negative for back pain and gait problem.  Skin:  Positive for wound. Negative for pallor and rash.  Neurological:  Negative for dizziness, tremors, weakness, numbness and headaches.  Hematological:  Bruises/bleeds easily.  Psychiatric/Behavioral:  Negative for confusion and sleep disturbance.     Objective:  BP 114/78 (BP Location: Left Arm, Patient Position: Sitting, Cuff Size: Normal)   Pulse 77   Temp 98.2 F (36.8 C) (Oral)   Ht 5' 2 (1.575 m)   Wt 130 lb (59 kg)   SpO2 97%   BMI 23.78 kg/m   BP Readings from Last 3 Encounters:  05/22/23 114/78  05/08/23 100/62  05/06/23 130/61    Wt Readings from Last 3 Encounters:  05/22/23 130 lb (59 kg)  05/08/23 132 lb (59.9 kg)  05/06/23 132 lb (59.9 kg)    Physical Exam Constitutional:      General: She is not in acute distress.    Appearance: She is well-developed.  HENT:     Head: Normocephalic.     Right Ear: External ear normal.     Left Ear:  External ear normal.     Nose: Nose normal.  Eyes:     General:        Right eye: No discharge.        Left eye: No discharge.     Conjunctiva/sclera: Conjunctivae normal.     Pupils: Pupils are equal, round, and reactive to light.  Neck:     Thyroid : No thyromegaly.     Vascular: No JVD.     Trachea: No tracheal deviation.  Cardiovascular:     Rate and Rhythm: Normal rate. Rhythm irregular.     Heart sounds: Normal heart sounds.  Pulmonary:     Effort: No respiratory distress.     Breath sounds: No stridor. No wheezing.  Abdominal:     General: Bowel sounds are normal. There is no distension.     Palpations: Abdomen is soft. There is no mass.     Tenderness: There is no abdominal  tenderness. There is no guarding or rebound.  Musculoskeletal:        General: No tenderness.     Cervical back: Normal range of motion and neck supple. No rigidity.  Lymphadenopathy:     Cervical: No cervical adenopathy.  Skin:    Findings: No erythema or rash.  Neurological:     Mental Status: Mental status is at baseline.     Cranial Nerves: No cranial nerve deficit.     Motor: No abnormal muscle tone.     Coordination: Coordination normal.     Gait: Gait abnormal.     Deep Tendon Reflexes: Reflexes normal.  Psychiatric:        Behavior: Behavior normal.        Thought Content: Thought content normal.        Judgment: Judgment normal.   In a wheelchair.  The patient looks younger than her stated age.  Wound was uncovered, examined, cleaned and covered with Bactroban  ointment, large Telfa pad, sterile gauze and Coban to wrap.  I elected not to remove sutures.  I would rather have it done by Wound clinic.  The patient was referred to the Wound clinic. There is no signs of infection.    A total time of 45 minutes was spent preparing to see the patient, reviewing tests, x-rays, operative reports and other medical records.  Also, obtaining history and performing comprehensive physical exam, wound care.  Additionally, counseling the patient regarding the above listed issues.   Finally, documenting clinical information in the health records, coordination of care, educating the patient. It is a complex case.   Lab Results  Component Value Date   WBC 8.1 02/21/2023   HGB 13.0 02/21/2023   HCT 40.4 02/21/2023   PLT 365.0 02/21/2023   GLUCOSE 95 02/21/2023   CHOL 193 05/27/2014   TRIG 107 05/27/2014   HDL 66 05/27/2014   LDLCALC 106 (H) 05/27/2014   ALT 6 02/21/2023   AST 17 02/21/2023   NA 137 02/21/2023   K 4.4 02/21/2023   CL 103 02/21/2023   CREATININE 0.82 02/21/2023   BUN 16 02/21/2023   CO2 24 02/21/2023   TSH 1.01 02/21/2023   INR 1.6 (H) 11/16/2020    DG Knee  Complete 4 Views Left Result Date: 05/06/2023 CLINICAL DATA:  Fall, pain. EXAM: LEFT KNEE - COMPLETE 4 VIEW COMPARISON:  None Available. FINDINGS: Status post left total knee arthroplasty. No acute fracture, dislocation or subluxation. No knee joint effusion. IMPRESSION: Postsurgical changes. No acute osseous abnormalities. Electronically Signed  By: Fonda Field M.D.   On: 05/06/2023 14:16   DG Tibia/Fibula Right Result Date: 05/06/2023 CLINICAL DATA:  Fall, pain. EXAM: RIGHT TIBIA AND FIBULA - 2 VIEW COMPARISON:  None Available. FINDINGS: There is no evidence of fracture or other focal bone lesions. Soft tissues are unremarkable. Right knee arthroplasty. IMPRESSION: Negative. Electronically Signed   By: Fonda Field M.D.   On: 05/06/2023 14:15   DG Hip Unilat With Pelvis 2-3 Views Left Result Date: 05/06/2023 CLINICAL DATA:  Fall, pain EXAM: DG HIP (WITH OR WITHOUT PELVIS) 3V LEFT COMPARISON:  02/23/2013. FINDINGS: Osseous structures are osteopenic. Status post right total hip arthroplasty. Severe left hip osteoarthritis with loss of joint space, sclerosis, subchondral cysts and osteophytes. Calcification in the pelvis consistent with a fibroid. Aortoiliac atheromatous calcifications. Lumbosacral degenerative disc disease. IMPRESSION: Severe left hip osteoarthritis. Electronically Signed   By: Fonda Field M.D.   On: 05/06/2023 14:14    Assessment & Plan:   Problem List Items Addressed This Visit     RESOLVED: Atrial fibrillation (HCC)   On Eliquis       Wound of right leg - Primary   Wound was uncovered, examined, cleaned and covered with Bactroban  ointment, large Telfa pad, sterile gauze and Coban to wrap.  I elected not to remove sutures.  I would rather have it done by Wound clinic.  The patient was referred to the Wound clinic. There is no signs of infection. Provided with a prescription for Bactroban  ointment for dressing changes.      Relevant Orders   Ambulatory  referral to Wound Clinic      Meds ordered this encounter  Medications   mupirocin  ointment (BACTROBAN ) 2 %    Sig: On leg wound w/dressing change qd or bid    Dispense:  30 g    Refill:  1      Follow-up: No follow-ups on file.  Marolyn Noel, MD

## 2023-05-23 ENCOUNTER — Encounter: Payer: Self-pay | Admitting: Internal Medicine

## 2023-05-23 NOTE — Assessment & Plan Note (Signed)
 On Eliquis

## 2023-05-23 NOTE — Assessment & Plan Note (Signed)
 Wound was uncovered, examined, cleaned and covered with Bactroban  ointment, large Telfa pad, sterile gauze and Coban to wrap.  I elected not to remove sutures.  I would rather have it done by Wound clinic.  The patient was referred to the Wound clinic. There is no signs of infection. Provided with a prescription for Bactroban  ointment for dressing changes.

## 2023-05-24 DIAGNOSIS — S81802D Unspecified open wound, left lower leg, subsequent encounter: Secondary | ICD-10-CM | POA: Diagnosis not present

## 2023-05-24 DIAGNOSIS — I4821 Permanent atrial fibrillation: Secondary | ICD-10-CM | POA: Diagnosis not present

## 2023-05-24 DIAGNOSIS — M48062 Spinal stenosis, lumbar region with neurogenic claudication: Secondary | ICD-10-CM | POA: Diagnosis not present

## 2023-05-24 DIAGNOSIS — E039 Hypothyroidism, unspecified: Secondary | ICD-10-CM | POA: Diagnosis not present

## 2023-05-24 DIAGNOSIS — D6869 Other thrombophilia: Secondary | ICD-10-CM | POA: Diagnosis not present

## 2023-05-24 DIAGNOSIS — S81811D Laceration without foreign body, right lower leg, subsequent encounter: Secondary | ICD-10-CM | POA: Diagnosis not present

## 2023-05-25 DIAGNOSIS — D6869 Other thrombophilia: Secondary | ICD-10-CM | POA: Diagnosis not present

## 2023-05-25 DIAGNOSIS — M48062 Spinal stenosis, lumbar region with neurogenic claudication: Secondary | ICD-10-CM | POA: Diagnosis not present

## 2023-05-25 DIAGNOSIS — S81811D Laceration without foreign body, right lower leg, subsequent encounter: Secondary | ICD-10-CM | POA: Diagnosis not present

## 2023-05-25 DIAGNOSIS — E039 Hypothyroidism, unspecified: Secondary | ICD-10-CM | POA: Diagnosis not present

## 2023-05-25 DIAGNOSIS — I4821 Permanent atrial fibrillation: Secondary | ICD-10-CM | POA: Diagnosis not present

## 2023-05-25 DIAGNOSIS — S81802D Unspecified open wound, left lower leg, subsequent encounter: Secondary | ICD-10-CM | POA: Diagnosis not present

## 2023-05-28 DIAGNOSIS — D6869 Other thrombophilia: Secondary | ICD-10-CM | POA: Diagnosis not present

## 2023-05-28 DIAGNOSIS — M48062 Spinal stenosis, lumbar region with neurogenic claudication: Secondary | ICD-10-CM | POA: Diagnosis not present

## 2023-05-28 DIAGNOSIS — E039 Hypothyroidism, unspecified: Secondary | ICD-10-CM | POA: Diagnosis not present

## 2023-05-28 DIAGNOSIS — S81811D Laceration without foreign body, right lower leg, subsequent encounter: Secondary | ICD-10-CM | POA: Diagnosis not present

## 2023-05-28 DIAGNOSIS — I4821 Permanent atrial fibrillation: Secondary | ICD-10-CM | POA: Diagnosis not present

## 2023-05-28 DIAGNOSIS — S81802D Unspecified open wound, left lower leg, subsequent encounter: Secondary | ICD-10-CM | POA: Diagnosis not present

## 2023-05-30 ENCOUNTER — Telehealth: Payer: Self-pay | Admitting: Internal Medicine

## 2023-05-30 NOTE — Telephone Encounter (Signed)
Copied from CRM [id number]. Topic: General - Other >> May 30, 2023 11:50 AM Truddie Crumble wrote: Reason for CRM: Pt daughter nancy would like to be called in regards to the pt stitches CB# [phone]

## 2023-05-30 NOTE — Telephone Encounter (Signed)
 Patients daughter states that her mom has had stiches in her leg since 12/15 she seen you on 12/17 and Dr harley on 12/31, Dr harley didn't remove the stiches he referred her to wound clinic, they don't have anything available until 2/3. Katrina Velez still have the stiches in her leg TODAY. Inocente would like to know what you would recommend them to do because she doesn't think her mom should have these stiches in from almost a month. She doesn't see any sign of infection and neither does the nurses who come out and see Katrina. Jatia during the week.

## 2023-05-31 DIAGNOSIS — S81811D Laceration without foreign body, right lower leg, subsequent encounter: Secondary | ICD-10-CM | POA: Diagnosis not present

## 2023-05-31 DIAGNOSIS — I4821 Permanent atrial fibrillation: Secondary | ICD-10-CM | POA: Diagnosis not present

## 2023-05-31 DIAGNOSIS — S81802D Unspecified open wound, left lower leg, subsequent encounter: Secondary | ICD-10-CM | POA: Diagnosis not present

## 2023-05-31 DIAGNOSIS — D6869 Other thrombophilia: Secondary | ICD-10-CM | POA: Diagnosis not present

## 2023-05-31 DIAGNOSIS — E039 Hypothyroidism, unspecified: Secondary | ICD-10-CM | POA: Diagnosis not present

## 2023-05-31 DIAGNOSIS — M48062 Spinal stenosis, lumbar region with neurogenic claudication: Secondary | ICD-10-CM | POA: Diagnosis not present

## 2023-06-04 DIAGNOSIS — I4821 Permanent atrial fibrillation: Secondary | ICD-10-CM | POA: Diagnosis not present

## 2023-06-04 DIAGNOSIS — S81811D Laceration without foreign body, right lower leg, subsequent encounter: Secondary | ICD-10-CM | POA: Diagnosis not present

## 2023-06-04 DIAGNOSIS — M48062 Spinal stenosis, lumbar region with neurogenic claudication: Secondary | ICD-10-CM | POA: Diagnosis not present

## 2023-06-04 DIAGNOSIS — S81802D Unspecified open wound, left lower leg, subsequent encounter: Secondary | ICD-10-CM | POA: Diagnosis not present

## 2023-06-04 DIAGNOSIS — E039 Hypothyroidism, unspecified: Secondary | ICD-10-CM | POA: Diagnosis not present

## 2023-06-04 DIAGNOSIS — D6869 Other thrombophilia: Secondary | ICD-10-CM | POA: Diagnosis not present

## 2023-06-07 DIAGNOSIS — S81802D Unspecified open wound, left lower leg, subsequent encounter: Secondary | ICD-10-CM | POA: Diagnosis not present

## 2023-06-07 DIAGNOSIS — I4821 Permanent atrial fibrillation: Secondary | ICD-10-CM | POA: Diagnosis not present

## 2023-06-07 DIAGNOSIS — S81811D Laceration without foreign body, right lower leg, subsequent encounter: Secondary | ICD-10-CM | POA: Diagnosis not present

## 2023-06-07 DIAGNOSIS — M48062 Spinal stenosis, lumbar region with neurogenic claudication: Secondary | ICD-10-CM | POA: Diagnosis not present

## 2023-06-07 DIAGNOSIS — D6869 Other thrombophilia: Secondary | ICD-10-CM | POA: Diagnosis not present

## 2023-06-07 DIAGNOSIS — E039 Hypothyroidism, unspecified: Secondary | ICD-10-CM | POA: Diagnosis not present

## 2023-06-09 DIAGNOSIS — Z79891 Long term (current) use of opiate analgesic: Secondary | ICD-10-CM | POA: Diagnosis not present

## 2023-06-09 DIAGNOSIS — M1612 Unilateral primary osteoarthritis, left hip: Secondary | ICD-10-CM | POA: Diagnosis not present

## 2023-06-09 DIAGNOSIS — D6869 Other thrombophilia: Secondary | ICD-10-CM | POA: Diagnosis not present

## 2023-06-09 DIAGNOSIS — S81811D Laceration without foreign body, right lower leg, subsequent encounter: Secondary | ICD-10-CM | POA: Diagnosis not present

## 2023-06-09 DIAGNOSIS — S81801S Unspecified open wound, right lower leg, sequela: Secondary | ICD-10-CM | POA: Diagnosis not present

## 2023-06-09 DIAGNOSIS — E039 Hypothyroidism, unspecified: Secondary | ICD-10-CM | POA: Diagnosis not present

## 2023-06-09 DIAGNOSIS — S81802D Unspecified open wound, left lower leg, subsequent encounter: Secondary | ICD-10-CM | POA: Diagnosis not present

## 2023-06-09 DIAGNOSIS — I4821 Permanent atrial fibrillation: Secondary | ICD-10-CM | POA: Diagnosis not present

## 2023-06-09 DIAGNOSIS — M48062 Spinal stenosis, lumbar region with neurogenic claudication: Secondary | ICD-10-CM | POA: Diagnosis not present

## 2023-06-09 DIAGNOSIS — Z7901 Long term (current) use of anticoagulants: Secondary | ICD-10-CM | POA: Diagnosis not present

## 2023-06-09 DIAGNOSIS — Z87891 Personal history of nicotine dependence: Secondary | ICD-10-CM | POA: Diagnosis not present

## 2023-06-11 DIAGNOSIS — E039 Hypothyroidism, unspecified: Secondary | ICD-10-CM | POA: Diagnosis not present

## 2023-06-11 DIAGNOSIS — S81811D Laceration without foreign body, right lower leg, subsequent encounter: Secondary | ICD-10-CM | POA: Diagnosis not present

## 2023-06-11 DIAGNOSIS — M48062 Spinal stenosis, lumbar region with neurogenic claudication: Secondary | ICD-10-CM | POA: Diagnosis not present

## 2023-06-11 DIAGNOSIS — S81802D Unspecified open wound, left lower leg, subsequent encounter: Secondary | ICD-10-CM | POA: Diagnosis not present

## 2023-06-11 DIAGNOSIS — D6869 Other thrombophilia: Secondary | ICD-10-CM | POA: Diagnosis not present

## 2023-06-11 DIAGNOSIS — I4821 Permanent atrial fibrillation: Secondary | ICD-10-CM | POA: Diagnosis not present

## 2023-06-11 NOTE — Telephone Encounter (Signed)
Spoke with Dr.Jones in office, he states sutures do need to be removed as it has been over a month since they were placed. He states she should come in office to have them removed and he will assist if needed. Wound Clinic office closed today, tried calling to see if they could see patient sooner than 02/03. Patients daughter really needs sutures out for patient, scheduled for OV on 01/21 with Dr.Crawford

## 2023-06-11 NOTE — Telephone Encounter (Unsigned)
Copied from CRM [id number]. Topic: Clinical - Lab/Test Results >> Jun 11, 2023 12:26 PM Theodis Sato wrote: Reason for CRM: Patient is still awaiting a response from Dr. Yetta Barre and her nurse regarding getting her moms stitches out and patients daughter states home health nurse can do it.

## 2023-06-12 ENCOUNTER — Ambulatory Visit (INDEPENDENT_AMBULATORY_CARE_PROVIDER_SITE_OTHER): Payer: Medicare Other | Admitting: Internal Medicine

## 2023-06-12 ENCOUNTER — Encounter: Payer: Self-pay | Admitting: Internal Medicine

## 2023-06-12 VITALS — BP 100/64 | HR 87 | Temp 97.8°F | Ht 62.0 in | Wt 130.0 lb

## 2023-06-12 DIAGNOSIS — M79604 Pain in right leg: Secondary | ICD-10-CM | POA: Diagnosis not present

## 2023-06-12 NOTE — Progress Notes (Signed)
   Subjective:   Patient ID: Katrina Velez, female    DOB: 08-24-1925, 89 y.o.   MRN: [id number]  HPI The patient is a 89 YO female coming in for right leg pain and wound. Needs stitches removed  Review of Systems  Constitutional: Negative.   HENT: Negative.    Eyes: Negative.   Respiratory:  Negative for cough, chest tightness and shortness of breath.   Cardiovascular:  Negative for chest pain, palpitations and leg swelling.  Gastrointestinal:  Negative for abdominal distention, abdominal pain, constipation, diarrhea, nausea and vomiting.  Musculoskeletal:  Positive for myalgias.  Skin:  Positive for wound.  Neurological: Negative.   Psychiatric/Behavioral: Negative.      Objective:  Physical Exam Constitutional:      Appearance: She is well-developed.  HENT:     Head: Normocephalic and atraumatic.  Cardiovascular:     Rate and Rhythm: Normal rate. Rhythm irregular.  Pulmonary:     Effort: Pulmonary effort is normal. No respiratory distress.     Breath sounds: Normal breath sounds. No wheezing or rales.  Abdominal:     General: Bowel sounds are normal. There is no distension.     Palpations: Abdomen is soft.     Tenderness: There is no abdominal tenderness. There is no rebound.  Musculoskeletal:     Cervical back: Normal range of motion.  Skin:    General: Skin is warm and dry.     Comments: Wound right shin with granulation tissue some rolled tissue at the borders, 2 stitches removed without complications. No other stitches visualized.   Neurological:     Mental Status: She is alert and oriented to person, place, and time.     Coordination: Coordination abnormal.     Comments: wheelchair     Vitals:   06/12/23 1253  BP: 100/64  Pulse: 87  Temp: 97.8 F (36.6 C)  TempSrc: Oral  SpO2: 97%  Weight: 130 lb (59 kg)  Height: 5\' 2"  (1.575 m)    Assessment & Plan:  Visit time 20 minutes in face to face communication with patient and coordination of care, additional  10 minutes spent in record review, coordination or care, ordering tests, communicating/referring to other healthcare professionals, documenting in medical records all on the same day of the visit for total time 30 minutes spent on the visit.

## 2023-06-12 NOTE — Assessment & Plan Note (Signed)
Wound appears stable to perhaps slight improvement in size from 05/22/23. No signs of infection. 2 stitches removed and unable to visualize others. They may have fallen out naturally or may be underneath/embedded in granulation tissue. Visit with wound care early February would not advise to disturb healing tissue to try to find other 3 stitches. Rewrapped. Advised to change twice a week and monitor for redness, change in pain. No oozing detected on exam today which is an improvement from prior.

## 2023-06-14 DIAGNOSIS — S81811D Laceration without foreign body, right lower leg, subsequent encounter: Secondary | ICD-10-CM | POA: Diagnosis not present

## 2023-06-14 DIAGNOSIS — M48062 Spinal stenosis, lumbar region with neurogenic claudication: Secondary | ICD-10-CM | POA: Diagnosis not present

## 2023-06-14 DIAGNOSIS — E039 Hypothyroidism, unspecified: Secondary | ICD-10-CM | POA: Diagnosis not present

## 2023-06-14 DIAGNOSIS — D6869 Other thrombophilia: Secondary | ICD-10-CM | POA: Diagnosis not present

## 2023-06-14 DIAGNOSIS — S81802D Unspecified open wound, left lower leg, subsequent encounter: Secondary | ICD-10-CM | POA: Diagnosis not present

## 2023-06-14 DIAGNOSIS — I4821 Permanent atrial fibrillation: Secondary | ICD-10-CM | POA: Diagnosis not present

## 2023-06-18 DIAGNOSIS — M48062 Spinal stenosis, lumbar region with neurogenic claudication: Secondary | ICD-10-CM | POA: Diagnosis not present

## 2023-06-18 DIAGNOSIS — S81811D Laceration without foreign body, right lower leg, subsequent encounter: Secondary | ICD-10-CM | POA: Diagnosis not present

## 2023-06-18 DIAGNOSIS — S81802D Unspecified open wound, left lower leg, subsequent encounter: Secondary | ICD-10-CM | POA: Diagnosis not present

## 2023-06-18 DIAGNOSIS — I4821 Permanent atrial fibrillation: Secondary | ICD-10-CM | POA: Diagnosis not present

## 2023-06-18 DIAGNOSIS — E039 Hypothyroidism, unspecified: Secondary | ICD-10-CM | POA: Diagnosis not present

## 2023-06-18 DIAGNOSIS — D6869 Other thrombophilia: Secondary | ICD-10-CM | POA: Diagnosis not present

## 2023-06-19 DIAGNOSIS — I4821 Permanent atrial fibrillation: Secondary | ICD-10-CM | POA: Diagnosis not present

## 2023-06-19 DIAGNOSIS — M48062 Spinal stenosis, lumbar region with neurogenic claudication: Secondary | ICD-10-CM | POA: Diagnosis not present

## 2023-06-19 DIAGNOSIS — S81811D Laceration without foreign body, right lower leg, subsequent encounter: Secondary | ICD-10-CM | POA: Diagnosis not present

## 2023-06-19 DIAGNOSIS — D6869 Other thrombophilia: Secondary | ICD-10-CM | POA: Diagnosis not present

## 2023-06-19 DIAGNOSIS — S81802D Unspecified open wound, left lower leg, subsequent encounter: Secondary | ICD-10-CM | POA: Diagnosis not present

## 2023-06-19 DIAGNOSIS — E039 Hypothyroidism, unspecified: Secondary | ICD-10-CM | POA: Diagnosis not present

## 2023-06-21 DIAGNOSIS — M48062 Spinal stenosis, lumbar region with neurogenic claudication: Secondary | ICD-10-CM | POA: Diagnosis not present

## 2023-06-21 DIAGNOSIS — S81802D Unspecified open wound, left lower leg, subsequent encounter: Secondary | ICD-10-CM | POA: Diagnosis not present

## 2023-06-21 DIAGNOSIS — S81811D Laceration without foreign body, right lower leg, subsequent encounter: Secondary | ICD-10-CM | POA: Diagnosis not present

## 2023-06-21 DIAGNOSIS — I4821 Permanent atrial fibrillation: Secondary | ICD-10-CM | POA: Diagnosis not present

## 2023-06-21 DIAGNOSIS — D6869 Other thrombophilia: Secondary | ICD-10-CM | POA: Diagnosis not present

## 2023-06-21 DIAGNOSIS — E039 Hypothyroidism, unspecified: Secondary | ICD-10-CM | POA: Diagnosis not present

## 2023-06-25 ENCOUNTER — Encounter (HOSPITAL_BASED_OUTPATIENT_CLINIC_OR_DEPARTMENT_OTHER): Payer: Medicare Other | Attending: General Surgery | Admitting: General Surgery

## 2023-06-25 DIAGNOSIS — I872 Venous insufficiency (chronic) (peripheral): Secondary | ICD-10-CM | POA: Diagnosis not present

## 2023-06-25 DIAGNOSIS — Z7901 Long term (current) use of anticoagulants: Secondary | ICD-10-CM | POA: Insufficient documentation

## 2023-06-25 DIAGNOSIS — L97812 Non-pressure chronic ulcer of other part of right lower leg with fat layer exposed: Secondary | ICD-10-CM | POA: Diagnosis not present

## 2023-06-25 DIAGNOSIS — I4891 Unspecified atrial fibrillation: Secondary | ICD-10-CM | POA: Insufficient documentation

## 2023-06-26 DIAGNOSIS — S81811D Laceration without foreign body, right lower leg, subsequent encounter: Secondary | ICD-10-CM | POA: Diagnosis not present

## 2023-06-26 DIAGNOSIS — S81802D Unspecified open wound, left lower leg, subsequent encounter: Secondary | ICD-10-CM | POA: Diagnosis not present

## 2023-06-26 DIAGNOSIS — E039 Hypothyroidism, unspecified: Secondary | ICD-10-CM | POA: Diagnosis not present

## 2023-06-26 DIAGNOSIS — D6869 Other thrombophilia: Secondary | ICD-10-CM | POA: Diagnosis not present

## 2023-06-26 DIAGNOSIS — I4821 Permanent atrial fibrillation: Secondary | ICD-10-CM | POA: Diagnosis not present

## 2023-06-26 DIAGNOSIS — M48062 Spinal stenosis, lumbar region with neurogenic claudication: Secondary | ICD-10-CM | POA: Diagnosis not present

## 2023-06-28 DIAGNOSIS — D6869 Other thrombophilia: Secondary | ICD-10-CM | POA: Diagnosis not present

## 2023-06-28 DIAGNOSIS — M48062 Spinal stenosis, lumbar region with neurogenic claudication: Secondary | ICD-10-CM | POA: Diagnosis not present

## 2023-06-28 DIAGNOSIS — E039 Hypothyroidism, unspecified: Secondary | ICD-10-CM | POA: Diagnosis not present

## 2023-06-28 DIAGNOSIS — S81811D Laceration without foreign body, right lower leg, subsequent encounter: Secondary | ICD-10-CM | POA: Diagnosis not present

## 2023-06-28 DIAGNOSIS — I4821 Permanent atrial fibrillation: Secondary | ICD-10-CM | POA: Diagnosis not present

## 2023-06-28 DIAGNOSIS — S81802D Unspecified open wound, left lower leg, subsequent encounter: Secondary | ICD-10-CM | POA: Diagnosis not present

## 2023-07-02 ENCOUNTER — Encounter (HOSPITAL_BASED_OUTPATIENT_CLINIC_OR_DEPARTMENT_OTHER): Payer: Medicare Other | Admitting: General Surgery

## 2023-07-02 DIAGNOSIS — Z7901 Long term (current) use of anticoagulants: Secondary | ICD-10-CM | POA: Diagnosis not present

## 2023-07-02 DIAGNOSIS — I4891 Unspecified atrial fibrillation: Secondary | ICD-10-CM | POA: Diagnosis not present

## 2023-07-02 DIAGNOSIS — I872 Venous insufficiency (chronic) (peripheral): Secondary | ICD-10-CM | POA: Diagnosis not present

## 2023-07-02 DIAGNOSIS — L97812 Non-pressure chronic ulcer of other part of right lower leg with fat layer exposed: Secondary | ICD-10-CM | POA: Diagnosis not present

## 2023-07-04 ENCOUNTER — Telehealth: Payer: Self-pay | Admitting: Internal Medicine

## 2023-07-04 DIAGNOSIS — M48062 Spinal stenosis, lumbar region with neurogenic claudication: Secondary | ICD-10-CM | POA: Diagnosis not present

## 2023-07-04 DIAGNOSIS — S81811D Laceration without foreign body, right lower leg, subsequent encounter: Secondary | ICD-10-CM | POA: Diagnosis not present

## 2023-07-04 DIAGNOSIS — S81802D Unspecified open wound, left lower leg, subsequent encounter: Secondary | ICD-10-CM | POA: Diagnosis not present

## 2023-07-04 DIAGNOSIS — E039 Hypothyroidism, unspecified: Secondary | ICD-10-CM | POA: Diagnosis not present

## 2023-07-04 DIAGNOSIS — I4821 Permanent atrial fibrillation: Secondary | ICD-10-CM | POA: Diagnosis not present

## 2023-07-04 DIAGNOSIS — D6869 Other thrombophilia: Secondary | ICD-10-CM | POA: Diagnosis not present

## 2023-07-04 NOTE — Telephone Encounter (Signed)
Copied from CRM [id number]. Topic: Clinical - Home Health Verbal Orders >> Jul 04, 2023  4:46 PM Drema Balzarine wrote: Caller/Agency: [facility]  Callback Number: [phone] Service Requested: Physical Therapy Frequency: 1 WEEK 9 Any new concerns about the patient? Yes, still working balance, strengthening  and ambulation

## 2023-07-05 DIAGNOSIS — I4821 Permanent atrial fibrillation: Secondary | ICD-10-CM | POA: Diagnosis not present

## 2023-07-05 DIAGNOSIS — D6869 Other thrombophilia: Secondary | ICD-10-CM | POA: Diagnosis not present

## 2023-07-05 DIAGNOSIS — S81802D Unspecified open wound, left lower leg, subsequent encounter: Secondary | ICD-10-CM | POA: Diagnosis not present

## 2023-07-05 DIAGNOSIS — M48062 Spinal stenosis, lumbar region with neurogenic claudication: Secondary | ICD-10-CM | POA: Diagnosis not present

## 2023-07-05 DIAGNOSIS — S81811D Laceration without foreign body, right lower leg, subsequent encounter: Secondary | ICD-10-CM | POA: Diagnosis not present

## 2023-07-05 DIAGNOSIS — E039 Hypothyroidism, unspecified: Secondary | ICD-10-CM | POA: Diagnosis not present

## 2023-07-05 NOTE — Telephone Encounter (Signed)
Unable to reach [facility]

## 2023-07-05 NOTE — Telephone Encounter (Signed)
Verbal orders has been given

## 2023-07-05 NOTE — Telephone Encounter (Signed)
Is it okay for me to give the verbal orders

## 2023-07-09 ENCOUNTER — Encounter (HOSPITAL_BASED_OUTPATIENT_CLINIC_OR_DEPARTMENT_OTHER): Payer: Medicare Other | Admitting: General Surgery

## 2023-07-09 DIAGNOSIS — I872 Venous insufficiency (chronic) (peripheral): Secondary | ICD-10-CM | POA: Diagnosis not present

## 2023-07-09 DIAGNOSIS — D6869 Other thrombophilia: Secondary | ICD-10-CM | POA: Diagnosis not present

## 2023-07-09 DIAGNOSIS — E039 Hypothyroidism, unspecified: Secondary | ICD-10-CM | POA: Diagnosis not present

## 2023-07-09 DIAGNOSIS — M48062 Spinal stenosis, lumbar region with neurogenic claudication: Secondary | ICD-10-CM | POA: Diagnosis not present

## 2023-07-09 DIAGNOSIS — Z87891 Personal history of nicotine dependence: Secondary | ICD-10-CM | POA: Diagnosis not present

## 2023-07-09 DIAGNOSIS — I4821 Permanent atrial fibrillation: Secondary | ICD-10-CM | POA: Diagnosis not present

## 2023-07-09 DIAGNOSIS — Z7901 Long term (current) use of anticoagulants: Secondary | ICD-10-CM | POA: Diagnosis not present

## 2023-07-09 DIAGNOSIS — S81811D Laceration without foreign body, right lower leg, subsequent encounter: Secondary | ICD-10-CM | POA: Diagnosis not present

## 2023-07-09 DIAGNOSIS — I4891 Unspecified atrial fibrillation: Secondary | ICD-10-CM | POA: Diagnosis not present

## 2023-07-09 DIAGNOSIS — M1612 Unilateral primary osteoarthritis, left hip: Secondary | ICD-10-CM | POA: Diagnosis not present

## 2023-07-09 DIAGNOSIS — Z79891 Long term (current) use of opiate analgesic: Secondary | ICD-10-CM | POA: Diagnosis not present

## 2023-07-09 DIAGNOSIS — L97812 Non-pressure chronic ulcer of other part of right lower leg with fat layer exposed: Secondary | ICD-10-CM | POA: Diagnosis not present

## 2023-07-12 DIAGNOSIS — M1612 Unilateral primary osteoarthritis, left hip: Secondary | ICD-10-CM | POA: Diagnosis not present

## 2023-07-12 DIAGNOSIS — M48062 Spinal stenosis, lumbar region with neurogenic claudication: Secondary | ICD-10-CM | POA: Diagnosis not present

## 2023-07-12 DIAGNOSIS — E039 Hypothyroidism, unspecified: Secondary | ICD-10-CM | POA: Diagnosis not present

## 2023-07-12 DIAGNOSIS — D6869 Other thrombophilia: Secondary | ICD-10-CM | POA: Diagnosis not present

## 2023-07-12 DIAGNOSIS — I4821 Permanent atrial fibrillation: Secondary | ICD-10-CM | POA: Diagnosis not present

## 2023-07-12 DIAGNOSIS — S81811D Laceration without foreign body, right lower leg, subsequent encounter: Secondary | ICD-10-CM | POA: Diagnosis not present

## 2023-07-13 DIAGNOSIS — D6869 Other thrombophilia: Secondary | ICD-10-CM | POA: Diagnosis not present

## 2023-07-13 DIAGNOSIS — M1612 Unilateral primary osteoarthritis, left hip: Secondary | ICD-10-CM | POA: Diagnosis not present

## 2023-07-13 DIAGNOSIS — E039 Hypothyroidism, unspecified: Secondary | ICD-10-CM | POA: Diagnosis not present

## 2023-07-13 DIAGNOSIS — M48062 Spinal stenosis, lumbar region with neurogenic claudication: Secondary | ICD-10-CM | POA: Diagnosis not present

## 2023-07-13 DIAGNOSIS — S81811D Laceration without foreign body, right lower leg, subsequent encounter: Secondary | ICD-10-CM | POA: Diagnosis not present

## 2023-07-13 DIAGNOSIS — I4821 Permanent atrial fibrillation: Secondary | ICD-10-CM | POA: Diagnosis not present

## 2023-07-16 ENCOUNTER — Encounter (HOSPITAL_BASED_OUTPATIENT_CLINIC_OR_DEPARTMENT_OTHER): Payer: Medicare Other | Admitting: General Surgery

## 2023-07-16 DIAGNOSIS — I4891 Unspecified atrial fibrillation: Secondary | ICD-10-CM | POA: Diagnosis not present

## 2023-07-16 DIAGNOSIS — Z7901 Long term (current) use of anticoagulants: Secondary | ICD-10-CM | POA: Diagnosis not present

## 2023-07-16 DIAGNOSIS — I872 Venous insufficiency (chronic) (peripheral): Secondary | ICD-10-CM | POA: Diagnosis not present

## 2023-07-16 DIAGNOSIS — L97812 Non-pressure chronic ulcer of other part of right lower leg with fat layer exposed: Secondary | ICD-10-CM | POA: Diagnosis not present

## 2023-07-19 DIAGNOSIS — M48062 Spinal stenosis, lumbar region with neurogenic claudication: Secondary | ICD-10-CM | POA: Diagnosis not present

## 2023-07-19 DIAGNOSIS — M1612 Unilateral primary osteoarthritis, left hip: Secondary | ICD-10-CM | POA: Diagnosis not present

## 2023-07-19 DIAGNOSIS — S81811D Laceration without foreign body, right lower leg, subsequent encounter: Secondary | ICD-10-CM | POA: Diagnosis not present

## 2023-07-19 DIAGNOSIS — E039 Hypothyroidism, unspecified: Secondary | ICD-10-CM | POA: Diagnosis not present

## 2023-07-19 DIAGNOSIS — D6869 Other thrombophilia: Secondary | ICD-10-CM | POA: Diagnosis not present

## 2023-07-19 DIAGNOSIS — I4821 Permanent atrial fibrillation: Secondary | ICD-10-CM | POA: Diagnosis not present

## 2023-07-23 ENCOUNTER — Encounter (HOSPITAL_BASED_OUTPATIENT_CLINIC_OR_DEPARTMENT_OTHER): Payer: Medicare Other | Attending: General Surgery | Admitting: General Surgery

## 2023-07-23 DIAGNOSIS — L97812 Non-pressure chronic ulcer of other part of right lower leg with fat layer exposed: Secondary | ICD-10-CM | POA: Insufficient documentation

## 2023-07-23 DIAGNOSIS — Z7901 Long term (current) use of anticoagulants: Secondary | ICD-10-CM | POA: Insufficient documentation

## 2023-07-23 DIAGNOSIS — I872 Venous insufficiency (chronic) (peripheral): Secondary | ICD-10-CM | POA: Diagnosis not present

## 2023-07-23 DIAGNOSIS — I87311 Chronic venous hypertension (idiopathic) with ulcer of right lower extremity: Secondary | ICD-10-CM | POA: Diagnosis not present

## 2023-07-24 DIAGNOSIS — D6869 Other thrombophilia: Secondary | ICD-10-CM | POA: Diagnosis not present

## 2023-07-24 DIAGNOSIS — M48062 Spinal stenosis, lumbar region with neurogenic claudication: Secondary | ICD-10-CM | POA: Diagnosis not present

## 2023-07-24 DIAGNOSIS — E039 Hypothyroidism, unspecified: Secondary | ICD-10-CM | POA: Diagnosis not present

## 2023-07-24 DIAGNOSIS — S81811D Laceration without foreign body, right lower leg, subsequent encounter: Secondary | ICD-10-CM | POA: Diagnosis not present

## 2023-07-24 DIAGNOSIS — I4821 Permanent atrial fibrillation: Secondary | ICD-10-CM | POA: Diagnosis not present

## 2023-07-24 DIAGNOSIS — M1612 Unilateral primary osteoarthritis, left hip: Secondary | ICD-10-CM | POA: Diagnosis not present

## 2023-07-26 DIAGNOSIS — M1612 Unilateral primary osteoarthritis, left hip: Secondary | ICD-10-CM | POA: Diagnosis not present

## 2023-07-26 DIAGNOSIS — I4821 Permanent atrial fibrillation: Secondary | ICD-10-CM | POA: Diagnosis not present

## 2023-07-26 DIAGNOSIS — E039 Hypothyroidism, unspecified: Secondary | ICD-10-CM | POA: Diagnosis not present

## 2023-07-26 DIAGNOSIS — D6869 Other thrombophilia: Secondary | ICD-10-CM | POA: Diagnosis not present

## 2023-07-26 DIAGNOSIS — M48062 Spinal stenosis, lumbar region with neurogenic claudication: Secondary | ICD-10-CM | POA: Diagnosis not present

## 2023-07-26 DIAGNOSIS — S81811D Laceration without foreign body, right lower leg, subsequent encounter: Secondary | ICD-10-CM | POA: Diagnosis not present

## 2023-07-30 ENCOUNTER — Encounter (HOSPITAL_BASED_OUTPATIENT_CLINIC_OR_DEPARTMENT_OTHER): Admitting: General Surgery

## 2023-07-30 DIAGNOSIS — L97812 Non-pressure chronic ulcer of other part of right lower leg with fat layer exposed: Secondary | ICD-10-CM | POA: Diagnosis not present

## 2023-07-30 DIAGNOSIS — I87311 Chronic venous hypertension (idiopathic) with ulcer of right lower extremity: Secondary | ICD-10-CM | POA: Diagnosis not present

## 2023-07-30 DIAGNOSIS — Z7901 Long term (current) use of anticoagulants: Secondary | ICD-10-CM | POA: Diagnosis not present

## 2023-07-30 DIAGNOSIS — I872 Venous insufficiency (chronic) (peripheral): Secondary | ICD-10-CM | POA: Diagnosis not present

## 2023-07-31 DIAGNOSIS — M48062 Spinal stenosis, lumbar region with neurogenic claudication: Secondary | ICD-10-CM | POA: Diagnosis not present

## 2023-07-31 DIAGNOSIS — M1612 Unilateral primary osteoarthritis, left hip: Secondary | ICD-10-CM | POA: Diagnosis not present

## 2023-07-31 DIAGNOSIS — I4821 Permanent atrial fibrillation: Secondary | ICD-10-CM | POA: Diagnosis not present

## 2023-07-31 DIAGNOSIS — S81811D Laceration without foreign body, right lower leg, subsequent encounter: Secondary | ICD-10-CM | POA: Diagnosis not present

## 2023-07-31 DIAGNOSIS — D6869 Other thrombophilia: Secondary | ICD-10-CM | POA: Diagnosis not present

## 2023-07-31 DIAGNOSIS — E039 Hypothyroidism, unspecified: Secondary | ICD-10-CM | POA: Diagnosis not present

## 2023-08-02 DIAGNOSIS — E039 Hypothyroidism, unspecified: Secondary | ICD-10-CM | POA: Diagnosis not present

## 2023-08-02 DIAGNOSIS — M1612 Unilateral primary osteoarthritis, left hip: Secondary | ICD-10-CM | POA: Diagnosis not present

## 2023-08-02 DIAGNOSIS — M48062 Spinal stenosis, lumbar region with neurogenic claudication: Secondary | ICD-10-CM | POA: Diagnosis not present

## 2023-08-02 DIAGNOSIS — D6869 Other thrombophilia: Secondary | ICD-10-CM | POA: Diagnosis not present

## 2023-08-02 DIAGNOSIS — I4821 Permanent atrial fibrillation: Secondary | ICD-10-CM | POA: Diagnosis not present

## 2023-08-02 DIAGNOSIS — S81811D Laceration without foreign body, right lower leg, subsequent encounter: Secondary | ICD-10-CM | POA: Diagnosis not present

## 2023-08-06 ENCOUNTER — Encounter (HOSPITAL_BASED_OUTPATIENT_CLINIC_OR_DEPARTMENT_OTHER): Admitting: Internal Medicine

## 2023-08-06 DIAGNOSIS — I872 Venous insufficiency (chronic) (peripheral): Secondary | ICD-10-CM | POA: Diagnosis not present

## 2023-08-06 DIAGNOSIS — I87311 Chronic venous hypertension (idiopathic) with ulcer of right lower extremity: Secondary | ICD-10-CM | POA: Diagnosis not present

## 2023-08-06 DIAGNOSIS — L97812 Non-pressure chronic ulcer of other part of right lower leg with fat layer exposed: Secondary | ICD-10-CM | POA: Diagnosis not present

## 2023-08-06 DIAGNOSIS — Z7901 Long term (current) use of anticoagulants: Secondary | ICD-10-CM | POA: Diagnosis not present

## 2023-08-07 DIAGNOSIS — M1612 Unilateral primary osteoarthritis, left hip: Secondary | ICD-10-CM | POA: Diagnosis not present

## 2023-08-07 DIAGNOSIS — M48062 Spinal stenosis, lumbar region with neurogenic claudication: Secondary | ICD-10-CM | POA: Diagnosis not present

## 2023-08-07 DIAGNOSIS — D6869 Other thrombophilia: Secondary | ICD-10-CM | POA: Diagnosis not present

## 2023-08-07 DIAGNOSIS — S81811D Laceration without foreign body, right lower leg, subsequent encounter: Secondary | ICD-10-CM | POA: Diagnosis not present

## 2023-08-07 DIAGNOSIS — E039 Hypothyroidism, unspecified: Secondary | ICD-10-CM | POA: Diagnosis not present

## 2023-08-07 DIAGNOSIS — I4821 Permanent atrial fibrillation: Secondary | ICD-10-CM | POA: Diagnosis not present

## 2023-08-08 DIAGNOSIS — D6869 Other thrombophilia: Secondary | ICD-10-CM | POA: Diagnosis not present

## 2023-08-08 DIAGNOSIS — I4821 Permanent atrial fibrillation: Secondary | ICD-10-CM | POA: Diagnosis not present

## 2023-08-08 DIAGNOSIS — E039 Hypothyroidism, unspecified: Secondary | ICD-10-CM | POA: Diagnosis not present

## 2023-08-08 DIAGNOSIS — M48062 Spinal stenosis, lumbar region with neurogenic claudication: Secondary | ICD-10-CM | POA: Diagnosis not present

## 2023-08-08 DIAGNOSIS — S81811D Laceration without foreign body, right lower leg, subsequent encounter: Secondary | ICD-10-CM | POA: Diagnosis not present

## 2023-08-08 DIAGNOSIS — Z79891 Long term (current) use of opiate analgesic: Secondary | ICD-10-CM | POA: Diagnosis not present

## 2023-08-08 DIAGNOSIS — Z87891 Personal history of nicotine dependence: Secondary | ICD-10-CM | POA: Diagnosis not present

## 2023-08-08 DIAGNOSIS — Z7901 Long term (current) use of anticoagulants: Secondary | ICD-10-CM | POA: Diagnosis not present

## 2023-08-08 DIAGNOSIS — M1612 Unilateral primary osteoarthritis, left hip: Secondary | ICD-10-CM | POA: Diagnosis not present

## 2023-08-09 DIAGNOSIS — M1612 Unilateral primary osteoarthritis, left hip: Secondary | ICD-10-CM | POA: Diagnosis not present

## 2023-08-09 DIAGNOSIS — M48062 Spinal stenosis, lumbar region with neurogenic claudication: Secondary | ICD-10-CM | POA: Diagnosis not present

## 2023-08-09 DIAGNOSIS — S81811D Laceration without foreign body, right lower leg, subsequent encounter: Secondary | ICD-10-CM | POA: Diagnosis not present

## 2023-08-09 DIAGNOSIS — D6869 Other thrombophilia: Secondary | ICD-10-CM | POA: Diagnosis not present

## 2023-08-09 DIAGNOSIS — E039 Hypothyroidism, unspecified: Secondary | ICD-10-CM | POA: Diagnosis not present

## 2023-08-09 DIAGNOSIS — I4821 Permanent atrial fibrillation: Secondary | ICD-10-CM | POA: Diagnosis not present

## 2023-08-13 ENCOUNTER — Encounter (HOSPITAL_BASED_OUTPATIENT_CLINIC_OR_DEPARTMENT_OTHER): Admitting: General Surgery

## 2023-08-13 DIAGNOSIS — I872 Venous insufficiency (chronic) (peripheral): Secondary | ICD-10-CM | POA: Diagnosis not present

## 2023-08-13 DIAGNOSIS — Z7901 Long term (current) use of anticoagulants: Secondary | ICD-10-CM | POA: Diagnosis not present

## 2023-08-13 DIAGNOSIS — L97812 Non-pressure chronic ulcer of other part of right lower leg with fat layer exposed: Secondary | ICD-10-CM | POA: Diagnosis not present

## 2023-08-13 DIAGNOSIS — I87311 Chronic venous hypertension (idiopathic) with ulcer of right lower extremity: Secondary | ICD-10-CM | POA: Diagnosis not present

## 2023-08-14 DIAGNOSIS — D6869 Other thrombophilia: Secondary | ICD-10-CM | POA: Diagnosis not present

## 2023-08-14 DIAGNOSIS — E039 Hypothyroidism, unspecified: Secondary | ICD-10-CM | POA: Diagnosis not present

## 2023-08-14 DIAGNOSIS — I4821 Permanent atrial fibrillation: Secondary | ICD-10-CM | POA: Diagnosis not present

## 2023-08-14 DIAGNOSIS — S81811D Laceration without foreign body, right lower leg, subsequent encounter: Secondary | ICD-10-CM | POA: Diagnosis not present

## 2023-08-14 DIAGNOSIS — M48062 Spinal stenosis, lumbar region with neurogenic claudication: Secondary | ICD-10-CM | POA: Diagnosis not present

## 2023-08-14 DIAGNOSIS — M1612 Unilateral primary osteoarthritis, left hip: Secondary | ICD-10-CM | POA: Diagnosis not present

## 2023-08-16 DIAGNOSIS — I4821 Permanent atrial fibrillation: Secondary | ICD-10-CM | POA: Diagnosis not present

## 2023-08-16 DIAGNOSIS — D6869 Other thrombophilia: Secondary | ICD-10-CM | POA: Diagnosis not present

## 2023-08-16 DIAGNOSIS — M1612 Unilateral primary osteoarthritis, left hip: Secondary | ICD-10-CM | POA: Diagnosis not present

## 2023-08-16 DIAGNOSIS — M48062 Spinal stenosis, lumbar region with neurogenic claudication: Secondary | ICD-10-CM | POA: Diagnosis not present

## 2023-08-16 DIAGNOSIS — E039 Hypothyroidism, unspecified: Secondary | ICD-10-CM | POA: Diagnosis not present

## 2023-08-16 DIAGNOSIS — S81811D Laceration without foreign body, right lower leg, subsequent encounter: Secondary | ICD-10-CM | POA: Diagnosis not present

## 2023-08-20 ENCOUNTER — Encounter (HOSPITAL_BASED_OUTPATIENT_CLINIC_OR_DEPARTMENT_OTHER): Admitting: General Surgery

## 2023-08-20 DIAGNOSIS — Z7901 Long term (current) use of anticoagulants: Secondary | ICD-10-CM | POA: Diagnosis not present

## 2023-08-20 DIAGNOSIS — I87311 Chronic venous hypertension (idiopathic) with ulcer of right lower extremity: Secondary | ICD-10-CM | POA: Diagnosis not present

## 2023-08-20 DIAGNOSIS — L97812 Non-pressure chronic ulcer of other part of right lower leg with fat layer exposed: Secondary | ICD-10-CM | POA: Diagnosis not present

## 2023-08-21 DIAGNOSIS — I4821 Permanent atrial fibrillation: Secondary | ICD-10-CM | POA: Diagnosis not present

## 2023-08-21 DIAGNOSIS — E039 Hypothyroidism, unspecified: Secondary | ICD-10-CM | POA: Diagnosis not present

## 2023-08-21 DIAGNOSIS — M48062 Spinal stenosis, lumbar region with neurogenic claudication: Secondary | ICD-10-CM | POA: Diagnosis not present

## 2023-08-21 DIAGNOSIS — M1612 Unilateral primary osteoarthritis, left hip: Secondary | ICD-10-CM | POA: Diagnosis not present

## 2023-08-21 DIAGNOSIS — S81811D Laceration without foreign body, right lower leg, subsequent encounter: Secondary | ICD-10-CM | POA: Diagnosis not present

## 2023-08-21 DIAGNOSIS — D6869 Other thrombophilia: Secondary | ICD-10-CM | POA: Diagnosis not present

## 2023-08-27 DIAGNOSIS — M48062 Spinal stenosis, lumbar region with neurogenic claudication: Secondary | ICD-10-CM | POA: Diagnosis not present

## 2023-08-27 DIAGNOSIS — D6869 Other thrombophilia: Secondary | ICD-10-CM | POA: Diagnosis not present

## 2023-08-27 DIAGNOSIS — S81811D Laceration without foreign body, right lower leg, subsequent encounter: Secondary | ICD-10-CM | POA: Diagnosis not present

## 2023-08-27 DIAGNOSIS — M1612 Unilateral primary osteoarthritis, left hip: Secondary | ICD-10-CM | POA: Diagnosis not present

## 2023-08-27 DIAGNOSIS — I4821 Permanent atrial fibrillation: Secondary | ICD-10-CM | POA: Diagnosis not present

## 2023-08-27 DIAGNOSIS — E039 Hypothyroidism, unspecified: Secondary | ICD-10-CM | POA: Diagnosis not present

## 2023-08-29 ENCOUNTER — Other Ambulatory Visit: Payer: Self-pay | Admitting: Internal Medicine

## 2023-08-29 DIAGNOSIS — E034 Atrophy of thyroid (acquired): Secondary | ICD-10-CM

## 2023-08-30 DIAGNOSIS — I4821 Permanent atrial fibrillation: Secondary | ICD-10-CM | POA: Diagnosis not present

## 2023-08-30 DIAGNOSIS — M48062 Spinal stenosis, lumbar region with neurogenic claudication: Secondary | ICD-10-CM | POA: Diagnosis not present

## 2023-08-30 DIAGNOSIS — E039 Hypothyroidism, unspecified: Secondary | ICD-10-CM | POA: Diagnosis not present

## 2023-08-30 DIAGNOSIS — S81811D Laceration without foreign body, right lower leg, subsequent encounter: Secondary | ICD-10-CM | POA: Diagnosis not present

## 2023-08-30 DIAGNOSIS — M1612 Unilateral primary osteoarthritis, left hip: Secondary | ICD-10-CM | POA: Diagnosis not present

## 2023-08-30 DIAGNOSIS — D6869 Other thrombophilia: Secondary | ICD-10-CM | POA: Diagnosis not present

## 2023-08-31 ENCOUNTER — Other Ambulatory Visit: Payer: Self-pay | Admitting: Internal Medicine

## 2023-08-31 DIAGNOSIS — R600 Localized edema: Secondary | ICD-10-CM

## 2023-09-04 DIAGNOSIS — M1612 Unilateral primary osteoarthritis, left hip: Secondary | ICD-10-CM | POA: Diagnosis not present

## 2023-09-04 DIAGNOSIS — M48062 Spinal stenosis, lumbar region with neurogenic claudication: Secondary | ICD-10-CM | POA: Diagnosis not present

## 2023-09-04 DIAGNOSIS — I4821 Permanent atrial fibrillation: Secondary | ICD-10-CM | POA: Diagnosis not present

## 2023-09-04 DIAGNOSIS — E039 Hypothyroidism, unspecified: Secondary | ICD-10-CM | POA: Diagnosis not present

## 2023-09-04 DIAGNOSIS — S81811D Laceration without foreign body, right lower leg, subsequent encounter: Secondary | ICD-10-CM | POA: Diagnosis not present

## 2023-09-04 DIAGNOSIS — D6869 Other thrombophilia: Secondary | ICD-10-CM | POA: Diagnosis not present

## 2023-09-06 DIAGNOSIS — E039 Hypothyroidism, unspecified: Secondary | ICD-10-CM | POA: Diagnosis not present

## 2023-09-06 DIAGNOSIS — S81811D Laceration without foreign body, right lower leg, subsequent encounter: Secondary | ICD-10-CM | POA: Diagnosis not present

## 2023-09-06 DIAGNOSIS — I4821 Permanent atrial fibrillation: Secondary | ICD-10-CM | POA: Diagnosis not present

## 2023-09-06 DIAGNOSIS — D6869 Other thrombophilia: Secondary | ICD-10-CM | POA: Diagnosis not present

## 2023-09-06 DIAGNOSIS — M48062 Spinal stenosis, lumbar region with neurogenic claudication: Secondary | ICD-10-CM | POA: Diagnosis not present

## 2023-09-06 DIAGNOSIS — M1612 Unilateral primary osteoarthritis, left hip: Secondary | ICD-10-CM | POA: Diagnosis not present

## 2023-10-05 DIAGNOSIS — H5203 Hypermetropia, bilateral: Secondary | ICD-10-CM | POA: Diagnosis not present

## 2023-10-05 DIAGNOSIS — H52223 Regular astigmatism, bilateral: Secondary | ICD-10-CM | POA: Diagnosis not present

## 2023-10-10 ENCOUNTER — Ambulatory Visit (HOSPITAL_COMMUNITY)
Admission: RE | Admit: 2023-10-10 | Discharge: 2023-10-10 | Disposition: A | Discharge status: Home / Self Care | Payer: Medicare Other | Source: Ambulatory Visit | Attending: Internal Medicine | Admitting: Internal Medicine

## 2023-10-10 VITALS — BP 112/62 | HR 87 | Ht 62.0 in | Wt 133.0 lb

## 2023-10-10 DIAGNOSIS — D6869 Other thrombophilia: Secondary | ICD-10-CM | POA: Diagnosis not present

## 2023-10-10 DIAGNOSIS — R600 Localized edema: Secondary | ICD-10-CM

## 2023-10-10 DIAGNOSIS — I4891 Unspecified atrial fibrillation: Secondary | ICD-10-CM | POA: Diagnosis not present

## 2023-10-10 DIAGNOSIS — I4821 Permanent atrial fibrillation: Secondary | ICD-10-CM | POA: Diagnosis not present

## 2023-10-10 MED ORDER — METOPROLOL TARTRATE 25 MG PO TABS: 12.5000 mg | ORAL_TABLET | Freq: Two times a day (BID) | ORAL | 3 refills | Status: DC | PRN

## 2023-10-10 MED ORDER — TORSEMIDE 20 MG PO TABS: 10.0000 mg | ORAL_TABLET | Freq: Every day | ORAL | Status: AC

## 2023-10-10 NOTE — Patient Instructions (Signed)
 Metoprolol  tartrate 12.5mg  twice a day AS NEEDED for heart rates staying over 100

## 2023-10-10 NOTE — Progress Notes (Signed)
 Patient ID: Katrina Velez, female   DOB: 12/12/1925, 89 y.o.   MRN: [id number]       Primary Care Physician: Arcadio Knuckles, MD Referring Physician: Dr. Verdon Glance Melle is a 89 y.o. female with a h/o afib that was loaded on amiodarone  and successfully cardioverted 4/13, but with ERAF. She did not see that much difference in SR and had some thyroid  concerns already on replacement, so decision was made to stop amiodarone  and aim for rate control. HR has been staying below 100 at home. No issues with anticoagulant.   Follow up in Afib clinic, 10/10/23. She is at her baseline in rate controlled permanent Afib. She is doing okay overall. She has been taking half a torsemide  tablet daily because of frequent urination. Similar weight to previous office visit noted. Noted earlier today higher HR than usual which made her feel poorly. No issues with Eliquis  5 mg BID.   Today, she denies symptoms of palpitations, chest pain, shortness of breath, orthopnea, PND, lower extremity edema, dizziness, presyncope, syncope, or neurologic sequela. The patient is tolerating medications without difficulties and is otherwise without complaint today.   Past Medical History:  Diagnosis Date   A-fib (HCC) 05/2015   HOSPITALIZED    Arthritis    Cataract    Hypothyroidism    Permanent atrial fibrillation (HCC)    Thyroid  disease    Past Surgical History:  Procedure Laterality Date   CARDIOVERSION N/A 06/08/2015   Procedure: CARDIOVERSION;  Surgeon: Hugh Madura, MD;  Location: [facility] ENDOSCOPY;  Service: Cardiovascular;  Laterality: N/A;   CARDIOVERSION N/A 09/02/2015   Procedure: CARDIOVERSION;  Surgeon: Flavia Hughs, MD;  Location: [facility] ENDOSCOPY;  Service: Cardiovascular;  Laterality: N/A;   CATARACT EXTRACTION, BILATERAL Bilateral 2010   JOINT REPLACEMENT     KNEE SURGERY  2002   ORIF FEMUR FRACTURE Right 11/18/2020   Procedure: OPEN REDUCTION INTERNAL FIXATION (ORIF) DISTAL FEMUR FRACTURE;  Surgeon:  Laneta Pintos, MD;  Location: [facility] OR;  Service: Orthopedics;  Laterality: Right;   right hip replacement     RIGHT KNEE REPLACEMENT  2011   TEE WITHOUT CARDIOVERSION N/A 06/08/2015   Procedure: TRANSESOPHAGEAL ECHOCARDIOGRAM (TEE);  Surgeon: Hugh Madura, MD;  Location: [facility] ENDOSCOPY;  Service: Cardiovascular;  Laterality: N/A;   TOTAL HIP ARTHROPLASTY Right 2011   TOTAL SHOULDER REPLACEMENT Right 2003   TOTAL SHOULDER REPLACEMENT Left 2007   WRIST SURGERY Bilateral 2008   both wrists    Current Outpatient Medications  Medication Sig Dispense Refill   apixaban  (ELIQUIS ) 5 MG TABS tablet Take 1 tablet (5 mg total) by mouth 2 (two) times daily. 180 tablet 3   Cholecalciferol (VITAMIN D-3 PO) Take 1 tablet by mouth 4 (four) times a week.     Cyanocobalamin  (VITAMIN B-12 PO) Take 1 tablet by mouth every morning.     diltiazem  (CARDIZEM  LA) 120 MG 24 hr tablet Take 1 tablet (120 mg total) by mouth 2 (two) times daily. 180 tablet 3   metoprolol  succinate (TOPROL -XL) 25 MG 24 hr tablet Take 1 tablet by mouth twice a day 180 tablet 3   metoprolol  tartrate (LOPRESSOR ) 25 MG tablet Take 0.5 tablets (12.5 mg total) by mouth 2 (two) times daily as needed (heart rate over 100). 60 tablet 3   mupirocin  ointment (BACTROBAN ) 2 % On leg wound w/dressing change qd or bid 30 g 1   SYNTHROID  125 MCG tablet TAKE 1 TABLET BY MOUTH EVERY DAY 90 tablet  1   Thiamine HCl (VITAMIN B1 PO) Take 1 tablet by mouth every morning.     traMADol  (ULTRAM ) 50 MG tablet TAKE 1 TABLET BY MOUTH EVERY 6 HOURS AS NEEDED. (Patient taking differently: Take 50 mg by mouth as needed.) 120 tablet 2   torsemide  (DEMADEX ) 20 MG tablet Take 0.5 tablets (10 mg total) by mouth daily. Schedule an appt for further refills     No current facility-administered medications for this encounter.    Allergies  Allergen Reactions   Banana Nausea Only    All banana products also   Penicillins Hives   ROS- All systems are reviewed and negative  except as per the HPI above  Physical Exam: Vitals:   10/10/23 1132  BP: 112/62  Pulse: 87  Weight: 60.3 kg  Height: 5\' 2"  (1.575 m)    GEN- The patient is well appearing, alert and oriented x 3 today.   Neck - no JVD or carotid bruit noted Lungs- Clear to ausculation bilaterally, normal work of breathing Heart- Irregular rate and rhythm, no murmurs, rubs or gallops, PMI not laterally displaced Extremities- no clubbing, cyanosis, or edema Skin - no rash or ecchymosis noted   EKG-  Vent. rate 87 BPM PR interval * ms QRS duration 84 ms QT/QTcB 360/433 ms P-R-T axes * 78 -7 Atrial fibrillation Low voltage QRS Nonspecific ST and T wave abnormality Abnormal ECG When compared with ECG of 04-Apr-2023 11:34, No significant change was found  Epic records reviewed   Assessment and Plan: Permanent afib  She is in permanent Afib, overall well controlled heart rate. Continue diltiazem  120 mg BID and Toprol  25 mg BID. For elevated HR, will prescribe Lopressor  12.5 mg BID PRN. Okay to continue torsemide  10 mg daily; weight similar to previous visit which is reassuring.   We will continue with office visits every 6 months per patient request.   Continue Eliquis  5 mg BID; dosage is correct based on weight > 60 kg and creatinine below 1.5.     Follow up 6 months Afib clinic.   Minnie Amber, PA-C [facility] [address] [place], [place] [address] [phone]

## 2023-10-19 ENCOUNTER — Telehealth: Payer: Self-pay | Admitting: Internal Medicine

## 2023-10-19 NOTE — Telephone Encounter (Signed)
 Home Health has faxed the orders and I will have them signed. They have been made aware that Dr. Rochelle Chu will not be back in office until Monday.

## 2023-10-19 NOTE — Telephone Encounter (Signed)
 Copied from CRM [id number]. Topic: Clinical - Home Health Verbal Orders >> Oct 19, 2023 11:54 AM Dimple Francis wrote: Reason for CRM: Caryl Clas- # [id number]  [facility] needs home health orders from 2/04 to be signed and sent back

## 2023-11-20 ENCOUNTER — Ambulatory Visit (INDEPENDENT_AMBULATORY_CARE_PROVIDER_SITE_OTHER): Admitting: Emergency Medicine

## 2023-11-20 ENCOUNTER — Encounter: Payer: Self-pay | Admitting: Emergency Medicine

## 2023-11-20 ENCOUNTER — Ambulatory Visit (INDEPENDENT_AMBULATORY_CARE_PROVIDER_SITE_OTHER)

## 2023-11-20 VITALS — BP 102/62 | HR 81 | Temp 98.1°F | Ht 62.0 in | Wt 133.0 lb

## 2023-11-20 VITALS — BP 102/62 | HR 81 | Ht 62.0 in | Wt 133.0 lb

## 2023-11-20 DIAGNOSIS — Z0000 Encounter for general adult medical examination without abnormal findings: Secondary | ICD-10-CM | POA: Diagnosis not present

## 2023-11-20 DIAGNOSIS — D698 Other specified hemorrhagic conditions: Secondary | ICD-10-CM | POA: Insufficient documentation

## 2023-11-20 DIAGNOSIS — L818 Other specified disorders of pigmentation: Secondary | ICD-10-CM | POA: Diagnosis not present

## 2023-11-20 DIAGNOSIS — Z7901 Long term (current) use of anticoagulants: Secondary | ICD-10-CM | POA: Insufficient documentation

## 2023-11-20 NOTE — Patient Instructions (Addendum)
 Katrina Velez , Thank you for taking time out of your busy schedule to complete your Annual Wellness Visit with me. I enjoyed our conversation and look forward to speaking with you again next year. I, as well as your care team,  appreciate your ongoing commitment to your health goals. Please review the following plan we discussed and let me know if I can assist you in the future. Your Game plan/ To Do List    Follow up Visits: Next Medicare AWV with our clinical staff: 11/27/2024   Have you seen your provider in the last 6 months (3 months if uncontrolled diabetes)? No Next Office Visit with your provider: to be scheduled  Clinician Recommendations:  Aim for 30 minutes of exercise or brisk walking, 6-8 glasses of water, and 5 servings of fruits and vegetables each day. Educated and advised on getting the COVID vaccine.      This is a list of the screening recommended for you and due dates:  Health Maintenance  Topic Date Due   COVID-19 Vaccine (6 - Pfizer risk 2024-25 season) 07/26/2023   Flu Shot  12/21/2023   DTaP/Tdap/Td vaccine (2 - Td or Tdap) 10/11/2026   Pneumococcal Vaccine for age over 64  Completed   DEXA scan (bone density measurement)  Completed   Zoster (Shingles) Vaccine  Completed   Hepatitis B Vaccine  Aged Out   HPV Vaccine  Aged Out   Meningitis B Vaccine  Aged Out    Advanced directives: (In Chart) A copy of your advanced directives are scanned into your chart should your provider ever need it. Advance Care Planning is important because it:  [x]  Makes sure you receive the medical care that is consistent with your values, goals, and preferences  [x]  It provides guidance to your family and loved ones and reduces their decisional burden about whether or not they are making the right decisions based on your wishes.  Follow the link provided in your after visit summary or read over the paperwork we have mailed to you to help you started getting your Advance Directives in  place. If you need assistance in completing these, please reach out to us  so that we can help you!  Managing Pain Without Opioids Opioids are strong medicines used to treat moderate to severe pain. For some people, especially those who have long-term (chronic) pain, opioids may not be the best choice for pain management due to: Side effects like nausea, constipation, and sleepiness. The risk of addiction (opioid use disorder). The longer you take opioids, the greater your risk of addiction. Pain that lasts for more than 3 months is called chronic pain. Managing chronic pain usually requires more than one approach and is often provided by a team of health care providers working together (multidisciplinary approach). Pain management may be done at a pain management center or pain clinic. How to manage pain without the use of opioids Use non-opioid medicines Non-opioid medicines for pain may include: Over-the-counter or prescription non-steroidal anti-inflammatory drugs (NSAIDs). These may be the first medicines used for pain. They work well for muscle and bone pain, and they reduce swelling. Acetaminophen . This over-the-counter medicine may work well for milder pain but not swelling. Antidepressants. These may be used to treat chronic pain. A certain type of antidepressant (tricyclics) is often used. These medicines are given in lower doses for pain than when used for depression. Anticonvulsants. These are usually used to treat seizures but may also reduce nerve (neuropathic) pain. Muscle relaxants.  These relieve pain caused by sudden muscle tightening (spasms). You may also use a pain medicine that is applied to the skin as a patch, cream, or gel (topical analgesic), such as a numbing medicine. These may cause fewer side effects than medicines taken by mouth. Do certain therapies as directed Some therapies can help with pain management. They include: Physical therapy. You will do exercises to gain  strength and flexibility. A physical therapist may teach you exercises to move and stretch parts of your body that are weak, stiff, or painful. You can learn these exercises at physical therapy visits and practice them at home. Physical therapy may also involve: Massage. Heat wraps or applying heat or cold to affected areas. Electrical signals that interrupt pain signals (transcutaneous electrical nerve stimulation, TENS). Weak lasers that reduce pain and swelling (low-level laser therapy). Signals from your body that help you learn to regulate pain (biofeedback). Occupational therapy. This helps you to learn ways to function at home and work with less pain. Recreational therapy. This involves trying new activities or hobbies, such as a physical activity or drawing. Mental health therapy, including: Cognitive behavioral therapy (CBT). This helps you learn coping skills for dealing with pain. Acceptance and commitment therapy (ACT) to change the way you think and react to pain. Relaxation therapies, including muscle relaxation exercises and mindfulness-based stress reduction. Pain management counseling. This may be individual, family, or group counseling.  Receive medical treatments Medical treatments for pain management include: Nerve block injections. These may include a pain blocker and anti-inflammatory medicines. You may have injections: Near the spine to relieve chronic back or neck pain. Into joints to relieve back or joint pain. Into nerve areas that supply a painful area to relieve body pain. Into muscles (trigger point injections) to relieve some painful muscle conditions. A medical device placed near your spine to help block pain signals and relieve nerve pain or chronic back pain (spinal cord stimulation device). Acupuncture. Follow these instructions at home Medicines Take over-the-counter and prescription medicines only as told by your health care provider. If you are taking  pain medicine, ask your health care providers about possible side effects to watch out for. Do not drive or use heavy machinery while taking prescription opioid pain medicine. Lifestyle  Do not use drugs or alcohol  to reduce pain. If you drink alcohol , limit how much you have to: 0-1 drink a day for women who are not pregnant. 0-2 drinks a day for men. Know how much alcohol  is in a drink. In the [place] one drink equals one 12 oz bottle of beer (355 mL), one 5 oz glass of wine (148 mL), or one 1 oz glass of hard liquor (44 mL). Do not use any products that contain nicotine or tobacco. These products include cigarettes, chewing tobacco, and vaping devices, such as e-cigarettes. If you need help quitting, ask your health care provider. Eat a healthy diet and maintain a healthy weight. Poor diet and excess weight may make pain worse. Eat foods that are high in fiber. These include fresh fruits and vegetables, whole grains, and beans. Limit foods that are high in fat and processed sugars, such as fried and sweet foods. Exercise regularly. Exercise lowers stress and may help relieve pain. Ask your health care provider what activities and exercises are safe for you. If your health care provider approves, join an exercise class that combines movement and stress reduction. Examples include yoga and tai chi. Get enough sleep. Lack of sleep may make  pain worse. Lower stress as much as possible. Practice stress reduction techniques as told by your therapist. General instructions Work with all your pain management providers to find the treatments that work best for you. You are an important member of your pain management team. There are many things you can do to reduce pain on your own. Consider joining an online or in-person support group for people who have chronic pain. Keep all follow-up visits. This is important. Where to find more information You can find more information about managing pain without  opioids from: American Academy of Pain Medicine: painmed.org Institute for Chronic Pain: instituteforchronicpain.org American Chronic Pain Association: theacpa.org Contact a health care provider if: You have side effects from pain medicine. Your pain gets worse or does not get better with treatments or home therapy. You are struggling with anxiety or depression. Summary Many types of pain can be managed without opioids. Chronic pain may respond better to pain management without opioids. Pain is best managed when you and a team of health care providers work together. Pain management without opioids may include non-opioid medicines, medical treatments, physical therapy, mental health therapy, and lifestyle changes. Tell your health care providers if your pain gets worse or is not being managed well enough. This information is not intended to replace advice given to you by your health care provider. Make sure you discuss any questions you have with your health care provider. Document Revised: 08/18/2020 Document Reviewed: 08/18/2020 Elsevier Patient Education  2024 [employer].

## 2023-11-20 NOTE — Patient Instructions (Signed)
 Health Maintenance After Age 88 After age 4, you are at a higher risk for certain long-term diseases and infections as well as injuries from falls. Falls are a major cause of broken bones and head injuries in people who are older than age 47. Getting regular preventive care can help to keep you healthy and well. Preventive care includes getting regular testing and making lifestyle changes as recommended by your health care provider. Talk with your health care provider about: Which screenings and tests you should have. A screening is a test that checks for a disease when you have no symptoms. A diet and exercise plan that is right for you. What should I know about screenings and tests to prevent falls? Screening and testing are the best ways to find a health problem early. Early diagnosis and treatment give you the best chance of managing medical conditions that are common after age 37. Certain conditions and lifestyle choices may make you more likely to have a fall. Your health care provider may recommend: Regular vision checks. Poor vision and conditions such as cataracts can make you more likely to have a fall. If you wear glasses, make sure to get your prescription updated if your vision changes. Medicine review. Work with your health care provider to regularly review all of the medicines you are taking, including over-the-counter medicines. Ask your health care provider about any side effects that may make you more likely to have a fall. Tell your health care provider if any medicines that you take make you feel dizzy or sleepy. Strength and balance checks. Your health care provider may recommend certain tests to check your strength and balance while standing, walking, or changing positions. Foot health exam. Foot pain and numbness, as well as not wearing proper footwear, can make you more likely to have a fall. Screenings, including: Osteoporosis screening. Osteoporosis is a condition that causes  the bones to get weaker and break more easily. Blood pressure screening. Blood pressure changes and medicines to control blood pressure can make you feel dizzy. Depression screening. You may be more likely to have a fall if you have a fear of falling, feel depressed, or feel unable to do activities that you used to do. Alcohol use screening. Using too much alcohol can affect your balance and may make you more likely to have a fall. Follow these instructions at home: Lifestyle Do not drink alcohol if: Your health care provider tells you not to drink. If you drink alcohol: Limit how much you have to: 0-1 drink a day for women. 0-2 drinks a day for men. Know how much alcohol is in your drink. In the [place] one drink equals one 12 oz bottle of beer (355 mL), one 5 oz glass of wine (148 mL), or one 1 oz glass of hard liquor (44 mL). Do not use any products that contain nicotine or tobacco. These products include cigarettes, chewing tobacco, and vaping devices, such as e-cigarettes. If you need help quitting, ask your health care provider. Activity  Follow a regular exercise program to stay fit. This will help you maintain your balance. Ask your health care provider what types of exercise are appropriate for you. If you need a cane or walker, use it as recommended by your health care provider. Wear supportive shoes that have nonskid soles. Safety  Remove any tripping hazards, such as rugs, cords, and clutter. Install safety equipment such as grab bars in bathrooms and safety rails on stairs. Keep rooms and walkways  well-lit. General instructions Talk with your health care provider about your risks for falling. Tell your health care provider if: You fall. Be sure to tell your health care provider about all falls, even ones that seem minor. You feel dizzy, tiredness (fatigue), or off-balance. Take over-the-counter and prescription medicines only as told by your health care provider. These include  supplements. Eat a healthy diet and maintain a healthy weight. A healthy diet includes low-fat dairy products, low-fat (lean) meats, and fiber from whole grains, beans, and lots of fruits and vegetables. Stay current with your vaccines. Schedule regular health, dental, and eye exams. Summary Having a healthy lifestyle and getting preventive care can help to protect your health and wellness after age 11. Screening and testing are the best way to find a health problem early and help you avoid having a fall. Early diagnosis and treatment give you the best chance for managing medical conditions that are more common for people who are older than age 28. Falls are a major cause of broken bones and head injuries in people who are older than age 48. Take precautions to prevent a fall at home. Work with your health care provider to learn what changes you can make to improve your health and wellness and to prevent falls. This information is not intended to replace advice given to you by your health care provider. Make sure you discuss any questions you have with your health care provider. Document Revised: 09/27/2020 Document Reviewed: 09/27/2020 Elsevier Patient Education  2024 [employer].

## 2023-11-20 NOTE — Assessment & Plan Note (Signed)
 Secondary to chronic capillary leakage Chronic condition.  No complications

## 2023-11-20 NOTE — Assessment & Plan Note (Signed)
 For chronic persistent atrial fibrillation No complications

## 2023-11-20 NOTE — Progress Notes (Addendum)
 Subjective:   Katrina Velez is a 89 y.o. who presents for a Medicare Wellness preventive visit.  As a reminder, Annual Wellness Visits don't include a physical exam, and some assessments may be limited, especially if this visit is performed virtually. We may recommend an in-person follow-up visit with your provider if needed.  Visit Complete: In person  Persons Participating in Visit: Patient. With Dtr, Inocente Osier  AWV Questionnaire: No: Patient Medicare AWV questionnaire was not completed prior to this visit.  Cardiac Risk Factors include: advanced age (>31men, >74 women)     Objective:    Today's Vitals   11/20/23 1020  BP: 102/62  Pulse: 81  SpO2: 94%  Weight: 133 lb (60.3 kg)  Height: 5' 2 (1.575 m)   Body mass index is 24.33 kg/m.     11/20/2023   10:20 AM 05/06/2023   12:54 PM 03/02/2022   11:18 AM 11/16/2020   10:00 AM 11/15/2020   11:58 PM 11/08/2020    9:48 AM 11/27/2019    3:02 PM  Advanced Directives  Does Patient Have a Medical Advance Directive? Yes Yes Yes Yes Yes Yes   Type of Estate agent of [place];Living will Healthcare Power of [place];Living will Healthcare Power of [place];Living will Healthcare Power of [place];Living will  Healthcare Power of [place];Living will Healthcare Power of [place];Out of facility DNR (pink MOST or yellow form);Living will  Does patient want to make changes to medical advance directive? No - Patient declined No - Patient declined No - Patient declined No - Patient declined  No - Patient declined No - Patient declined  Copy of Healthcare Power of Attorney in Chart? Yes - validated most recent copy scanned in chart (See row information) No - copy available, Physician notified Yes - validated most recent copy scanned in chart (See row information) Yes - validated most recent copy scanned in chart (See row information)  Yes - validated most recent copy scanned in chart (See row information) No - copy  requested    Current Medications (verified) Outpatient Encounter Medications as of 11/20/2023  Medication Sig   apixaban  (ELIQUIS ) 5 MG TABS tablet Take 1 tablet (5 mg total) by mouth 2 (two) times daily.   Cholecalciferol (VITAMIN D-3 PO) Take 1 tablet by mouth 4 (four) times a week.   Cyanocobalamin  (VITAMIN B-12 PO) Take 1 tablet by mouth every morning.   diltiazem  (CARDIZEM  LA) 120 MG 24 hr tablet Take 1 tablet (120 mg total) by mouth 2 (two) times daily.   metoprolol  succinate (TOPROL -XL) 25 MG 24 hr tablet Take 1 tablet by mouth twice a day   metoprolol  tartrate (LOPRESSOR ) 25 MG tablet Take 0.5 tablets (12.5 mg total) by mouth 2 (two) times daily as needed (heart rate over 100).   mupirocin  ointment (BACTROBAN ) 2 % On leg wound w/dressing change qd or bid   SYNTHROID  125 MCG tablet TAKE 1 TABLET BY MOUTH EVERY DAY   Thiamine HCl (VITAMIN B1 PO) Take 1 tablet by mouth every morning.   torsemide  (DEMADEX ) 20 MG tablet Take 0.5 tablets (10 mg total) by mouth daily. Schedule an appt for further refills   traMADol  (ULTRAM ) 50 MG tablet TAKE 1 TABLET BY MOUTH EVERY 6 HOURS AS NEEDED.   No facility-administered encounter medications on file as of 11/20/2023.    Allergies (verified) Banana and Penicillins   History: Past Medical History:  Diagnosis Date   A-fib (HCC) 05/2015   HOSPITALIZED    Arthritis  Cataract    Hypothyroidism    Permanent atrial fibrillation (HCC)    Thyroid  disease    Past Surgical History:  Procedure Laterality Date   CARDIOVERSION N/A 06/08/2015   Procedure: CARDIOVERSION;  Surgeon: Oneil JAYSON Parchment, MD;  Location: [facility] ENDOSCOPY;  Service: Cardiovascular;  Laterality: N/A;   CARDIOVERSION N/A 09/02/2015   Procedure: CARDIOVERSION;  Surgeon: Pearla DELENA Rout, MD;  Location: [facility] ENDOSCOPY;  Service: Cardiovascular;  Laterality: N/A;   CATARACT EXTRACTION, BILATERAL Bilateral 2010   JOINT REPLACEMENT     KNEE SURGERY  2002   ORIF FEMUR FRACTURE Right  11/18/2020   Procedure: OPEN REDUCTION INTERNAL FIXATION (ORIF) DISTAL FEMUR FRACTURE;  Surgeon: Kendal Franky SQUIBB, MD;  Location: [facility] OR;  Service: Orthopedics;  Laterality: Right;   right hip replacement     RIGHT KNEE REPLACEMENT  2011   TEE WITHOUT CARDIOVERSION N/A 06/08/2015   Procedure: TRANSESOPHAGEAL ECHOCARDIOGRAM (TEE);  Surgeon: Oneil JAYSON Parchment, MD;  Location[facility] ENDOSCOPY;  Service: Cardiovascular;  Laterality: N/A;   TOTAL HIP ARTHROPLASTY Right 2011   TOTAL SHOULDER REPLACEMENT Right 2003   TOTAL SHOULDER REPLACEMENT Left 2007   WRIST SURGERY Bilateral 2008   both wrists   Family History  Problem Relation Age of Onset   Hypercalcemia Daughter    Hypertension Daughter    Social History   Socioeconomic History   Marital status: Widowed    Spouse name: Not on file   Number of children: Not on file   Years of education: Not on file   Highest education level: Not on file  Occupational History   Not on file  Tobacco Use   Smoking status: Former    Current packs/day: 0.00    Average packs/day: 1 pack/day for 65.0 years (65.0 ttl pk-yrs)    Types: Cigarettes    Start date: 05/27/1944    Quit date: 05/28/2011    Years since quitting: 12.4   Smokeless tobacco: Never  Vaping Use   Vaping status: Some Days   Substances: Nicotine  Substance and Sexual Activity   Alcohol  use: Not Currently   Drug use: No   Sexual activity: Not Currently  Other Topics Concern   Not on file  Social History Narrative   DIET: NONE      DO YOU DRINK/EAT THINGS WITH CAFFEINE: YES, COFFEE AND TEA       MARITAL STATUS: WIDOWED      WHAT YEAR WERE YOU MARRIED:1950      DO YOU LIVE IN A HOUSE, APARTMENT, ASSISTED LIVING, CONDO TRAILER ETC.: HOUSE       IS IT ONE OR MORE STORIES: 2 STORIES      HOW MANY PERSONS LIVE IN YOUR HOME: 1      DO YOU HAVE PETS IN YOUR HOME: 1 CAT AND A PART TIME DOG       CURRENT OR PAST PROFESSION: CIVIL SERVANT      DO YOU EXERCISE: NO       WHAT TYPE AND HOW  OFTEN:NO   Social Drivers of Health   Financial Resource Strain: Low Risk  (11/20/2023)   Overall Financial Resource Strain (CARDIA)    Difficulty of Paying Living Expenses: Not hard at all  Food Insecurity: No Food Insecurity (11/20/2023)   Hunger Vital Sign    Worried About Running Out of Food in the Last Year: Never true    Ran Out of Food in the Last Year: Never true  Transportation Needs: No Transportation Needs (11/20/2023)  PRAPARE - Administrator, Civil Service (Medical): No    Lack of Transportation (Non-Medical): No  Physical Activity: Inactive (11/20/2023)   Exercise Vital Sign    Days of Exercise per Week: 0 days    Minutes of Exercise per Session: 0 min  Stress: No Stress Concern Present (11/20/2023)   [employer] of Occupational Health - Occupational Stress Questionnaire    Feeling of Stress: Only a little  Social Connections: Socially Isolated (11/20/2023)   Social Connection and Isolation Panel    Frequency of Communication with Friends and Family: More than three times a week    Frequency of Social Gatherings with Friends and Family: More than three times a week    Attends Religious Services: Never    Database administrator or Organizations: No    Attends Banker Meetings: Never    Marital Status: Widowed    Tobacco Counseling Counseling given: No    Clinical Intake:  Pre-visit preparation completed: Yes  Pain : No/denies pain     BMI - recorded: 24.33 Nutritional Status: BMI of 19-24  Normal Nutritional Risks: None Diabetes: No  No results found for: HGBA1C   How often do you need to have someone help you when you read instructions, pamphlets, or other written materials from your doctor or pharmacy?: 3 - Sometimes (and family helps)  Interpreter Needed?: No  Information entered by :: Verdie Saba, CMA   Activities of Daily Living     11/20/2023   10:26 AM  In your present state of health, do you have any difficulty  performing the following activities:  Hearing? 1  Comment wears hearing aids  Vision? 0  Difficulty concentrating or making decisions? 0  Walking or climbing stairs? 1  Comment uses a walker  Dressing or bathing? 1  Comment dtr helps  Doing errands, shopping? 1  Comment Dtr/son helps  Quarry manager and eating ? N  Using the Toilet? N  In the past six months, have you accidently leaked urine? Y  Comment wears a depend/pad  Do you have problems with loss of bowel control? N  Managing your Medications? Y  Managing your Finances? Y  Comment Dtr helps  Housekeeping or managing your Housekeeping? Y    Patient Care Team: Joshua Debby CROME, MD as PCP - General (Internal Medicine) Dow Arland BROCKS, NP (Inactive) as Nurse Practitioner (Cardiology)  I have updated your Care Teams any recent Medical Services you may have received from other providers in the past year.     Assessment:   This is a routine wellness examination for Delaney.  Hearing/Vision screen Hearing Screening - Comments:: Wears hearing aids Vision Screening - Comments:: Wears rx glasses - up to date with routine eye exams with [facility]   Goals Addressed               This Visit's Progress     Patient Stated (pt-stated)        Patient stated she moves her legs often to keep the circulation       Depression Screen     11/20/2023   10:30 AM 05/22/2023   12:06 PM 04/30/2023    4:31 PM 03/02/2022   11:53 AM 08/15/2021   11:08 AM 11/27/2019    3:00 PM 11/05/2019   10:37 AM  PHQ 2/9 Scores  PHQ - 2 Score 0 0 1 0 0 0 0  PHQ- 9 Score 3    0  Fall Risk     11/20/2023   10:29 AM 05/22/2023   12:06 PM 04/30/2023    4:31 PM 03/02/2022   11:31 AM 08/15/2021   11:08 AM  Fall Risk   Falls in the past year? 1 0 0 0 0  Number falls in past yr: -- 0 0 0   Comment 1      Injury with Fall? 0 0 0 0   Risk for fall due to : History of fall(s);Impaired balance/gait No Fall Risks Impaired balance/gait No Fall Risks    Follow up Falls evaluation completed;Falls prevention discussed Falls evaluation completed Falls evaluation completed Falls prevention discussed       Data saved with a previous flowsheet row definition    MEDICARE RISK AT HOME:  Medicare Risk at Home Any stairs in or around the home?: Yes (outside) If so, are there any without handrails?: No Home free of loose throw rugs in walkways, pet beds, electrical cords, etc?: Yes Adequate lighting in your home to reduce risk of falls?: Yes Life alert?: Yes Use of a cane, walker or w/c?: Yes (walker) Grab bars in the bathroom?: Yes Shower chair or bench in shower?: Yes Elevated toilet seat or a handicapped toilet?: Yes  TIMED UP AND GO:  Was the test performed?  No  Cognitive Function: 6CIT completed    10/01/2017   10:38 AM 09/29/2016   10:22 AM  MMSE - Mini Mental State Exam  Orientation to time 5 5   Orientation to Place 5 5   Registration 3 3   Attention/ Calculation 5 5   Recall 2 2   Language- name 2 objects 2 2   Language- repeat 1 1  Language- follow 3 step command 3 3   Language- read & follow direction 1 1   Write a sentence 1 1   Copy design 1 1   Total score 29 29      Data saved with a previous flowsheet row definition        11/20/2023   10:35 AM 03/02/2022   11:56 AM 11/05/2019   10:37 AM 10/04/2018   11:45 AM  6CIT Screen  What Year? 0 points 0 points 0 points 0 points  What month? 0 points 0 points 0 points 0 points  What time? 0 points 0 points 0 points 0 points  Count back from 20 0 points 0 points 0 points 0 points  Months in reverse 0 points 0 points 0 points 0 points  Repeat phrase 0 points 0 points 2 points 2 points  Total Score 0 points 0 points 2 points 2 points    Immunizations Immunization History  Administered Date(s) Administered   Fluad Quad(high Dose 65+) 03/15/2019, 03/14/2022   Fluad Trivalent(High Dose 65+) 02/21/2023   Influenza Split 03/25/2012   Influenza, High Dose Seasonal PF  03/28/2018, 02/20/2020   Influenza,inj,Quad PF,6+ Mos 02/24/2013, 03/15/2015, 02/25/2016   Influenza-Unspecified 05/01/2014, 03/19/2017   PFIZER(Purple Top)SARS-COV-2 Vaccination 06/03/2019, 06/22/2019, 02/15/2020, 09/03/2020   Pfizer(Comirnaty)Fall Seasonal Vaccine 12 years and older 01/26/2023   Pneumococcal Conjugate-13 05/27/2014   Pneumococcal Polysaccharide-23 02/25/2016, 03/14/2022   Tdap 10/10/2016   Zoster Recombinant(Shingrix) 08/31/2017, 11/07/2017   Zoster, Live 05/22/2001    Screening Tests Health Maintenance  Topic Date Due   COVID-19 Vaccine (6 - Pfizer risk 2024-25 season) 07/26/2023   INFLUENZA VACCINE  12/21/2023   DTaP/Tdap/Td (2 - Td or Tdap) 10/11/2026   Pneumococcal Vaccine: 50+ Years  Completed   DEXA SCAN  Completed   Zoster Vaccines- Shingrix  Completed   Hepatitis B Vaccines  Aged Out   HPV VACCINES  Aged Out   Meningococcal B Vaccine  Aged Out    Health Maintenance  Health Maintenance Due  Topic Date Due   COVID-19 Vaccine (6 - Pfizer risk 2024-25 season) 07/26/2023   Health Maintenance Items Addressed:11/20/2023  Additional Screening:  Vision Screening: Recommended annual ophthalmology exams for early detection of glaucoma and other disorders of the eye. Would you like a referral to an eye doctor? No  Patient stated had eye exam w/[facility] in 2025.  Dental Screening: Recommended annual dental exams for proper oral hygiene  Community Resource Referral / Chronic Care Management: CRR required this visit?  No   CCM required this visit?  No   Plan:    I have personally reviewed and noted the following in the patient's chart:   Medical and social history Use of alcohol , tobacco or illicit drugs  Current medications and supplements including opioid prescriptions. Patient is currently taking opioid prescriptions. Information provided to patient regarding non-opioid alternatives. Patient advised to discuss non-opioid treatment plan with their  provider. Functional ability and status Nutritional status Physical activity Advanced directives List of other physicians Hospitalizations, surgeries, and ER visits in previous 12 months Vitals Screenings to include cognitive, depression, and falls Referrals and appointments  In addition, I have reviewed and discussed with patient certain preventive protocols, quality metrics, and best practice recommendations. A written personalized care plan for preventive services as well as general preventive health recommendations were provided to patient.   Verdie CHRISTELLA Saba, CMA   11/20/2023   After Visit Summary: (In Person-Declined) Patient declined AVS at this time.  Notes: Nothing significant to report at this time.

## 2023-11-20 NOTE — Assessment & Plan Note (Signed)
 Secondary to aging process and also long-term Eliquis  use Occasional bumps contributing to bruising No complications or concerns today.

## 2023-11-20 NOTE — Progress Notes (Signed)
 Katrina Velez 89 y.o.   Chief Complaint  Patient presents with   Leg Problem    Patient is HOH, states she's been having leg problems but the redness is concerning for her leg redness and darken and no one has been able to tell her what exactly it is. She states left leg is pain started at her knee the last couple of weeks and has moved up to her hip     HISTORY OF PRESENT ILLNESS: Acute problem visit today.  Patient of Dr. Debby Molt. This is a 89 y.o. female accompanied by daughter complaining of bruising and redness to both lower legs for weeks No other associated symptoms Patient has history of atrial fibrillation on Eliquis   HPI   Prior to Admission medications   Medication Sig Start Date End Date Taking? Authorizing Provider  apixaban  (ELIQUIS ) 5 MG TABS tablet Take 1 tablet (5 mg total) by mouth 2 (two) times daily. 03/29/23  Yes Terra Fairy PARAS, PA-C  Cholecalciferol (VITAMIN D-3 PO) Take 1 tablet by mouth 4 (four) times a week.   Yes [provider]  Cyanocobalamin  (VITAMIN B-12 PO) Take 1 tablet by mouth every morning.   Yes [provider]  diltiazem  (CARDIZEM  LA) 120 MG 24 hr tablet Take 1 tablet (120 mg total) by mouth 2 (two) times daily. 03/29/23  Yes Terra Fairy PARAS, PA-C  metoprolol  succinate (TOPROL -XL) 25 MG 24 hr tablet Take 1 tablet by mouth twice a day 03/29/23  Yes Terra Fairy PARAS, PA-C  metoprolol  tartrate (LOPRESSOR ) 25 MG tablet Take 0.5 tablets (12.5 mg total) by mouth 2 (two) times daily as needed (heart rate over 100). 10/10/23 01/08/24 Yes Terra Fairy PARAS, PA-C  mupirocin  ointment (BACTROBAN ) 2 % On leg wound w/dressing change qd or bid 05/22/23  Yes Plotnikov, Aleksei V, MD  SYNTHROID  125 MCG tablet TAKE 1 TABLET BY MOUTH EVERY DAY 08/31/23  Yes Molt Debby CROME, MD  Thiamine HCl (VITAMIN B1 PO) Take 1 tablet by mouth every morning.   Yes [provider]  torsemide  (DEMADEX ) 20 MG tablet Take 0.5 tablets (10 mg total) by mouth  daily. Schedule an appt for further refills 10/10/23  Yes Terra Fairy PARAS, PA-C  traMADol  (ULTRAM ) 50 MG tablet TAKE 1 TABLET BY MOUTH EVERY 6 HOURS AS NEEDED. 08/08/22  Yes Molt Debby CROME, MD    Allergies  Allergen Reactions   Banana Nausea Only    All banana products also   Penicillins Hives    Patient Active Problem List   Diagnosis Date Noted   Capillary fragility (HCC) 11/20/2023   Hemosiderin pigmentation of skin 11/20/2023   Anticoagulated on Eliquis  11/20/2023   Right leg pain 05/22/2023   Hypercoagulable state due to permanent atrial fibrillation (HCC) 10/04/2022   Permanent atrial fibrillation (HCC)    Encounter for palliative care involving management of pain 08/15/2021   Primary osteoarthritis of left hip 08/15/2021   Spinal stenosis of lumbar region with neurogenic claudication 05/11/2017   Seasonal allergic rhinitis due to pollen 10/04/2016   High risk medication use 10/04/2016   Hypothyroidism post removal of goiter 01/31/2012    Past Medical History:  Diagnosis Date   A-fib (HCC) 05/2015   HOSPITALIZED    Arthritis    Cataract    Hypothyroidism    Permanent atrial fibrillation (HCC)    Thyroid  disease     Past Surgical History:  Procedure Laterality Date   CARDIOVERSION N/A 06/08/2015   Procedure: CARDIOVERSION;  Surgeon: Oneil BROCKS  Jeffrie, MD;  Location: [facility] ENDOSCOPY;  Service: Cardiovascular;  Laterality: N/A;   CARDIOVERSION N/A 09/02/2015   Procedure: CARDIOVERSION;  Surgeon: Pearla DELENA Rout, MD;  Location: [facility] ENDOSCOPY;  Service: Cardiovascular;  Laterality: N/A;   CATARACT EXTRACTION, BILATERAL Bilateral 2010   JOINT REPLACEMENT     KNEE SURGERY  2002   ORIF FEMUR FRACTURE Right 11/18/2020   Procedure: OPEN REDUCTION INTERNAL FIXATION (ORIF) DISTAL FEMUR FRACTURE;  Surgeon: Kendal Franky SQUIBB, MD;  Location: [facility] OR;  Service: Orthopedics;  Laterality: Right;   right hip replacement     RIGHT KNEE REPLACEMENT  2011   TEE WITHOUT CARDIOVERSION N/A  06/08/2015   Procedure: TRANSESOPHAGEAL ECHOCARDIOGRAM (TEE);  Surgeon: Oneil JAYSON Jeffrie, MD;  Location: [facility] ENDOSCOPY;  Service: Cardiovascular;  Laterality: N/A;   TOTAL HIP ARTHROPLASTY Right 2011   TOTAL SHOULDER REPLACEMENT Right 2003   TOTAL SHOULDER REPLACEMENT Left 2007   WRIST SURGERY Bilateral 2008   both wrists    Social History   Socioeconomic History   Marital status: Widowed    Spouse name: Not on file   Number of children: Not on file   Years of education: Not on file   Highest education level: Not on file  Occupational History   Not on file  Tobacco Use   Smoking status: Former    Current packs/day: 0.00    Average packs/day: 1 pack/day for 65.0 years (65.0 ttl pk-yrs)    Types: Cigarettes    Start date: 05/27/1944    Quit date: 05/28/2011    Years since quitting: 12.4   Smokeless tobacco: Never  Vaping Use   Vaping status: Some Days   Substances: Nicotine  Substance and Sexual Activity   Alcohol  use: Not Currently   Drug use: No   Sexual activity: Not Currently  Other Topics Concern   Not on file  Social History Narrative   DIET: NONE      DO YOU DRINK/EAT THINGS WITH CAFFEINE: YES, COFFEE AND TEA       MARITAL STATUS: WIDOWED      WHAT YEAR WERE YOU MARRIED:1950      DO YOU LIVE IN A HOUSE, APARTMENT, ASSISTED LIVING, CONDO TRAILER ETC.: HOUSE       IS IT ONE OR MORE STORIES: 2 STORIES      HOW MANY PERSONS LIVE IN YOUR HOME: 1      DO YOU HAVE PETS IN YOUR HOME: 1 CAT AND A PART TIME DOG       CURRENT OR PAST PROFESSION: CIVIL SERVANT      DO YOU EXERCISE: NO       WHAT TYPE AND HOW OFTEN:NO   Social Drivers of Health   Financial Resource Strain: Low Risk  (11/20/2023)   Overall Financial Resource Strain (CARDIA)    Difficulty of Paying Living Expenses: Not hard at all  Food Insecurity: No Food Insecurity (11/20/2023)   Hunger Vital Sign    Worried About Running Out of Food in the Last Year: Never true    Ran Out of Food in the Last Year:  Never true  Transportation Needs: No Transportation Needs (11/20/2023)   PRAPARE - Administrator, Civil Service (Medical): No    Lack of Transportation (Non-Medical): No  Physical Activity: Inactive (11/20/2023)   Exercise Vital Sign    Days of Exercise per Week: 0 days    Minutes of Exercise per Session: 0 min  Stress: No Stress Concern Present (11/20/2023)   [place]  [employer] - Occupational Stress Questionnaire    Feeling of Stress: Only a little  Social Connections: Socially Isolated (11/20/2023)   Social Connection and Isolation Panel    Frequency of Communication with Friends and Family: More than three times a week    Frequency of Social Gatherings with Friends and Family: More than three times a week    Attends Religious Services: Never    Database administrator or Organizations: No    Attends Banker Meetings: Never    Marital Status: Widowed  Intimate Partner Violence: Not At Risk (11/20/2023)   Humiliation, Afraid, Rape, and Kick questionnaire    Fear of Current or Ex-Partner: No    Emotionally Abused: No    Physically Abused: No    Sexually Abused: No    Family History  Problem Relation Age of Onset   Hypercalcemia Daughter    Hypertension Daughter      Review of Systems  Constitutional: Negative.  Negative for chills and fever.  HENT: Negative.  Negative for congestion and sore throat.   Respiratory: Negative.  Negative for cough and shortness of breath.   Cardiovascular: Negative.  Negative for chest pain and palpitations.  Gastrointestinal:  Negative for abdominal pain, diarrhea, nausea and vomiting.  Skin:  Positive for rash.  Neurological: Negative.  Negative for dizziness and headaches.  All other systems reviewed and are negative.   Vitals:   11/20/23 1045  BP: 102/62  Pulse: 81  Temp: 98.1 F (36.7 C)  SpO2: 94%    Physical Exam Vitals reviewed.  Constitutional:      Appearance: Normal appearance.   HENT:     Head: Normocephalic.   Eyes:     Extraocular Movements: Extraocular movements intact.    Cardiovascular:     Rate and Rhythm: Normal rate.  Pulmonary:     Effort: Pulmonary effort is normal.   Skin:    General: Skin is warm and dry.     Findings: Rash present.     Comments: Lower extremities: No swelling.  No signs of cellulitis.  Several ecchymotic areas with hemosiderin skin pigmentation   Neurological:     Mental Status: She is alert and oriented to person, place, and time.   Psychiatric:        Mood and Affect: Mood normal.        Behavior: Behavior normal.      ASSESSMENT & PLAN: A total of 32 minutes was spent with the patient and counseling/coordination of care regarding preparing for this visit, review of most recent office visit notes, review of multiple chronic medical conditions under management, review of all medications, diagnosis of capillary fragility and hemosiderin deposits under the skin, management, prognosis, documentation and need for follow-up with PCP.  Problem List Items Addressed This Visit       Cardiovascular and Mediastinum   Capillary fragility (HCC) - Primary   Secondary to aging process and also long-term Eliquis  use Occasional bumps contributing to bruising No complications or concerns today.        Musculoskeletal and Integument   Hemosiderin pigmentation of skin   Secondary to chronic capillary leakage Chronic condition.  No complications        Other   Anticoagulated on Eliquis    For chronic persistent atrial fibrillation No complications      Patient Instructions  Health Maintenance After Age 63 After age 50, you are at a higher risk for certain long-term diseases and infections as well  as injuries from falls. Falls are a major cause of broken bones and head injuries in people who are older than age 19. Getting regular preventive care can help to keep you healthy and well. Preventive care includes getting regular  testing and making lifestyle changes as recommended by your health care provider. Talk with your health care provider about: Which screenings and tests you should have. A screening is a test that checks for a disease when you have no symptoms. A diet and exercise plan that is right for you. What should I know about screenings and tests to prevent falls? Screening and testing are the best ways to find a health problem early. Early diagnosis and treatment give you the best chance of managing medical conditions that are common after age 70. Certain conditions and lifestyle choices may make you more likely to have a fall. Your health care provider may recommend: Regular vision checks. Poor vision and conditions such as cataracts can make you more likely to have a fall. If you wear glasses, make sure to get your prescription updated if your vision changes. Medicine review. Work with your health care provider to regularly review all of the medicines you are taking, including over-the-counter medicines. Ask your health care provider about any side effects that may make you more likely to have a fall. Tell your health care provider if any medicines that you take make you feel dizzy or sleepy. Strength and balance checks. Your health care provider may recommend certain tests to check your strength and balance while standing, walking, or changing positions. Foot health exam. Foot pain and numbness, as well as not wearing proper footwear, can make you more likely to have a fall. Screenings, including: Osteoporosis screening. Osteoporosis is a condition that causes the bones to get weaker and break more easily. Blood pressure screening. Blood pressure changes and medicines to control blood pressure can make you feel dizzy. Depression screening. You may be more likely to have a fall if you have a fear of falling, feel depressed, or feel unable to do activities that you used to do. Alcohol  use screening. Using too  much alcohol  can affect your balance and may make you more likely to have a fall. Follow these instructions at home: Lifestyle Do not drink alcohol  if: Your health care provider tells you not to drink. If you drink alcohol : Limit how much you have to: 0-1 drink a day for women. 0-2 drinks a day for men. Know how much alcohol  is in your drink. In the [place] one drink equals one 12 oz bottle of beer (355 mL), one 5 oz glass of wine (148 mL), or one 1 oz glass of hard liquor (44 mL). Do not use any products that contain nicotine or tobacco. These products include cigarettes, chewing tobacco, and vaping devices, such as e-cigarettes. If you need help quitting, ask your health care provider. Activity  Follow a regular exercise program to stay fit. This will help you maintain your balance. Ask your health care provider what types of exercise are appropriate for you. If you need a cane or walker, use it as recommended by your health care provider. Wear supportive shoes that have nonskid soles. Safety  Remove any tripping hazards, such as rugs, cords, and clutter. Install safety equipment such as grab bars in bathrooms and safety rails on stairs. Keep rooms and walkways well-lit. General instructions Talk with your health care provider about your risks for falling. Tell your health care provider if: You  fall. Be sure to tell your health care provider about all falls, even ones that seem minor. You feel dizzy, tiredness (fatigue), or off-balance. Take over-the-counter and prescription medicines only as told by your health care provider. These include supplements. Eat a healthy diet and maintain a healthy weight. A healthy diet includes low-fat dairy products, low-fat (lean) meats, and fiber from whole grains, beans, and lots of fruits and vegetables. Stay current with your vaccines. Schedule regular health, dental, and eye exams. Summary Having a healthy lifestyle and getting preventive care can  help to protect your health and wellness after age 83. Screening and testing are the best way to find a health problem early and help you avoid having a fall. Early diagnosis and treatment give you the best chance for managing medical conditions that are more common for people who are older than age 70. Falls are a major cause of broken bones and head injuries in people who are older than age 32. Take precautions to prevent a fall at home. Work with your health care provider to learn what changes you can make to improve your health and wellness and to prevent falls. This information is not intended to replace advice given to you by your health care provider. Make sure you discuss any questions you have with your health care provider. Document Revised: 09/27/2020 Document Reviewed: 09/27/2020 Elsevier Patient Education  2024 Elsevier Inc.     Emil Schaumann, MD  Primary Care at [facility]

## 2024-02-20 ENCOUNTER — Ambulatory Visit: Admitting: Internal Medicine

## 2024-02-20 ENCOUNTER — Encounter: Payer: Self-pay | Admitting: Internal Medicine

## 2024-02-20 ENCOUNTER — Ambulatory Visit (INDEPENDENT_AMBULATORY_CARE_PROVIDER_SITE_OTHER)

## 2024-02-20 VITALS — BP 108/68 | HR 82 | Temp 98.6°F | Resp 16 | Ht 62.0 in | Wt 132.0 lb

## 2024-02-20 DIAGNOSIS — Z96651 Presence of right artificial knee joint: Secondary | ICD-10-CM | POA: Diagnosis not present

## 2024-02-20 DIAGNOSIS — S8781XA Crushing injury of right lower leg, initial encounter: Secondary | ICD-10-CM

## 2024-02-20 DIAGNOSIS — N1832 Chronic kidney disease, stage 3b: Secondary | ICD-10-CM

## 2024-02-20 DIAGNOSIS — I950 Idiopathic hypotension: Secondary | ICD-10-CM | POA: Diagnosis not present

## 2024-02-20 DIAGNOSIS — E034 Atrophy of thyroid (acquired): Secondary | ICD-10-CM

## 2024-02-20 DIAGNOSIS — I4821 Permanent atrial fibrillation: Secondary | ICD-10-CM | POA: Diagnosis not present

## 2024-02-20 DIAGNOSIS — Z515 Encounter for palliative care: Secondary | ICD-10-CM

## 2024-02-20 DIAGNOSIS — M48062 Spinal stenosis, lumbar region with neurogenic claudication: Secondary | ICD-10-CM | POA: Diagnosis not present

## 2024-02-20 DIAGNOSIS — M79661 Pain in right lower leg: Secondary | ICD-10-CM | POA: Diagnosis not present

## 2024-02-20 MED ORDER — TRAMADOL HCL 50 MG PO TABS: 50.0000 mg | ORAL_TABLET | Freq: Four times a day (QID) | ORAL | 2 refills | Status: DC | PRN

## 2024-02-20 NOTE — Progress Notes (Signed)
 Subjective:  Patient ID: Katrina Velez, female    DOB: 06/10/1925  Age: 89 y.o. MRN: [id number]  CC: Hypothyroidism and Atrial Fibrillation      HPI Katrina Velez presents for f/up ----  Discussed the use of AI scribe software for clinical note transcription with the patient, who gave verbal consent to proceed.  History of Present Illness Katrina Velez is a 89 year old female with atrial fibrillation who presents with leg swelling and bruising.  She has been experiencing swelling and bruising in her RLE, particularly after minor trauma such as bumping them. The swelling and bruising have persisted for a couple of years, and a recent incident involved banging her leg on the tub, resulting in a bruise and swelling without an open wound. The swelling decreases over time, but she experiences pain when weight is applied to the leg.  She has a history of atrial fibrillation. She takes metoprolol  and Cardizem  for this condition.  She reports a period of significant weight loss without dieting. Her appetite has decreased, and she does not eat as much as she used to. No trouble swallowing, abdominal pain, constipation, or diarrhea.  Her daughter is concerned about possible urinary tract infections causing confusion, as she experienced confusion recently, which resolved spontaneously. She is willing to provide a urine specimen for further evaluation.  Outpatient Medications Prior to Visit  Medication Sig Dispense Refill   apixaban  (ELIQUIS ) 5 MG TABS tablet Take 1 tablet (5 mg total) by mouth 2 (two) times daily. 180 tablet 3   Cholecalciferol (VITAMIN D-3 PO) Take 1 tablet by mouth 4 (four) times a week.     Cyanocobalamin  (VITAMIN B-12 PO) Take 1 tablet by mouth every morning.     diltiazem  (CARDIZEM  LA) 120 MG 24 hr tablet Take 1 tablet (120 mg total) by mouth 2 (two) times daily. 180 tablet 3   metoprolol  succinate (TOPROL -XL) 25 MG 24 hr tablet Take 1 tablet by mouth twice a day 180 tablet 3    metoprolol  tartrate (LOPRESSOR ) 25 MG tablet Take 0.5 tablets (12.5 mg total) by mouth 2 (two) times daily as needed (heart rate over 100). 60 tablet 3   mupirocin  ointment (BACTROBAN ) 2 % On leg wound w/dressing change qd or bid 30 g 1   Thiamine HCl (VITAMIN B1 PO) Take 1 tablet by mouth every morning.     torsemide  (DEMADEX ) 20 MG tablet Take 0.5 tablets (10 mg total) by mouth daily. Schedule an appt for further refills     SYNTHROID  125 MCG tablet TAKE 1 TABLET BY MOUTH EVERY DAY 90 tablet 1   traMADol  (ULTRAM ) 50 MG tablet TAKE 1 TABLET BY MOUTH EVERY 6 HOURS AS NEEDED. 120 tablet 2   No facility-administered medications prior to visit.    ROS Review of Systems  Constitutional:  Positive for fatigue. Negative for appetite change, chills, diaphoresis and fever.  HENT:  Positive for hearing loss.   Eyes: Negative.   Respiratory: Negative.  Negative for cough, chest tightness, shortness of breath and wheezing.   Cardiovascular:  Negative for chest pain, palpitations and leg swelling.  Gastrointestinal: Negative.  Negative for abdominal pain, constipation, diarrhea, nausea and vomiting.  Endocrine: Negative.   Genitourinary: Negative.  Negative for difficulty urinating, dysuria and hematuria.  Musculoskeletal:  Positive for arthralgias, back pain and gait problem. Negative for joint swelling and myalgias.  Skin: Negative.   Neurological:  Positive for weakness. Negative for dizziness.  Hematological:  Negative for adenopathy. Bruises/bleeds easily.  Psychiatric/Behavioral:  Positive for confusion and decreased concentration.     Objective:  BP 108/68 (BP Location: Left Arm, Patient Position: Sitting, Cuff Size: Normal)   Pulse 82   Temp 98.6 F (37 C) (Oral)   Resp 16   Ht 5' 2 (1.575 m)   Wt 132 lb (59.9 kg)   SpO2 99%   BMI 24.14 kg/m   BP Readings from Last 3 Encounters:  02/20/24 108/68  11/20/23 102/62  11/20/23 102/62    Wt Readings from Last 3 Encounters:   02/20/24 132 lb (59.9 kg)  11/20/23 133 lb (60.3 kg)  11/20/23 133 lb (60.3 kg)    Physical Exam Vitals reviewed.  Constitutional:      General: She is not in acute distress.    Appearance: She is ill-appearing. She is not toxic-appearing or diaphoretic.  HENT:     Mouth/Throat:     Mouth: Mucous membranes are moist.  Eyes:     General: No scleral icterus.    Conjunctiva/sclera: Conjunctivae normal.  Cardiovascular:     Rate and Rhythm: Normal rate. Rhythm irregularly irregular.     Heart sounds: No murmur heard.    No friction rub. No gallop.  Pulmonary:     Effort: Pulmonary effort is normal.     Breath sounds: No stridor. No wheezing, rhonchi or rales.  Abdominal:     General: Abdomen is flat.     Palpations: There is no mass.     Tenderness: There is no abdominal tenderness. There is no guarding.     Hernia: No hernia is present.  Musculoskeletal:        General: Swelling, tenderness and signs of injury present. No deformity.     Cervical back: Neck supple.     Right lower leg: No edema.     Left lower leg: No edema.  Lymphadenopathy:     Cervical: No cervical adenopathy.  Skin:    Coloration: Skin is not jaundiced.     Findings: Bruising present. No lesion or rash.  Neurological:     General: No focal deficit present.     Mental Status: She is alert.     Lab Results  Component Value Date   WBC 5.5 02/20/2024   HGB 12.1 02/20/2024   HCT 37.2 02/20/2024   PLT 227.0 02/20/2024   GLUCOSE 96 02/20/2024   CHOL 193 05/27/2014   TRIG 107 05/27/2014   HDL 66 05/27/2014   LDLCALC 106 (H) 05/27/2014   ALT 8 02/20/2024   AST 15 02/20/2024   NA 139 02/20/2024   K 4.1 02/20/2024   CL 103 02/20/2024   CREATININE 0.76 02/20/2024   BUN 19 02/20/2024   CO2 30 02/20/2024   TSH 0.42 02/20/2024   INR 1.6 (H) 11/16/2020    DG Tibia/Fibula Right Result Date: 02/20/2024 CLINICAL DATA:  Right lower leg pain after hitting his leg on a bathtub 1 day ago. EXAM: RIGHT  TIBIA AND FIBULA - 2 VIEW COMPARISON:  05/06/2023 FINDINGS: Stable right knee prosthesis. No fracture or dislocation. Extensive atheromatous arterial calcifications. IMPRESSION: No fracture. Electronically Signed   By: Elspeth Bathe M.D.   On: 02/20/2024 11:18     Assessment & Plan:  Crushing injury of right lower leg, initial encounter -     DG Tibia/Fibula Right; Future  Hypothyroidism due to acquired atrophy of thyroid - She complains of weight loss. Will lower her T4 dose. -     CBC with Differential/Platelet; Future -  TSH; Future -     Hepatic function panel; Future -     Levothyroxine  Sodium; Take 1 tablet (112 mcg total) by mouth daily.  Dispense: 90 tablet; Refill: 1  Idiopathic hypotension -     Basic metabolic panel with GFR; Future -     CBC with Differential/Platelet; Future -     TSH; Future -     Urinalysis, Routine w reflex microscopic; Future -     Hepatic function panel; Future -     Cortisol; Future  Spinal stenosis of lumbar region with neurogenic claudication -     traMADol  HCl; Take 1 tablet (50 mg total) by mouth every 6 (six) hours as needed.  Dispense: 120 tablet; Refill: 2  Encounter for palliative care involving management of pain -     traMADol  HCl; Take 1 tablet (50 mg total) by mouth every 6 (six) hours as needed.  Dispense: 120 tablet; Refill: 2  Stage 3b chronic kidney disease (HCC)     Follow-up: No follow-ups on file.  Debby Molt, MD

## 2024-02-21 ENCOUNTER — Ambulatory Visit: Payer: Self-pay | Admitting: Internal Medicine

## 2024-02-21 DIAGNOSIS — N1832 Chronic kidney disease, stage 3b: Secondary | ICD-10-CM | POA: Insufficient documentation

## 2024-02-21 DIAGNOSIS — I4821 Permanent atrial fibrillation: Secondary | ICD-10-CM | POA: Insufficient documentation

## 2024-02-21 LAB — CBC WITH DIFFERENTIAL/PLATELET
Basophils Absolute: 0 K/uL (ref 0.0–0.1)
Basophils Relative: 0.3 % (ref 0.0–3.0)
Eosinophils Absolute: 0.1 K/uL (ref 0.0–0.7)
Eosinophils Relative: 2.2 % (ref 0.0–5.0)
HCT: 37.2 % (ref 36.0–46.0)
Hemoglobin: 12.1 g/dL (ref 12.0–15.0)
Lymphocytes Relative: 16.1 % (ref 12.0–46.0)
Lymphs Abs: 0.9 K/uL (ref 0.7–4.0)
MCHC: 32.7 g/dL (ref 30.0–36.0)
MCV: 91 fl (ref 78.0–100.0)
Monocytes Absolute: 0.7 K/uL (ref 0.1–1.0)
Monocytes Relative: 13.1 % — ABNORMAL HIGH (ref 3.0–12.0)
Neutro Abs: 3.8 K/uL (ref 1.4–7.7)
Neutrophils Relative %: 68.3 % (ref 43.0–77.0)
Platelets: 227 K/uL (ref 150.0–400.0)
RBC: 4.08 Mil/uL (ref 3.87–5.11)
RDW: 14.4 % (ref 11.5–15.5)
WBC: 5.5 K/uL (ref 4.0–10.5)

## 2024-02-21 LAB — BASIC METABOLIC PANEL WITH GFR
BUN: 19 mg/dL (ref 6–23)
CO2: 30 meq/L (ref 19–32)
Calcium: 9.9 mg/dL (ref 8.4–10.5)
Chloride: 103 meq/L (ref 96–112)
Creatinine, Ser: 0.76 mg/dL (ref 0.40–1.20)
GFR: 66.08 mL/min (ref >60.00)
Glucose, Bld: 96 mg/dL (ref 70–99)
Potassium: 4.1 meq/L (ref 3.5–5.1)
Sodium: 139 meq/L (ref 135–145)

## 2024-02-21 LAB — HEPATIC FUNCTION PANEL
ALT: 8 U/L (ref 0–35)
AST: 15 U/L (ref 0–37)
Albumin: 4 g/dL (ref 3.5–5.2)
Alkaline Phosphatase: 65 U/L (ref 39–117)
Bilirubin, Direct: 0.2 mg/dL (ref 0.0–0.3)
Total Bilirubin: 0.7 mg/dL (ref 0.2–1.2)
Total Protein: 6.8 g/dL (ref 6.0–8.3)

## 2024-02-21 LAB — URINALYSIS, ROUTINE W REFLEX MICROSCOPIC
Bilirubin Urine: NEGATIVE
Hgb urine dipstick: NEGATIVE
Ketones, ur: NEGATIVE
Nitrite: NEGATIVE
RBC / HPF: NONE SEEN (ref 0–?)
Specific Gravity, Urine: 1.01 (ref 1.000–1.030)
Total Protein, Urine: NEGATIVE
Urine Glucose: NEGATIVE
Urobilinogen, UA: 1 (ref 0.0–1.0)
pH: 6 (ref 5.0–8.0)

## 2024-02-21 LAB — TSH: TSH: 0.42 u[IU]/mL (ref 0.35–5.50)

## 2024-02-21 LAB — CORTISOL: Cortisol, Plasma: 12.2 ug/dL

## 2024-02-21 MED ORDER — LEVOTHYROXINE SODIUM 112 MCG PO TABS: 112.0000 ug | ORAL_TABLET | Freq: Every day | ORAL | 1 refills | Status: DC

## 2024-03-15 ENCOUNTER — Other Ambulatory Visit (HOSPITAL_COMMUNITY): Payer: Self-pay | Admitting: Internal Medicine

## 2024-03-30 ENCOUNTER — Other Ambulatory Visit (HOSPITAL_COMMUNITY): Payer: Self-pay | Admitting: Internal Medicine

## 2024-03-30 DIAGNOSIS — I4821 Permanent atrial fibrillation: Secondary | ICD-10-CM

## 2024-04-10 ENCOUNTER — Encounter (HOSPITAL_COMMUNITY): Payer: Self-pay | Admitting: Internal Medicine

## 2024-04-10 ENCOUNTER — Ambulatory Visit (HOSPITAL_COMMUNITY)
Admission: RE | Admit: 2024-04-10 | Discharge: 2024-04-10 | Disposition: A | Discharge status: Home / Self Care | Source: Ambulatory Visit | Attending: Internal Medicine | Admitting: Internal Medicine

## 2024-04-10 VITALS — BP 112/68 | HR 82 | Ht 62.0 in | Wt 126.2 lb

## 2024-04-10 DIAGNOSIS — D6869 Other thrombophilia: Secondary | ICD-10-CM | POA: Insufficient documentation

## 2024-04-10 DIAGNOSIS — I4821 Permanent atrial fibrillation: Secondary | ICD-10-CM | POA: Diagnosis not present

## 2024-04-10 MED ORDER — APIXABAN 2.5 MG PO TABS: 2.5000 mg | ORAL_TABLET | Freq: Two times a day (BID) | ORAL | 6 refills | Status: AC

## 2024-04-10 NOTE — Patient Instructions (Signed)
Decrease Eliquis 2.5mg twice a day.

## 2024-04-10 NOTE — Progress Notes (Signed)
 Patient ID: Katrina Velez, female   DOB: [birth year], 89 y.o.   MRN: [id number]       Primary Care Physician: Joshua Debby CROME, MD Referring Physician: Dr. Kelsie Sharyne Steedman is a 89 y.o. female with a h/o afib that was loaded on amiodarone  and successfully cardioverted 4/13, but with ERAF. She did not see that much difference in SR and had some thyroid  concerns already on replacement, so decision was made to stop amiodarone  and aim for rate control. HR has been staying below 100 at home. No issues with anticoagulant.   Follow-up A-fib clinic, 04/10/2024.  Patient is in permanent A-fib and here for management of rate control.  She has noted overall well-controlled heart rates since last visit.  She takes diltiazem  and Toprol  for rate control.  She is taking a torsemide  tablet daily for diuresis.  No bleeding issues on Eliquis .  Today, she denies symptoms of palpitations, chest pain, shortness of breath, orthopnea, PND, lower extremity edema, dizziness, presyncope, syncope, or neurologic sequela. The patient is tolerating medications without difficulties and is otherwise without complaint today.   Past Medical History:  Diagnosis Date   A-fib (HCC) 05/2015   HOSPITALIZED    Arthritis    Cataract    Hypothyroidism    Permanent atrial fibrillation (HCC)    Thyroid  disease    Past Surgical History:  Procedure Laterality Date   CARDIOVERSION N/A 06/08/2015   Procedure: CARDIOVERSION;  Surgeon: Oneil JAYSON Parchment, MD;  [facility] END[facility]COPY;  Service: Cardiovascular;  Laterality: N/A;   CARDIOVERSION N/A 09/02/2015   Procedure: CARDIOVERSION;  Surgeon: Pearla DELENA Rout, MD;  Location: [facility] ENDOSCOPY;  Service: Cardiovascular;  Laterality: N/A;   CATARACT EXTRACTION, BILATERAL Bilateral 2010   JOINT REPLACEMENT     KNEE SURGERY  2002   ORIF FEMUR FRACTURE Right 11/18/2020   Procedure: OPEN REDUCTION INTERNAL FIXATION (ORIF) DISTAL FEMUR FRACTURE;  Surgeon: Kendal Franky SQUIBB, MD;  Location: [facility] OR;   Service: Orthopedics;  Laterality: Right;   right hip replacement     RIGHT KNEE REPLACEMENT  2011   TEE WITHOUT CARDIOVERSION N/A 06/08/2015   Procedure: TRANSESOPHAGEAL ECHOCARDIOGRAM (TEE);  Surgeon: Oneil JAYSON Parchment, MD;  Location: [facility] ENDOSCOPY;  Service: Cardiovascular;  Laterality: N/A;   TOTAL HIP ARTHROPLASTY Right 2011   TOTAL SHOULDER REPLACEMENT Right 2003   TOTAL SHOULDER REPLACEMENT Left 2007   WRIST SURGERY Bilateral 2008   both wrists    Current Outpatient Medications  Medication Sig Dispense Refill   Cholecalciferol (VITAMIN D-3 PO) Take 1 tablet by mouth 4 (four) times a week.     Cyanocobalamin  (VITAMIN B-12 PO) Take 1 tablet by mouth every morning.     diltiazem  (CARDIZEM  LA) 120 MG 24 hr tablet TAKE 1 TABLET BY MOUTH 2 TIMES DAILY. 180 tablet 3   levothyroxine  (SYNTHROID ) 112 MCG tablet Take 1 tablet (112 mcg total) by mouth daily. (Patient taking differently: Take 125 mcg by mouth daily.) 90 tablet 1   metoprolol  succinate (TOPROL -XL) 25 MG 24 hr tablet TAKE 1 TABLET BY MOUTH TWICE A DAY 180 tablet 3   metoprolol  tartrate (LOPRESSOR ) 25 MG tablet TAKE 0.5 TABLET (12.5 MG TOTAL) BY MOUTH 2 (TWO) TIMES DAILY AS NEEDED (HEART RATE OVER 100). 90 tablet 2   mupirocin  ointment (BACTROBAN ) 2 % On leg wound w/dressing change qd or bid 30 g 1   Thiamine HCl (VITAMIN B1 PO) Take 1 tablet by mouth every morning.     torsemide  (DEMADEX ) 20 MG  tablet Take 0.5 tablets (10 mg total) by mouth daily. Schedule an appt for further refills     traMADol  (ULTRAM ) 50 MG tablet Take 1 tablet (50 mg total) by mouth every 6 (six) hours as needed. 120 tablet 2   apixaban  (ELIQUIS ) 2.5 MG TABS tablet Take 1 tablet (2.5 mg total) by mouth 2 (two) times daily. 60 tablet 6   No current facility-administered medications for this encounter.    Allergies  Allergen Reactions   Banana Nausea Only    All banana products also   Penicillins Hives   ROS- All systems are reviewed and negative except as  per the HPI above  Physical Exam: Vitals:   04/10/24 1139  BP: 112/68  Pulse: 82  Weight: 57.2 kg  Height: 5' 2 (1.575 m)    GEN- The patient is well appearing, alert and oriented x 3 today.   Neck - no JVD or carotid bruit noted Lungs- Clear to ausculation bilaterally, normal work of breathing Heart- Irregular rate and rhythm, no murmurs, rubs or gallops, PMI not laterally displaced Extremities- no clubbing, cyanosis, or edema Skin - no rash or ecchymosis noted   EKG-  Vent. rate 82 BPM PR interval * ms QRS duration 82 ms QT/QTcB 388/453 ms P-R-T axes * 79 73 Atrial fibrillation Nonspecific ST and T wave abnormality Abnormal ECG When compared with ECG of 10-Oct-2023 11:42, No significant change was found  Epic records reviewed   Assessment and Plan: Permanent afib  Patient is in permanent A-fib.  She has noted overall well-controlled heart rate.  Continue diltiazem  120 mg twice daily and Toprol  25 mg twice daily.  Can use Lopressor  12.5 mg twice daily as needed.  Okay to continue torsemide  10 mg daily; weight similar to previous visit which is reassuring.   We will continue with office visits every 6 months per patient request.   Decrease Eliquis  to 2.5 mg BID given weight in office is below 60 kg with clothes and shoes on.    Follow up 6 months Afib clinic.   Fairy Heinrich, PA-C [facility] [address] [place], [place] [address] [phone]

## 2024-05-10 ENCOUNTER — Other Ambulatory Visit: Payer: Self-pay | Admitting: Internal Medicine

## 2024-05-10 DIAGNOSIS — R600 Localized edema: Secondary | ICD-10-CM

## 2024-05-17 ENCOUNTER — Other Ambulatory Visit: Payer: Self-pay

## 2024-05-17 ENCOUNTER — Ambulatory Visit (HOSPITAL_COMMUNITY)

## 2024-05-17 ENCOUNTER — Telehealth (HOSPITAL_COMMUNITY): Payer: Self-pay

## 2024-05-17 ENCOUNTER — Ambulatory Visit (HOSPITAL_COMMUNITY): Admission: EM | Admit: 2024-05-17 | Discharge: 2024-05-17 | Disposition: A | Discharge status: Home / Self Care

## 2024-05-17 ENCOUNTER — Encounter (HOSPITAL_COMMUNITY): Payer: Self-pay | Admitting: Emergency Medicine

## 2024-05-17 DIAGNOSIS — R051 Acute cough: Secondary | ICD-10-CM | POA: Diagnosis not present

## 2024-05-17 MED ORDER — DOXYCYCLINE HYCLATE 100 MG PO CAPS: 100.0000 mg | ORAL_CAPSULE | Freq: Two times a day (BID) | ORAL | 0 refills | Status: AC

## 2024-05-17 NOTE — ED Provider Notes (Addendum)
 " [facility]    CSN: [id number] Arrival date & time: 05/17/24  1103      History   Chief Complaint Chief Complaint  Patient presents with   Cough    HPI Katrina Velez is a 89 y.o. female.   This 89 year old female is being seen for complaints of cough for 2 days.  Caregiver at bedside reports patient appeared short of breath this morning patient endorsed same, 911 was called.  Upon EMS arrival, vitals were within normal limits.  EMS staff recommended patient be seen in urgent care.  She is found sitting up in wheelchair.  No acute distress or discomfort noted.  Caregiver reports patient has had Coricidin cough syrup with no improvement of symptoms.  Patient and caregiver report cough is worse when laying down.  Patient complains of a runny nose.  She intermittently takes Benadryl for rhinorrhea.  Her last dose of Benadryl was yesterday.  Daughter denies known sick contacts.  She reports she had a similar cough approximately 2 weeks ago, but it resolved on its own.  Patient denies headache, dizziness.  Denies sore throat.  Denies chest pain.  Denies shortness of breath at this time.  Denies abdominal pain, nausea, vomiting, diarrhea.  Denies urinary symptoms including urgency, frequency, dysuria.   Cough Cough characteristics:  Non-productive Duration:  2 days Worsened by:  Lying down Associated symptoms: rhinorrhea and sore throat   Associated symptoms: no chest pain, no chills, no ear pain, no fever, no headaches and no shortness of breath     Past Medical History:  Diagnosis Date   A-fib (HCC) 05/2015   HOSPITALIZED    Arthritis    Cataract    Hypothyroidism    Permanent atrial fibrillation (HCC)    Thyroid  disease     Patient Active Problem List   Diagnosis Date Noted   Stage 3b chronic kidney disease (HCC) 02/21/2024   Permanent atrial fibrillation (HCC) 02/21/2024   Crushing injury of right lower leg 02/20/2024   Idiopathic hypotension 02/20/2024    Anticoagulated on Eliquis  11/20/2023   Hypercoagulable state due to permanent atrial fibrillation (HCC) 10/04/2022   Encounter for palliative care involving management of pain 08/15/2021   Primary osteoarthritis of left hip 08/15/2021   Spinal stenosis of lumbar region with neurogenic claudication 05/11/2017   Seasonal allergic rhinitis due to pollen 10/04/2016   High risk medication use 10/04/2016   Hypothyroidism post removal of goiter 01/31/2012    Past Surgical History:  Procedure Laterality Date   CARDIOVERSION N/A 06/08/2015   Procedure: CARDIOVERSION;  Surgeon: Oneil JAYSON Parchment, MD;  Location[facility] ENDOSCOPY;  Service: Cardiovascular;  Laterality: N/A;   CARDIOVERSION N/A 09/02/2015   Procedure: CARDIOVERSION;  Surgeon: Pearla DELENA Rout, MD;  Location: [facility] ENDOSCOPY;  Service: Cardiovascular;  Laterality: N/A;   CATARACT EXTRACTION, BILATERAL Bilateral 2010   JOINT REPLACEMENT     KNEE SURGERY  2002   ORIF FEMUR FRACTURE Right 11/18/2020   Procedure: OPEN REDUCTION INTERNAL FIXATION (ORIF) DISTAL FEMUR FRACTURE;  Surgeon: Kendal Franky SQUIBB, MD;  Location: [facility] OR;  Service: Orthopedics;  Laterality: Right;   right hip replacement     RIGHT KNEE REPLACEMENT  2011   TEE WITHOUT CARDIOVERSION N/A 06/08/2015   Procedure: TRANSESOPHAGEAL ECHOCARDIOGRAM (TEE);  Surgeon: Oneil JAYSON Parchment, MD;  Location: [facility] ENDOSCOPY;  Service: Cardiovascular;  Laterality: N/A;   TOTAL HIP ARTHROPLASTY Right 2011   TOTAL SHOULDER REPLACEMENT Right 2003   TOTAL SHOULDER REPLACEMENT Left 2007   WRIST  SURGERY Bilateral 2008   both wrists    OB History   No obstetric history on file.      Home Medications    Prior to Admission medications  Medication Sig Start Date End Date Taking? Authorizing Provider  doxycycline  (VIBRAMYCIN ) 100 MG capsule Take 1 capsule (100 mg total) by mouth 2 (two) times daily for 7 days. 05/17/24 05/24/24 Yes Jaysa Kise C, FNP  apixaban  (ELIQUIS ) 2.5 MG TABS tablet Take 1 tablet (2.5  mg total) by mouth 2 (two) times daily. 04/10/24   Terra Fairy PARAS, PA-C  Cholecalciferol (VITAMIN D-3 PO) Take 1 tablet by mouth 4 (four) times a week.    [provider]  Cyanocobalamin  (VITAMIN B-12 PO) Take 1 tablet by mouth every morning.    [provider]  diltiazem  (CARDIZEM  LA) 120 MG 24 hr tablet TAKE 1 TABLET BY MOUTH 2 TIMES DAILY. 03/31/24   Terra Fairy PARAS, PA-C  levothyroxine  (SYNTHROID ) 112 MCG tablet Take 1 tablet (112 mcg total) by mouth daily. Patient taking differently: Take 125 mcg by mouth daily. 02/21/24   Joshua Debby CROME, MD  metoprolol  succinate (TOPROL -XL) 25 MG 24 hr tablet TAKE 1 TABLET BY MOUTH TWICE A DAY 03/31/24   Terra Fairy PARAS, PA-C  metoprolol  tartrate (LOPRESSOR ) 25 MG tablet TAKE 0.5 TABLET (12.5 MG TOTAL) BY MOUTH 2 (TWO) TIMES DAILY AS NEEDED (HEART RATE OVER 100). 03/31/24   Terra Fairy PARAS, PA-C  mupirocin  ointment (BACTROBAN ) 2 % On leg wound w/dressing change qd or bid 05/22/23   Plotnikov, Aleksei V, MD  Thiamine HCl (VITAMIN B1 PO) Take 1 tablet by mouth every morning.    [provider]  torsemide  (DEMADEX ) 20 MG tablet TAKE 1 TABLET (20 MG TOTAL) BY MOUTH DAILY. SCHEDULE AN APPT FOR FURTHER REFILLS 05/12/24   Joshua Debby CROME, MD  traMADol  (ULTRAM ) 50 MG tablet Take 1 tablet (50 mg total) by mouth every 6 (six) hours as needed. 02/20/24   Joshua Debby CROME, MD    Family History Family History  Problem Relation Age of Onset   Hypercalcemia Daughter    Hypertension Daughter     Social History Social History[1]   Allergies   Banana and Penicillins   Review of Systems Review of Systems  Constitutional:  Negative for activity change, appetite change, chills and fever.  HENT:  Positive for rhinorrhea and sore throat. Negative for congestion and ear pain.   Respiratory:  Positive for cough. Negative for shortness of breath.   Cardiovascular:  Negative for chest pain.  Gastrointestinal:  Negative for abdominal pain,  diarrhea, nausea and vomiting.  Genitourinary:  Negative for difficulty urinating, dysuria, frequency and urgency.  Skin:  Negative for color change.  Neurological:  Negative for dizziness and headaches.  All other systems reviewed and are negative.    Physical Exam Triage Vital Signs ED Triage Vitals  Encounter Vitals Group     BP 05/17/24 1509 114/72     Girls Systolic BP Percentile --      Girls Diastolic BP Percentile --      Boys Systolic BP Percentile --      Boys Diastolic BP Percentile --      Pulse Rate 05/17/24 1107 60     Resp 05/17/24 1509 20     Temp 05/17/24 1509 98.7 F (37.1 C)     Temp Source 05/17/24 1509 Oral     SpO2 05/17/24 1107 97 %     Weight --  Height --      Head Circumference --      Peak Flow --      Pain Score 05/17/24 1505 0     Pain Loc --      Pain Education --      Exclude from Growth Chart --    No data found.  Updated Vital Signs BP 114/72 (BP Location: Right Arm)   Pulse 84   Temp 98.7 F (37.1 C) (Oral)   Resp 20   SpO2 92%   Visual Acuity Right Eye Distance:   Left Eye Distance:   Bilateral Distance:    Right Eye Near:   Left Eye Near:    Bilateral Near:     Physical Exam Vitals and nursing note reviewed.  Constitutional:      General: She is not in acute distress.    Appearance: She is well-developed. She is not ill-appearing or toxic-appearing.     Comments: Pleasant female appearing stated age found sitting in wheelchair in no acute distress.  HENT:     Head: Normocephalic and atraumatic.     Right Ear: External ear normal.     Left Ear: External ear normal.     Nose: Rhinorrhea present.     Mouth/Throat:     Lips: Pink.     Mouth: Mucous membranes are moist.     Pharynx: Postnasal drip present. No oropharyngeal exudate or posterior oropharyngeal erythema.  Eyes:     Conjunctiva/sclera: Conjunctivae normal.  Cardiovascular:     Rate and Rhythm: Normal rate and regular rhythm.     Heart sounds: Normal  heart sounds. No murmur heard. Pulmonary:     Effort: Pulmonary effort is normal. No respiratory distress.     Breath sounds: Decreased breath sounds present.  Skin:    General: Skin is warm and dry.     Capillary Refill: Capillary refill takes less than 2 seconds.  Neurological:     Mental Status: She is alert.  Psychiatric:        Mood and Affect: Mood normal.      UC Treatments / Results  Labs (all labs ordered are listed, but only abnormal results are displayed) Labs Reviewed - No data to display  EKG   Radiology DG Chest 2 View Result Date: 05/17/2024 CLINICAL DATA:  Cough and shortness of breath. EXAM: CHEST - 2 VIEW COMPARISON:  02/21/2023. FINDINGS: The heart is enlarged and the mediastinal contour is within normal limits. There is atherosclerotic calcification of the aorta. Interstitial prominence is noted bilaterally. There is a mild atelectasis at the lung bases. No effusion or pneumothorax is seen. Bilateral shoulder arthroplasty changes are present. No acute osseous abnormality. IMPRESSION: Interstitial prominence bilaterally, possible edema versus infection. Electronically Signed   By: Leita Birmingham M.D.   On: 05/17/2024 16:58    Procedures Procedures (including critical care time)  Medications Ordered in UC Medications - No data to display  Initial Impression / Assessment and Plan / UC Course  I have reviewed the triage vital signs and the nursing notes.  Pertinent labs & imaging results that were available during my care of the patient were reviewed by me and considered in my medical decision making (see chart for details).     Vitals and triage reviewed, patient is hemodynamically stable.  She has decreased breath sounds, chest x-ray obtained.  Nursing reports family wants to leave.  They are asking that we call them with results.  My review of the x-ray  shows no acute findings.  Will call patient with final radiology read.  Patient's vital signs are stable.   Discharge instructions will be dictated.  If patient has MyChart, she will be able to review instructions at that time.  After patient discharge, final read came back on chest x-ray.  Significant for interstitial prominence bilaterally, possible edema versus infection.  Prescription for doxycycline  sent to pharmacy.  Nursing called patient and updated.  She is advised to follow-up with her PCP on Monday.  She is advised if any new or worsening symptoms including increased shortness of breath, unable to keep food or fluids down to go to the emergency room. Final Clinical Impressions(s) / UC Diagnoses   Final diagnoses:  Acute cough     Discharge Instructions      You have a viral illness which will improve on its own with rest, fluids, and medications to help with your symptoms.  Tylenol , coricidan, and saline nasal sprays may help relieve symptoms.   Cetirizine (Zyrtec) can help relieve nasal drainage.  Take 1 tablet daily.  Two teaspoons of honey in 1 cup of warm water every 4-6 hours may help with throat pains.  Humidifier in room at nighttime may help soothe cough (clean well daily).   For chest pain, shortness of breath, inability to keep food or fluids down without vomiting, fever that does not respond to tylenol  or motrin, or any other severe symptoms, please go to the ER for further evaluation. Return to urgent care as needed, otherwise follow-up with PCP.       ED Prescriptions     Medication Sig Dispense Auth. Provider   doxycycline  (VIBRAMYCIN ) 100 MG capsule Take 1 capsule (100 mg total) by mouth 2 (two) times daily for 7 days. 14 capsule Kween Bacorn C, FNP      PDMP not reviewed this encounter.    Lennice Jon BROCKS, FNP 05/17/24 1615    Lennice Jon BROCKS, FNP 05/17/24 1718     [1]  Social History Tobacco Use   Smoking status: Former    Current packs/day: 0.00    Average packs/day: 1 pack/day for 65.0 years (65.0 ttl pk-yrs)    Types: Cigarettes    Start  date: 05/27/1944    Quit date: 05/28/2011    Years since quitting: 12.9   Smokeless tobacco: Never   Tobacco comments:    Former smoker 04/10/24  Vaping Use   Vaping status: Some Days   Substances: Nicotine  Substance Use Topics   Alcohol  use: Not Currently   Drug use: No     Lennice Jon BROCKS, FNP 05/17/24 1722  "

## 2024-05-17 NOTE — ED Triage Notes (Signed)
 Complains of cough, non-productive.  Has had a cough for 2 days.  This morning , patient appeared sob and complained of this.  Patient's caregiver called 911, did not have any particular findings and suggested patient come to urgent care  Has had coricidin cough syrup.  Caregiver thinks patient would benefit from a strong cough medicine

## 2024-05-17 NOTE — Discharge Instructions (Addendum)
 You have a viral illness which will improve on its own with rest, fluids, and medications to help with your symptoms.  Tylenol , coricidan, and saline nasal sprays may help relieve symptoms.   Cetirizine (Zyrtec) can help relieve nasal drainage.  Take 1 tablet daily.  Two teaspoons of honey in 1 cup of warm water every 4-6 hours may help with throat pains.  Humidifier in room at nighttime may help soothe cough (clean well daily).   For chest pain, shortness of breath, inability to keep food or fluids down without vomiting, fever that does not respond to tylenol  or motrin, or any other severe symptoms, please go to the ER for further evaluation. Return to urgent care as needed, otherwise follow-up with PCP.

## 2024-05-17 NOTE — ED Notes (Signed)
 Called patient and daughter to discuss most recent test results. Advised patient to start doxycycline  that was sent to her pharmacy. Patient should follow-up with PCP on Monday.

## 2024-05-17 NOTE — ED Notes (Signed)
 Patient did not want to wait for test results. Advised patient we will call her with x-ray results.

## 2024-05-17 NOTE — ED Notes (Signed)
 Have pressed with questions about this episode of sob.  Patient sat up on side of the bed and felt sob,  at that time, caregiver adds patient commented on tightness in chest.  Currently patient denies either of these complaints.

## 2024-05-18 ENCOUNTER — Other Ambulatory Visit: Payer: Self-pay | Admitting: Internal Medicine

## 2024-05-18 DIAGNOSIS — E034 Atrophy of thyroid (acquired): Secondary | ICD-10-CM

## 2024-05-19 ENCOUNTER — Ambulatory Visit (HOSPITAL_COMMUNITY): Payer: Self-pay

## 2024-05-26 ENCOUNTER — Encounter: Payer: Self-pay | Admitting: Internal Medicine

## 2024-05-26 ENCOUNTER — Ambulatory Visit (INDEPENDENT_AMBULATORY_CARE_PROVIDER_SITE_OTHER): Admitting: Internal Medicine

## 2024-05-26 VITALS — BP 108/64 | HR 64 | Temp 98.6°F | Resp 16 | Ht 62.0 in

## 2024-05-26 DIAGNOSIS — I4821 Permanent atrial fibrillation: Secondary | ICD-10-CM

## 2024-05-26 DIAGNOSIS — E034 Atrophy of thyroid (acquired): Secondary | ICD-10-CM

## 2024-05-26 DIAGNOSIS — Z515 Encounter for palliative care: Secondary | ICD-10-CM | POA: Diagnosis not present

## 2024-05-26 DIAGNOSIS — M48062 Spinal stenosis, lumbar region with neurogenic claudication: Secondary | ICD-10-CM | POA: Diagnosis not present

## 2024-05-26 MED ORDER — TRAMADOL HCL 50 MG PO TABS: 50.0000 mg | ORAL_TABLET | Freq: Four times a day (QID) | ORAL | 3 refills | Status: AC | PRN

## 2024-05-26 MED ORDER — LEVOTHYROXINE SODIUM 125 MCG PO TABS: 125.0000 ug | ORAL_TABLET | Freq: Every day | ORAL | 1 refills | Status: AC

## 2024-05-26 MED ORDER — DILTIAZEM HCL ER 120 MG PO TB24: 120.0000 mg | ORAL_TABLET | Freq: Two times a day (BID) | ORAL | 1 refills | Status: AC

## 2024-05-26 NOTE — Patient Instructions (Signed)

## 2024-05-26 NOTE — Progress Notes (Signed)
 "  Subjective:  Patient ID: Katrina Velez, female    DOB: Dec 27, 1925  Age: 89 y.o. MRN: [id number]  CC: Atrial Fibrillation and Hypothyroidism   HPI Katrina Velez presents for f/up ---        Discussed the use of AI scribe software for clinical note transcription with the patient, who gave verbal consent to proceed.  History of Present Illness Katrina Velez is a 89 year old female with atrial fibrillation who presents with shortness of breath and leg pain.  She experienced shortness of breath, leading her caregiver to call an ambulance. The ambulance crew assessed her vitals, which were normal, but recommended a visit to urgent care. At urgent care, a chest x-ray was performed and she was told there was some edema in her lungs. She was prescribed a seven-day course of antibiotics.  She has a history of atrial fibrillation and is currently taking Eliquis . She also takes Torsemide  for fluid management but had not been taking it regularly over the holidays, which her caregiver suspects may have contributed to her symptoms of shortness of breath and edema.  She has chronic leg pain, persisting for over five years, described as very sensitive to touch, with thin and bruised skin. Her caregiver notes that the bruising has improved compared to previous observations. Despite wearing socks for protection, the leg remains sensitive and painful.  No frequent coughing, stating it occurs only 'once in a while.'     Outpatient Medications Prior to Visit  Medication Sig Dispense Refill   apixaban  (ELIQUIS ) 2.5 MG TABS tablet Take 1 tablet (2.5 mg total) by mouth 2 (two) times daily. 60 tablet 6   Cholecalciferol (VITAMIN D-3 PO) Take 1 tablet by mouth 4 (four) times a week.     Cyanocobalamin  (VITAMIN B-12 PO) Take 1 tablet by mouth every morning.     metoprolol  succinate (TOPROL -XL) 25 MG 24 hr tablet TAKE 1 TABLET BY MOUTH TWICE A DAY 180 tablet 3   metoprolol  tartrate (LOPRESSOR ) 25 MG tablet  TAKE 0.5 TABLET (12.5 MG TOTAL) BY MOUTH 2 (TWO) TIMES DAILY AS NEEDED (HEART RATE OVER 100). 90 tablet 2   mupirocin  ointment (BACTROBAN ) 2 % On leg wound w/dressing change qd or bid 30 g 1   Thiamine HCl (VITAMIN B1 PO) Take 1 tablet by mouth every morning.     torsemide  (DEMADEX ) 20 MG tablet TAKE 1 TABLET (20 MG TOTAL) BY MOUTH DAILY. SCHEDULE AN APPT FOR FURTHER REFILLS 90 tablet 0   diltiazem  (CARDIZEM  LA) 120 MG 24 hr tablet TAKE 1 TABLET BY MOUTH 2 TIMES DAILY. 180 tablet 3   levothyroxine  (SYNTHROID ) 112 MCG tablet Take 1 tablet (112 mcg total) by mouth daily. (Patient taking differently: Take 125 mcg by mouth daily.) 90 tablet 1   traMADol  (ULTRAM ) 50 MG tablet Take 1 tablet (50 mg total) by mouth every 6 (six) hours as needed. 120 tablet 2   No facility-administered medications prior to visit.    ROS Review of Systems  Constitutional:  Positive for fatigue. Negative for appetite change, chills, diaphoresis and unexpected weight change.  HENT:  Positive for hearing loss. Negative for nosebleeds.   Respiratory:  Negative for cough, chest tightness, shortness of breath and wheezing.   Cardiovascular:  Negative for chest pain, palpitations and leg swelling.  Gastrointestinal: Negative.  Negative for abdominal pain, blood in stool, constipation, diarrhea, nausea and vomiting.  Genitourinary: Negative.  Negative for difficulty urinating.  Musculoskeletal:  Positive for arthralgias, back pain, gait problem  and myalgias. Negative for joint swelling.  Skin: Negative.  Negative for wound.  Neurological:  Negative for dizziness and light-headedness.  Hematological:  Negative for adenopathy. Bruises/bleeds easily.  Psychiatric/Behavioral: Negative.      Objective:  BP 108/64 (BP Location: Left Arm, Patient Position: Sitting, Cuff Size: Normal)   Pulse 64   Temp 98.6 F (37 C) (Oral)   Resp 16   Ht 5' 2 (1.575 m)   SpO2 95%   BMI 23.08 kg/m   BP Readings from Last 3 Encounters:   05/26/24 108/64  05/17/24 114/72  04/10/24 112/68    Wt Readings from Last 3 Encounters:  04/10/24 126 lb 3.2 oz (57.2 kg)  02/20/24 132 lb (59.9 kg)  11/20/23 133 lb (60.3 kg)    Physical Exam Vitals reviewed.  Constitutional:      General: She is not in acute distress.    Appearance: She is not toxic-appearing or diaphoretic.  HENT:     Mouth/Throat:     Mouth: Mucous membranes are moist.  Eyes:     General: No scleral icterus.    Conjunctiva/sclera: Conjunctivae normal.  Cardiovascular:     Rate and Rhythm: Normal rate. Rhythm irregularly irregular.     Heart sounds: No murmur heard.    No friction rub. No gallop.  Pulmonary:     Effort: Pulmonary effort is normal.     Breath sounds: No stridor. No wheezing, rhonchi or rales.  Abdominal:     General: Abdomen is flat.     Palpations: There is no mass.     Tenderness: There is no abdominal tenderness. There is no guarding.     Hernia: No hernia is present.  Musculoskeletal:        General: Normal range of motion.     Cervical back: Neck supple.     Right lower leg: No edema.     Left lower leg: No edema.  Lymphadenopathy:     Cervical: No cervical adenopathy.  Skin:    General: Skin is dry.     Coloration: Skin is not jaundiced.     Findings: Bruising present.  Neurological:     General: No focal deficit present.     Mental Status: She is alert.  Psychiatric:        Mood and Affect: Mood normal.        Behavior: Behavior normal.     Lab Results  Component Value Date   WBC 5.5 02/20/2024   HGB 12.1 02/20/2024   HCT 37.2 02/20/2024   PLT 227.0 02/20/2024   GLUCOSE 96 02/20/2024   CHOL 193 05/27/2014   TRIG 107 05/27/2014   HDL 66 05/27/2014   LDLCALC 106 (H) 05/27/2014   ALT 8 02/20/2024   AST 15 02/20/2024   NA 139 02/20/2024   K 4.1 02/20/2024   CL 103 02/20/2024   CREATININE 0.76 02/20/2024   BUN 19 02/20/2024   CO2 30 02/20/2024   TSH 0.42 02/20/2024   INR 1.6 (H) 11/16/2020    DG  Chest 2 View Result Date: 05/17/2024 CLINICAL DATA:  Cough and shortness of breath. EXAM: CHEST - 2 VIEW COMPARISON:  02/21/2023. FINDINGS: The heart is enlarged and the mediastinal contour is within normal limits. There is atherosclerotic calcification of the aorta. Interstitial prominence is noted bilaterally. There is a mild atelectasis at the lung bases. No effusion or pneumothorax is seen. Bilateral shoulder arthroplasty changes are present. No acute osseous abnormality. IMPRESSION: Interstitial prominence bilaterally, possible edema versus  infection. Electronically Signed   By: Leita Birmingham M.D.   On: 05/17/2024 16:58    Assessment & Plan:   Hypothyroidism due to acquired atrophy of thyroid - She is euthyroid. -     Levothyroxine  Sodium; Take 1 tablet (125 mcg total) by mouth daily.  Dispense: 90 tablet; Refill: 1  Permanent atrial fibrillation (HCC)- She has good rate control. -     dilTIAZem  HCl ER; Take 1 tablet (120 mg total) by mouth 2 (two) times daily.  Dispense: 180 tablet; Refill: 1  Spinal stenosis of lumbar region with neurogenic claudication -     traMADol  HCl; Take 1 tablet (50 mg total) by mouth every 6 (six) hours as needed.  Dispense: 120 tablet; Refill: 3  Encounter for palliative care involving management of pain -     traMADol  HCl; Take 1 tablet (50 mg total) by mouth every 6 (six) hours as needed.  Dispense: 120 tablet; Refill: 3     Follow-up: Return in about 6 months (around 11/23/2024).  Debby Molt, MD "

## 2024-10-17 ENCOUNTER — Ambulatory Visit (HOSPITAL_COMMUNITY): Admitting: Internal Medicine

## 2024-11-27 ENCOUNTER — Ambulatory Visit
